# Patient Record
Sex: Female | Born: 1935 | Race: White | Hispanic: No | Marital: Married | Smoking: Former smoker
Health system: Southern US, Community
[De-identification: ages and names within clinical notes are randomized; demographics above are authoritative.]

## PROBLEM LIST (undated history)

## (undated) DIAGNOSIS — F32A Depression, unspecified: Secondary | ICD-10-CM

## (undated) DIAGNOSIS — M79606 Pain in leg, unspecified: Secondary | ICD-10-CM

## (undated) DIAGNOSIS — R600 Localized edema: Secondary | ICD-10-CM

## (undated) DIAGNOSIS — I5032 Chronic diastolic (congestive) heart failure: Secondary | ICD-10-CM

## (undated) DIAGNOSIS — E785 Hyperlipidemia, unspecified: Secondary | ICD-10-CM

## (undated) DIAGNOSIS — I739 Peripheral vascular disease, unspecified: Secondary | ICD-10-CM

## (undated) DIAGNOSIS — N183 Chronic kidney disease, stage 3 unspecified: Secondary | ICD-10-CM

## (undated) DIAGNOSIS — R159 Full incontinence of feces: Secondary | ICD-10-CM

## (undated) DIAGNOSIS — Z9889 Other specified postprocedural states: Secondary | ICD-10-CM

## (undated) DIAGNOSIS — Z8719 Personal history of other diseases of the digestive system: Secondary | ICD-10-CM

## (undated) DIAGNOSIS — F419 Anxiety disorder, unspecified: Secondary | ICD-10-CM

## (undated) DIAGNOSIS — Z01810 Encounter for preprocedural cardiovascular examination: Secondary | ICD-10-CM

## (undated) DIAGNOSIS — R55 Syncope and collapse: Secondary | ICD-10-CM

## (undated) DIAGNOSIS — N189 Chronic kidney disease, unspecified: Secondary | ICD-10-CM

## (undated) DIAGNOSIS — I10 Essential (primary) hypertension: Secondary | ICD-10-CM

## (undated) DIAGNOSIS — K219 Gastro-esophageal reflux disease without esophagitis: Secondary | ICD-10-CM

## (undated) DIAGNOSIS — R2689 Other abnormalities of gait and mobility: Secondary | ICD-10-CM

## (undated) DIAGNOSIS — I447 Left bundle-branch block, unspecified: Secondary | ICD-10-CM

## (undated) DIAGNOSIS — M549 Dorsalgia, unspecified: Secondary | ICD-10-CM

## (undated) DIAGNOSIS — F329 Major depressive disorder, single episode, unspecified: Secondary | ICD-10-CM

## (undated) DIAGNOSIS — Z96651 Presence of right artificial knee joint: Secondary | ICD-10-CM

## (undated) DIAGNOSIS — R911 Solitary pulmonary nodule: Secondary | ICD-10-CM

## (undated) DIAGNOSIS — R112 Nausea with vomiting, unspecified: Secondary | ICD-10-CM

## (undated) DIAGNOSIS — E669 Obesity, unspecified: Secondary | ICD-10-CM

## (undated) DIAGNOSIS — D069 Carcinoma in situ of cervix, unspecified: Secondary | ICD-10-CM

## (undated) DIAGNOSIS — M1990 Unspecified osteoarthritis, unspecified site: Secondary | ICD-10-CM

## (undated) DIAGNOSIS — S83209A Unspecified tear of unspecified meniscus, current injury, unspecified knee, initial encounter: Secondary | ICD-10-CM

## (undated) DIAGNOSIS — J189 Pneumonia, unspecified organism: Secondary | ICD-10-CM

## (undated) HISTORY — DX: Chronic kidney disease, unspecified: N18.9

## (undated) HISTORY — DX: Other abnormalities of gait and mobility: R26.89

## (undated) HISTORY — PX: TONSILLECTOMY: SUR1361

## (undated) HISTORY — DX: Chronic diastolic (congestive) heart failure: I50.32

## (undated) HISTORY — DX: Presence of right artificial knee joint: Z96.651

## (undated) HISTORY — DX: Pain in leg, unspecified: M79.606

## (undated) HISTORY — DX: Left bundle-branch block, unspecified: I44.7

## (undated) HISTORY — PX: CERVICAL CONE BIOPSY: SUR198

## (undated) HISTORY — DX: Depression, unspecified: F32.A

## (undated) HISTORY — PX: CATARACT EXTRACTION: SUR2

## (undated) HISTORY — DX: Obesity, unspecified: E66.9

## (undated) HISTORY — DX: Encounter for preprocedural cardiovascular examination: Z01.810

## (undated) HISTORY — DX: Hyperlipidemia, unspecified: E78.5

## (undated) HISTORY — DX: Syncope and collapse: R55

## (undated) HISTORY — DX: Dorsalgia, unspecified: M54.9

## (undated) HISTORY — DX: Other specified postprocedural states: Z98.890

## (undated) HISTORY — PX: THORACOTOMY: SUR1349

## (undated) HISTORY — DX: Major depressive disorder, single episode, unspecified: F32.9

## (undated) HISTORY — PX: GYNECOLOGIC CRYOSURGERY: SHX857

## (undated) HISTORY — DX: Chronic kidney disease, stage 3 (moderate): N18.3

## (undated) HISTORY — DX: Full incontinence of feces: R15.9

## (undated) HISTORY — DX: Carcinoma in situ of cervix, unspecified: D06.9

## (undated) HISTORY — DX: Essential (primary) hypertension: I10

## (undated) HISTORY — DX: Localized edema: R60.0

## (undated) HISTORY — DX: Anxiety disorder, unspecified: F41.9

## (undated) HISTORY — DX: Solitary pulmonary nodule: R91.1

## (undated) HISTORY — DX: Chronic kidney disease, stage 3 unspecified: N18.30

## (undated) HISTORY — PX: OTHER SURGICAL HISTORY: SHX169

---

## 1983-10-04 HISTORY — PX: VAGINAL HYSTERECTOMY: SUR661

## 1998-06-19 ENCOUNTER — Other Ambulatory Visit: Admission: RE | Admit: 1998-06-19 | Discharge: 1998-06-19 | Payer: Self-pay | Admitting: Obstetrics and Gynecology

## 1999-07-14 ENCOUNTER — Other Ambulatory Visit: Admission: RE | Admit: 1999-07-14 | Discharge: 1999-07-14 | Payer: Self-pay | Admitting: Obstetrics and Gynecology

## 2000-09-07 ENCOUNTER — Other Ambulatory Visit: Admission: RE | Admit: 2000-09-07 | Discharge: 2000-09-07 | Payer: Self-pay | Admitting: Obstetrics and Gynecology

## 2000-12-07 ENCOUNTER — Inpatient Hospital Stay (HOSPITAL_COMMUNITY): Admission: EM | Admit: 2000-12-07 | Discharge: 2000-12-09 | Payer: Self-pay | Admitting: Emergency Medicine

## 2000-12-07 ENCOUNTER — Encounter: Payer: Self-pay | Admitting: Emergency Medicine

## 2001-09-07 ENCOUNTER — Other Ambulatory Visit: Admission: RE | Admit: 2001-09-07 | Discharge: 2001-09-07 | Payer: Self-pay | Admitting: Obstetrics and Gynecology

## 2002-09-06 ENCOUNTER — Other Ambulatory Visit: Admission: RE | Admit: 2002-09-06 | Discharge: 2002-09-06 | Payer: Self-pay | Admitting: Obstetrics and Gynecology

## 2003-02-22 ENCOUNTER — Encounter: Payer: Self-pay | Admitting: Neurology

## 2003-02-22 ENCOUNTER — Ambulatory Visit (HOSPITAL_COMMUNITY): Admission: RE | Admit: 2003-02-22 | Discharge: 2003-02-22 | Payer: Self-pay | Admitting: Neurology

## 2004-10-03 HISTORY — PX: CARDIAC CATHETERIZATION: SHX172

## 2004-10-19 ENCOUNTER — Other Ambulatory Visit: Admission: RE | Admit: 2004-10-19 | Discharge: 2004-10-19 | Payer: Self-pay | Admitting: Obstetrics and Gynecology

## 2004-11-18 ENCOUNTER — Inpatient Hospital Stay (HOSPITAL_COMMUNITY): Admission: EM | Admit: 2004-11-18 | Discharge: 2004-11-22 | Payer: Self-pay | Admitting: *Deleted

## 2005-02-01 ENCOUNTER — Ambulatory Visit (HOSPITAL_COMMUNITY): Admission: RE | Admit: 2005-02-01 | Discharge: 2005-02-01 | Payer: Self-pay | Admitting: Cardiology

## 2005-03-21 ENCOUNTER — Ambulatory Visit (HOSPITAL_COMMUNITY): Admission: RE | Admit: 2005-03-21 | Discharge: 2005-03-21 | Payer: Self-pay | Admitting: Cardiology

## 2005-03-23 ENCOUNTER — Inpatient Hospital Stay (HOSPITAL_BASED_OUTPATIENT_CLINIC_OR_DEPARTMENT_OTHER): Admission: RE | Admit: 2005-03-23 | Discharge: 2005-03-23 | Payer: Self-pay | Admitting: Cardiology

## 2005-04-19 ENCOUNTER — Encounter (INDEPENDENT_AMBULATORY_CARE_PROVIDER_SITE_OTHER): Payer: Self-pay | Admitting: Specialist

## 2005-04-19 ENCOUNTER — Inpatient Hospital Stay (HOSPITAL_COMMUNITY): Admission: RE | Admit: 2005-04-19 | Discharge: 2005-04-23 | Payer: Self-pay | Admitting: Thoracic Surgery

## 2005-05-03 ENCOUNTER — Encounter: Admission: RE | Admit: 2005-05-03 | Discharge: 2005-05-03 | Payer: Self-pay | Admitting: Thoracic Surgery

## 2005-05-31 ENCOUNTER — Encounter: Admission: RE | Admit: 2005-05-31 | Discharge: 2005-05-31 | Payer: Self-pay | Admitting: Thoracic Surgery

## 2005-07-20 ENCOUNTER — Encounter: Admission: RE | Admit: 2005-07-20 | Discharge: 2005-07-20 | Payer: Self-pay | Admitting: Thoracic Surgery

## 2005-10-20 ENCOUNTER — Other Ambulatory Visit: Admission: RE | Admit: 2005-10-20 | Discharge: 2005-10-20 | Payer: Self-pay | Admitting: Obstetrics and Gynecology

## 2006-03-28 ENCOUNTER — Ambulatory Visit (HOSPITAL_COMMUNITY): Admission: RE | Admit: 2006-03-28 | Discharge: 2006-03-28 | Payer: Self-pay | Admitting: Cardiology

## 2007-12-05 ENCOUNTER — Other Ambulatory Visit: Admission: RE | Admit: 2007-12-05 | Discharge: 2007-12-05 | Payer: Self-pay | Admitting: Obstetrics and Gynecology

## 2009-01-27 ENCOUNTER — Other Ambulatory Visit: Admission: RE | Admit: 2009-01-27 | Discharge: 2009-01-27 | Payer: Self-pay | Admitting: Obstetrics and Gynecology

## 2009-01-27 ENCOUNTER — Ambulatory Visit: Payer: Self-pay | Admitting: Obstetrics and Gynecology

## 2009-01-27 ENCOUNTER — Encounter: Payer: Self-pay | Admitting: Obstetrics and Gynecology

## 2009-02-26 ENCOUNTER — Ambulatory Visit: Payer: Self-pay | Admitting: Gynecology

## 2009-09-23 ENCOUNTER — Encounter: Admission: RE | Admit: 2009-09-23 | Discharge: 2009-09-23 | Payer: Self-pay | Admitting: Neurosurgery

## 2010-02-08 ENCOUNTER — Ambulatory Visit: Payer: Self-pay | Admitting: Obstetrics and Gynecology

## 2010-02-08 ENCOUNTER — Other Ambulatory Visit: Admission: RE | Admit: 2010-02-08 | Discharge: 2010-02-08 | Payer: Self-pay | Admitting: Obstetrics and Gynecology

## 2010-03-10 ENCOUNTER — Encounter: Admission: RE | Admit: 2010-03-10 | Discharge: 2010-03-10 | Payer: Self-pay | Admitting: Neurosurgery

## 2010-04-29 ENCOUNTER — Ambulatory Visit: Payer: Self-pay | Admitting: Vascular Surgery

## 2010-10-14 ENCOUNTER — Ambulatory Visit: Payer: Self-pay | Admitting: Cardiology

## 2010-10-19 ENCOUNTER — Ambulatory Visit: Payer: Self-pay | Admitting: Cardiology

## 2010-10-25 ENCOUNTER — Ambulatory Visit: Payer: Self-pay | Admitting: Cardiology

## 2010-11-02 ENCOUNTER — Ambulatory Visit: Payer: Self-pay | Admitting: Cardiology

## 2010-11-22 ENCOUNTER — Ambulatory Visit (INDEPENDENT_AMBULATORY_CARE_PROVIDER_SITE_OTHER): Payer: Medicare Other | Admitting: Cardiology

## 2010-11-22 DIAGNOSIS — I10 Essential (primary) hypertension: Secondary | ICD-10-CM

## 2010-12-06 ENCOUNTER — Ambulatory Visit (INDEPENDENT_AMBULATORY_CARE_PROVIDER_SITE_OTHER): Payer: Medicare Other | Admitting: Cardiology

## 2010-12-06 DIAGNOSIS — E119 Type 2 diabetes mellitus without complications: Secondary | ICD-10-CM

## 2010-12-06 DIAGNOSIS — I10 Essential (primary) hypertension: Secondary | ICD-10-CM

## 2010-12-17 ENCOUNTER — Other Ambulatory Visit: Payer: Self-pay | Admitting: Family Medicine

## 2010-12-17 DIAGNOSIS — I10 Essential (primary) hypertension: Secondary | ICD-10-CM

## 2010-12-20 ENCOUNTER — Ambulatory Visit (INDEPENDENT_AMBULATORY_CARE_PROVIDER_SITE_OTHER): Payer: Medicare Other | Admitting: Cardiology

## 2010-12-20 DIAGNOSIS — I10 Essential (primary) hypertension: Secondary | ICD-10-CM

## 2010-12-23 ENCOUNTER — Telehealth: Payer: Self-pay | Admitting: *Deleted

## 2010-12-23 ENCOUNTER — Other Ambulatory Visit: Payer: PRIVATE HEALTH INSURANCE

## 2010-12-23 NOTE — Telephone Encounter (Signed)
Pt called and left message for RN on whether any of her meds that Dr. Deborah Chalk prescribed could raise blood sugar.  Dr. Deborah Chalk notified and instructed RN that the medications he prescribed should not increase blood sugar significantly.  RN left voicemail for pt at home number with Dr. Ronnald Nian instructions.

## 2010-12-31 ENCOUNTER — Telehealth: Payer: Self-pay | Admitting: Cardiology

## 2010-12-31 NOTE — Telephone Encounter (Signed)
HAS QUESTIONS ABOUT BP MEDS.

## 2011-01-03 ENCOUNTER — Telehealth: Payer: Self-pay | Admitting: *Deleted

## 2011-01-03 NOTE — Telephone Encounter (Signed)
L/M for pt to call back regarding her questions about her blood pressure

## 2011-01-03 NOTE — Telephone Encounter (Signed)
Pt is c/o feeling weak and tired around noon every day.  BP today at noon 165/62.  Pt feels great in the AM.  BP readings,  129/56 AM, 179/56 PM, 156/61 AM, 179/63 PM, 145/61 AM, 144/58 PM, 148/62 AM, 188/70 PM.  Pt takes Amlodipine 5 mg in am, Altace 10 mg BID, Furosemide 20 mg in AM, Losartan 50 mg BID, Bystolic 10 mg in PM and Tenex 2 mg in PM.  Pt would like to know what to do.

## 2011-01-04 ENCOUNTER — Telehealth: Payer: Self-pay | Admitting: *Deleted

## 2011-01-04 NOTE — Telephone Encounter (Signed)
Left message for pt to continue same medications for now per Dr. Ronnald Nian instructions.  Pt has office visit next week.

## 2011-01-04 NOTE — Telephone Encounter (Deleted)
Left message for pt to continue same medications and to call back with any concerns.

## 2011-01-04 NOTE — Telephone Encounter (Signed)
Continue for now

## 2011-01-06 ENCOUNTER — Encounter: Payer: Self-pay | Admitting: Cardiology

## 2011-01-06 DIAGNOSIS — E1121 Type 2 diabetes mellitus with diabetic nephropathy: Secondary | ICD-10-CM | POA: Insufficient documentation

## 2011-01-06 DIAGNOSIS — F419 Anxiety disorder, unspecified: Secondary | ICD-10-CM | POA: Insufficient documentation

## 2011-01-06 DIAGNOSIS — R2689 Other abnormalities of gait and mobility: Secondary | ICD-10-CM | POA: Insufficient documentation

## 2011-01-06 DIAGNOSIS — F329 Major depressive disorder, single episode, unspecified: Secondary | ICD-10-CM | POA: Insufficient documentation

## 2011-01-06 DIAGNOSIS — I447 Left bundle-branch block, unspecified: Secondary | ICD-10-CM | POA: Insufficient documentation

## 2011-01-06 DIAGNOSIS — R55 Syncope and collapse: Secondary | ICD-10-CM | POA: Insufficient documentation

## 2011-01-06 DIAGNOSIS — R600 Localized edema: Secondary | ICD-10-CM | POA: Insufficient documentation

## 2011-01-06 DIAGNOSIS — E669 Obesity, unspecified: Secondary | ICD-10-CM | POA: Insufficient documentation

## 2011-01-10 ENCOUNTER — Ambulatory Visit (INDEPENDENT_AMBULATORY_CARE_PROVIDER_SITE_OTHER): Payer: Medicare Other | Admitting: Cardiology

## 2011-01-10 ENCOUNTER — Encounter: Payer: Self-pay | Admitting: Cardiology

## 2011-01-10 DIAGNOSIS — E119 Type 2 diabetes mellitus without complications: Secondary | ICD-10-CM

## 2011-01-10 DIAGNOSIS — I10 Essential (primary) hypertension: Secondary | ICD-10-CM | POA: Insufficient documentation

## 2011-01-10 NOTE — Progress Notes (Signed)
Subjective:   DD seen today for followup of her hypertension. She didn't originally bring her blood pressure readings and, but they remain somewhat labile. Last night her blood pressure readings were elevated but today they're excellent. Overall, she feels well without significant symptoms.  She's having very tight control of her blood sugars. Blood sugars last night were 68. I'm concerned that her blood sugars may be low enough to induce adrenaline surges. Her hemoglobin A1c is 5.3.  Current Outpatient Prescriptions  Medication Sig Dispense Refill  . amLODipine (NORVASC) 5 MG tablet Take 5 mg by mouth daily.        . citalopram (CELEXA) 20 MG tablet Take 20 mg by mouth daily.        . clonazePAM (KLONOPIN) 0.5 MG tablet Take 0.5 mg by mouth 2 (two) times daily as needed. 1/2 IN THE AM, AND 1 IN THE PM       . Estropipate (OGEN 1.25 PO) Take by mouth daily.        . furosemide (LASIX) 20 MG tablet Take 20 mg by mouth 2 (two) times daily.        Marland Kitchen glimepiride (AMARYL) 2 MG tablet Take 2 mg by mouth daily before breakfast. 1/2 DAILY       . guanFACINE (TENEX) 1 MG tablet Take 2 mg by mouth at bedtime.        Marland Kitchen losartan (COZAAR) 50 MG tablet Take 50 mg by mouth 2 (two) times daily.        . Multiple Vitamin (MULTIVITAMIN) tablet Take 1 tablet by mouth daily.        . nebivolol (BYSTOLIC) 10 MG tablet Take 10 mg by mouth daily.        . pioglitazone (ACTOS) 45 MG tablet Take 45 mg by mouth daily.        . ramipril (ALTACE) 10 MG tablet Take 10 mg by mouth 2 (two) times daily.        . traZODone (DESYREL) 50 MG tablet Take 50 mg by mouth at bedtime.          Allergies  Allergen Reactions  . Ciprofloxacin   . Codeine   . Darvocet (Propoxyphene N-Acetaminophen)   . Darvon   . Hydralazine   . Hydrocodone Nausea And Vomiting  . Morphine And Related   . Novocain   . Sulfa Drugs Cross Reactors   . Talwin     Patient Active Problem List  Diagnoses  . Balance problems  . Diabetes mellitus    . Syncope and collapse  . Left bundle branch block  . Renal insufficiency  . Lower extremity edema  . Depression  . Anxiety  . Obesity    History  Smoking status  . Former Smoker  . Types: Cigarettes  . Quit date: 11/03/1984  Smokeless tobacco  . Not on file    History  Alcohol Use No    Family History  Problem Relation Age of Onset  . Alzheimer's disease Mother   . Alzheimer's disease Father     Review of Systems:   The patient denies any heat or cold intolerance.  No weight gain or weight loss.  The patient denies headaches or blurry vision.  There is no cough or sputum production.  The patient denies dizziness.  There is no hematuria or hematochezia.  The patient denies any muscle aches or arthritis.  The patient denies any rash.  The patient denies frequent falling or instability.  There is no history  of depression or anxiety.  All other systems were reviewed and are negative.   Physical Exam:   Weight is 175. Blood pressure 130/68 sitting heart rate 58.The head is normocephalic and atraumatic.  Pupils are equally round and reactive to light.  Sclerae nonicteric.  Conjunctiva is clear.  Oropharynx is unremarkable.  There's adequate oral airway.  Neck is supple there are no masses.  Thyroid is not enlarged.  There is no lymphadenopathy.  Lungs are clear.  Chest is symmetric.  Heart shows a regular rate and rhythm.  S1 and S2 are normal.  There is no murmur click or gallop.  Abdomen is soft normal bowel sounds.  There is no organomegaly.  Genital and rectal deferred.  Extremities are without edema.  Peripheral pulses are adequate.  Neurologically intact.  Full range of motion.  The patient is not depressed.  Skin is warm and dry.  Assessment / Plan:

## 2011-01-21 ENCOUNTER — Telehealth: Payer: Self-pay | Admitting: Cardiology

## 2011-01-21 ENCOUNTER — Other Ambulatory Visit: Payer: Self-pay | Admitting: Family Medicine

## 2011-01-21 DIAGNOSIS — K3189 Other diseases of stomach and duodenum: Secondary | ICD-10-CM

## 2011-01-21 DIAGNOSIS — R1013 Epigastric pain: Secondary | ICD-10-CM

## 2011-01-21 NOTE — Telephone Encounter (Signed)
RESULTS OF RENAL DOPPLER IS OK, SHE JUST WANTED YOU TO KNOW

## 2011-01-25 ENCOUNTER — Ambulatory Visit
Admission: RE | Admit: 2011-01-25 | Discharge: 2011-01-25 | Disposition: A | Discharge status: Home / Self Care | Payer: Medicare Other | Source: Ambulatory Visit | Attending: Family Medicine | Admitting: Family Medicine

## 2011-01-25 DIAGNOSIS — R1013 Epigastric pain: Secondary | ICD-10-CM

## 2011-02-07 ENCOUNTER — Telehealth: Payer: Self-pay | Admitting: Cardiology

## 2011-02-07 ENCOUNTER — Telehealth: Payer: Self-pay | Admitting: *Deleted

## 2011-02-07 NOTE — Telephone Encounter (Signed)
Samples of Bystolic provided

## 2011-02-07 NOTE — Telephone Encounter (Signed)
Pt called said she need samples of Bystolic please call and let her know if we have some she is almost out

## 2011-02-11 ENCOUNTER — Encounter: Payer: Self-pay | Admitting: Cardiology

## 2011-02-11 ENCOUNTER — Ambulatory Visit (INDEPENDENT_AMBULATORY_CARE_PROVIDER_SITE_OTHER): Payer: Medicare Other | Admitting: Cardiology

## 2011-02-11 DIAGNOSIS — I10 Essential (primary) hypertension: Secondary | ICD-10-CM

## 2011-02-11 NOTE — Progress Notes (Signed)
Subjective:   She had renal ultrasound which was negative for renovascular hypertension. This is a second negative renal ultrasound. Blood pressure readings have been elevated in the evenings. At times she'll have satisfactory readings. Overall, she's doing reasonably well but has occasional headache and shortness of breath on exertion. She's not had recurrent lower extremity edema. She previously was on higher dose of Norvasc and developed lower extremity edema and now she's wearing support stockings. She sees Dr. Duane Lope for primary care. We are trying to locate a 24-hour blood pressure monitor and see if her actual home readings are satisfactory. She has reasonable blood pressure readings generally in the office.  Current Outpatient Prescriptions  Medication Sig Dispense Refill  . amLODipine (NORVASC) 5 MG tablet Take 5 mg by mouth daily.        . citalopram (CELEXA) 20 MG tablet Take 20 mg by mouth daily.        . clonazePAM (KLONOPIN) 0.5 MG tablet Take 0.5 mg by mouth 2 (two) times daily as needed. 1/2 IN THE AM, AND 1 IN THE PM       . Estropipate (OGEN 1.25 PO) Take by mouth daily.        . furosemide (LASIX) 20 MG tablet Take 20 mg by mouth 2 (two) times daily.        Marland Kitchen glimepiride (AMARYL) 2 MG tablet Take 2 mg by mouth daily before breakfast. 1/2 DAILY       . guanFACINE (TENEX) 1 MG tablet Take 2 mg by mouth at bedtime.        Marland Kitchen losartan (COZAAR) 50 MG tablet Take 50 mg by mouth 2 (two) times daily.        . Multiple Vitamin (MULTIVITAMIN) tablet Take 1 tablet by mouth daily.        . nebivolol (BYSTOLIC) 10 MG tablet Take 10 mg by mouth daily.        . pioglitazone (ACTOS) 45 MG tablet Take 45 mg by mouth daily.        . ramipril (ALTACE) 10 MG tablet Take 10 mg by mouth 2 (two) times daily.        . traZODone (DESYREL) 50 MG tablet Take 50 mg by mouth at bedtime.          Allergies  Allergen Reactions  . Ciprofloxacin   . Codeine   . Darvocet (Propoxyphene N-Acetaminophen)     . Darvon   . Hydralazine   . Hydrocodone Nausea And Vomiting  . Morphine And Related   . Novocain   . Sulfa Drugs Cross Reactors   . Talwin     Patient Active Problem List  Diagnoses  . Balance problems  . Diabetes mellitus  . Syncope and collapse  . Left bundle branch block  . Renal insufficiency  . Lower extremity edema  . Depression  . Anxiety  . Obesity  . HTN (hypertension)    History  Smoking status  . Former Smoker  . Types: Cigarettes  . Quit date: 11/03/1984  Smokeless tobacco  . Not on file    History  Alcohol Use No    Family History  Problem Relation Age of Onset  . Alzheimer's disease Mother   . Alzheimer's disease Father     Review of Systems:   The patient denies any heat or cold intolerance.  No weight gain or weight loss.  The patient denies headaches or blurry vision.  There is no cough or sputum production.  The  patient denies dizziness.  There is no hematuria or hematochezia.  The patient denies any muscle aches or arthritis.  The patient denies any rash.  The patient denies frequent falling or instability.  There is no history of depression or anxiety.  All other systems were reviewed and are negative.   Physical Exam:   Her blood pressure is 152/82 sitting, heart rate 56.The head is normocephalic and atraumatic.  Pupils are equally round and reactive to light.  Sclerae nonicteric.  Conjunctiva is clear.  Oropharynx is unremarkable.  There's adequate oral airway.  Neck is supple there are no masses.  Thyroid is not enlarged.  There is no lymphadenopathy.  Lungs are clear.  Chest is symmetric.  Heart shows a regular rate and rhythm.  S1 and S2 are normal.  There is no murmur click or gallop.  Abdomen is soft normal bowel sounds.  There is no organomegaly.  Genital and rectal deferred.  Extremities are without edema.  Peripheral pulses are adequate.  Neurologically intact.  Full range of motion.  The patient is not depressed.  Skin is warm and  dry. Assessment / Plan:

## 2011-02-11 NOTE — Assessment & Plan Note (Signed)
We'll increase Norvasc to 5 mg b.i.d. And increase Bystolic tube 10 mg b.i.d. We'll try to get her to wear a 24-hour blood pressure monitor. We'll see her again in 2-3 weeks. Thereafter, we'll need to refer to Dr. Marca Ancona for cardiology followup.

## 2011-02-15 NOTE — Consult Note (Signed)
NEW PATIENT CONSULTATION   ZEYA, BALLES  DOB:  [birth year]                                       04/29/2010  ZOXWR#:[id number]   I saw the patient in the office today in consultation concerning her  aortoiliac disease.  This is a pleasant 75 year old woman who 4 months  ago began developing significant weakness in both lower extremities  which was quite disabling.  Part of her workup for this included a  lumbar myelogram and CT of the lumbar spine.  I reviewed the study which  does show degenerative spondylolisthesis at L4-L5.  There is not noted  to be significant narrowing at this level.  An incidental finding  however was that she had significant calcification of her aortoiliac  arteries.  She was sent for vascular consultation.  She states that  ultimately she discontinued her statins about a month ago and several  days later her symptoms in her legs rapidly improved.  She is no longer  having significant weakness in her legs except when she has been  standing for a prolonged period of time.  She does describe some very  mild calf claudication but this occurs at a variable distance.  She has  had no rest pain and no history of nonhealing ulcers.  Her only other  complaint has been some swelling in her ankles recently when she is on  her feel a lot.   Her past medical history is significant for diabetes and hypertension.  She denies any history of previous myocardial infarction, history of  congestive heart failure or history of COPD.   SOCIAL HISTORY:  She is married.  She has three children.  She quit  tobacco 25 years ago.   FAMILY HISTORY:  There is no history of premature cardiovascular  disease.   REVIEW OF SYSTEMS:  GENERAL:  She has had no recent weight loss, weight  gain or problems with her appetite.  CARDIOVASCULAR:  She has had no chest pain, chest pressure, palpitations  or arrhythmias.  She does admit to dyspnea on exertion.  She has had  no  history of stroke, TIAs or amaurosis fugax.  She has had no history of  DVT or phlebitis.  GU:  She does have urinary frequency.  MUSCULOSKELETAL:  She does have arthritis, joint pain and muscle pain.  PSYCHIATRIC:  She has had anxiety and panic disorders in the past.  GI, neurologic, pulmonary, hematologic, ENT, integumentary review of  systems is unremarkable and is documented on the medical history form in  her chart.   PHYSICAL EXAMINATION:  General:  This is a pleasant 75 year old woman  who appears her stated age.  Vital signs:  Blood pressure is 160/78,  heart rate is 66, saturation is 98%.  HEENT:  Unremarkable.  Lungs:  Are  clear bilaterally to auscultation without rales, rhonchi or wheezing.  Cardiovascular:  I do not detect any carotid bruits.  She has a regular  rate and rhythm.  She has palpable femoral, popliteal, dorsalis pedis  and posterior tibial pulses bilaterally.  Abdomen:  Soft and nontender.  No aneurysm is appreciated.  She has normal pitched bowel sounds.  Musculoskeletal:  There are no major deformities or cyanosis.  Neurological:  She has no focal weakness or paresthesias.  Skin:  There  are no ulcers or rashes.   I  did order and independently interpret her arterial Doppler studies  today which shows an ABI of 98% on the right and 100% on the left.  She  has triphasic Doppler signals in the posterior tibial and dorsalis pedis  positions bilaterally.   I reassured her that she has no evidence of significant occlusive  disease.  I think she simply has calcified aortoiliac arteries but no  focal stenosis.  She has normal ABIs, normal Doppler signals and good  pulses.  I reassured her that this was not something to be alarmed about  and to resume her workouts at the gym and stay as active as possible.  I  would be happy to see her at any time in the future if any new vascular  issues arise.     Di Kindle. Edilia Bo, M.D.  Electronically Signed    CSD/MEDQ  D:  04/29/2010  T:  04/30/2010  Job:  [id number]   cc:   Sherilyn Cooter A. Jordan Likes, M.D.  Vianne Bulls, M.D.

## 2011-02-18 NOTE — Discharge Summary (Signed)
Doris Carr, GILBERTO NO.:  [id number]   MEDICAL RECORD NO.:  [id number]          PATIENT TYPE:  INP   LOCATION:  2029                         FACILITY:  [facility]   PHYSICIAN:  Ines Bloomer, M.D. DATE OF BIRTH:  Dec 20, 1935   DATE OF ADMISSION:  04/19/2005  DATE OF DISCHARGE:  04/23/2005                                 DISCHARGE SUMMARY   PRIMARY ADMITTING DIAGNOSIS:  Right chest mass.   ADDITIONAL/DISCHARGE DIAGNOSES:  1.  Epicardial cyst,  benign adipose tissue.  2.  Mild renal insufficiency.  3.  Diabetes mellitus.  4.  Chronic peripheral edema.  5.  Depression.  6.  Hypertension.  7.  Anxiety.  8.  Recent syncopal episode.   PROCEDURES PERFORMED:  Right VATS, right median thoracotomy with resection  of epicardial mass.   HISTORY:  The patient is a 75 year old female with a history of multiple  medical problems who recently had a syncopal episode as well as some  dizziness.  In the course of her workup, a CT scan was performed which  showed a 17 x 10 cm nodule in the epicardial fat on the right.  A CT scan  was repeated in June and this showed an increase in the size of the mass to  22 cm.  There was also a question of some plaque-like thickening posteriorly  in the pleural and hemangioma in the liver.  The patient has had episodic  chest pain and has a history of chronic left bundle branch block.  She has  been evaluated by Dr. Roger Shelter has apparently undergone recent  catheterization which was normal.  Because of the CT findings, she was  referred to Dr. Dewayne Shorter for further evaluation and treatment.  It was Dr.  Scheryl Darter opinion that she should undergo a VATS at this time with resection  of the lesion for diagnosis.   HOSPITAL COURSE:  She was admitted to [facility] on April 19, 2005.  She was taken to the operating room where she underwent right VATS with  resection of epicardial mass.  She tolerated the procedure well and was  transferred to the floor in stable condition.  Her pathology revealed benign  adipose tissue.  Postoperatively she has done fairly well.  Her only  postoperative difficulty has been some urinary retention which required  replacement of her Foley catheter.  At the present time her catheter has  been removed and she is voiding without difficulty.  She also had some  respiratory problems and required supplemental oxygen.  At the present time  she is has been weaned completely from supplemental oxygen and is  maintaining O2 sats of greater than 90% on room air.  She has otherwise done  well.  Her chest tubes have been removed in a step-wise fashion.  She has  been treated with aggressive pulmonary toilet measures.  She has been  restarted on all her home medications.  She is ambulating in the halls  without difficulty.  She has remained afebrile and all vital signs have been  stable.  Her  most recent labs show a hemoglobin of 11.7, hematocrit 33.9,  white count 11.8, platelets 148, sodium 135, potassium 3.7, BUN 21,  creatinine 1.0.  her chest x-rays have remained stable with mild atelectasis  and no pneumothorax.  Her surgical incision sites are all healing well.  She  is tolerating a regular diet and is having normal bowel or bladder function.  If is felt if she continues to remain stable over the next 24 hours,she will  hopefully be ready for discharge home.   DISCHARGE MEDICATIONS:  1.  Ultram 50-100 mg q.4-6h. p.r.n. for pain.  2.  Ogen 1.5 mg daily.  3.  Tricor 145 mg daily.  4.  Altace 10 mg daily.  5.  Glimepiride 2 mg daily.  6.  Aspirin 81 mg daily.  7.  Klonopin 0.5 mg b.i.d.  8.  Flexeril 10 mg b.i.d.  9.  Actos 45 mg daily.  10. Trazodone 50 mg q.h.s.  11. Lexapro 20 mg q.h.s.  12. Norvasc 10 mg q.h.s.   DISCHARGE INSTRUCTIONS:  She is asked to refrain from driving, heavy lifting  or strenuous activity.  She may continue ambulating daily and using her  incentive  spirometer.  She may shower daily and clean her incisions with  soap and water.  She will continue her same preoperative diet.   DISCHARGE FOLLOWUP:  She will Dr. Edwyna Shell back in the office in one week with  a chest x-ray.  She will call our office in the interim if she experiences  any problems or has questions.      Gi   GC/MEDQ  D:  04/22/2005  T:  04/23/2005  Job:  [id number]   cc:   Miguel Aschoff, M.D.  [address], [address]  [place]  [place] [address]  Fax: [phone]   Colleen Can. Deborah Chalk, M.D.  Fax: [phone]

## 2011-02-18 NOTE — H&P (Signed)
[place]  Patient:    Doris Carr, Doris Carr                           MRN: [id number] Adm. Date:  [id number] Attending:  Anastasio Auerbach CC:         Jamesetta Geralds, M.D.             Fritzi Mandes, M.D.             Dub Amis, M.D.             Daniel L. Eda Paschal, M.D.             Jimmye Norman, M.D.                         History and Physical  DATE OF BIRTH:  [birth year].  CHIEF COMPLAINT:  "My stomach hurts."  HISTORY OF PRESENT ILLNESS:  Doris Carr is a 75 year old Caucasian female with a history of hypertension and diabetes who presents with a 3-day history of abdominal pain and vomiting.  She is seen initially in the clinic and found to have a sitting blood pressure of 126/66, and a standing pressure of 99/58. She is visibly dehydrated and referred to the hospital for further evaluation.  In the emergency department she is still having significant pain. No vomiting. She does describe diarrhea over the past 2-3 days as well.  She has felt chilled but without documented fevers.  She has not been able to take any of her medications or take any food or fluids.  Her urine output has decreased.  She has a history of left inguinal hernia surgery as well as a vaginal hysterectomy.  In December of last year she had a similar but milder symptoms that resolved without hospitalization.  She was placed on medication for irritable bowel since then.  There was some confusion and it looks as though she may have been placed on clindamycin instead of Librax.  She has been off that medication since November 08, 2000.  PAST MEDICAL HISTORY:  1. Hypertension.  2. Diabetes mellitus type 2 (onset 6 years ago).     a. Good control over the past 2 years.  3. Anxiety disorder with possible depression.     a. Chronic benzodiazepine use.     b. Followed by Dr. Jodi Marble.  4. Chronic lower extremity edema, left greater than right.     a. Venous Dopplers 3 weeks ago, though  DVT.     b. Aggravated by Vioxx and Norvasc (Vioxx dose subsequently reduced).  5. Post menopausal.     a. Vaginal hysterectomy several years ago.  6. Chronic lower extremity pain.     a. Controlled with neurontin.  7. Dyslipidemia.  CURRENT MEDICATIONS:  1. Actos 30 mg q.a.m.  2. Altace 10 mg q.d.  3. Amaryl 4 mg q.a.m.  4. Ogen 1.25 q.d.  5. Prilosec 20 mg p.o. q.d. p.r.n.  6. Xanax 1 mg p.o. b.i.d.  7. Lasix 20 mg p.o. b.i.d.  8. Ativan 1 mg half a tab p.r.n. anxiety (She has not taken this for several     weeks).  9. Maxzide 1 q. daily at noon. 10. Neurontin 300 mg p.o. b.i.d. (can take t.i.d. if needed). 11. Norvasc 10 mg q.d. 12. Vioxx 25 mg q.d. 13. Niacin 500 mg p.o. b.i.d. (she tells me it is the nonslushing kind). 14. Aspirin  325 mg q.d. 15. Soma p.r.n. 16. Welchol q.d. (the patient has been on this for 2 months). 17. Lipitor was stopped because of symptoms of urinary retention.  ALLERGIES:  1. CIPRO (throat swelling).  2. SULFA (vomiting)  3. CODEINE.  4. ______ (she tells me all vasoconstrictors cause her to pass out).     *She has tolerated Dilaudid in the past.  SOCIAL HISTORY:  Doris Carr is married and lives in [place].  She has 3 children and 4 grandchildren.  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  She is a nonsmoker and a nondrinker.  FAMILY HISTORY:  Her mother has hypertension.  No other significant medical family history.  REVIEW OF SYSTEMS:  Doris Carr wears glasses.  She has had no double vision. No difficulty swallowing.  She is currently experiencing some heartburn and acid reflux.  She has not vomited up any blood.  Prior to this she has not demonstrated any weight loss or blood in her stools.  Prior to this illness she was walking 2 miles a day for exercise and experiencing no chest pain or significant dyspnea on exertion.  She has no history of heart problems.  No history of lung problems.   Abdominal history as noted above.  No difficulty urinating once the lipitor was changed.  She does suffer from lower extremity edema.  Right now her legs are not edematous but the right lower extremity is slightly larger than the left which she tells me is chronic.  Three weeks ago she was having more edema and they actually did lower extremity Dopplers which were negative for DVT.  They felt it was secondary to the Norvasc as well as the b.i.d. vioxx.  They have reduced her Vioxx to once daily and her symptoms have improved.  She has chronic leg muscle pains.  She has no history of inflammatory joint disease or thyroid disease.  REVIEW OF SYSTEMS:  Except noted above and in HPI and past medical history is negative.  PHYSICAL EXAMINATION:  GENERAL:   Doris Carr is a well-nourished female looking younger than her stated age but in visible distress from her abdominal pain.  VITAL SIGNS:  Temperature 97.1, blood pressure 141/77, heart rate 78, respiratory rate 20, oxygen saturations 98% on room air.  HEENT:  Pupils, equal, round, reactive to light.  Extraocular muscles intact. Atraumatic, normocephalic.  Cranial nerves II-XII grossly intact.  Fundi appear unremarkable.  Sclerae are clear.  Conjunctivae are pink.  Mucous membranes are extremely dry.  Pharynx is clear.  NECK:  Supple.  No adenopathy.  No mass.  No bruits.  No JVD.  LUNGS:  Good air movement bilaterally with no evidence of crackles, wheeze or rhonchi.  HEART:  Regular.  Normal S1, S2.  Soft, systolic ejection murmur, heard best at the left sternal border, without significant radiation.  PULSES:  Pulses are 2+ in all extremities bilaterally.  ABDOMEN:  High pitched bowel sounds with evidence of distension.  No fluid waves.  Tender in the upper abdominal regions primarily without peritoneal signs.  Unable to assess organomegaly given patients significant tenderness  with deep palpation.  EXTREMITIES:  No cyanosis,  no clubbing, no rash.  The right lower extremity is slightly larger than the left but there is no pitting edema.  No palpable cords.  She is having pain at the IV site on the left when the potassium was running.  There is no evidence of  infiltration of the IV or local irritation.  NEUROLOGIC:  Cranial nerves noted above.  Motor exam reveals equal strength bilaterally.  Sensation grossly intact.  No significant neuropathy detected. Toes are downgoing.  Reflexes 2+ bilaterally.  Cerebellar function normal. Gait not assessed.  Acute abdominal series:  Chest x-ray clear.  Partial high grade small bowel obstruction.  There is air in the rectum as well as in the left colon with distended loops of small bowel and air fluid levels.  LABORATORY DATA:  Hemoglobin 13.9, MCV 92, WBC 7,300, ANC 5700.  URINALYSIS:  Clear.  Specific gravity 1.016, 15 ketones, otherwise negative.  Sodium 136, potassium 2.7, chloride 88, bicarbonate 36, glucose 134, BUN 45, creatinine 1.5, calcium 9.3, albumin 3.6, SGOT 23, SGPT 19, alk. phos. 59, total bilirubin 1.1.  Amylase 24, lipase 21.  IMPRESSION:  1. Partial high grade small bowel obstruction.  Mrs. Kaffenberger presents with a 3     day history of abdominal pain and intractable vomiting.  She has no     history of upper abdominal surgeries but still I suspect the most likely     etiology to be adhesions and scar tissue.  Dr. Lindie Spruce has seen the patient     in consultation.  We will place a nasogastric tube to see if we can     decompress the bowel and giver her some relief.  If there is no     improvement within 24 hours she will need surgery.  At this point she is     afebrile with a normal white blood cell count and watchful waiting is     appropriate.  2. Dehydration.  Mrs. Haxton was orthostatic by blood pressure in the office.     Clinical exam reveals dry mucous membranes and tenting of the skin.  She     has mildly elevated BUN and creatinine (see above) and  decreased urine     outputs.  She also has hypokalemia and a contraction alkalosis as a result     of her dehydration.  We will aggressively hydrate her and replace her     potassium.  She is on a diuretic so I will check the magnesium as well.  3. Type 2 diabetes mellitus.  Mrs. Eisenmenger has no history of DKA, and was     diagnosed with adult onset diabetes 6 years ago.  Over the last 2 years     her sugars have been under good control with values in the 85 to 120     range.  Her glucose here is only 134 but she does demonstrate ketones in     the urine demonstrating glucose deficiency.  We will give her D-5 IV     fluids and the sliding-scale insulin for coverage.  Capillary blood     glucose will be checked q.6h.  4. Anxiety disorder.  Mrs. Buhl follows with Dr. Jodi Marble.  She is on Xanax     chronically.  In order to avoid any kind of withdrawal we will place her     on Ativan IV q.12h. with p.r.n. for breakthrough.  Given the fact that we     are using Dilaudid for pain control we will watch for sedation.  PLAN:  1. Admit to telemetry bed given hyperkalemia.  2. NG tube placement.  3. D-5 normal saline with 40 of Kay Ciel at 200 cc an hour x 10 hours (total     80 mEq potassium) then decrease  to 100 cc per hour.  4. Capillary blood glucose q.6h. with sliding-scale insulin.  5. Pepcid 20 mg IV q.12h.  6. Baseline EKG.  7. Send next stool for C. difficile (patient had been on clindamycin     recently).  8. Dilaudid 1-2 mg IV q.3h. p.r.n. pain (hold for sedation).  9. Ativan 0.5 mg IV q.12h. (hold for sedation). 10. Ativan 0.5 mg IV q.4h. p.r.n. severe anxiety. 11. Phenergan 25 mg suppositories q.6h. p.r.n. nausea (I would use this rather     IV given her history of vein irritation with potassium). 12. Check magnesium TSH, PT and PTT. 13. DVT prophylaxis:  PAS hose in light of possible upcoming surgery. DD:  12/07/00 TD:  12/08/00 Job: [id number]

## 2011-02-18 NOTE — Cardiovascular Report (Signed)
NAME:  MERLY, HINKSON NO.:  [id number]   MEDICAL RECORD NO.:  [id number]          PATIENT TYPE:  OIB   LOCATION:  2853                         FACILITY:  [facility]   PHYSICIAN:  Colleen Can. Deborah Chalk, M.D.DATE OF BIRTH:  1936/06/08   DATE OF PROCEDURE:  03/28/2006  DATE OF DISCHARGE:                              CARDIAC CATHETERIZATION   PROCEDURE:  Removal of loop recorder with fluoroscopy.   PROCEDURE:  The loop recorder, placed originally in May 2006 for evaluation  of syncope, was scheduled for removal today because of duration of  effectiveness.   Using fluoroscopy, because of the patient's size, we were able to locate the  loop recorder.  It was deep under adipose tissue and was palpable.  A 2 cm  incision was made at the superior end of the loop recorder.  Using sharp and  Bovie dissection, we will locate that.  Sutures were removed.  The loop  recorder was removed.  The wound was flushed with kanamycin solution.  The  wound was closed with 2-0 and subsequently 4-0 Vicryl.  Steri-Strips  applied.  The patient tolerated the procedure well.  She received Valium as  a preop and Versed during the procedure.  Vancomycin was given as a preop  antibiotic.  The patient tolerated procedure well.      Colleen Can. Deborah Chalk, M.D.  Electronically Signed     SNT/MEDQ  D:  03/28/2006  T:  03/28/2006  Job:  [id number]

## 2011-02-18 NOTE — Cardiovascular Report (Signed)
NAME:  Doris Carr, Doris Carr NO.:  [id number]   MEDICAL RECORD NO.:  [id number]          PATIENT TYPE:  OIB   LOCATION:  6501                         FACILITY:  [facility]   PHYSICIAN:  Colleen Can. Deborah Chalk, M.D.DATE OF BIRTH:  Aug 03, 1936   DATE OF PROCEDURE:  03/23/2005  DATE OF DISCHARGE:                              CARDIAC CATHETERIZATION   HISTORY:  Ms. Crunkleton is referred for evaluation of palpitations and shortness  of breath.  She has had syncope.  She is referred for evaluation of coronary  anatomy.   PROCEDURE:  Right and left heart catheterization with selective coronary  angiography and left ventricular angiography.   TYPE AND SITE OF ENTRY:  Percutaneous, right femoral artery; percutaneous,  right femoral vein.   CATHETERS:  The 4-French 4-curved Judkins right and left coronary catheters,  4-French pigtail ventriculographic catheter, Swan-Ganz thermodilution  catheter.   CONTRAST MATERIAL:  Omnipaque.   MEDICATIONS GIVEN PRIOR TO PROCEDURE:  Valium 10 mg p.o.   MEDICATIONS GIVEN DURING THE PROCEDURE:  Versed 2 mg IV.   COMMENT:  The patient tolerated the procedure well.   HEMODYNAMIC DATA:  Right atrial pressure:  A wave of 8, P wave of  7, mean  of 6.  RV was 31/2-6.  PA was 28/9.  Pulmonary capillary wedge pressure is A  wave of 13, V wave of 11, mean of 9.  Aortic pressure of 148/68.  LV  pressure of 140/9-14.  There was no aortic valve gradient noted on pullback.   ANGIOGRAPHIC DATA:  1.  Left main coronary artery is normal.  2.  Left anterior descending:  The left anterior descending is a long vessel      that crossed the apex.  At the level of the first diagonal and first      septal perforating branch, the vessel narrows but appears to narrow      somewhat naturally at this point because of the branching.  There does      not appear to be any significant focal obstructive disease.  3.  Intermediate:  There is a large intermediate vessel that is  free of      significant obstructive disease.  4.  Left circumflex:  The left circumflex is a relatively small vessel.  It      is normal.  5.  The right coronary artery is a dominant vessel.  It is normal.   LEFT VENTRICULAR ANGIOGRAM:  Left ventricular angiogram is performed in the  RAO position.  The overall cardiac size and silhouette are normal.  Global  ejection fraction is 60%.  Regional wall motion is normal.   OVERALL IMPRESSION:  1.  Normal left ventricular function.  2.  Normal coronary arteries.  3.  Normal right heart pressures.   PLAN:  The patient will be evaluated for abnormal CT findings of the chest.  It is felt that she has no significant epicardial coronary disease at this  point in time.       SNT/MEDQ  D:  03/23/2005  T:  03/23/2005  Job:  [id number]

## 2011-02-18 NOTE — Op Note (Signed)
NAMEELISANDRA, DESHMUKH NO.:  [id number]   MEDICAL RECORD NO.:  [id number]          PATIENT TYPE:  INP   LOCATION:  4733                         FACILITY:  [facility]   PHYSICIAN:  Cassell Clement, M.D. DATE OF BIRTH:  04-Feb-1936   DATE OF PROCEDURE:  DATE OF DISCHARGE:                                 OPERATIVE REPORT   CHIEF COMPLAINT:  Syncope.   HISTORY:  This is a 75 year old married Caucasian female admitted with a  history of two episodes of sudden brief syncope.  The first episode occurred  about four weeks ago while she was walking across the street to visit her  son.  She awoke in the road not having realized what had happened.  There  was no antecedent warning such as dizziness, nausea or tunnel vision, etc.  When she woke up she was alert and there was no sensation of being confused  or groggy.  The second episode occurred two weeks later which was two weeks  ago while she was shopping at [employer].  Again, there was no warning whatsoever.  She was having a good time shopping and was not under any particular stress  and she woke up lying on the floor with people standing over her trying to  help her.  Again, there was no seizure activity noted. Of note is the fact  that the patient has had right-sided headaches for the past two weeks.  She  has had a long history of headache but they have been worse.  She had had an  MRI of the brain in 2004 which had shown a small meningioma which she states  was on the left side.  The patient has a long history of hypertension ever  since the birth of her children.  She is on Altace and Norvasc.  She has had  a history of diabetes for approximately 15 years.   FAMILY HISTORY:  Her mother died of Alzheimer's at age 17 and had prior  hypertension and had a permanent pacemaker for the last three years of her  life.  Patient's father is still living at age 49, has Alzheimer's and is in  [facility].   SOCIAL  HISTORY:  The patient is married.  She has three children.  She does  not use alcohol or tobacco.  She has multiple allergies as noted in the  chart.   REVIEW OF SYMPTOMS:  CARDIOVASCULAR:  No history of exertional angina or  unusual exertional dyspnea or heart failure.  She does do water aerobics  twice a week.  GASTROINTESTINAL:  No history of peptic ulcer.  She has  occasional indigestion.  She still has her gallbladder.  GENITOURINARY:  No  dysuria.  METABOLIC:  She has had a recent increase in fatigue and malaise.  NEUROLOGIC:  History of meningioma.  The remainder of review of systems is  negative in detail.   PAST SURGICAL HISTORY:  Hysterectomy 30 years ago with rectocele and  cystocele repair by Dr. Laural Benes and Dr. Katrinka Blazing.  She has also had inguinal  hernia. She has  had bilateral cataracts.   PHYSICAL EXAMINATION:  GENERAL APPEARANCE:  She is a pleasant Caucasian  female in no distress.  VITAL SIGNS:  Her blood pressure is 124/70, pulse 80, normal sinus rhythm,  respiratory rate are normal.  HEENT:  Unremarkable.  NECK:  Carotids no bruits, jugular venous pressure  normal.  Thyroid normal.  There is no lymphadenopathy.  CHEST:  Clear to percussion and auscultation.  CARDIOVASCULAR:  There is a quiet precordium without murmurs, rubs, gallops,  or clicks.  ABDOMEN:  Soft without hepatosplenomegaly or masses.  EXTREMITIES:  Good peripheral pulses, no phlebitis, no edema.   Her electrocardiogram shows new left bundle branch block which was not  present on a prior EKG in 2002.   Initial labs are unremarkable except for a low potassium of 3.3, sodium 133,  BUN 26, creatinine 1.7, blood sugar 195.  Cardiac enzymes are negative x1.   Chest x-ray is pending.   IMPRESSION:  1.  Sudden syncopal attacks occurring four weeks ago and two weeks ago.      Etiology is unknown.  We must rule out a sudden cardiac arrhythmia such      as sudden complete heart block related to her left  bundle branch block      to cause a Stokes-Kerman attack.  2.  Left bundle branch block, new since 2002.  3.  Diabetes mellitus.  4.  Hypertensive cardiovascular disease.  5.  Hypokalemia.   PLAN:  As outlined by Dr. Rae Roam.  We are holding diuretics in view of mild  elevation of BUN and creatinine and low potassium.  Will replete her  potassium.  With her left bundle branch block, we will consider an adenosine  Cardiolite stress test which could be done in hospital or as an outpatient.  She will probably also require a [employer] event monitor.  Further work-  up as per Dr. Roger Shelter who will see the patient tomorrow morning and  follow her for her cardiac problems.      TB/MEDQ  D:  11/18/2004  T:  11/19/2004  Job:  [id number]   cc:   Dr. Ansel Bong N. Deborah Chalk, M.D.  Fax: [phone]   Genene Churn. Love, M.D.  [address]  [address]  [place]  [place] [address]  Fax: [phone]   Marlyn Corporal, M.D.  [address]  [place]  [place] [address]  Fax: [phone]   C. Duane Lope, M.D.  [address]  [place]  [place] [address]  Fax: [phone]

## 2011-02-18 NOTE — H&P (Signed)
NAME:  Doris Carr, HAGBERG NO.:  [id number]   MEDICAL RECORD NO.:  [id number]          PATIENT TYPE:  OIB   LOCATION:  NA                           FACILITY:  [facility]   PHYSICIAN:  Colleen Can. Deborah Chalk, M.D.DATE OF BIRTH:  [birth year]   DATE OF ADMISSION:  03/23/2005  DATE OF DISCHARGE:                                HISTORY & PHYSICAL   CHIEF COMPLAINT:  Significant fatigue with shortness of breath.   HISTORY OF PRESENT ILLNESS:  [name] is a pleasant 75 year old female.  She has multiple medical problems.  She presents for elective left and right  heart catheterization.  She has had a past history of syncope and currently  has a Reveal loupe recorder in place.  She has had no events recorded and  she has had no recurrence of syncope.  She had evaluation for syncope at  [facility] in February of this year.  Her two-dimensional  echocardiogram was basically normal.  There was no structural abnormality.  She has had an Adenosine Cardiolite which showed no evidence of ischemia  with normal ejection fraction as well.  She had a CT scan of the chest at  that time which showed a small epicardial soft tissue nodule and she has now  had follow-up CT scanning of the chest which shows interval mild enlargement  in the soft tissue nodule in the epicardial fat on the right.  There is also  some mild plaque-like pleural thickening posteriorly bilaterally which was  not identified previously.  This could be scarring, however, the possibility  of pleural spread of tumor is present.  She has had no complaints of chest  pain.  She does have known chronic left bundle branch block as well as  diabetes and hypertension.  She now presents for elective left and right  heart catheterization.   PAST MEDICAL HISTORY:  1.  Syncope.  Recurrent in nature, currently with a Reveal loupe recorder in      place.  2.  History of renal insufficiency.  3.  Diabetes x15 years.  4.  Chronic  peripheral edema.  5.  History of an abnormal chest x-ray and CT scan.  6.  Depression.  7.  Left bundle branch block.  8.  Hypertension x40 years.  9.  Anxiety.  10. Obesity.   PAST SURGICAL HISTORY:  Hysterectomy, rectocele, cystocele, inguinal hernia  repair, and bilateral cataract surgery.   ALLERGIES:  PENICILLIN, SULFA, FLUOROQUINOLONES, PERCOCET, DARVOCET,  MORPHINE, STATINS, TALWIN, NOVOCAIN.  She is intolerant to EXPECTORANTS.   CURRENT MEDICATIONS:  1.  Over-the-counter potassium.  2.  Calcium daily.  3.  Baby aspirin daily.  4.  Flexeril twice a day.  5.  Lexapro 20 mg a day.  6.  Klonopin one tablet in the morning, two tablets in the evening.  7.  Norvasc 10 mg a day.  8.  Amaryl 2 mg a day.  9.  Altace 10 mg a day.  10. Tricor 145 a day.  11. Actos 45 mg a day.  12. Ogen 1.25 1-1/2 tablets daily.  FAMILY HISTORY:  Per the previous record.   SOCIAL HISTORY:  She is married.  She has three children.  She has no  current alcohol or tobacco use.  She quit smoking in 1986 and previously  smoked one pack a day for approximately 20 years.   REVIEW OF SYSTEMS:  Basically as noted above.  Her driving does remain  restricted.  She has had no chest pain.  She has marked fatigue with  shortness of breath.   PHYSICAL EXAMINATION:  GENERAL:  She is a pleasant, elderly, white female in  no acute distress.  VITAL SIGNS:  Her weight is 197 pounds.  Blood pressure is 120/60 sitting,  110/70 standing, heart rate 80 and regular.  SKIN:  Warm and dry.  Color is unremarkable.  HEENT:  Unremarkable.  NECK:  Supple.  LUNGS:  Clear.  HEART:  Regular rhythm.  She does have a loupe recorder in place in the left  pectoral chest.  ABDOMEN:  Obese.  EXTREMITIES:  Without edema.  NEUROLOGY:  Intact.  Her mood is somewhat depressed.   IMPRESSION:  1.  Marked fatigue and shortness of breath.  2.  Abnormal CT scan.  3.  History of syncope, currently with a Reveal loupe recorder in  place.  4.  Chronic left bundle branch block.  5.  Diabetes.  6.  Hypertension.  7.  Depression.   PLAN:  Will proceed on with left and right heart catheterization.  The  procedure has been reviewed in full detail with both her and her husband and  she is willing to proceed on Wednesday, March 23, 2005.       LC/MEDQ  D:  03/22/2005  T:  03/22/2005  Job:  [id number]   cc:   Miguel Aschoff, M.D.  [address]  [place]  [place] [address]  Fax: [phone]

## 2011-02-18 NOTE — Cardiovascular Report (Signed)
NAME:  Doris Carr, Doris Carr NO.:  [id number]   MEDICAL RECORD NO.:  [id number]          PATIENT TYPE:  OIB   LOCATION:  2859                         FACILITY:  [facility]   PHYSICIAN:  Colleen Can. Deborah Chalk, M.D.DATE OF BIRTH:  November 24, 1935   DATE OF PROCEDURE:  02/01/2005  DATE OF DISCHARGE:  02/01/2005                              CARDIAC CATHETERIZATION   PROCEDURE:  Implantation of a loop recorder for evaluation of syncope after  unsuccessful percutaneous monitoring.   DESCRIPTION OF PROCEDURE:  The left parasternal region was prepped and  draped.  The area was infiltrated with 1% Xylocaine.  A pocket was created  to the prepectoral fascia.  A suture was placed with 2-0 silk.  The Reveal  Plus loop recorder was placed in the pocket.  The sutures were tied in the  appropriate position.  The loop recorder was a Medtronic Reveal Plus, serial  number [id number].  The wound was flushed with Kanamycin solution.  The  wound was closed with 2-0 Vicryl and 5-0 Dexon and Steri-Strips were  applied.  The unit was checked for satisfactory sensing after implantation  and sensing was appropriate.      SNT/MEDQ  D:  02/01/2005  T:  02/01/2005  Job:  [id number]

## 2011-02-18 NOTE — H&P (Signed)
NAME:  Doris Carr, Doris Carr NO.:  [id number]   MEDICAL RECORD NO.:  [id number]           PATIENT TYPE:   LOCATION:                                 FACILITY:   PHYSICIAN:  Colleen Can. Deborah Chalk, M.D.DATE OF BIRTH:  09/03/1936   DATE OF ADMISSION:  02/01/2005  DATE OF DISCHARGE:                                HISTORY & PHYSICAL   CHIEF COMPLAINT:  Recurrent syncope.   HISTORY OF PRESENT ILLNESS:  Ms. Figg is a pleasant 75 year old female who  has multiple medical problems.  She has been hospitalized towards the mid  part of February with recurrent episodes of syncope.  She basically had a  negative evaluation at that time with negative 2-D echocardiogram.  Adenosine Cardiolite showed no evidence of ischemia with normal ejection  fraction.  Carotid Dopplers showed no evidence of significant stenosis with  antegrade vertebral artery flow.  A chest x-ray at that time showed a  possible lung nodule and a CT of the chest showed a small soft tissue nodule  in the medial right chest, extrapleural in location, which will need  followup scanning in June of this year.  She has had heart monitoring over  the past two months.  She has had one episode, however, where she just sank  down onto the floor.  Unfortunately she was not wearing her monitor at that  time.  She continues to be restricted from driving.  She is now referred for  elective loop recorder implantation.   PAST MEDICAL HISTORY:  1.  Syncope, questionable etiology.  2.  History of renal insufficiency.  3.  Diabetes.  4.  Chronic peripheral edema.  5.  Abnormal chest x-ray and CT scan with need for repeat in June of 2006.  6.  Depression.  7.  Left bundle branch block.  8.  Hypertension.  9.  Anxiety.  10. Obesity.   PAST SURGICAL HISTORY:  1.  Hysterectomy.  2.  Rectocele and cystocele.  3.  Inguinal hernia repair.  4.  Bilateral cataracts.   ALLERGIES:  Multiple and include:  1.  PENICILLIN.  2.   SULFA.  3.  FLUOROQUINOLONES.  4.  PERCOCET.  5.  DARVOCET.  6.  MORPHINE.  7.  STATIN MEDICINES.  8.  TALWIN.  9.  NOVOCAIN.  10. Intolerant to EXPECTORANTS.   CURRENT MEDICATIONS:  1.  Ogen one-and-a-half tablets daily.  2.  Tricor 145 mg daily.  3.  Altace 10 mg daily.  4.  Glimepiride 2 mg daily.  5.  Aspirin 81 mg daily.  6.  Klonopin.  7.  Flexor p.r.n.  8.  Actos.  9.  Calcium.  10. Norvasc 10 mg a day.  11. Potassium over the counter.  12. Trazodone.  13. Lexapro 20 mg.  14. Calcium with D.   FAMILY HISTORY:  Father is still alive at the age of 33.  He has a history  of Alzheimer's and is currently residing in [facility]  Her  mother died of Alzheimer's at the age of 61 and previously had had a  history  of hypertension and had had a permanent pacemaker in place for the last  three years of her life.   SOCIAL HISTORY:  She is married.  She has three children.  She has no  alcohol or tobacco use.   REVIEW OF SYSTEMS:  Basically as noted above.  Fortunately with her syncope  episode she has suffered no significant injury.  Her driving remains  restricted.  She has had no chest pain.  She is not short of breath.   PHYSICAL EXAMINATION:  GENERAL APPEARANCE:  She is a pleasant, middle-aged,  white female who appears somewhat younger than her stated age.  She is  currently in no acute distress.  VITAL SIGNS:  The blood pressure is 120/70 sitting and 110/70 standing.  The  heart rate is 84.  WEIGHT:  194 pounds.  SKIN:  Warm and dry.  Color is unremarkable.  LUNGS:  Basically clear.  CARDIAC:  Regular rhythm.  ABDOMEN:  Soft.  Positive bowel sounds.  Nontender.  EXTREMITIES:  Without edema.  NEUROLOGIC:  Intact.  There are no gross focal deficits.   OVERALL IMPRESSION:  1.  Recurrent syncope.  2.  Diabetes.  3.  Hypertension.  4.  Anxiety and depression.  5.  Left bundle branch block.  6.  Hyperlipidemia.   PLAN:  Will proceed on with  implantation of the loop recorder.  The  procedure has been reviewed in full detail, including the risks and benefits  and she is willing to proceed on Tuesday, Feb 01, 2005.      Sharlee Blew, N.P.                                            Colleen Can. Deborah Chalk, M.D.  Electronically Signed     LC/MEDQ  D:  01/26/2005  T:  01/26/2005  Job:  [id number]   cc:   Miguel Aschoff, M.D.  [address]  [place]  [place] [address]  Fax: [phone]

## 2011-02-18 NOTE — H&P (Signed)
Doris Carr, Doris Carr NO.:  [id number]   MEDICAL RECORD NO.:  [id number]          PATIENT TYPE:  INP   LOCATION:  1843                         FACILITY:  [facility]   PHYSICIAN:  Hollice Espy, M.D.DATE OF BIRTH:  11-08-1935   DATE OF ADMISSION:  11/18/2004  DATE OF DISCHARGE:                                HISTORY & PHYSICAL   CHIEF COMPLAINT:  Syncope.   HISTORY OF PRESENT ILLNESS:  The patient is a 75 year old white female with  past medical history of diabetes mellitus well controlled as well as  hypertension and hyperlipidemia, who presents after having two syncopal  episodes in the past month.  Apparently, the first episode occurred about  four weeks ago where the patient was walking near her house outside on the  street when, all of a sudden, she passed out.  She denied any warning signs  such as light headedness, dizziness, weakness, feeling of chest pain,  palpitations, shortness of breath.  She says she was walking and the next  thing she knew, she was waking up and she was on the ground.  She did not  know how much exactly she was on the ground for, but she denied any bowel or  bladder incontinence.  She did did not follow up on this or see a doctor  about this and had no problems with this.  She reported no recent illnesses  and she has had no medication changes in the last six months, either in dose  or new medicines.  Approximately two weeks ago, the patient had a second  episode while shopping in a department store.  Again, she was walking, was  examining some clothes, and the next thing she knew she was waking up and  there were people surrounding her that said she had been out for  approximately one minute.  There are no reports of tremors, bowel or bladder  incontinence.  The patient was completely alert and oriented among  immediately waking up, she knew exactly where she was and she had no garbled  speech and no pain or weakness.  She  initially did not want to have this  reported but eventually she told a doctor who reported this to her primary  care physician and the doctor called the patient and arranged for a direct  admission so that the patient could be better evaluated.  Currently, the  patient states she is doing well, she denies any complaints.  She reports  occasional headaches.  She denies any visual changes, dysphagia, chest pain,  palpitations, shortness of breath, wheezing, cough, abdominal pain,  hematuria, dysuria, constipation, diarrhea, focal extremity weakness or  weakness.  She says, overall, she has been feeling a little bit more  generally fatigue, although her husband reports that she stays quite active  and does swim twice a week.  Her review of systems is, otherwise, negative.  The patient, on admission, had some screening labs as well as an EKG done.  The EKG noted a left bundle branch block.  In comparison to an EKG from  March  2002, there was no bundle branch block.  The patient tells me she has  never had a cardiac workup.  Other labs of note are the patient's elevated  BUN and creatinine of 26 and 1.7.  Her cardiac enzymes were done and were  found to be normal.  White count was done which was normal with no shift.  The patient was not anemic.   PAST MEDICAL HISTORY:  Diabetes mellitus, depression, hypertension,  hyperlipidemia, and occasional episodes of reflux.   MEDICATIONS:  The patient is on Actos 45 daily, Amaryl 2 daily, Tricor dose  not known but daily, Lasix 20 daily, Altace dose not known but daily, Pepcid  or a form of H2 blocker given daily as needed, Norvasc dose unknown given in  the evening, Flexeril 10 mg p.o. t.i.d., Trazodone 50 mg p.o. q.h.s.,  Lexapro 20 mg p.o. q.h.s., Klonopin 0.5 p.o. b.i.d.   ALLERGIES:  The patient has multiple allergies including fluoroquinolones,  sulfa, penicillin, statin medications which reportedly cause severe  weakness, morphine, as well as  Percocet, Darvocet, Talwin, Novocain, and  intolerant to expectorants.   SOCIAL HISTORY:  She denies any tobacco, alcohol, or drug use.   FAMILY HISTORY:  Negative for any type of sudden death, sudden irregular  heart rhythms, although her mother did have coronary artery disease.   PHYSICAL EXAMINATION:  VITAL SIGNS:  The patient's vital signs upon arrival to the emergency room  show temperature 97.4, blood pressure 122/69, heart rate 83, respirations  16, 02 saturation is 97% on room air.  GENERAL:  The patient appears to be alert and oriented x 3, no apparent  distress.  HEENT:  Normocephalic, atraumatic, mucous membranes moist, cranial nerves  all intact.  NECK:  No carotid bruits.  HEART:  Regular rate and rhythm, S1 and S2.  LUNGS:  Clear to auscultation bilaterally.  ABDOMEN:  Soft, nontender, nondistended, positive bowel sounds.  EXTREMITIES:  No cyanosis, clubbing, and edema.  NEUROLOGICAL:  The patient has 5/5 strength in flexion, extension, and grip.  Negative Babinski's.  No loss of sensation in her lower extremities, normal  exam.   LABORATORY DATA:  White count 4.3 with no shift, hemoglobin 13.2, hematocrit  37.2, MCV 92, platelet count 167.  CPK 134, MB 2.6, troponin I 0.01.  Calcium 8.8.  Sodium 132, potassium 3.2, chloride 98, bicarb 29, BUN 26,  creatinine 1.7, glucose 195.   ASSESSMENT AND PLAN:  1.  Syncope x 2, first episode four weeks ago, second episode two weeks ago.      No warning signs, sudden drop, only out for approximately a few minutes,      I doubt CVA or seizure, although to be thorough will go ahead and check      a CT of the head as well as Dopplers.  In addition, this may be an      arrhythmia or signs of coronary artery disease, especially given the EKG      changes, will go ahead and check a 2D echo and Dr. Patty Sermons from      cardiology will see.  In addition, noted the mild renal insufficiency.     The patient is on Lasix, will hold her  dose of Lasix, and give her a      little bit of IV fluids.  Will also check a thyroid function tests.  2.  Diabetes.  I doubt this is hypoglycemia.  Her husband reported      hypoglycemic episodes in  the 60s but she was very symptomatic, shaking,      sweating, when these occur.  I do not feel this is medication related.      She is on a number of medications that have a sedative affects including      Trazodone and Klonopin, although these medications have been continued      for quite sometime.  3.  Renal insufficiency.  This may be from dehydration from the Lasix,      alone, although noting she is on Altace, one has to be careful and watch      her creatinine.  4.  In regards to depression, continue the Lexapro.      SKK/MEDQ  D:  11/18/2004  T:  11/18/2004  Job:  [id number]   cc:   Colleen Can. Deborah Chalk, M.D.  Fax: [phone]   Cassell Clement, M.D.  [address]  [place]  [place] [address]  Fax: [phone]   C. Duane Lope, M.D.  [address]  [place]  [place] [address]  Fax: [phone]

## 2011-02-18 NOTE — Consult Note (Signed)
[facility]  Patient:    Doris Carr, Doris Carr                           MRN: [id number] Proc. Date: 12/07/00 Adm. Date:  [id number] Attending:  Anastasio Auerbach CC:         Anastasio Auerbach, M.D.                          Consultation Report  IDENTIFICATION AND CHIEF COMPLAINT:  The patient is a 75 year old female with abdominal pain and discomfort, nausea, vomiting, and a high-grade partial small-bowel obstruction.  HISTORY OF PRESENT ILLNESS:  The patient has been ill since Tuesday.  Has been having nausea and vomiting, but also had two large episodes of watery diarrhea yesterday, associated with the vomiting.  She has been on some p.o. clindamycin for questionable irritable bowel syndrome and it is unknown how much this enters into the equation.  She has nausea, vomiting, and a 3-way abdomen demonstrates a high-grade small-bowel obstruction and surgery was called.  PAST MEDICAL HISTORY:  She has a past medical history of diabetes mellitus, non-insulin dependent; hypertension and an anxiety disorder.  MEDICATIONS:  She is on at least 10 different medications.  Amaryl for her diabetes.  ALLERGIES:  She is intolerant to all the local anesthetics including MARCAINE, LIDOCAINE, BENZOCAINE, etc.  She does not tolerate CODEINE either.  Has used Dilaudid before for pain.  PHYSICAL EXAMINATION:  ABDOMEN:  She is not markedly distended, but moderately so with mild tenderness on the left side with left voluntary guarding.  No rebound.  She has hypoactive, but present, bowel sounds.  No tinkles and rushes.  RECTAL:  Examination per the ER team and is guaiac negative.  LABORATORY STUDIES:  Laboratory studies demonstrate ___________ step stacks of small bowel loops which are distended.  No air in the biliary tree.  No free air.  White blood cell count is normal.  She has no left shift.  Hemoglobin is within normal limits.  IMPRESSION:  High-grade partial small-bowel  obstruction.  PLAN:  NG tube and decompression.  IV hydration.  No IV antibiotics are necessary at this time.  We will follow up with a Clostridium difficile toxin titer because of the diarrhea and the patient being on clindamycin for several days.DD:  12/07/00 TD:  12/08/00 Job: [id number] XL/KG401

## 2011-02-18 NOTE — Discharge Summary (Signed)
NAME:  Doris Carr, Doris Carr                  ACCOUNT NO.:  [id number]   MEDICAL RECORD NO.:  [id number]          PATIENT TYPE:  INP   LOCATION:  4733                         FACILITY:  [facility]   PHYSICIAN:  Corinna L. Lendell Caprice, MDDATE OF BIRTH:  Feb 03, 1936   DATE OF ADMISSION:  11/18/2004  DATE OF DISCHARGE:  11/22/2004                                 DISCHARGE SUMMARY   DIAGNOSES:  1.  Syncope.  2.  Renal insufficiency, suspect prerenal, improved.  3.  Diabetes with an episode of hypoglycemia.  4.  Chronic peripheral edema.  5.  Soft tissue density in the right chest. Needs repeat CAT scan in three      to four months' time to ensure stability.  6.  Depression.  7.  New left bundle branch block.  8.  Hypertension.   DISCHARGE MEDICATIONS:  1.  She is to change her Lasix 20 mg p.o. daily as needed for swelling      instead of daily.  2.  Continue Ogen 1.25 mg p.o. daily.  3.  Actos 45 mg a day.  4.  Tricor 135 mg a day.  5.  Altace 10 mg a day.  6.  Amaryl 2 mg a day.  7.  Norvasc 10 mg a day.  8.  I have recommended that she may want to decrease her Flexeril, Klonopin,      and Desyrel dose and/or frequency due to chronic fatigue but I will      defer to her primary care physician and psychiatrist.   CONDITION:  Stable.   CONSULTATIONS:  Dr. Deborah Chalk   FOLLOW-UP:  Follow up Dr. Deborah Chalk for an event monitor for a month.   ACTIVITY:  No driving.   CONDITION:  Stable.   DIET:  Diabetic, low salt.   PERTINENT LABORATORIES:  TSH was 3. Basic metabolic panel was significant  for a sodium of 133, potassium 3.3, glucose 195. BUN was 26. Creatinine was  1.7. At discharge her potassium was norma. The BUN is 24. Creatinine is 1.3  at discharge. CBC is unremarkable. CPK, MB, and troponin negative.   SPECIAL STUDIES AND RADIOLOGY:  EKG shows normal sinus rhythm with new left  bundle branch block as compared to previous EKG. Adenosine Cardiolite showed  no ischemia and normal ejection  fraction. Carotid Dopplers showed no  evidence of significant stenosis and antegrade vertebral artery flow. Chest  x-ray showed possible lung nodule. No infiltrate or CHF. CT of the chest  showed a small soft tissue nodule in the medial lower right chest  extrapleural in location, question small lymph node 17 x 10 mm. Needs follow-  up CT in three to four months to confirm stability. MRI of the brain showed  atrophy and small vessel disease, chronic sinusitis. No stroke. Echo was  done and at this time official reading is pending.   HISTORY AND HOSPITAL COURSE:  Doris Carr is a pleasant 75 year old white  female who was admitted for two syncopal episodes. She had had one four  weeks prior to admission and two weeks prior to admission, but  did not seek  care at that time. Her BUN and creatinine were noted to be elevated at 26  and 1.7 and she is on an ACE inhibitor and Lasix. The patient also had a new  left bundle branch block as compared to previous. The patient was admitted  to telemetry where she remained in normal sinus rhythm. She had no further  syncopal episodes. She had a normal stress test, normal carotid Dopplers,  and was found to have an incidental possible lung nodule on chest x-ray  which shows a soft tissue extrapleural area which will need follow-up. I do  not believe this has caused her syncope. She also complained of chronic  fatigue, anhedonia, and lack of initiative. TSH was normal. This may have  been somewhat due to the prerenal azotemia but I do not believe that is the  sole explanation for her syncopal episode. She will need an event recorder  for a month which will be set up in Dr. Ronnald Nian office and she will follow  up with him. Due to her chronic fatigue, I have recommended that her  sedating medications be considered as the culprit and she may want to taper  the dose and/or frequency of these medications. Because of her syncope,  increased BUN and creatinine,  and chronic fatigue, I have changed her Lasix  to as needed for edema. She also had an episode of mild hypoglycemia but she  reports she had not eaten the night before. She did not think that  hypoglycemia could have contributed to her syncopal episodes because she had  just eaten in both instances. However, her blood glucoses were not checked,  apparently, at the time of syncope. Total time on the day of discharge is 40  minutes.      CLS/MEDQ  D:  11/22/2004  T:  11/22/2004  Job:  [id number]   cc:   Colleen Can. Deborah Chalk, M.D.  Fax: [phone]   C. Duane Lope, M.D.  [address]  [place]  [place] [address]  Fax: [phone]

## 2011-02-18 NOTE — Op Note (Signed)
NAMELEXEY, FLETES NO.:  [id number]   MEDICAL RECORD NO.:  [id number]          PATIENT TYPE:  INP   LOCATION:  2899                         FACILITY:  [facility]   PHYSICIAN:  Ines Bloomer, M.D. DATE OF BIRTH:  [birth year]   DATE OF PROCEDURE:  04/19/2005  DATE OF DISCHARGE:                                 OPERATIVE REPORT   PREOPERATIVE DIAGNOSIS:  Right epicardial mass.   POSTOPERATIVE DIAGNOSIS:  Right epicardial mass, probable contained cyst.   OPERATION PERFORMED:  Right video assisted thoracoscopic surgery, mini-  thoracotomy, wedge resection of epicardial mass.   SURGEON:  Ines Bloomer, M.D.   FIRST ASSISTANT:  Pecola Leisure, P.A.-C.   After general anesthesia, the patient was turned to the right lateral  thoracotomy position.  She had a right epicardial mass that was felt to be  probably a node and was brought for excision.  After general anesthesia and  a dual lumen tube was inserted, two trocar sites were made at the anterior  and mid axillary line at the seventh intercostal space.  A 0 degree scope  was inserted and the lesion could be seen at the epicardium.  It appeared to  be, however, not nodal but cystic in nature.  A 4-5 cm incision was made at  the fifth intercostal space anteriorly through the anterior axillary line  and going posteriorly.  A small retractor was inserted and the lesion was  identified and then resected with two applications of the TSB-35 stapler.  All bleeding was electrocoagulated.  Some more epicardial fat was removed  with the electrocautery and then two chest tubes were placed through the  trocar sites and tied in place.  The On-Q catheter was placed under direct  visual subpleural using the scope for direct vision and the tunneling  device.  A Marcaine block was done in the usual fashion.  The chest was  closed with two pericostals, #1 Vicryl in the muscle layer, 2-0 Vicryl in  the subcutaneous tissue, and  Dermabond for the skin.  The patient was  returned to the recovery room in stable condition.       DPB/MEDQ  D:  04/19/2005  T:  04/19/2005  Job:  [id number]   cc:   Colleen Can. Deborah Chalk, M.D.  Fax: [phone]

## 2011-02-18 NOTE — H&P (Signed)
NAME:  Doris Carr, RISK NO.:  [id number]   MEDICAL RECORD NO.:  [id number]          PATIENT TYPE:  INP   LOCATION:  NA                           FACILITY:  [facility]   PHYSICIAN:  Ines Bloomer, M.D. DATE OF BIRTH:  28-Dec-1935   DATE OF ADMISSION:  04/19/2005  DATE OF DISCHARGE:                                HISTORY & PHYSICAL   CHIEF COMPLAINT:  Mass, left chest.   HISTORY OF PRESENT ILLNESS:  This 75 year old patient has had multiple  medical problems, has a history of syncope and had a loop recorder placed.  She complains of chronic dizziness.  A CT scan was done during her workup in  February which showed a 17 x 10 cm nodule in the epicardial fat on the left  lower lung.  CT scan was repeated in June which showed that it had increased  in size to 22 cm.  There was also a question of some plaque-like thickening  posterior in the pleura and hemangioma in the liver.  She has had chest  pain. She does have a chronic left bundle branch block.  Last  catheterization apparently was normal.  She is being admitted for wedge  resection of the enlarging nodule.   PAST MEDICAL HISTORY:  1.  Mild renal insufficiency.  2.  Diabetes.  3.  Chronic peripheral edema.  4.  Depression.  5.  Hypertension.  6.  Anxiety.  7.  Obesity.   PAST SURGICAL HISTORY:  1.  Hysterectomy with rectocele and cystocele repair.  2.  Left inguinal hernia repair.  3.  Bilateral cataract surgery.   ALLERGIES:  She is allergic to PENICILLIN, SULFA, QUINOLONE, PERCOCET,  DARVOCET, MORPHINE, STATINS, TALWIN, NOVOCAIN and is intolerant to  EXPECTORANTS.   MEDICATIONS:  1.  Potassium.  2.  Calcium.  3.  Baby aspirin 81 mg a day.  4.  Flexeril twice a day.  5.  Lexapro 20 mg daily.  6.  Klonopin 1 tablet in the morning and 2 tablets at night.  7.  Norvasc 10 mg a day.  8.  Amaryl 2 mg a day.  9.  Altace 10 mg a day.  10. Tri-Chlor 135 mg daily.  11. Actos 45 mg daily.  12. Ogen 1-1/2  tablets daily.   Pulmonary function tests showed an FVC of 1.86, 57% of predicted, FEV1 of  1.52 or 59% of predicted.   FAMILY HISTORY:  Negative for cancer and cardiac disease.   SOCIAL HISTORY:  She is married, has three children.  Quit smoking in 1986.  Does not drink alcohol on regular basis.   REVIEW OF SYSTEMS:  She is 175 pounds, 5 feet 5 inches and has had some  recent weight gain.  CARDIAC:  She has no history of angina or atrial  fibrillation; has had some shortness of breath.  PULMONARY:  She has had  bronchitis and chronic cough.  GI: She has had chronic esophageal reflux.  Denies nausea, vomiting, constipation.  GU:  No dysuria or frequent  urination.  VASCULAR:  No history of claudication, DVT,  or TIA.  NEUROLOGIC:  She denies any history of mental illness.  ORTHOPEDIC:  She complains of  joint pain.  PSYCHIATRIC:  She has been treated for depression.  EYES AND  ENT:  No change in eyesight or hearing.  HEMATOLOGIC:  Denies any anemia.   PHYSICAL EXAMINATION:  GENERAL:  She is a slightly obese, Caucasian female  in no acute distress.  VITAL SIGNS:  Blood pressure is 138/80, pulse 79, respirations 18, O2  saturation 97%.  HEENT:  Head is atraumatic.  Eyes: Pupils equal, round, and reactive to  light and accommodation.  Extraocular movements are normal.  Ears: Tympanic  membranes are intact.  Nose:  There is no septal deviation.  Mouth and  throat are without lesions.  NECK: Supple.  There is no thyromegaly or carotid bruit.  ADENOPATHY:  No supraclavicular or axillary adenopathy.  CHEST: Clear to auscultation and percussion.  HEART:  Regular sinus rhythm, no murmurs.  ABDOMEN:  Obese, no hepatosplenomegaly.  EXTREMITIES:  Pulses 2+.  There is no clubbing or edema.  NEUROLOGIC:  Oriented x3.  Sensory and motor intact.  Cranial nerves intact.  SKIN:  Without lesion.   IMPRESSION:  1.  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  Rule out lymphoma, rule      out  __________ disease, rule out sarcoidosis.  2.  Diabetes.  3.  Hypertension.  4.  History of depression.  5.  History of renal insufficiency.  6.  History of syncope.   PLAN:  Left VATS and resection of left epicardial lesion.       DPB/MEDQ  D:  04/17/2005  T:  04/17/2005  Job:  [id number]

## 2011-02-18 NOTE — H&P (Signed)
NAME:  KOLETTE, VEY NO.:  [id number]   MEDICAL RECORD NO.:  [id number]            PATIENT TYPE:   LOCATION:                                 FACILITY:   PHYSICIAN:  Colleen Can. Deborah Chalk, M.D.    DATE OF BIRTH:   DATE OF ADMISSION:  03/28/2006  DATE OF DISCHARGE:                                HISTORY & PHYSICAL   CHIEF COMPLAINT:  None.   HISTORY OF PRESENT ILLNESS:  Ms. Reder is a very pleasant 75 year old  female.  She has multiple medical problems.  She has had a past history of  syncope and has a Reveal loop recorder in place that has been implanted  since May 2006.  She has had no recurrence of syncope.  She now presents for  elective removal.   PAST MEDICAL HISTORY:  1.  Past history of syncope with negative evaluation, which includes      previous 2-D echocardiogram, negative adenosine Cardiolite, previous      left and right heart catheterization showing normal LV function, normal      coronaries and normal right heart pressures in June 2006 and      subsequently status post loop recorder implantation in May 2006.  2.  History of renal insufficiency.  3.  Longstanding diabetes.  4.  Chronic peripheral edema.  5.  Previous abnormal chest x-ray and CT scan with subsequent right video-      assisted thoracoscopic surgery with mini-thoracotomy and wedge resection      of epicardial mass, which turned out to be benign, in July 2006 per      Ines Bloomer, M.D.  6.  Depression.  7.  Longstanding left bundle branch block.  8.  Longstanding hypertension.  9.  Anxiety.  10. Obesity.   PAST SURGICAL HISTORY:  1.  Hysterectomy.  2.  Rectocele.  3.  Cystocele.  4.  Inguinal hernia repair.  5.  Bilateral cataract surgery.   Allergies are multiple and include PENICILLIN, SULFA, FLUOROQUINOLONES,  PERCOCET, DARVOCET, MORPHINE, STATINS, TALWIN and NOVOCAIN and intolerance  to expectorants.   CURRENT MEDICATIONS:  1.  Trazodone 50 mg at bedtime.  2.   Over-the-counter potassium p.r.n.  3.  Over-the-counter calcium p.r.n.  4.  Baby aspirin daily.  5.  Flexeril once a day.  6.  Lexapro 20 mg a day.  7.  Klonopin 0.5 mg b.i.d.  8.  Norvasc 10 mg a day.  9.  Amaryl 2 mg a day.  10. Altace 10 mg a day.  11. Tricor 145 mg a day.  12. Actos 45 mg a day.  13. Ogen 1.25, 1-1/2 tablets daily.   FAMILY HISTORY:  Noncontributory.   SOCIAL HISTORY:  Noncontributory and is unchanged per the past medical  record.   REVIEW OF SYSTEMS:  She has had no recurrence of syncope.  She is back  driving.  She has had really no complaints of chest pain, shortness of  breath, lightheadedness or dizziness.   PHYSICAL EXAMINATION:  GENERAL:  She is a very pleasant elderly female in  no  acute distress.  VITAL SIGNS:  Weight was 187 pounds, blood pressure 124/64 sitting, 120/60  standing, heart rate of 85.  HEENT:  Unremarkable.  NECK:  Supple.  LUNGS:  Clear.  A lateral thoracotomy scar is noted.  ABDOMEN:  Soft, positive bowel sounds, nontender.  EXTREMITIES:  Without edema.  NEUROLOGIC:  Intact.  There are no gross focal deficits.   Pertinent labs are pending.   OVERALL IMPRESSION:  History of syncope with no recurrence and absence of  findings following approximately 13 months of monitoring with loop recorder  in place.   PLAN:  We will make arrangements for explantation of the Reveal loop  recorder.  The procedure has been reviewed in full detail and she is willing  to proceed on March 28, 2006.      Sharlee Blew, N.P.      Colleen Can. Deborah Chalk, M.D.  Electronically Signed    LC/MEDQ  D:  03/13/2006  T:  03/13/2006  Job:  [id number]

## 2011-03-01 ENCOUNTER — Telehealth: Payer: Self-pay | Admitting: *Deleted

## 2011-03-01 NOTE — Telephone Encounter (Signed)
Spoke with [place] at [facility] and she will call pt to set up Ambulatory BP monitor.

## 2011-03-02 ENCOUNTER — Encounter: Payer: Medicare Other | Admitting: Obstetrics and Gynecology

## 2011-03-07 ENCOUNTER — Ambulatory Visit (INDEPENDENT_AMBULATORY_CARE_PROVIDER_SITE_OTHER): Payer: Medicare Other | Admitting: Cardiology

## 2011-03-07 ENCOUNTER — Encounter: Payer: Self-pay | Admitting: Cardiology

## 2011-03-07 DIAGNOSIS — I10 Essential (primary) hypertension: Secondary | ICD-10-CM

## 2011-03-07 NOTE — Progress Notes (Signed)
Subjective:   Doris Carr is seen today for followup visit. Overall, she's been doing well but may have some depression with Tenex and Bystolic. However, she does have good blood pressure control is here in the office and at home. Overall, I think she is doing reasonably well. I've encouraged her to try to stay with a regimen that'll least has her blood pressure controlled over the next several months.  Current Outpatient Prescriptions  Medication Sig Dispense Refill  . amLODipine (NORVASC) 5 MG tablet Take 5 mg by mouth daily.        Marland Kitchen amoxicillin (AMOXIL) 500 MG capsule Take 1 tablet by mouth. Take before dental procedures       . benzonatate (TESSALON) 200 MG capsule Take 200 mg by mouth as needed.        . chlorthalidone (HYGROTON) 25 MG tablet Take 1 tablet by mouth Daily.      . citalopram (CELEXA) 20 MG tablet Take 20 mg by mouth daily.        . clonazePAM (KLONOPIN) 0.5 MG tablet Take 0.5 mg by mouth 2 (two) times daily as needed. 1/2 IN THE AM, AND 1 IN THE PM       . Estropipate (OGEN 1.25 PO) Take by mouth daily.        . fluconazole (DIFLUCAN) 150 MG tablet Take 150 mg by mouth once.        . furosemide (LASIX) 20 MG tablet Take 20 mg by mouth 2 (two) times daily.        Marland Kitchen glimepiride (AMARYL) 2 MG tablet Take 2 mg by mouth daily before breakfast. 1/2 DAILY       . guanFACINE (TENEX) 1 MG tablet Take 2 mg by mouth at bedtime.        Marland Kitchen losartan (COZAAR) 50 MG tablet Take 50 mg by mouth 2 (two) times daily.        . Multiple Vitamin (MULTIVITAMIN) tablet Take 1 tablet by mouth daily.        . nebivolol (BYSTOLIC) 10 MG tablet Take 10 mg by mouth daily.        . pioglitazone (ACTOS) 45 MG tablet Take 45 mg by mouth daily.        . potassium chloride (K-DUR,KLOR-CON) 10 MEQ tablet Take 1 tablet by mouth Daily.      . ramipril (ALTACE) 10 MG tablet Take 10 mg by mouth 2 (two) times daily.        . traZODone (DESYREL) 50 MG tablet Take 50 mg by mouth at bedtime.          Allergies  Allergen  Reactions  . Ciprofloxacin   . Codeine   . Darvocet (Propoxyphene N-Acetaminophen)   . Darvon   . Hydralazine   . Hydrocodone Nausea And Vomiting  . Morphine And Related   . Novocain   . Sulfa Drugs Cross Reactors   . Talwin     Patient Active Problem List  Diagnoses  . Balance problems  . Diabetes mellitus  . Syncope and collapse  . Left bundle branch block  . Renal insufficiency  . Lower extremity edema  . Depression  . Anxiety  . Obesity  . HTN (hypertension)    History  Smoking status  . Former Smoker -- 0.8 packs/day for 15 years  . Types: Cigarettes  . Quit date: 11/03/1984  Smokeless tobacco  . Never Used    History  Alcohol Use No    Family History  Problem Relation  Age of Onset  . Alzheimer's disease Mother   . Alzheimer's disease Father     Review of Systems:   The patient denies any heat or cold intolerance.  No weight gain or weight loss.  The patient denies headaches or blurry vision.  There is no cough or sputum production.  The patient denies dizziness.  There is no hematuria or hematochezia.  The patient denies any muscle aches or arthritis.  The patient denies any rash.  The patient denies frequent falling or instability.  There is no history of depression or anxiety.  All other systems were reviewed and are negative.   Physical Exam:   Blood pressure 118/62. Heart rate 60. Weights 176.The head is normocephalic and atraumatic.  Pupils are equally round and reactive to light.  Sclerae nonicteric.  Conjunctiva is clear.  Oropharynx is unremarkable.  There's adequate oral airway.  Neck is supple there are no masses.  Thyroid is not enlarged.  There is no lymphadenopathy.  Lungs are clear.  Chest is symmetric.  Heart shows a regular rate and rhythm.  S1 and S2 are normal.  There is no murmur click or gallop.  Abdomen is soft normal bowel sounds.  There is no organomegaly.  Genital and rectal deferred.  Extremities are without edema.  Peripheral pulses  are adequate.  Neurologically intact.  Full range of motion.  The patient is not depressed.  Skin is warm and dry.  Assessment / Plan:

## 2011-03-08 NOTE — Assessment & Plan Note (Signed)
Hypertension is now controlled. I'll encourage her to continue to modify her risk factors. We'll continue her current regimen for now. I'll have her see Dr. Donovan Kail in followup with subsequent followup with Dr. Marca Ancona.

## 2011-03-10 ENCOUNTER — Telehealth: Payer: Self-pay | Admitting: Cardiology

## 2011-03-10 NOTE — Telephone Encounter (Signed)
PT SAID HER BP HAS BEEN RUNNING LOW, THIS AM WAS 118/54 AND THAT ALL SHE WANTS TO DO IS SLEEP. PLEASE CALL TO ADVISE.

## 2011-03-10 NOTE — Telephone Encounter (Signed)
Called stating her BP this AM was 118/54. Feels "washed out"; like she could sleep all day; weak. States she did not take BP meds this AM; which is Amlodipine 5 mg; losartan 50 mg; bystolic 10 mg and ramipril 10 mg. Will speak w/ Dr. Elease Hashimoto who is DOD.  Per Dr. Elease Hashimoto would advise her to take  meds for AM and hold Tenex for tonight. Advised to call back tomorrow if BP is still low. Tried to reassure her that it will take time for her body to become adjusted to lower BP.

## 2011-03-11 ENCOUNTER — Telehealth: Payer: Self-pay | Admitting: Cardiology

## 2011-03-11 NOTE — Telephone Encounter (Signed)
Called in today because she is concerned about he blood pressure. The day before yesterday it was 110/51 in the morning and she could hardly move. Yesterday it was 118/58 and she could hardly move. Yesterday she spoke with Jodette, who spoke with Dr. Elease Hashimoto and advised her to stop one of her blood pressure medications (Guanfacie). Today her blood pressure is 146/61 and she is wondering if she should continue not taking that one medication or if she should resume her regular doses. Please call back.

## 2011-03-15 ENCOUNTER — Ambulatory Visit: Payer: Medicare Other | Admitting: Physical Therapy

## 2011-03-15 NOTE — Telephone Encounter (Signed)
Pt is concerned about BP back up since holding Tenex last night.  Pt is concerned about restarting the same dose of Tenex.  RN advised pt to try a 1/2 dose of Tenex and call on Monday with results.  Pt verbalized to RN understanding of instructions.

## 2011-03-17 ENCOUNTER — Encounter (INDEPENDENT_AMBULATORY_CARE_PROVIDER_SITE_OTHER): Payer: Medicare Other | Admitting: Obstetrics and Gynecology

## 2011-03-17 DIAGNOSIS — N952 Postmenopausal atrophic vaginitis: Secondary | ICD-10-CM

## 2011-03-17 DIAGNOSIS — R350 Frequency of micturition: Secondary | ICD-10-CM

## 2011-03-17 DIAGNOSIS — N951 Menopausal and female climacteric states: Secondary | ICD-10-CM

## 2011-03-17 DIAGNOSIS — I10 Essential (primary) hypertension: Secondary | ICD-10-CM

## 2011-03-29 ENCOUNTER — Ambulatory Visit: Payer: Medicare Other | Admitting: Physical Therapy

## 2011-04-14 ENCOUNTER — Ambulatory Visit: Payer: Medicare Other | Attending: Family Medicine | Admitting: Physical Therapy

## 2011-04-14 DIAGNOSIS — R262 Difficulty in walking, not elsewhere classified: Secondary | ICD-10-CM | POA: Insufficient documentation

## 2011-04-14 DIAGNOSIS — R5381 Other malaise: Secondary | ICD-10-CM | POA: Insufficient documentation

## 2011-04-14 DIAGNOSIS — M6281 Muscle weakness (generalized): Secondary | ICD-10-CM | POA: Insufficient documentation

## 2011-04-14 DIAGNOSIS — M25569 Pain in unspecified knee: Secondary | ICD-10-CM | POA: Insufficient documentation

## 2011-04-14 DIAGNOSIS — IMO0001 Reserved for inherently not codable concepts without codable children: Secondary | ICD-10-CM | POA: Insufficient documentation

## 2011-04-19 ENCOUNTER — Ambulatory Visit: Payer: Medicare Other | Admitting: Physical Therapy

## 2011-04-21 ENCOUNTER — Ambulatory Visit: Payer: Medicare Other | Admitting: Physical Therapy

## 2011-04-26 ENCOUNTER — Ambulatory Visit: Payer: Medicare Other | Admitting: Physical Therapy

## 2011-04-28 ENCOUNTER — Ambulatory Visit: Payer: Medicare Other | Admitting: Physical Therapy

## 2011-05-03 ENCOUNTER — Ambulatory Visit: Payer: Medicare Other | Admitting: Physical Therapy

## 2011-05-05 ENCOUNTER — Encounter: Payer: Medicare Other | Admitting: Physical Therapy

## 2011-05-10 ENCOUNTER — Ambulatory Visit: Payer: Medicare Other | Attending: Family Medicine | Admitting: Physical Therapy

## 2011-05-10 DIAGNOSIS — R5381 Other malaise: Secondary | ICD-10-CM | POA: Insufficient documentation

## 2011-05-10 DIAGNOSIS — IMO0001 Reserved for inherently not codable concepts without codable children: Secondary | ICD-10-CM | POA: Insufficient documentation

## 2011-05-10 DIAGNOSIS — M6281 Muscle weakness (generalized): Secondary | ICD-10-CM | POA: Insufficient documentation

## 2011-05-10 DIAGNOSIS — R262 Difficulty in walking, not elsewhere classified: Secondary | ICD-10-CM | POA: Insufficient documentation

## 2011-05-10 DIAGNOSIS — M25569 Pain in unspecified knee: Secondary | ICD-10-CM | POA: Insufficient documentation

## 2011-05-12 ENCOUNTER — Ambulatory Visit: Payer: Medicare Other | Admitting: Physical Therapy

## 2011-05-13 ENCOUNTER — Encounter: Payer: Self-pay | Admitting: Cardiology

## 2011-05-13 ENCOUNTER — Ambulatory Visit (INDEPENDENT_AMBULATORY_CARE_PROVIDER_SITE_OTHER): Payer: Medicare Other | Admitting: Cardiology

## 2011-05-13 DIAGNOSIS — R609 Edema, unspecified: Secondary | ICD-10-CM

## 2011-05-13 DIAGNOSIS — R0609 Other forms of dyspnea: Secondary | ICD-10-CM

## 2011-05-13 DIAGNOSIS — R600 Localized edema: Secondary | ICD-10-CM

## 2011-05-13 DIAGNOSIS — N289 Disorder of kidney and ureter, unspecified: Secondary | ICD-10-CM

## 2011-05-13 DIAGNOSIS — I447 Left bundle-branch block, unspecified: Secondary | ICD-10-CM

## 2011-05-13 DIAGNOSIS — M79606 Pain in leg, unspecified: Secondary | ICD-10-CM

## 2011-05-13 DIAGNOSIS — M79609 Pain in unspecified limb: Secondary | ICD-10-CM

## 2011-05-13 DIAGNOSIS — I10 Essential (primary) hypertension: Secondary | ICD-10-CM

## 2011-05-13 MED ORDER — CARVEDILOL 6.25 MG PO TABS: 6.2500 mg | ORAL_TABLET | Freq: Two times a day (BID) | ORAL | Status: DC

## 2011-05-13 NOTE — Patient Instructions (Signed)
Your physician has recommended you make the following change in your medication:  Stop lasix ( furosemide)  Start Carvedilol ( coreg)  Your physician recommends that you return for lab work in: 2 weeks  August 24,2012  Your physician has requested that you have an echocardiogram. Echocardiography is a painless test that uses sound waves to create images of your heart. It provides your doctor with information about the size and shape of your heart and how well your heart's chambers and valves are working. This procedure takes approximately one hour. There are no restrictions for this procedure.  Your physician recommends that you schedule a follow-up appointment in: 3 weeks with Dr. Shirlee Latch  Your physician has requested that you regularly monitor and record your blood pressure readings at home. Please use the same machine at the same time of day to check your readings and record them to bring to your follow-up visit. Please check your blood pressure only twice a day and record.  Please bring your blood pressure log with you to your next appointment

## 2011-05-14 DIAGNOSIS — M79606 Pain in leg, unspecified: Secondary | ICD-10-CM

## 2011-05-14 HISTORY — DX: Pain in leg, unspecified: M79.606

## 2011-05-14 NOTE — Progress Notes (Signed)
PCP: Dr. Tenny Craw  75 yo with history of CKD, diabetes, chronic LBBB, and difficult to control HTN presents for cardiology evaluation.  Patient has been seen by Dr. Deborah Chalk in the past and is seen by me for the first time today.  She has been seen frequently in this office for management of blood pressure.  She is taking her blood pressure many times a day and her regimen seems to have undergone a lot of changes over time to try to find a regimen that she can tolerate and that will lower her pressure effectively.  She is currently taking chlorthalidone, Lasix, amlodipine 5 mg, and losartan.  If she increases amlodipine to 10 mg, she gets significant lower extremity edema. She tried Bystolic recently but says that it did not affect her BP so she stopped it.  She has had unremarkable renal artery doppler evaluation and urinary catecholeamine collection.  SBP tends to run in the 130s in the morning but in the 150s-170s in the afternoon.  She says she feels overly tired if her BP drops below systolic 130.  Also of note, most recent creatinine and BUN from 7/12 was elevated significantly.    Patient's other active problem has been low back and leg pain. Pain occurs when she stands and walks.  She also notes it lying in bed at night and sometimes awakens with the pain.  Peripheral arterial dopplers evaluation in 6/11 was not suggestive of significant PAD.  She was evaluated by Dr. Dutch Quint (neurosurgery) and per her report was told that it was not due to spinal stenosis.  She has been doing physical therapy which has helped with her stability.   Patient gets short of breath walking up hills or stairs.  She denies exertional dyspnea.  She is not very active because of leg and back pain.    ECG: NSR, LBBB (chronic)  Labs (7/12): HgbA1c 5.9, K 3.8, creatinine 1.49, BUN 47  PMH: 1.  HTN: This has been difficult to control.  She has had renal artery doppler evaluation on two different occasions, both times with no  evidence for renal artery stenosis.  She has had urinary catecholeamine collection that was unremarkable.  Edema with 10 mg amlodipine.  2. Diabetes mellitus type II 3. CKD: Likely due to diabetes and HTN. 4. Chronic LBBB 5. Lung nodule with VATS and biopsy in 2006 that was negative for malignancy.  6. Depression 7. Anxiety 8. Obesity 9. Left heart cath in 6/06 with normal coronaries, EF 60%.  10.  Back and leg pain: Normal ABIs at [facility] in 6/11.  Has seen neurosurgeon who did not think she had significant spinal stenosis per her report.  11.  Hyperlipidemia: Myalgias with statins.   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  Nonsmoker.    FH: Mother with pacemaker and Alzheimer's-type dementia.   ROS: All systems reviewed and negative except as per HPI.   Current Outpatient Prescriptions  Medication Sig Dispense Refill  . amLODipine (NORVASC) 5 MG tablet Take 1 or 2 tablets daily as needed       . benzonatate (TESSALON) 200 MG capsule Take 200 mg by mouth as needed.        . chlorthalidone (HYGROTON) 25 MG tablet Take 1 tablet by mouth Daily.      . cimetidine (TAGAMET) 400 MG tablet Take 400 mg by mouth as needed.        . citalopram (CELEXA) 20 MG tablet Take 20 mg by mouth  daily.        . clonazePAM (KLONOPIN) 0.5 MG tablet Take 0.5 mg by mouth. 1/2 IN THE AM, AND 1 IN THE PM      . estropipate (OGEN 1.25) 1.5 MG tablet Take 1.5 mg by mouth daily.        . fluconazole (DIFLUCAN) 150 MG tablet Take 150 mg by mouth as needed.       Marland Kitchen glimepiride (AMARYL) 1 MG tablet Take 1/2 to 1  tablet daily       . L-Methylfolate-B6-B12 (METANX) 3-35-2 MG TABS Take 1 tablet by mouth 2 (two) times daily.        Marland Kitchen losartan (COZAAR) 50 MG tablet Take 50 mg by mouth 2 (two) times daily.        . pioglitazone (ACTOS) 45 MG tablet Take 45 mg by mouth daily.        . traZODone (DESYREL) 50 MG tablet Take 50 mg by mouth at bedtime.        . carvedilol (COREG) 6.25 MG tablet Take 1 tablet  (6.25 mg total) by mouth 2 (two) times daily.  60 tablet  11    BP 158/70  Pulse 70  Resp 18  Ht 5\' 3"  (1.6 m)  Wt 176 lb 12.8 oz (80.196 kg)  BMI 31.32 kg/m2 General: NAD, overweight Neck: No JVD, no thyromegaly or thyroid nodule.  Lungs: Clear to auscultation bilaterally with normal respiratory effort. CV: Nondisplaced PMI.  Heart regular S1/S2, no S3/S4, no murmur.  Trace ankle edema.  No carotid bruit.  Normal pedal pulses. Bilateral lower leg venous varicosities.  Abdomen: Soft, nontender, no hepatosplenomegaly, no distention.   Neurologic: Alert and oriented x 3.  Psych: Normal affect. Extremities: No clubbing or cyanosis.

## 2011-05-14 NOTE — Assessment & Plan Note (Addendum)
Back and leg pain are a significant problem for her and are causing some functional limitation.  PT has helped.  No evidence for PAD by noninvasive evaluation.  Apparently no significant spinal stenosis based on patient's understanding of her workup by Dr. Dutch Quint.  ? Neuropathy, would perhaps consider a neurology referral.   I spent > 40 minutes talking to patient and reviewing records.

## 2011-05-14 NOTE — Assessment & Plan Note (Signed)
Seems to be controlled at this time.  Will have her wear compression stockings since I am going to stop Lasix.  I will also get a BNP with labs in 2 weeks and will get an echo to assess LV systolic and diastolic function.

## 2011-05-14 NOTE — Assessment & Plan Note (Signed)
Patient is checking her blood pressure up to 10 times a day and has a lot of anxiety associated with her hypertension.  Blood pressure in general seems to be doing well in the morning and running high in the afternoon.  Her BUN and creatinine were considerably elevated when checked in 7/12.  - I have asked her to only check her BP twice a day.  - I think it is probably best that she not be on 2 diuretics (chlorthalidone and Lasix) given rise in BUN/creatinine.  I will have her stop Lasix as chlorthalidone is a much more effective blood pressure-lowering agent.  She will continue chlorthalidone at 25 mg daily. I will have her wear compression stockings to try to prevent any increase in uncomfortable lower extremity edema with stopping Lasix.   - I will not titrate up amlodipine as it has caused significantly lower extremity edema at the 10 mg dose in the past.   - I will not change her ARB at this time.   - I will have her start on Coreg 6.25 mg bid.  If she tolerates this and needs further BP control, this can be titrated up.  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  I would consider trying this in the future if further control is needed but would like to see renal function stabilize out first.  - BMET in 2 weeks and followup in this office in 3 weeks.

## 2011-05-14 NOTE — Assessment & Plan Note (Signed)
CKD but recent rise in BUN/creatinine when checked in 7/12.  As above, will stop Lasix and repeat BMET in 2 weeks.

## 2011-05-14 NOTE — Assessment & Plan Note (Signed)
Chronic LBBB.

## 2011-05-17 ENCOUNTER — Ambulatory Visit: Payer: Medicare Other | Admitting: Physical Therapy

## 2011-05-19 ENCOUNTER — Ambulatory Visit: Payer: Medicare Other | Admitting: Physical Therapy

## 2011-05-24 ENCOUNTER — Ambulatory Visit: Payer: Medicare Other | Admitting: Physical Therapy

## 2011-05-26 ENCOUNTER — Ambulatory Visit: Payer: Medicare Other | Admitting: Physical Therapy

## 2011-05-27 ENCOUNTER — Encounter: Payer: Self-pay | Admitting: Cardiology

## 2011-05-27 ENCOUNTER — Other Ambulatory Visit (INDEPENDENT_AMBULATORY_CARE_PROVIDER_SITE_OTHER): Payer: Medicare Other | Admitting: *Deleted

## 2011-05-27 ENCOUNTER — Ambulatory Visit (HOSPITAL_COMMUNITY): Payer: Medicare Other | Attending: Cardiology | Admitting: Radiology

## 2011-05-27 DIAGNOSIS — R0989 Other specified symptoms and signs involving the circulatory and respiratory systems: Secondary | ICD-10-CM | POA: Insufficient documentation

## 2011-05-27 DIAGNOSIS — I079 Rheumatic tricuspid valve disease, unspecified: Secondary | ICD-10-CM | POA: Insufficient documentation

## 2011-05-27 DIAGNOSIS — I447 Left bundle-branch block, unspecified: Secondary | ICD-10-CM | POA: Insufficient documentation

## 2011-05-27 DIAGNOSIS — R0609 Other forms of dyspnea: Secondary | ICD-10-CM | POA: Insufficient documentation

## 2011-05-27 DIAGNOSIS — I059 Rheumatic mitral valve disease, unspecified: Secondary | ICD-10-CM | POA: Insufficient documentation

## 2011-05-27 DIAGNOSIS — I10 Essential (primary) hypertension: Secondary | ICD-10-CM

## 2011-05-27 DIAGNOSIS — E119 Type 2 diabetes mellitus without complications: Secondary | ICD-10-CM | POA: Insufficient documentation

## 2011-05-27 DIAGNOSIS — M7989 Other specified soft tissue disorders: Secondary | ICD-10-CM | POA: Insufficient documentation

## 2011-05-27 DIAGNOSIS — I319 Disease of pericardium, unspecified: Secondary | ICD-10-CM | POA: Insufficient documentation

## 2011-05-27 LAB — BASIC METABOLIC PANEL
Calcium: 8.8 mg/dL (ref 8.4–10.5)
Creatinine, Ser: 1.4 mg/dL — ABNORMAL HIGH (ref 0.4–1.2)
GFR: 39.45 mL/min — ABNORMAL LOW (ref >60.00)
Sodium: 135 mEq/L (ref 135–145)

## 2011-05-27 LAB — BRAIN NATRIURETIC PEPTIDE: Pro B Natriuretic peptide (BNP): 108 pg/mL — ABNORMAL HIGH (ref 0.0–100.0)

## 2011-05-30 ENCOUNTER — Telehealth: Payer: Self-pay | Admitting: Cardiology

## 2011-05-30 NOTE — Telephone Encounter (Signed)
I talked with pt about recent lab and echo results.

## 2011-05-30 NOTE — Telephone Encounter (Signed)
Test result

## 2011-05-31 ENCOUNTER — Ambulatory Visit: Payer: Medicare Other | Admitting: Physical Therapy

## 2011-06-02 ENCOUNTER — Encounter: Payer: Medicare Other | Admitting: Physical Therapy

## 2011-06-07 ENCOUNTER — Encounter: Payer: Medicare Other | Admitting: Physical Therapy

## 2011-06-08 ENCOUNTER — Encounter: Payer: Self-pay | Admitting: Cardiology

## 2011-06-09 ENCOUNTER — Encounter: Payer: Self-pay | Admitting: Cardiology

## 2011-06-09 ENCOUNTER — Ambulatory Visit (INDEPENDENT_AMBULATORY_CARE_PROVIDER_SITE_OTHER): Payer: Medicare Other | Admitting: Cardiology

## 2011-06-09 VITALS — BP 130/62 | HR 61 | Ht 64.0 in | Wt 180.4 lb

## 2011-06-09 DIAGNOSIS — I509 Heart failure, unspecified: Secondary | ICD-10-CM

## 2011-06-09 DIAGNOSIS — I5032 Chronic diastolic (congestive) heart failure: Secondary | ICD-10-CM

## 2011-06-09 DIAGNOSIS — I10 Essential (primary) hypertension: Secondary | ICD-10-CM

## 2011-06-09 MED ORDER — CARVEDILOL 12.5 MG PO TABS: 12.5000 mg | ORAL_TABLET | Freq: Two times a day (BID) | ORAL | Status: DC

## 2011-06-09 NOTE — Patient Instructions (Signed)
Increase Coreg(carvedilol) to 12.5mg  twice a day. You can take two 6.25mg  tablets twice a day.  Your physician recommends that you schedule a follow-up appointment in: 3 months with Dr Shirlee Latch.

## 2011-06-12 DIAGNOSIS — I5032 Chronic diastolic (congestive) heart failure: Secondary | ICD-10-CM

## 2011-06-12 HISTORY — DX: Chronic diastolic (congestive) heart failure: I50.32

## 2011-06-12 NOTE — Assessment & Plan Note (Signed)
Mild chronic diastolic CHF with mildly elevated BNP.  LVH on echo with preserved EF.  She has a tendency toward lower extremity edema.  I asked her to talk to Dr. Tenny Craw about considering changing Actos to metformin given possible volume retention in the setting of Actos.

## 2011-06-12 NOTE — Assessment & Plan Note (Signed)
BP is better but still high most of the time.  She tolerated Coreg well.   - I will have her increase Coreg to 12.5 mg bid.  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  I would consider trying this in the future if further control is needed but would like to see renal function stabilize out first.

## 2011-06-12 NOTE — Progress Notes (Signed)
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  She seems to be doing better.  At last appointment, I put her on Coreg and SBP is now running in the 140s-150s, still high but better than prior.  She has finished rehab and is walking much better with less pain.  She walks for about 30 minutes a day now.  She is walking on flat ground without dyspnea but has some dyspnea with hills and stairs.  Leg edema is also a bit better.  Echo showed moderate LV hypertrophy, normal EF, and mild MR.   ECG: NSR, LBBB (chronic)  Labs (7/12): HgbA1c 5.9, K 3.8, creatinine 1.49, BUN 47 Labs (8/12): BNP 108, K 3.7, creatinine 1.3 => 1.4  PMH: 1.  HTN: This has been difficult to control.  She has had renal artery doppler evaluation on two different occasions, both times with no evidence for renal artery stenosis.  She has had urinary catecholeamine collection that was unremarkable.  Edema with 10 mg amlodipine.  2. Diabetes mellitus type II 3. CKD: Likely due to diabetes and HTN. 4. Chronic LBBB 5. Lung nodule with VATS and biopsy in 2006 that was negative for malignancy.  6. Depression 7. Anxiety 8. Obesity 9. Left heart cath in 6/06 with normal coronaries, EF 60%.  10.  Back and leg pain: Normal AB[facility] at [facility] in [facility]11.  Has seen neurosurgeon who did not think she had significant spinal stenosis per her report.  11.  Hyperlipidemia: Myalgias with statins.  12.  Diastolic CHF: Echo (8/12) with EF 60-65%, moderate LV hypertrophy, mild LVH.   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  Nonsmoker.    FH: Mother with pacemaker and Alzheimer's-type dementia.   ROS: All systems reviewed and negative except as per HPI.   Current Outpatient Prescriptions  Medication Sig Dispense Refill  . amLODipine (NORVASC) 5 MG tablet Take 1 or 2 tablets daily as needed       . chlorthalidone (HYGROTON) 25 MG tablet Take 1 tablet by  mouth Daily.      . cimetidine (TAGAMET) 400 MG tablet Take 400 mg by mouth as needed.        . citalopram (CELEXA) 20 MG tablet Take 20 mg by mouth daily.        . clonazePAM (KLONOPIN) 0.5 MG tablet Take 0.5 mg by mouth. 1/2 IN THE AM, AND 1 IN THE PM      . estropipate (OGEN 1.25) 1.5 MG tablet Take 1.5 mg by mouth daily.        Marland Kitchen glimepiride (AMARYL) 1 MG tablet Take 1/2 to 1  tablet daily       . L-Methylfolate-B6-B12 (METANX) 3-35-2 MG TABS Take 1 tablet by mouth 2 (two) times daily.        Marland Kitchen losartan (COZAAR) 50 MG tablet Take 50 mg by mouth 2 (two) times daily.        . pioglitazone (ACTOS) 45 MG tablet Take 45 mg by mouth daily.        . traZODone (DESYREL) 50 MG tablet Take 50 mg by mouth at bedtime.        . benzonatate (TESSALON) 200 MG capsule Take 200 mg by mouth as needed.        . carvedilol (COREG) 12.5 MG tablet Take 1 tablet (12.5 mg total) by mouth 2 (two) times daily.  60 tablet  6  .  fluconazole (DIFLUCAN) 150 MG tablet Take 150 mg by mouth as needed.         BP 130/62  Pulse 61  Ht 5\' 4"  (1.626 m)  Wt 180 lb 6.4 oz (81.829 kg)  BMI 30.97 kg/m2 General: NAD, overweight Neck: No JVD, no thyromegaly or thyroid nodule.  Lungs: Clear to auscultation bilaterally with normal respiratory effort. CV: Nondisplaced PMI.  Heart regular S1/S2, soft S4, no murmur.  1+ ankle edema.  No carotid bruit.  Normal pedal pulses. Bilateral lower leg venous varicosities.  Abdomen: Soft, nontender, no hepatosplenomegaly, no distention.   Neurologic: Alert and oriented x 3.  Psych: Normal affect. Extremities: No clubbing or cyanosis.

## 2011-06-14 ENCOUNTER — Encounter: Payer: Medicare Other | Admitting: Physical Therapy

## 2011-06-16 ENCOUNTER — Encounter: Payer: Medicare Other | Admitting: Physical Therapy

## 2011-08-22 ENCOUNTER — Ambulatory Visit (INDEPENDENT_AMBULATORY_CARE_PROVIDER_SITE_OTHER): Payer: Medicare Other | Admitting: Cardiology

## 2011-08-22 ENCOUNTER — Encounter: Payer: Self-pay | Admitting: Cardiology

## 2011-08-22 DIAGNOSIS — I5032 Chronic diastolic (congestive) heart failure: Secondary | ICD-10-CM

## 2011-08-22 DIAGNOSIS — M79606 Pain in leg, unspecified: Secondary | ICD-10-CM

## 2011-08-22 DIAGNOSIS — I10 Essential (primary) hypertension: Secondary | ICD-10-CM

## 2011-08-22 DIAGNOSIS — R269 Unspecified abnormalities of gait and mobility: Secondary | ICD-10-CM

## 2011-08-22 DIAGNOSIS — M79609 Pain in unspecified limb: Secondary | ICD-10-CM

## 2011-08-22 DIAGNOSIS — R29818 Other symptoms and signs involving the nervous system: Secondary | ICD-10-CM

## 2011-08-22 DIAGNOSIS — R2689 Other abnormalities of gait and mobility: Secondary | ICD-10-CM

## 2011-08-22 DIAGNOSIS — R2681 Unsteadiness on feet: Secondary | ICD-10-CM

## 2011-08-22 DIAGNOSIS — I739 Peripheral vascular disease, unspecified: Secondary | ICD-10-CM

## 2011-08-22 MED ORDER — SPIRONOLACTONE 25 MG PO TABS: ORAL_TABLET | ORAL | Status: DC

## 2011-08-22 NOTE — Patient Instructions (Addendum)
Start spironolactone 12.5mg  daily. This will be one-half of a 25mg  tablet daily.  Your physician recommends that you return for lab work in: 2 weeks. BMET 401.9   Take and record your blood pressure. Call the office in 2 weeks to give Dr Shirlee Latch the readings. [phone]  Your physician has requested that you have a lower  extremity arterial duplex. This test is an ultrasound of the arteries in the legs. It looks at arterial blood flow in the legs. Allow one hour for Lower and  Arterial scans. There are no restrictions or special instructions   Dr Shirlee Latch has referred your for out-pt physical therapy. phone [phone]  Fax [phone]  Your physician recommends that you schedule a follow-up appointment in: 3 months with Dr Shirlee Latch.

## 2011-08-23 NOTE — Assessment & Plan Note (Signed)
Patient appears euvolemic on exam.  Lower extremity edema is decreased off Actos.

## 2011-08-23 NOTE — Assessment & Plan Note (Signed)
Resistant HTN.  BP still running high on 4 agents.  Renal artery dopplers did not show evidence for stenosis.  No evidence for pheochromocytoma on prior urine catecholeamine collection.  I am going to try her on spironolactone at 12.5 mg daily as resistant HTN is often aldosterone-dependent.  BMET in 2 weeks.  She will call us in 2 weeks with her BP readings.

## 2011-08-23 NOTE — Progress Notes (Signed)
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  She is feeling well and leg swelling has decreased off Actos (she is now on metformin).  She continues to get bilateral leg pain with walking.  She is still a bit unsteady but denies exertional dyspnea.  Weight is down 1 lb since last appointment.  BP continues to run high, 140s-150s when she checks it.  No chest pain.   ECG: NSR, LBBB (chronic)  Labs (7/12): HgbA1c 5.9, K 3.8, creatinine 1.49, BUN 47 Labs (8/12): BNP 108, K 3.7, creatinine 1.3 => 1.4  PMH: 1.  HTN: This has been difficult to control.  She has had renal artery doppler evaluation on two different occasions, both times with no evidence for renal artery stenosis.  She has had urinary catecholeamine collection that was unremarkable.  Edema with 10 mg amlodipine.  2. Diabetes mellitus type II 3. CKD: Likely due to diabetes and HTN. 4. Chronic LBBB 5. Lung nodule with VATS and biopsy in 2006 that was negative for malignancy.  6. Depression 7. Anxiety 8. Obesity 9. Left heart cath in 6/06 with normal coronaries, EF 60%.  10.  Back and leg pain: Normal ABIs at [facility].  Has seen neurosurgeon who did not think she had significant spinal stenosis per her report.  11.  Hyperlipidemia: Myalgias with statins.  12.  Diastolic CHF: Echo (8/12) with EF 60-65%, moderate LV hypertrophy, mild LVH.   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    FH: Mother with pacemaker and Alzheimer's-type dementia.   ROS: All systems reviewed and negative except as per HPI.   Current Outpatient Prescriptions  Medication Sig Dispense Refill  . amLODipine (NORVASC) 5 MG tablet 5 mg 2 (two) times daily.       . benzonatate (TESSALON) 200 MG capsule Take 200 mg by mouth as needed.        . carvedilol (COREG) 12.5 MG tablet Take 25 mg by mouth 2 (two) times daily.        . chlorthalidone  (HYGROTON) 25 MG tablet Take 1 tablet by mouth Daily.      . cimetidine (TAGAMET) 400 MG tablet Take 400 mg by mouth as needed.        . citalopram (CELEXA) 20 MG tablet Take 20 mg by mouth daily.        . clonazePAM (KLONOPIN) 0.5 MG tablet Take 0.5 mg by mouth. 1/2 IN THE AM, AND 1 IN THE PM      . estropipate (OGEN 1.25) 1.5 MG tablet Take 1.5 mg by mouth daily.       . fluconazole (DIFLUCAN) 150 MG tablet Take 150 mg by mouth as needed.       Marland Kitchen glimepiride (AMARYL) 1 MG tablet Take 1/2 to 1  tablet daily       . L-Methylfolate-B6-B12 (METANX) 3-35-2 MG TABS Take 1 tablet by mouth 2 (two) times daily.        Marland Kitchen losartan (COZAAR) 50 MG tablet Take 50 mg by mouth 2 (two) times daily.        . traZODone (DESYREL) 50 MG tablet Take 50 mg by mouth at bedtime.        Marland Kitchen DISCONTD: carvedilol (COREG) 12.5 MG tablet Take 1 tablet (12.5 mg total) by mouth 2 (two) times daily.  60 tablet  6  . metFORMIN (GLUCOPHAGE-XR) 500 MG 24 hr  tablet 500 tablets Daily.      Marland Kitchen spironolactone (ALDACTONE) 25 MG tablet Take 1/2 tablet daily  15 tablet  6    BP 140/70  Pulse 68  Ht 5\' 4"  (1.626 m)  Wt 81.194 kg (179 lb)  BMI 30.73 kg/m2 General: NAD, overweight Neck: No JVD, no thyromegaly or thyroid nodule.  Lungs: Clear to auscultation bilaterally with normal respiratory effort. CV: Nondisplaced PMI.  Heart regular S1/S2, soft S4, no murmur.  No edema.  No carotid bruit.  2+ right DP, trace left DP. Bilateral lower leg venous varicosities.  Abdomen: Soft, nontender, no hepatosplenomegaly, no distention.   Neurologic: Alert and oriented x 3.  Psych: Normal affect. Extremities: No clubbing or cyanosis.

## 2011-08-23 NOTE — Assessment & Plan Note (Signed)
Patient has somewhat nonspecific leg pain.  Her pulses are mildly reduced on the left.  I will have her get lower extremity arterial dopplers to rule out PAD.

## 2011-09-05 ENCOUNTER — Telehealth: Payer: Self-pay | Admitting: Cardiology

## 2011-09-05 ENCOUNTER — Ambulatory Visit: Payer: Medicare Other | Attending: Cardiology | Admitting: Physical Therapy

## 2011-09-05 DIAGNOSIS — I10 Essential (primary) hypertension: Secondary | ICD-10-CM

## 2011-09-05 DIAGNOSIS — R262 Difficulty in walking, not elsewhere classified: Secondary | ICD-10-CM | POA: Insufficient documentation

## 2011-09-05 DIAGNOSIS — R269 Unspecified abnormalities of gait and mobility: Secondary | ICD-10-CM | POA: Insufficient documentation

## 2011-09-05 DIAGNOSIS — R5381 Other malaise: Secondary | ICD-10-CM | POA: Insufficient documentation

## 2011-09-05 DIAGNOSIS — IMO0001 Reserved for inherently not codable concepts without codable children: Secondary | ICD-10-CM | POA: Insufficient documentation

## 2011-09-05 NOTE — Telephone Encounter (Signed)
Nov 20th 146/62am 145/62pm  Nov 21st 139/63am 151/67pm  Nov. 22nd 128/67am 126/64pm  Nov 23rd 127/64am 157/63pm  Nov 24th 147/64am 174/67pm  Nov 25th 134/64am 175/63pm  Nov 26th 109/55am 147/60pm  Nov 27th 122/56am 135/54pm  Nov 28th 137/62am 175/67pm  Nov 29th 121/56am 148/64pm  Nov 30th 127/57am 163/64pm  Dec. 1st 152/66am 149/60pm  Dec. 2nd 141/60am 165/66pm  Dec 3rd 139/54  Per pt she will be in on Wednesday to see Dr. Shirlee Latch

## 2011-09-05 NOTE — Telephone Encounter (Signed)
LMTCB

## 2011-09-05 NOTE — Telephone Encounter (Signed)
I talked with pt. Pt is scheduled for lab 09/09/11 and appt with Dr Shirlee Latch 09/14/11.

## 2011-09-05 NOTE — Telephone Encounter (Signed)
If K is normal when she has BMET done, increase spironolactone to 25 mg daily.

## 2011-09-05 NOTE — Telephone Encounter (Signed)
I will forward to Dr McLean for review 

## 2011-09-07 ENCOUNTER — Ambulatory Visit: Payer: Medicare Other | Admitting: Cardiology

## 2011-09-08 ENCOUNTER — Ambulatory Visit: Payer: Medicare Other | Admitting: Physical Therapy

## 2011-09-09 ENCOUNTER — Encounter (INDEPENDENT_AMBULATORY_CARE_PROVIDER_SITE_OTHER): Payer: Medicare Other | Admitting: Cardiology

## 2011-09-09 ENCOUNTER — Other Ambulatory Visit (INDEPENDENT_AMBULATORY_CARE_PROVIDER_SITE_OTHER): Payer: Medicare Other | Admitting: *Deleted

## 2011-09-09 ENCOUNTER — Encounter: Payer: Medicare Other | Admitting: Cardiology

## 2011-09-09 DIAGNOSIS — I739 Peripheral vascular disease, unspecified: Secondary | ICD-10-CM

## 2011-09-09 DIAGNOSIS — I10 Essential (primary) hypertension: Secondary | ICD-10-CM

## 2011-09-09 DIAGNOSIS — I70219 Atherosclerosis of native arteries of extremities with intermittent claudication, unspecified extremity: Secondary | ICD-10-CM

## 2011-09-09 LAB — BASIC METABOLIC PANEL
CO2: 29 mEq/L (ref 19–32)
Chloride: 101 mEq/L (ref 96–112)
Glucose, Bld: 127 mg/dL — ABNORMAL HIGH (ref 70–99)
Sodium: 138 mEq/L (ref 135–145)

## 2011-09-12 ENCOUNTER — Encounter: Payer: Medicare Other | Admitting: Physical Therapy

## 2011-09-12 MED ORDER — SPIRONOLACTONE 25 MG PO TABS: 25.0000 mg | ORAL_TABLET | Freq: Every day | ORAL | Status: DC

## 2011-09-12 NOTE — Telephone Encounter (Signed)
I talked with pt. She will increase spironolactone to 25mg  daily. She will return for [facility] 09/26/11.

## 2011-09-12 NOTE — Telephone Encounter (Signed)
Reviewed with Dr Shirlee Latch. Based on recent BMET results Dr Shirlee Latch  recommended pt increase spironolactone to 25mg  daily and repeat BMET 2 weeks after increasing spironolactone. LMTCB for pt

## 2011-09-13 ENCOUNTER — Encounter: Payer: Medicare Other | Admitting: Physical Therapy

## 2011-09-14 ENCOUNTER — Ambulatory Visit (INDEPENDENT_AMBULATORY_CARE_PROVIDER_SITE_OTHER): Payer: Medicare Other | Admitting: Cardiology

## 2011-09-14 ENCOUNTER — Encounter: Payer: Self-pay | Admitting: Cardiology

## 2011-09-14 DIAGNOSIS — I10 Essential (primary) hypertension: Secondary | ICD-10-CM

## 2011-09-14 DIAGNOSIS — I739 Peripheral vascular disease, unspecified: Secondary | ICD-10-CM | POA: Insufficient documentation

## 2011-09-14 DIAGNOSIS — E7800 Pure hypercholesterolemia, unspecified: Secondary | ICD-10-CM

## 2011-09-14 DIAGNOSIS — E785 Hyperlipidemia, unspecified: Secondary | ICD-10-CM

## 2011-09-14 DIAGNOSIS — N289 Disorder of kidney and ureter, unspecified: Secondary | ICD-10-CM

## 2011-09-14 HISTORY — DX: Hyperlipidemia, unspecified: E78.5

## 2011-09-14 MED ORDER — NIACIN ER (ANTIHYPERLIPIDEMIC) 500 MG PO TBCR: 500.0000 mg | EXTENDED_RELEASE_TABLET | Freq: Every day | ORAL | Status: DC

## 2011-09-14 MED ORDER — CILOSTAZOL 100 MG PO TABS: 100.0000 mg | ORAL_TABLET | Freq: Two times a day (BID) | ORAL | Status: DC

## 2011-09-14 NOTE — Assessment & Plan Note (Signed)
Stable mild elevation in creatinine.  Follow closely, needs to stay well-hydrated.

## 2011-09-14 NOTE — Patient Instructions (Addendum)
Take pletal (cilostazol) 100mg  twice a day.  Start aspirin 81mg  daily.  Start niaspan 500mg  at bedtime. Take this about 1/2 hour after you take aspirin and take this with a low-fat snack like unsweetened applesauce.  Schedule an appointment with Dr Rich Reining or Dr Excell Seltzer.   Your physician recommends that you return for a FASTING lipid profile 09/26/11 when you have your other lab done.  Your physician recommends that you schedule a follow-up appointment in: 3 months with Dr Shirlee Latch.

## 2011-09-14 NOTE — Progress Notes (Signed)
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  Blood pressure has been better since adding spironolactone and is within normal range today.  She continues to have leg and foot pain, and it seems to have worsened.  She gets pain in bilateral feet and legs at night in bed and has to get up to walk around.  She also gets pain in both legs when she walks any distance.  She does not think one leg is worse than the other.  Symptoms have been present for at least 6 months but have worsened over the last 2 weeks.  I had her get a lower extremity arterial doppler US evaluation as I was unable to feel her pedal pulses well.  This showed moderately decreased ABIs and evidence for > 50% left mid SFA stenosis.  Interestingly, she had apparently normal ABIs at [facility] in 6/11.  Implicating claudication as the only cause of her leg pain is confounded by her low back pain and probable spinal stenosis that is playing a role.    No chest pain.  No significant exertional dyspnea but she is not very active due to her leg pain.   ECG: NSR, LBBB (chronic)  Labs (7/12): HgbA1c 5.9, K 3.8, creatinine 1.49, BUN 47 Labs (8/12): BNP 108, K 3.7, creatinine 1.3 => 1.4 Labs (12/12): K 4.5, creatinine 1.4  PMH: 1.  HTN: This has been difficult to control.  She has had renal artery doppler evaluation on two different occasions, both times with no evidence for renal artery stenosis.  She has had urinary catecholeamine collection that was unremarkable.  Edema with 10 mg amlodipine.  2. Diabetes mellitus type II 3. CKD: Likely due to diabetes and HTN. 4. Chronic LBBB 5. Lung nodule with VATS and biopsy in 2006 that was negative for malignancy.  6. Depression 7. Anxiety 8. Obesity 9. Left heart cath in 6/06 with normal coronaries, EF 60%.  10.  Back pain: Sees a neurosurgeon.  11.  Hyperlipidemia: Myalgias with statins.  12.  Diastolic CHF: Echo  (8/12) with EF 60-65%, moderate LV hypertrophy, mild LVH. 13.  PAD: Normal ABIs at [facility] in 6/11.  Lower extremity evaluation (12/12): ABI 0.79 right, 0.64 left; > 50% mid left SFA stenosis.   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    FH: Mother with pacemaker and Alzheimer's-type dementia.   ROS: All systems reviewed and negative except as per HPI.   Current Outpatient Prescriptions  Medication Sig Dispense Refill  . amLODipine (NORVASC) 5 MG tablet 5 mg 2 (two) times daily.       . benzonatate (TESSALON) 200 MG capsule Take 200 mg by mouth as needed.        . carvedilol (COREG) 12.5 MG tablet Take 25 mg by mouth 2 (two) times daily.        . chlorthalidone (HYGROTON) 25 MG tablet Take 1 tablet by mouth Daily.      . cimetidine (TAGAMET) 400 MG tablet Take 400 mg by mouth as needed.        . citalopram (CELEXA) 20 MG tablet Take 20 mg by mouth daily.        . clonazePAM (KLONOPIN) 0.5 MG tablet Take 0.5 mg by mouth. 1/2 IN THE AM, AND 1 IN THE PM      . estropipate (OGEN 1.25) 1.5 MG tablet Take 1.5 mg by mouth daily.       Marland Kitchen  fluconazole (DIFLUCAN) 150 MG tablet Take 150 mg by mouth as needed.       Marland Kitchen glimepiride (AMARYL) 1 MG tablet Take 1/2 to 1  tablet daily       . L-Methylfolate-B6-B12 (METANX) 3-35-2 MG TABS Take 1 tablet by mouth 2 (two) times daily.        Marland Kitchen losartan (COZAAR) 50 MG tablet Take 50 mg by mouth 2 (two) times daily.        . metFORMIN (GLUCOPHAGE-XR) 500 MG 24 hr tablet 500 tablets Daily.      Marland Kitchen spironolactone (ALDACTONE) 25 MG tablet Take 1 tablet (25 mg total) by mouth daily.  30 tablet  6  . traZODone (DESYREL) 50 MG tablet Take 50 mg by mouth at bedtime.        Marland Kitchen aspirin EC 81 MG tablet Take 1 tablet (81 mg total) by mouth daily.      . cilostazol (PLETAL) 100 MG tablet Take 1 tablet (100 mg total) by mouth 2 (two) times daily.  60 tablet  6  . niacin (NIASPAN) 500 MG CR tablet Take 1 tablet (500 mg total) by mouth at bedtime.  30  tablet  6  . DISCONTD: carvedilol (COREG) 12.5 MG tablet Take 1 tablet (12.5 mg total) by mouth 2 (two) times daily.  60 tablet  6    BP 112/52  Pulse 56  Ht 5\' 4"  (1.626 m)  Wt 79.742 kg (175 lb 12.8 oz)  BMI 30.18 kg/m2  SpO2 98% General: NAD, overweight Neck: No JVD, no thyromegaly or thyroid nodule.  Lungs: Clear to auscultation bilaterally with normal respiratory effort. CV: Nondisplaced PMI.  Heart regular S1/S2, soft S4, no murmur.  No edema.  No carotid bruit.  Unable to palpate pedal pulses, no foot ulcerations. Bilateral lower leg venous varicosities.  Abdomen: Soft, nontender, no hepatosplenomegaly, no distention.   Neurologic: Alert and oriented x 3.  Psych: Normal affect. Extremities: No clubbing or cyanosis.

## 2011-09-14 NOTE — Assessment & Plan Note (Addendum)
Decreased pedal pulses and leg symptoms consistent with claudication.  She has evidence for significant PAD on lower extremity arterial doppler evaluation.  She does not smoke.  Her leg pain is quite significant, but I think the contribution from PAD is probably confounded somewhat due to lumbar spine disease, which is likely adding to her leg symptoms.   - Refer for PV evaluation.  Given her significant symptoms, she may benefit from percutaneous revascularization.  - I am going to start her on cilostazol 100 mg bid to see if this helps her symptoms.   - Continue to walk as much as possible.  - Needs to start ASA 81 given vascular disease.

## 2011-09-14 NOTE — Assessment & Plan Note (Addendum)
Long history of poorly-controlled BP.  BP is better on spironolactone and is excellent today.  She has had renal artery doppler evaluations in the past that have not been suggestive of renal artery stenosis.  However, given the finding of significant PAD, I would like re-evaluation for RAS.   - If she has a lower extremity angiogram after PV clinic evaluation, I would like her renal arteries injected as well.  If she does not end up having a lower extremity angiogram, I will get repeat renal artery dopplers in our office.  - Continue current BP meds.  - Repeat BMET in about a week (increased spironolactone).

## 2011-09-14 NOTE — Assessment & Plan Note (Signed)
She has not been able to tolerate any statin due to muscle weakness.  She did not tolerate Zetia.  Given significant vascular disease, I will have her start on Niaspan 500 mg qhs to be taken 30 minutes after ASA.  If she tolerates this, increase to 1000 mg qhs.  I am going to check her lipids soon for a baseline as they have not been done recently.

## 2011-09-15 ENCOUNTER — Encounter: Payer: Medicare Other | Admitting: Physical Therapy

## 2011-09-15 ENCOUNTER — Encounter: Payer: Self-pay | Admitting: Cardiovascular Disease

## 2011-09-15 ENCOUNTER — Ambulatory Visit (INDEPENDENT_AMBULATORY_CARE_PROVIDER_SITE_OTHER): Payer: Medicare Other | Admitting: Cardiovascular Disease

## 2011-09-15 DIAGNOSIS — I739 Peripheral vascular disease, unspecified: Secondary | ICD-10-CM

## 2011-09-15 DIAGNOSIS — I70219 Atherosclerosis of native arteries of extremities with intermittent claudication, unspecified extremity: Secondary | ICD-10-CM

## 2011-09-15 NOTE — Assessment & Plan Note (Addendum)
The patient has significant lower extremity PAD with some typical symptoms of claudication but predominantly with symptoms related to what I suspected spinal stenosis. She is going to see her neurosurgeon, Dr. Jordan Likes back for followup.  If she does not have significant spinal problems accounting for her pain symptoms, I would favor moving forward with angiography. The patient has had a significant change in her ankle brachial index in a fairly short span of just over one year. The lower extremity duplexes suggestive of severe left SFA stenosis and I would certainly consider PTA and stenting as an option to revascularize her left leg and improve pain symptoms. She is on appropriate medical regimen for cardiovascular disease. If we do move forward with angiography, I plan on 8 renal angiogram at the time of her procedure since she has labile hypertension requiring multiple medications and has diabetes with known vascular disease. I reviewed the risks, indications, and alternatives to lower extremity angiography and possible PTA with the patient and her husband.  They will call back after seeing Dr. Jordan Likes and will let me know and they want to proceed.

## 2011-09-15 NOTE — Progress Notes (Signed)
HPI:  This is a 75 year old woman presenting for evaluation of lower extremity peripheral arterial disease.  She recently underwent lower extremity duplex scanning to evaluate progressive leg pain. This demonstrated an ABI of 0.79 on the right and 0.64 on the left. Her ABIs one year ago were normal. Duplex scanning was suggestive of moderately severe stenosis in the right superficial femoral artery and severe stenosis of the left superficial femoral artery with poststenotic dilatation.  Patient reports progressive bilateral leg pain with both standing and walking over the last 3 months. She has constant pain in her upper legs radiating down to the feet with standing. She describes bilateral calf pain with walking it feels like a pressure sensation and is equal in both legs. She also complains of calf discomfort at night time. Her symptoms are relieved when she is able to lean forward on a grocery cart and she is able to walk pretty well with bending forward. She does complain of mid to lower back pain. She has been evaluated in the past by Dr. Jordan Likes but it has been several years since she has been seen. She denies any history of ulceration on her feet.  Outpatient Encounter Prescriptions as of 09/15/2011  Medication Sig Dispense Refill  . amLODipine (NORVASC) 5 MG tablet 5 mg 2 (two) times daily.       Marland Kitchen aspirin EC 81 MG tablet Take 1 tablet (81 mg total) by mouth daily.      . benzonatate (TESSALON) 200 MG capsule Take 200 mg by mouth as needed.        . carvedilol (COREG) 12.5 MG tablet Take 25 mg by mouth 2 (two) times daily.        . chlorthalidone (HYGROTON) 25 MG tablet Take 1 tablet by mouth Daily.      . cilostazol (PLETAL) 100 MG tablet Take 1 tablet (100 mg total) by mouth 2 (two) times daily.  60 tablet  6  . cimetidine (TAGAMET) 400 MG tablet Take 400 mg by mouth as needed.        . citalopram (CELEXA) 20 MG tablet Take 20 mg by mouth daily.        . clonazePAM (KLONOPIN) 0.5 MG tablet Take  0.5 mg by mouth. 1/2 IN THE AM, AND 1 IN THE PM      . estropipate (OGEN 1.25) 1.5 MG tablet Take 1.5 mg by mouth daily.       . fluconazole (DIFLUCAN) 150 MG tablet Take 150 mg by mouth as needed.       Marland Kitchen glimepiride (AMARYL) 1 MG tablet Take 1/2 to 1  tablet daily       . L-Methylfolate-B6-B12 (METANX) 3-35-2 MG TABS Take 1 tablet by mouth 2 (two) times daily.        Marland Kitchen losartan (COZAAR) 50 MG tablet Take 50 mg by mouth 2 (two) times daily.        . metFORMIN (GLUCOPHAGE-XR) 500 MG 24 hr tablet 500 tablets Daily.      . niacin (NIASPAN) 500 MG CR tablet Take 1 tablet (500 mg total) by mouth at bedtime.  30 tablet  6  . spironolactone (ALDACTONE) 25 MG tablet Take 1 tablet (25 mg total) by mouth daily.  30 tablet  6  . traZODone (DESYREL) 50 MG tablet Take 50 mg by mouth at bedtime.          Ciprofloxacin; Codeine; Darvocet; Darvon; Hydralazine; Hydrocodone; Morphine and related; Novocain; Potassium-containing compounds; Sulfa drugs cross reactors; and  Talwin  Past Medical History  Diagnosis Date  . Balance problems   . Diabetes mellitus   . Syncope and collapse   . Left bundle branch block   . Renal insufficiency   . Lower extremity edema     CHRONIC  . Depression   . Anxiety   . Obesity   . 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 Hypertension     Past Surgical History  Procedure Date  . Thoracotomy   . Cardiac catheterization 2006    NORMAL CORONARIES  . Abdominal hysterectomy 1985    WITH A/P REPAIR  . Cervical cone biopsy   . Thymic cyst removal   . Cataract extraction     History   Social History  . Marital Status: Married    Spouse Name: N/A    Number of Children: N/A  . Years of Education: N/A   Occupational History  . Not on file.   Social History Main Topics  . Smoking status: Former Smoker -- 0.8 packs/day for 15 years    Types: Cigarettes    Quit date: 11/03/1984  . Smokeless tobacco: Never Used  . Alcohol Use: No  .  Drug Use: No  . Sexually Active: Not on file   Other Topics Concern  . Not on file   Social History Narrative  . No narrative on file    Family History  Problem Relation Age of Onset  . Alzheimer's disease Mother   . Hypertension Mother   . Heart disease Mother   . Alzheimer's disease Father   . Diabetes Paternal Grandfather     ROS: General: no fevers/chills/night sweats Eyes: no blurry vision, diplopia, or amaurosis ENT: no sore throat or hearing loss Resp: no cough, wheezing, or hemoptysis CV: no edema or palpitations GI: no abdominal pain, nausea, vomiting, diarrhea, or constipation GU: no dysuria, frequency, or hematuria Skin: no rash Neuro: no headache, numbness, tingling, or weakness of extremities Musculoskeletal: no joint pain or swelling. Positive for back and leg pain as per the history of present illness Heme: no bleeding, DVT, or easy bruising Endo: no polydipsia or polyuria  BP 104/58  Pulse 65  Resp 18  Ht 5\' 4"  (1.626 m)  Wt 80.65 kg (177 lb 12.8 oz)  BMI 30.52 kg/m2  PHYSICAL EXAM: Pt is alert and oriented, WD, WN, in no distress. HEENT: normal Neck: JVP normal. Carotid upstrokes normal without bruits. No thyromegaly. Lungs: equal expansion, clear bilaterally CV: Apex is discrete and nondisplaced, RRR without murmur or gallop Abd: soft, NT, +BS, no bruit, no hepatosplenomegaly Back: no CVA tenderness Ext: no C/C/E        Femoral pulses 2+= without bruits        DP pulses 2+ on the right and 1+ on the left, PT pulses nonpalpable bilaterally Skin: warm and dry without rash Neuro: CNII-XII intact             Strength intact = bilaterally  EKG:  Normal sinus rhythm 61 beats per minute, left bundle branch block  ASSESSMENT AND PLAN:

## 2011-09-15 NOTE — Patient Instructions (Signed)
Your physician recommends that you schedule a follow-up with Dr. Excell Seltzer after you see Dr. Dutch Quint.

## 2011-09-19 ENCOUNTER — Telehealth: Payer: Self-pay | Admitting: Cardiology

## 2011-09-19 NOTE — Telephone Encounter (Signed)
Pt came in signed ROI, All Cardiac records were faxed to Dr.Poole/ Attention [facility] @ [facility] to fax [phone]  09/19/11/km

## 2011-09-20 ENCOUNTER — Other Ambulatory Visit: Payer: Self-pay | Admitting: Neurosurgery

## 2011-09-20 ENCOUNTER — Encounter: Payer: Medicare Other | Admitting: Physical Therapy

## 2011-09-20 DIAGNOSIS — M549 Dorsalgia, unspecified: Secondary | ICD-10-CM

## 2011-09-22 ENCOUNTER — Encounter: Payer: Medicare Other | Admitting: Physical Therapy

## 2011-09-24 ENCOUNTER — Ambulatory Visit
Admission: RE | Admit: 2011-09-24 | Discharge: 2011-09-24 | Disposition: A | Discharge status: Home / Self Care | Payer: Medicare Other | Source: Ambulatory Visit | Attending: Neurosurgery | Admitting: Neurosurgery

## 2011-09-24 DIAGNOSIS — M549 Dorsalgia, unspecified: Secondary | ICD-10-CM

## 2011-09-26 ENCOUNTER — Other Ambulatory Visit (INDEPENDENT_AMBULATORY_CARE_PROVIDER_SITE_OTHER): Payer: Medicare Other | Admitting: *Deleted

## 2011-09-26 ENCOUNTER — Other Ambulatory Visit: Payer: Self-pay | Admitting: Cardiology

## 2011-09-26 DIAGNOSIS — I10 Essential (primary) hypertension: Secondary | ICD-10-CM

## 2011-09-26 DIAGNOSIS — E7800 Pure hypercholesterolemia, unspecified: Secondary | ICD-10-CM

## 2011-09-26 LAB — LIPID PANEL: Cholesterol: 222 mg/dL — ABNORMAL HIGH (ref 0–200)

## 2011-09-26 LAB — BASIC METABOLIC PANEL
BUN: 30 mg/dL — ABNORMAL HIGH (ref 6–23)
Calcium: 9.2 mg/dL (ref 8.4–10.5)
Creatinine, Ser: 1.2 mg/dL (ref 0.4–1.2)
GFR: 49.05 mL/min — ABNORMAL LOW (ref >60.00)
Glucose, Bld: 145 mg/dL — ABNORMAL HIGH (ref 70–99)

## 2011-09-29 ENCOUNTER — Telehealth: Payer: Self-pay | Admitting: Cardiology

## 2011-09-29 ENCOUNTER — Encounter: Payer: Medicare Other | Admitting: Physical Therapy

## 2011-09-29 NOTE — Telephone Encounter (Signed)
No answer.  LMTC

## 2011-09-29 NOTE — Telephone Encounter (Signed)
New Problem:     Patient has gone back on her Niacin but only the 1000mg  tablet in addition to taking acetomenophin and cimetidine (TAGAMET) 400 MG tablet. She was wondering how she should proceed and if this is what she is supposed to do.

## 2011-09-30 NOTE — Telephone Encounter (Signed)
Pt has attempted to take Niaspan 1000mg  and cannot tolerate it.  Wants Dr. Shirlee Latch to know.

## 2011-09-30 NOTE — Telephone Encounter (Signed)
FU Call: Pt returning call from yesterday. Please call pt back.

## 2011-10-05 ENCOUNTER — Telehealth: Payer: Self-pay | Admitting: Cardiology

## 2011-10-05 ENCOUNTER — Encounter: Payer: Medicare Other | Admitting: Physical Therapy

## 2011-10-05 DIAGNOSIS — M545 Low back pain: Secondary | ICD-10-CM | POA: Diagnosis not present

## 2011-10-05 DIAGNOSIS — M79609 Pain in unspecified limb: Secondary | ICD-10-CM | POA: Diagnosis not present

## 2011-10-05 DIAGNOSIS — M48061 Spinal stenosis, lumbar region without neurogenic claudication: Secondary | ICD-10-CM | POA: Diagnosis not present

## 2011-10-05 NOTE — Telephone Encounter (Signed)
I spoke with Dr. Patty Sermons (DOD) and Weston Brass, Pharm D regarding the patient's situation. Per both, the reaction could be coming from either the spironolactone or the pletal. This is difficult to know since the patient started the drugs within two days of each other. Per Dr. Patty Sermons, orders received to have the patient hold pletal first to see if symptoms improve. Per Kennon Rounds, the patient should notice a difference in a few days. If no change is noted, Inspra could be a possible alternative for spironolactone. The patient has been made aware to hold Pletal over the weekend to see if a change in symptoms is noted. She will let us know on Monday if she is not any better. I have advised her to continue with spironolactone until pletal can be ruled out. She has also been advised to continue with antihistamines and that she can apply hydrocortisone cream as needed. She verbalizes understanding. I will forward to Dr. Shirlee Latch and Katina Dung, RN for review.

## 2011-10-05 NOTE — Telephone Encounter (Signed)
I spoke with the patient. She states that she developed a head to toe rash and itching about a week ago. She saw her PCP and he recommended antihistamines which she has been taking. Per the patient, she started spironolactone on 09/12/11. She also started Pletal on 09/14/11. She states she read the package inserts and that spironolactone listed redness to the skin as a side effect. She is states that the addition of spironolactone is the only thing that has helped control her BP's. Her last dose was yesterday. Her BP's have been running 119/54 & 134/62 yesterday and today. Prior to being on spironolactone, she was running around 185 mmHg systolic. She is also on norvasc 5mg  twice daily, coreg 25mg  twice daily, and cozaar 50mg  twice daily. I explained I would need to review with the DOD and our pharmacist and would call her back. She is anxious as Dr. Deborah Chalk tried for a long time to correct her BP and was not able too, now it is controlled but she is having a reaction.

## 2011-10-05 NOTE — Telephone Encounter (Signed)
Agree with plan 

## 2011-10-05 NOTE — Telephone Encounter (Signed)
New Msg: pt calling wanting to discuss with nurse/md allergy pt is having to medications. Pt is beet red all over; symptoms have been present for the last 4 days. Pt is allergic to spironolactone 25 mg and needs a substitute called in. Please return pt call to discuss further.

## 2011-10-06 NOTE — Telephone Encounter (Signed)
Will forward to Katina Dung, Charity fundraiser. I will leave this encounter open for when the patient calls back Monday.

## 2011-10-07 ENCOUNTER — Encounter: Payer: Medicare Other | Admitting: Physical Therapy

## 2011-10-10 NOTE — Telephone Encounter (Signed)
Pt states she saw her back doctor and nothing showed up there. She has a follow-up appt with Dr Excell Seltzer 10/28/11. I will forward to Dr Shirlee Latch for further review/recommendations.

## 2011-10-10 NOTE — Telephone Encounter (Signed)
Reviewed with Dr Shirlee Latch. Pt to stay off Pletal and niaspan--keep appt with Dr Excell Seltzer scheduled for 10/28/11. I discussed with pt's husband.

## 2011-10-10 NOTE — Telephone Encounter (Signed)
Reviewed with Dr Shirlee Latch. Pt to stay off niaspan--not really any other options for lipid management.

## 2011-10-10 NOTE — Telephone Encounter (Signed)
I talked with pt. Pt states she took niaspan about 3 days and could not stand the flushing. She stopped it. Pt states she also had started pletal about the same time and took  pletal for about 3 weeks. She feels the problems she had with redness and itching were due to pletal and not niaspan. She states her PCP agreed with this. She is not taking niaspan or pletal.

## 2011-10-10 NOTE — Telephone Encounter (Signed)
I talked with pt. Pt states she has been off Pletal for 1 week. Her symptoms are improved-the redness is gone and the itching is much better. She has been on spironolactone and has been tolerating it without problems.

## 2011-10-18 DIAGNOSIS — R32 Unspecified urinary incontinence: Secondary | ICD-10-CM | POA: Diagnosis not present

## 2011-10-28 ENCOUNTER — Ambulatory Visit (INDEPENDENT_AMBULATORY_CARE_PROVIDER_SITE_OTHER): Payer: Medicare Other | Admitting: Cardiovascular Disease

## 2011-10-28 ENCOUNTER — Encounter: Payer: Self-pay | Admitting: *Deleted

## 2011-10-28 ENCOUNTER — Encounter (HOSPITAL_COMMUNITY): Payer: Self-pay | Admitting: Pharmacy Technician

## 2011-10-28 ENCOUNTER — Encounter: Payer: Self-pay | Admitting: Cardiovascular Disease

## 2011-10-28 ENCOUNTER — Ambulatory Visit: Payer: Medicare Other | Admitting: Cardiovascular Disease

## 2011-10-28 DIAGNOSIS — I454 Nonspecific intraventricular block: Secondary | ICD-10-CM | POA: Diagnosis not present

## 2011-10-28 DIAGNOSIS — I70219 Atherosclerosis of native arteries of extremities with intermittent claudication, unspecified extremity: Secondary | ICD-10-CM

## 2011-10-28 DIAGNOSIS — I2510 Atherosclerotic heart disease of native coronary artery without angina pectoris: Secondary | ICD-10-CM | POA: Diagnosis not present

## 2011-10-28 LAB — CBC WITH DIFFERENTIAL/PLATELET
Basophils Relative: 0.6 % (ref 0.0–3.0)
Eosinophils Relative: 23.4 % — ABNORMAL HIGH (ref 0.0–5.0)
HCT: 33.8 % — ABNORMAL LOW (ref 36.0–46.0)
Hemoglobin: 11.4 g/dL — ABNORMAL LOW (ref 12.0–15.0)
Lymphs Abs: 0.9 10*3/uL (ref 0.7–4.0)
MCV: 97.4 fl (ref 78.0–100.0)
Monocytes Absolute: 0.5 10*3/uL (ref 0.1–1.0)
Neutro Abs: 3.2 10*3/uL (ref 1.4–7.7)
Neutrophils Relative %: 53.1 % (ref 43.0–77.0)
RBC: 3.47 Mil/uL — ABNORMAL LOW (ref 3.87–5.11)
WBC: 6 10*3/uL (ref 4.5–10.5)

## 2011-10-28 LAB — BASIC METABOLIC PANEL
BUN: 38 mg/dL — ABNORMAL HIGH (ref 6–23)
Creatinine, Ser: 1.6 mg/dL — ABNORMAL HIGH (ref 0.4–1.2)
GFR: 33.5 mL/min — ABNORMAL LOW (ref >60.00)
Glucose, Bld: 166 mg/dL — ABNORMAL HIGH (ref 70–99)
Potassium: 4.5 mEq/L (ref 3.5–5.1)

## 2011-10-28 MED ORDER — CLOPIDOGREL BISULFATE 75 MG PO TABS: 75.0000 mg | ORAL_TABLET | Freq: Every day | ORAL | Status: DC

## 2011-10-28 NOTE — Patient Instructions (Addendum)
Lab work today. We will call you with results.  Plavix 75mg  every day. Sent to your drug store.  Your physician has requested that you have a peripheral vascular angiogram. This exam is performed at the hospital. During this exam IV contrast is used to look at arterial blood flow. Please review the information sheet given for details.

## 2011-10-28 NOTE — Assessment & Plan Note (Signed)
The patient has severe lifestyle limiting claudication of the left leg with a reduced ABI on that side. She has failed conservative measures. She has undergone evaluation of low back problems and there are no major limitations related to her back. I have recommended proceeding with abdominal aortic angiography and lower extremity runoff with focus on the left leg for consideration of PTCA and stenting in order to improve her symptoms. The patient will be started on Plavix in addition to her aspirin. I have reviewed the risks, indications, and alternatives to angiography with PTA and stenting. Risks include but are not limited to bleeding, infection, embolization, and emergency surgery. The patient and her husband understand and they are agreeable to proceed.

## 2011-10-28 NOTE — Progress Notes (Signed)
HPI:  This is a 76 year old woman presenting for followup evaluation. She has lower extremity peripheral arterial disease and she was seen in December 2012. At that time her ABIs were documented to be 0.79 on the right and 0.64 on the left. Duplex imaging showed moderately severe stenosis in the right SFA and severe stenosis in the left SFA. There was concern about pseudo-claudication from spinal stenosis but the patient underwent evaluation and there was no significant abnormality seen. She continues to have significant limitation and is able to walk only very short distances.  She complains of pain in the left calf region. She does not have significant right leg pain. She denies chest pain or dyspnea.  The patient was tried on Pletal but she was intolerant due to rash and flushing.  Outpatient Encounter Prescriptions as of 10/28/2011  Medication Sig Dispense Refill  . amLODipine (NORVASC) 5 MG tablet 5 mg 2 (two) times daily.       Marland Kitchen aspirin EC 81 MG tablet Take 1 tablet (81 mg total) by mouth daily.      . benzonatate (TESSALON) 200 MG capsule Take 200 mg by mouth as needed.        . carvedilol (COREG) 12.5 MG tablet Take 25 mg by mouth 2 (two) times daily.        . chlorthalidone (HYGROTON) 25 MG tablet Take 1 tablet by mouth Daily.      . cilostazol (PLETAL) 100 MG tablet 1 tab twice a day      . cimetidine (TAGAMET) 400 MG tablet Take 400 mg by mouth as needed.        . citalopram (CELEXA) 20 MG tablet Take 20 mg by mouth daily.        . clonazePAM (KLONOPIN) 0.5 MG tablet Take 0.5 mg by mouth. 1/2 IN THE AM, AND 1 IN THE PM      . estropipate (OGEN 1.25) 1.5 MG tablet Take 1.5 mg by mouth daily.       . fluconazole (DIFLUCAN) 150 MG tablet Take 150 mg by mouth as needed.       Marland Kitchen glimepiride (AMARYL) 1 MG tablet Take 1/2 to 1  tablet daily       . L-Methylfolate-B6-B12 (METANX) 3-35-2 MG TABS Take 1 tablet by mouth 2 (two) times daily.        Marland Kitchen losartan (COZAAR) 50 MG tablet Take 50 mg by mouth  2 (two) times daily.        . metFORMIN (GLUCOPHAGE-XR) 500 MG 24 hr tablet 500 tablets Daily.      Marland Kitchen NIASPAN 500 MG CR tablet 1 tab qhs      . spironolactone (ALDACTONE) 25 MG tablet Take 1 tablet (25 mg total) by mouth daily.  30 tablet  6  . traZODone (DESYREL) 50 MG tablet Take 50 mg by mouth at bedtime.        . clopidogrel (PLAVIX) 75 MG tablet Take 1 tablet (75 mg total) by mouth daily.  30 tablet  3    Allergies  Allergen Reactions  . Ciprofloxacin   . Codeine   . Darvocet (Propoxyphene N-Acetaminophen)   . Darvon   . Hydralazine   . Hydrocodone Nausea And Vomiting  . Morphine And Related   . Novocain   . Potassium-Containing Compounds   . Sulfa Drugs Cross Reactors   . Talwin     Past Medical History  Diagnosis Date  . Balance problems   . Diabetes mellitus   .  Syncope and collapse   . Left bundle branch block   . Renal insufficiency   . Lower extremity edema     CHRONIC  . Depression   . Anxiety   . Obesity   . 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 Hypertension     ROS: Negative except as per HPI  BP 109/60  Pulse 67  Ht 5\' 4"  (1.626 m)  Wt 78.926 kg (174 lb)  BMI 29.87 kg/m2  PHYSICAL EXAM: Pt is alert and oriented, NAD HEENT: normal Neck: JVP - normal, carotids 2+= without bruits Lungs: CTA bilaterally CV: RRR without murmur or gallop Abd: soft, NT, Positive BS, no hepatomegaly Ext: no C/C/E, posterior tibial pulses are nonpalpable, dorsalis pedis pulses are 2+ on the right and 1+ on the left. Skin: warm/dry no rash  ASSESSMENT AND PLAN:

## 2011-11-03 ENCOUNTER — Encounter: Payer: Self-pay | Admitting: Cardiovascular Disease

## 2011-11-03 ENCOUNTER — Encounter (HOSPITAL_COMMUNITY): Payer: Self-pay | Admitting: Pharmacy Technician

## 2011-11-05 ENCOUNTER — Other Ambulatory Visit: Payer: Self-pay | Admitting: Cardiovascular Disease

## 2011-11-09 ENCOUNTER — Encounter (HOSPITAL_COMMUNITY): Payer: Self-pay | Admitting: General Practice

## 2011-11-09 ENCOUNTER — Ambulatory Visit (HOSPITAL_COMMUNITY)
Admission: RE | Admit: 2011-11-09 | Discharge: 2011-11-10 | Disposition: A | Discharge status: Home / Self Care | Payer: Medicare Other | Source: Ambulatory Visit | Attending: Cardiovascular Disease | Admitting: Cardiovascular Disease

## 2011-11-09 ENCOUNTER — Encounter (HOSPITAL_COMMUNITY)
Admission: RE | Disposition: A | Discharge status: Home / Self Care | Payer: Self-pay | Source: Ambulatory Visit | Attending: Cardiovascular Disease

## 2011-11-09 DIAGNOSIS — N289 Disorder of kidney and ureter, unspecified: Secondary | ICD-10-CM | POA: Insufficient documentation

## 2011-11-09 DIAGNOSIS — F329 Major depressive disorder, single episode, unspecified: Secondary | ICD-10-CM | POA: Diagnosis not present

## 2011-11-09 DIAGNOSIS — I10 Essential (primary) hypertension: Secondary | ICD-10-CM | POA: Diagnosis not present

## 2011-11-09 DIAGNOSIS — I708 Atherosclerosis of other arteries: Secondary | ICD-10-CM | POA: Insufficient documentation

## 2011-11-09 DIAGNOSIS — F411 Generalized anxiety disorder: Secondary | ICD-10-CM | POA: Insufficient documentation

## 2011-11-09 DIAGNOSIS — E119 Type 2 diabetes mellitus without complications: Secondary | ICD-10-CM | POA: Diagnosis not present

## 2011-11-09 DIAGNOSIS — I447 Left bundle-branch block, unspecified: Secondary | ICD-10-CM | POA: Insufficient documentation

## 2011-11-09 DIAGNOSIS — E785 Hyperlipidemia, unspecified: Secondary | ICD-10-CM | POA: Insufficient documentation

## 2011-11-09 DIAGNOSIS — I70219 Atherosclerosis of native arteries of extremities with intermittent claudication, unspecified extremity: Secondary | ICD-10-CM | POA: Insufficient documentation

## 2011-11-09 DIAGNOSIS — E669 Obesity, unspecified: Secondary | ICD-10-CM | POA: Diagnosis not present

## 2011-11-09 DIAGNOSIS — E1121 Type 2 diabetes mellitus with diabetic nephropathy: Secondary | ICD-10-CM | POA: Insufficient documentation

## 2011-11-09 DIAGNOSIS — F3289 Other specified depressive episodes: Secondary | ICD-10-CM | POA: Insufficient documentation

## 2011-11-09 DIAGNOSIS — I739 Peripheral vascular disease, unspecified: Secondary | ICD-10-CM | POA: Insufficient documentation

## 2011-11-09 HISTORY — DX: Gastro-esophageal reflux disease without esophagitis: K21.9

## 2011-11-09 HISTORY — DX: Peripheral vascular disease, unspecified: I73.9

## 2011-11-09 HISTORY — PX: ABDOMINAL AORTAGRAM: SHX5454

## 2011-11-09 HISTORY — PX: OTHER SURGICAL HISTORY: SHX169

## 2011-11-09 LAB — GLUCOSE, CAPILLARY: Glucose-Capillary: 99 mg/dL (ref 70–99)

## 2011-11-09 SURGERY — ABDOMINAL AORTAGRAM
Anesthesia: LOCAL

## 2011-11-09 MED ORDER — SODIUM CHLORIDE 0.9 % IJ SOLN: 3.0000 mL | Freq: Two times a day (BID) | INTRAMUSCULAR | Status: DC

## 2011-11-09 MED ORDER — ASPIRIN EC 81 MG PO TBEC
81.0000 mg | DELAYED_RELEASE_TABLET | Freq: Every day | ORAL | Status: DC
  Administered 2011-11-10: 81 mg via ORAL
  Filled 2011-11-09: qty 1

## 2011-11-09 MED ORDER — SODIUM CHLORIDE 0.9 % IJ SOLN: 3.0000 mL | INTRAMUSCULAR | Status: DC | PRN

## 2011-11-09 MED ORDER — SPIRONOLACTONE 25 MG PO TABS
25.0000 mg | ORAL_TABLET | Freq: Every day | ORAL | Status: DC
  Administered 2011-11-09 – 2011-11-10 (×2): 25 mg via ORAL
  Filled 2011-11-09 (×2): qty 1

## 2011-11-09 MED ORDER — CLONAZEPAM 0.5 MG PO TABS: 0.5000 mg | ORAL_TABLET | Freq: Two times a day (BID) | ORAL | Status: DC | PRN

## 2011-11-09 MED ORDER — FENTANYL CITRATE 0.05 MG/ML IJ SOLN
INTRAMUSCULAR | Status: AC
  Filled 2011-11-09: qty 2

## 2011-11-09 MED ORDER — MIDAZOLAM HCL 2 MG/2ML IJ SOLN
INTRAMUSCULAR | Status: AC
  Filled 2011-11-09: qty 2

## 2011-11-09 MED ORDER — SODIUM CHLORIDE 0.9 % IV SOLN: INTRAVENOUS | Status: DC

## 2011-11-09 MED ORDER — HEPARIN (PORCINE) IN NACL 2-0.9 UNIT/ML-% IJ SOLN
INTRAMUSCULAR | Status: AC
  Filled 2011-11-09: qty 1000

## 2011-11-09 MED ORDER — ASPIRIN 81 MG PO CHEW
324.0000 mg | CHEWABLE_TABLET | ORAL | Status: AC
  Administered 2011-11-09: 324 mg via ORAL

## 2011-11-09 MED ORDER — ASPIRIN 81 MG PO CHEW: 81.0000 mg | CHEWABLE_TABLET | Freq: Every day | ORAL | Status: DC

## 2011-11-09 MED ORDER — CLOPIDOGREL BISULFATE 75 MG PO TABS
ORAL_TABLET | ORAL | Status: AC
  Administered 2011-11-09: 75 mg
  Filled 2011-11-09: qty 1

## 2011-11-09 MED ORDER — CARVEDILOL 25 MG PO TABS
25.0000 mg | ORAL_TABLET | Freq: Two times a day (BID) | ORAL | Status: DC
  Administered 2011-11-09 – 2011-11-10 (×2): 25 mg via ORAL
  Filled 2011-11-09 (×4): qty 1

## 2011-11-09 MED ORDER — LIDOCAINE HCL (PF) 1 % IJ SOLN
INTRAMUSCULAR | Status: AC
  Filled 2011-11-09: qty 30

## 2011-11-09 MED ORDER — FAMOTIDINE 20 MG PO TABS
20.0000 mg | ORAL_TABLET | Freq: Every day | ORAL | Status: DC
  Administered 2011-11-10: 20 mg via ORAL
  Filled 2011-11-09: qty 1

## 2011-11-09 MED ORDER — GLIMEPIRIDE 1 MG PO TABS
0.5000 mg | ORAL_TABLET | Freq: Every day | ORAL | Status: DC
  Administered 2011-11-10: 5 mg via ORAL
  Filled 2011-11-09 (×2): qty 0.5

## 2011-11-09 MED ORDER — SODIUM CHLORIDE 0.9 % IV SOLN: 250.0000 mL | INTRAVENOUS | Status: DC | PRN

## 2011-11-09 MED ORDER — CLOPIDOGREL BISULFATE 75 MG PO TABS: 75.0000 mg | ORAL_TABLET | ORAL | Status: DC

## 2011-11-09 MED ORDER — CLOPIDOGREL BISULFATE 75 MG PO TABS
75.0000 mg | ORAL_TABLET | Freq: Every day | ORAL | Status: DC
  Administered 2011-11-10: 75 mg via ORAL
  Filled 2011-11-09 (×2): qty 1

## 2011-11-09 MED ORDER — ACETAMINOPHEN 325 MG PO TABS
650.0000 mg | ORAL_TABLET | ORAL | Status: DC | PRN
  Administered 2011-11-10: 650 mg via ORAL
  Filled 2011-11-09 (×2): qty 2

## 2011-11-09 MED ORDER — SODIUM CHLORIDE 0.9 % IV SOLN
INTRAVENOUS | Status: AC
  Administered 2011-11-09: 17:00:00 via INTRAVENOUS

## 2011-11-09 MED ORDER — CLOPIDOGREL BISULFATE 75 MG PO TABS: 75.0000 mg | ORAL_TABLET | Freq: Every day | ORAL | Status: DC

## 2011-11-09 MED ORDER — DIAZEPAM 5 MG PO TABS
ORAL_TABLET | ORAL | Status: AC
  Filled 2011-11-09: qty 1

## 2011-11-09 MED ORDER — TRAZODONE HCL 50 MG PO TABS
50.0000 mg | ORAL_TABLET | Freq: Every day | ORAL | Status: DC
  Administered 2011-11-09: 50 mg via ORAL
  Filled 2011-11-09 (×2): qty 1

## 2011-11-09 MED ORDER — BENZONATATE 100 MG PO CAPS
200.0000 mg | ORAL_CAPSULE | Freq: Two times a day (BID) | ORAL | Status: DC | PRN
Start: 2011-11-09 — End: 2011-11-10
  Filled 2011-11-09: qty 2

## 2011-11-09 MED ORDER — ASPIRIN 81 MG PO CHEW
CHEWABLE_TABLET | ORAL | Status: AC
  Filled 2011-11-09: qty 1

## 2011-11-09 MED ORDER — ASPIRIN 81 MG PO CHEW: 324.0000 mg | CHEWABLE_TABLET | ORAL | Status: DC

## 2011-11-09 MED ORDER — ESTROPIPATE 1.5 MG PO TABS
1.5000 mg | ORAL_TABLET | Freq: Every day | ORAL | Status: DC
  Administered 2011-11-10: 1.5 mg via ORAL
  Filled 2011-11-09: qty 1

## 2011-11-09 MED ORDER — CLOPIDOGREL BISULFATE 75 MG PO TABS
75.0000 mg | ORAL_TABLET | ORAL | Status: AC
  Administered 2011-11-09: 75 mg via ORAL

## 2011-11-09 MED ORDER — AMLODIPINE BESYLATE 5 MG PO TABS
5.0000 mg | ORAL_TABLET | Freq: Two times a day (BID) | ORAL | Status: DC
  Administered 2011-11-09 – 2011-11-10 (×2): 5 mg via ORAL
  Filled 2011-11-09 (×3): qty 1

## 2011-11-09 MED ORDER — DIAZEPAM 5 MG PO TABS
5.0000 mg | ORAL_TABLET | ORAL | Status: AC
  Administered 2011-11-09: 5 mg via ORAL

## 2011-11-09 MED ORDER — ONDANSETRON HCL 4 MG/2ML IJ SOLN: 4.0000 mg | Freq: Four times a day (QID) | INTRAMUSCULAR | Status: DC | PRN

## 2011-11-09 MED ORDER — ALUM & MAG HYDROXIDE-SIMETH 200-200-20 MG/5ML PO SUSP
30.0000 mL | ORAL | Status: DC | PRN
  Administered 2011-11-09: 30 mL via ORAL
  Filled 2011-11-09: qty 30

## 2011-11-09 MED ORDER — CITALOPRAM HYDROBROMIDE 20 MG PO TABS
20.0000 mg | ORAL_TABLET | Freq: Every day | ORAL | Status: DC
  Administered 2011-11-10: 20 mg via ORAL
  Filled 2011-11-09: qty 1

## 2011-11-09 MED ORDER — ASPIRIN 81 MG PO CHEW
CHEWABLE_TABLET | ORAL | Status: AC
  Administered 2011-11-09: 81 mg
  Filled 2011-11-09: qty 4

## 2011-11-09 NOTE — Procedures (Signed)
Cardiac Catheterization Procedure Note  Name: Doris Carr MRN: [id number] DOB: 1936-03-22  Procedure: Abdominal aortogram, left external iliac angiogram with runoff, left SFA stent, right common iliac stent.  Indication: Doris Carr is a 76 year old woman with severe lifestyle limiting claudication. Her noninvasive studies have suggested a combination of inflow disease and severe left superficial femoral artery stenosis. She has failed medical therapy and claudicate at very short distance. She was referred for angiography with possible PTA and stenting.   Procedural details: The right groin was prepped, draped, and anesthetized with 1% lidocaine. Using modified Seldinger technique, a 5 French sheath was introduced into the right femoral artery. There was difficulty crossing the common iliac artery on the right. I was ultimately able to get across with a Wholey wire. A pigtail catheter was advanced into the suprarenal abdominal aorta and a digitally subtracted abdominal aortogram was performed. A crossover catheter was then utilized to access the left external iliac artery and a endhole catheter was placed. Selective left external iliac angiography was performed with runoff to the left foot. This demonstrated severe stenosis with heavy calcification in the mid left superficial femoral artery. This correlates with the patient's symptoms and her abnormality seen on noninvasive testing. I elected to proceed with PTA and stenting. The 5 French sheath was changed out for a 6 [place] Terumo destination sheath which was placed in the left external iliac artery. The lesion was crossed with a Wholey wire. There was a heavily calcified, 90% stenosis there. A 5 x 40 mm balloon was used to predilate the lesion. The lesion was then stented with a 7 x 60 mm self-expanding stent. The stent was post dilated with a 6 x 40 mm noncompliant balloon. There was an excellent angiographic result. The sheath was then pulled back across  into the ipsilateral common iliac artery and a pullback gradient was measured across the stenosis in the common iliac on the right. There was a 30 mm resting gradient and I elected to stent this vessel. The lesion was primarily stented with an 8 x 20 mm balloon expandable stent to 12 atmospheres. There was an excellent result with no residual gradient. The patient tolerated the procedure well. There were no immediate complications.  Procedural Findings:  The abdominal aorta is patent without aneurysmal formation. There are single renal arteries bilaterally, both are widely patent.  Right leg: The right common iliac artery has severe 80% acentric stenosis with associated calcification. The internal and external iliac arteries are patent. The remaining portion of the right leg was not imaged.  Left leg: The left common iliac has mild ostial stenosis. Just above the iliac bifurcation where it divides into the internal and external iliac, there is a 40-50% stenosis present. The common femoral is calcified. There is no significant stenosis. The deep femoral is patent. The superficial femoral artery has scattered calcific plaque with high-grade stenosis in the mid thigh. The remaining portions of the vessel are calcified but patent. The popliteal artery is patent. The tibioperoneal trunk is patent. The anterior tibial occludes. The peroneal was patent to the ankle. The posterior tibial is also occluded the anterior tibial reconstitutes at the ankle via collaterals  Final Conclusions:   1. Severe calcific left SFA stenosis 2. Severe right common iliac stenosis 3. Successful PTA and stenting of both lesions as detailed above  Recommendations: The patient should remain on dual antiplatelet therapy with aspirin and Plavix for at least 30 days.  Tonny Bollman 11/09/2011, 3:19 PM

## 2011-11-09 NOTE — Progress Notes (Signed)
Site area: right groin  Site Prior to Removal:  Level 0  Pressure Applied For 20 MINUTES    Minutes Beginning at 1819  Manual:   yes  Patient Status During Pull:  stable  Post Pull Groin Site:  Level 0  Post Pull Instructions Given:  yes  Post Pull Pulses Present:  yes  Dressing Applied:  yes  Comments:

## 2011-11-09 NOTE — H&P (View-Only) (Signed)
HPI:  This is a 76-year-old woman presenting for followup evaluation. She has lower extremity peripheral arterial disease and she was seen in December 2012. At that time her ABIs were documented to be 0.79 on the right and 0.64 on the left. Duplex imaging showed moderately severe stenosis in the right SFA and severe stenosis in the left SFA. There was concern about pseudo-claudication from spinal stenosis but the patient underwent evaluation and there was no significant abnormality seen. She continues to have significant limitation and is able to walk only very short distances.  She complains of pain in the left calf region. She does not have significant right leg pain. She denies chest pain or dyspnea.  The patient was tried on Pletal but she was intolerant due to rash and flushing.  Outpatient Encounter Prescriptions as of 10/28/2011  Medication Sig Dispense Refill  . amLODipine (NORVASC) 5 MG tablet 5 mg 2 (two) times daily.       . aspirin EC 81 MG tablet Take 1 tablet (81 mg total) by mouth daily.      . benzonatate (TESSALON) 200 MG capsule Take 200 mg by mouth as needed.        . carvedilol (COREG) 12.5 MG tablet Take 25 mg by mouth 2 (two) times daily.        . chlorthalidone (HYGROTON) 25 MG tablet Take 1 tablet by mouth Daily.      . cilostazol (PLETAL) 100 MG tablet 1 tab twice a day      . cimetidine (TAGAMET) 400 MG tablet Take 400 mg by mouth as needed.        . citalopram (CELEXA) 20 MG tablet Take 20 mg by mouth daily.        . clonazePAM (KLONOPIN) 0.5 MG tablet Take 0.5 mg by mouth. 1/2 IN THE AM, AND 1 IN THE PM      . estropipate (OGEN 1.25) 1.5 MG tablet Take 1.5 mg by mouth daily.       . fluconazole (DIFLUCAN) 150 MG tablet Take 150 mg by mouth as needed.       . glimepiride (AMARYL) 1 MG tablet Take 1/2 to 1  tablet daily       . L-Methylfolate-B6-B12 (METANX) 3-35-2 MG TABS Take 1 tablet by mouth 2 (two) times daily.        . losartan (COZAAR) 50 MG tablet Take 50 mg by mouth  2 (two) times daily.        . metFORMIN (GLUCOPHAGE-XR) 500 MG 24 hr tablet 500 tablets Daily.      . NIASPAN 500 MG CR tablet 1 tab qhs      . spironolactone (ALDACTONE) 25 MG tablet Take 1 tablet (25 mg total) by mouth daily.  30 tablet  6  . traZODone (DESYREL) 50 MG tablet Take 50 mg by mouth at bedtime.        . clopidogrel (PLAVIX) 75 MG tablet Take 1 tablet (75 mg total) by mouth daily.  30 tablet  3    Allergies  Allergen Reactions  . Ciprofloxacin   . Codeine   . Darvocet (Propoxyphene N-Acetaminophen)   . Darvon   . Hydralazine   . Hydrocodone Nausea And Vomiting  . Morphine And Related   . Novocain   . Potassium-Containing Compounds   . Sulfa Drugs Cross Reactors   . Talwin     Past Medical History  Diagnosis Date  . Balance problems   . Diabetes mellitus   .   Syncope and collapse   . Left bundle branch block   . Renal insufficiency   . Lower extremity edema     CHRONIC  . Depression   . Anxiety   . Obesity   . 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 Hypertension     ROS: Negative except as per HPI  BP 109/60  Pulse 67  Ht 5' 4" (1.626 m)  Wt 78.926 kg (174 lb)  BMI 29.87 kg/m2  PHYSICAL EXAM: Pt is alert and oriented, NAD HEENT: normal Neck: JVP - normal, carotids 2+= without bruits Lungs: CTA bilaterally CV: RRR without murmur or gallop Abd: soft, NT, Positive BS, no hepatomegaly Ext: no C/C/E, posterior tibial pulses are nonpalpable, dorsalis pedis pulses are 2+ on the right and 1+ on the left. Skin: warm/dry no rash  ASSESSMENT AND PLAN:  

## 2011-11-09 NOTE — Interval H&P Note (Signed)
History and Physical Interval Note:  11/09/2011 2:01 PM  Doris Carr  has presented today for surgery, with the diagnosis of Hardening of the arteries  The various methods of treatment have been discussed with the patient and family. After consideration of risks, benefits and other options for treatment, the patient has consented to  Procedure(s): ABDOMINAL AORTAGRAM as a surgical intervention .  The patients' history has been reviewed, patient examined, no change in status, stable for surgery.  I have reviewed the patients' chart and labs.  Questions were answered to the patient's satisfaction.     Tonny Bollman

## 2011-11-09 NOTE — Plan of Care (Signed)
Problem: Phase I Progression Outcomes Goal: Voiding-avoid urinary catheter unless indicated Outcome: Progressing Pt has foley catheter that was put in in the cath lab per patient request.  Pt requested that the foley be kept in until morning.

## 2011-11-10 DIAGNOSIS — I739 Peripheral vascular disease, unspecified: Secondary | ICD-10-CM

## 2011-11-10 LAB — CBC
MCH: 32 pg (ref 26.0–34.0)
MCHC: 33.7 g/dL (ref 30.0–36.0)
Platelets: 148 10*3/uL — ABNORMAL LOW (ref 150–400)
RBC: 3.87 MIL/uL (ref 3.87–5.11)
RDW: 12 % (ref 11.5–15.5)

## 2011-11-10 LAB — BASIC METABOLIC PANEL
BUN: 24 mg/dL — ABNORMAL HIGH (ref 6–23)
Calcium: 9.3 mg/dL (ref 8.4–10.5)
GFR calc non Af Amer: 57 mL/min — ABNORMAL LOW (ref >90)
Glucose, Bld: 229 mg/dL — ABNORMAL HIGH (ref 70–99)
Sodium: 136 mEq/L (ref 135–145)

## 2011-11-10 NOTE — Progress Notes (Signed)
Subjective:  Legs feel better. Didn't sleep well. No chest pain or dyspnea.  Objective:  Vital Signs in the last 24 hours: Temp:  [96.8 F (36 C)-98.2 F (36.8 C)] 98 F (36.7 C) (02/07 0515) Pulse Rate:  [57-64] 61  (02/07 0515) Resp:  [15-18] 17  (02/07 0515) BP: (107-150)/(37-80) 124/46 mmHg (02/07 0515) SpO2:  [94 %-97 %] 95 % (02/07 0515) Weight:  [77.111 kg (170 lb)] 77.111 kg (170 lb) (02/06 1104)  Intake/Output from previous day: 02/06 0701 - 02/07 0700 In: 440 [P.O.:240; I.V.:200] Out: 1850 [Urine:1850]  Physical Exam: Pt is alert and oriented, NAD HEENT: normal Neck: JVP - normal Lungs: CTA bilaterally CV: RRR without murmur or gallop Abd: soft, NT, Positive BS, no hepatomegaly Ext: no C/C/E, distal pulses intact and equal. Right groin site clear Skin: warm/dry no rash   Lab Results: No results found for this basename: WBC:2,HGB:2,PLT:2 in the last 72 hours No results found for this basename: NA:2,K:2,CL:2,CO2:2,GLUCOSE:2,BUN:2,CREATININE:2 in the last 72 hours No results found for this basename: TROPONINI:2,CK,MB:2 in the last 72 hours  Assessment/Plan:  PAD with intermittent claudication. S/P left SFA and right common iliac stenting Feb 6. Kept overnight for hydration in setting diabetes/renal insufficiency. BMET pending this am. Continue ASA and plavix at least 30 days and favor 6 months if tolerated. Follow-up ABI's and OV 2 weeks. OK to discharge home after BMET back. Continue all home meds without changes.  Tonny Bollman, M.D. 11/10/2011, 7:04 AM

## 2011-11-10 NOTE — Plan of Care (Signed)
Problem: Phase II Progression Outcomes Goal: Ambulates up to 600 ft. in hall x 1 Outcome: Completed/Met Date Met:  11/10/11 Patient ambulated 200 ft in hall with no adverse symptoms, no claudication and stated it felt wonderful. Patient had steady gait with ambulation and no shortness of breath or pain. She stated "I have no pain."

## 2011-11-10 NOTE — Discharge Summary (Signed)
Discharge Summary   Patient ID: Doris Carr,  MRN: [id number], DOB/AGE: June 24, 1936 76 y.o.  Admit date: 11/09/2011 Discharge date: 11/10/2011  Discharge Diagnoses Principal Problem:  *Atherosclerosis of native arteries of the extremities with intermittent claudication Active Problems:  Diabetes mellitus  Renal insufficiency  HTN (hypertension)  Hyperlipidemia  Allergies Allergies  Allergen Reactions  . Ciprofloxacin   . Codeine   . Darvocet (Propoxyphene N-Acetaminophen)   . Darvon   . Hydralazine   . Hydrocodone Nausea And Vomiting  . Morphine And Related   . Novocain   . Potassium-Containing Compounds   . Sulfa Drugs Cross Reactors   . Talwin    Procedures  Abdominal aortogram, left external iliac angiogram, left SFA stent, R common iliac stent  Procedural Findings: The abdominal aorta is patent without aneurysmal formation. There are single renal arteries bilaterally, both are widely patent.  Right leg: The right common iliac artery has severe 80% acentric stenosis with associated calcification. The internal and external iliac arteries are patent. The remaining portion of the right leg was not imaged.  Left leg: The left common iliac has mild ostial stenosis. Just above the iliac bifurcation where it divides into the internal and external iliac, there is a 40-50% stenosis present. The common femoral is calcified. There is no significant stenosis. The deep femoral is patent. The superficial femoral artery has scattered calcific plaque with high-grade stenosis in the mid thigh. The remaining portions of the vessel are calcified but patent. The popliteal artery is patent. The tibioperoneal trunk is patent. The anterior tibial occludes. The peroneal was patent to the ankle. The posterior tibial is also occluded the anterior tibial reconstitutes at the ankle via collaterals  Final Conclusions:  1. Severe calcific left SFA stenosis  2. Severe right common iliac stenosis  3.  Successful PTA and stenting of both lesions as detailed above  Recommendations: The patient should remain on dual antiplatelet therapy with aspirin and Plavix for at least 30 days.  History of Present Illness  Doris Carr is a 75 yo female with PMHx significant for PAD, type 2 DM, HTN and HL who presented to [facility] for elective peripheral vascular catheterization +/- intervention for PAD with claudication.   Per recent office note by Dr. Excell Seltzer: she was seen in December 2012. At that time her ABIs were documented to be 0.79 on the right and 0.64 on the left. Duplex imaging showed moderately severe stenosis in the right SFA and severe stenosis in the left SFA. There was concern about pseudo-claudication from spinal stenosis but the patient underwent evaluation and there was no significant abnormality seen. She continues to have significant limitation and is able to walk only very short distances. She complains of pain in the left calf region. She does not have significant right leg pain. She denies chest pain or dyspnea. The patient was tried on Pletal but she was intolerant due to rash and flushing.  Hospital Course   She was informed, consented and agreed to the procedure which elicited the above details including severe calcific left SFA stenosis, severe right common iliac stenosis and successful PTA and stenting both lesions as detailed above. The recommendation was made to continue DAPT x ASA/Plavix for at least 30 days, preferably 6 months. The patient tolerated the procedure well with no immediate complications. She was kept overnight for hydration in the setting of renal insufficiency/diabetes. BMET this morning reveals BUN/Cr WNL. Today she is stable, at baseline and will be discharged. She will  follow-up with Dr. Excell Seltzer in approx 2 weeks with ABIs. She will resume home medications in addition to ASA/Plavix.   Discharge Vitals:  Blood pressure 116/63, pulse 66, temperature 97.4 F (36.3 C),  temperature source Oral, resp. rate 14, height 5' 4.5" (1.638 m), weight 78.6 kg (173 lb 4.5 oz), SpO2 99.00%.   Labs: Recent Labs  [facility] 11/10/11 0940   WBC 6.2   HGB 12.4   HCT 36.8   MCV 95.1   PLT 148*    Lab 11/10/11 0940  NA 136  K 4.7  CL 102  CO2 26  BUN 24*  CREATININE 0.96  CALCIUM 9.3  PROT --  BILITOT --  ALKPHOS --  ALT --  AST --  AMYLASE --  LIPASE --  GLUCOSE 229*   Disposition:  Discharge Orders    Future Appointments: Provider: Department: Dept Phone: Center:   12/01/2011 11:00 AM Melissa C. Al-Rammal [facility]  None   12/01/2011 12:30 PM Beatrice Lecher, PA [facility] [phone] [facility]   12/12/2011 10:30 AM Marca Ancona, MD [facility] [phone] [facility]     Follow-up Information    Follow up with Tonny Bollman, MD. (28th at 11:00 (ABIs) 12:30)    Contact information:   [address] [place] [address] [phone]         Discharge Medications:  Medication List  As of 11/10/2011 12:36 PM   CONTINUE taking these medications         amLODipine 5 MG tablet   Commonly known as: NORVASC      aspirin EC 81 MG tablet      benzonatate 200 MG capsule   Commonly known as: TESSALON      carvedilol 12.5 MG tablet   Commonly known as: COREG      chlorthalidone 25 MG tablet   Commonly known as: HYGROTON      cimetidine 400 MG tablet   Commonly known as: TAGAMET      citalopram 20 MG tablet   Commonly known as: CELEXA      clonazePAM 0.5 MG tablet   Commonly known as: KLONOPIN      clopidogrel 75 MG tablet   Commonly known as: PLAVIX   Take 1 tablet (75 mg total) by mouth daily.      fluconazole 150 MG tablet   Commonly known as: DIFLUCAN      glimepiride 1 MG tablet   Commonly known as: AMARYL      losartan 50 MG tablet   Commonly known as: COZAAR      metFORMIN 500 MG 24 hr tablet   Commonly known as: GLUCOPHAGE-XR      multivitamin 3-35-2  MG Tabs tablet      OGEN 1.25 1.5 MG tablet   Generic drug: estropipate      spironolactone 25 MG tablet   Commonly known as: ALDACTONE   Take 1 tablet (25 mg total) by mouth daily.      traZODone 50 MG tablet   Commonly known as: DESYREL           Outstanding Labs/Studies: None  Duration of Discharge Encounter: 40 minutes including physician time.  Signed, R. Hurman Horn, PA-C 11/10/2011, 12:36 PM

## 2011-11-10 NOTE — Plan of Care (Signed)
Problem: Phase I Progression Outcomes Goal: Voiding-avoid urinary catheter unless indicated Outcome: Completed/Met Date Met:  11/10/11 Urinary catheter discontinued at 650 and patient voided prior to 730 with no issues.

## 2011-11-11 LAB — POCT ACTIVATED CLOTTING TIME: Activated Clotting Time: 166 seconds

## 2011-11-11 NOTE — Discharge Summary (Signed)
Please see my progress note this same date. Agree with documentation as outlined.

## 2011-11-24 ENCOUNTER — Other Ambulatory Visit: Payer: Self-pay | Admitting: Cardiology

## 2011-11-24 ENCOUNTER — Other Ambulatory Visit: Payer: Self-pay | Admitting: *Deleted

## 2011-11-24 DIAGNOSIS — B351 Tinea unguium: Secondary | ICD-10-CM | POA: Diagnosis not present

## 2011-11-24 DIAGNOSIS — F411 Generalized anxiety disorder: Secondary | ICD-10-CM | POA: Diagnosis not present

## 2011-11-24 DIAGNOSIS — M26629 Arthralgia of temporomandibular joint, unspecified side: Secondary | ICD-10-CM | POA: Diagnosis not present

## 2011-11-24 DIAGNOSIS — I739 Peripheral vascular disease, unspecified: Secondary | ICD-10-CM

## 2011-11-24 DIAGNOSIS — I10 Essential (primary) hypertension: Secondary | ICD-10-CM | POA: Diagnosis not present

## 2011-11-24 MED ORDER — CHLORTHALIDONE 25 MG PO TABS: 25.0000 mg | ORAL_TABLET | Freq: Every day | ORAL | Status: DC

## 2011-12-01 ENCOUNTER — Encounter (INDEPENDENT_AMBULATORY_CARE_PROVIDER_SITE_OTHER): Payer: Medicare Other

## 2011-12-01 ENCOUNTER — Ambulatory Visit (INDEPENDENT_AMBULATORY_CARE_PROVIDER_SITE_OTHER): Payer: Medicare Other | Admitting: Physician Assistant

## 2011-12-01 ENCOUNTER — Encounter: Payer: Self-pay | Admitting: Physician Assistant

## 2011-12-01 VITALS — BP 110/58 | HR 55 | Ht 64.0 in | Wt 175.4 lb

## 2011-12-01 DIAGNOSIS — I739 Peripheral vascular disease, unspecified: Secondary | ICD-10-CM

## 2011-12-01 DIAGNOSIS — I509 Heart failure, unspecified: Secondary | ICD-10-CM

## 2011-12-01 DIAGNOSIS — I70219 Atherosclerosis of native arteries of extremities with intermittent claudication, unspecified extremity: Secondary | ICD-10-CM

## 2011-12-01 DIAGNOSIS — I5032 Chronic diastolic (congestive) heart failure: Secondary | ICD-10-CM

## 2011-12-01 DIAGNOSIS — I10 Essential (primary) hypertension: Secondary | ICD-10-CM

## 2011-12-01 NOTE — Assessment & Plan Note (Signed)
Doing well post angioplasty.  ABIs now normal.  She does still have some residual right leg pain.  However, this is much improved.  Followup with Dr. Excell Seltzer in 3 months.

## 2011-12-01 NOTE — Assessment & Plan Note (Signed)
Controlled.  

## 2011-12-01 NOTE — Assessment & Plan Note (Signed)
Volume stable. 

## 2011-12-01 NOTE — Progress Notes (Signed)
[address] [place], [place]  [address] Phone: [phone] Fax:  [phone]  Date:  12/01/2011   Name:  Doris Carr       DOB:  01/21/1936 MRN:  [id number]  PCP:  Dr. Vianne Bulls Primary Cardiologist:  Dr. Marca Ancona  Primary Electrophysiologist:  None PV:  Dr. Tonny Bollman   History of Present Illness: Doris Carr is a 76 y.o. female who presents for post hospital follow up.    She has a history of CKD, diabetes, chronic LBBB, and difficult to control HTN.   She saw Dr. Excell Seltzer recently for claudication.  ABIs were abnormal with 0.79 on the right and 0.64 on the left.  She was admitted 2/6-2/7 for distal arteriogram.  This demonstrated 80% stenosis in the right common iliac, 40-50% in the left common iliac and high-grade stenosis in the mid thigh of the superficial femoral artery.  She underwent stenting of the left SFA and stenting of the right common iliac.  Dual antiplatelet therapy was recommended for minimum of 30 days and preference of 6 months.  Labs: Hemoglobin 12.4, potassium 4.7, creatinine 0.96.  She underwent followup ABIs earlier today: Right 0.90, left 0.96  She is doing well.  Her claudication symptoms have resolved.  She continues to feel some pain in her right leg around her knee at night.  This is improved.  She states that she feels as though she has gotten her life back.  The patient denies chest pain, shortness of breath, syncope, orthopnea, PND or significant pedal edema.   Past Medical History  Diagnosis Date  . Balance problems   . Syncope and collapse   . Left bundle branch block   . CKD (chronic kidney disease)     likely due to DM2 and HTN  . Lower extremity edema     CHRONIC  . Depression   . Anxiety   . Obesity   . 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 Hypertension     This has been difficult to control. She has had renal artery doppler evaluation on two different occasions, both times  with no evidence for renal artery stenosis. She has had urinary catecholeamine collection that was unremarkable. Edema with 10 mg amlodipine.   . Peripheral vascular disease     Lower extremity evaluation (12/12): ABI 0.79 right, 0.64 left; > 50% mid left SFA stenosis;  s/p L SFA stent and stent to R CIA 11/2011; ABI's post PTA - R 0.90, L 0.96 (TBI on R 0.51, L 0.61)  . GERD (gastroesophageal reflux disease)   . Diabetes mellitus     type  2  . Lung nodule     VATS and biopsy in 2006 that was negative for malignancy  . S/P cardiac catheterization     Left heart cath in 6/06 with normal coronaries, EF 60%.  . Back pain     sees a neurosurgeon  . HLD (hyperlipidemia)     myalgias with statins  . Chronic diastolic heart failure     Echo (8/12) with EF 60-65%, moderate LV hypertrophy, mild LVH    Current Outpatient Prescriptions  Medication Sig Dispense Refill  . amLODipine (NORVASC) 5 MG tablet Take 5 mg by mouth 2 (two) times daily.       Marland Kitchen aspirin EC 81 MG tablet Take 1 tablet (81 mg total) by mouth daily.      Marland Kitchen  benzonatate (TESSALON) 200 MG capsule Take 200 mg by mouth 2 (two) times daily as needed. For cough      . carvedilol (COREG) 12.5 MG tablet Take 25 mg by mouth 2 (two) times daily.        . chlorthalidone (HYGROTON) 25 MG tablet Take 1 tablet (25 mg total) by mouth daily.  30 tablet  6  . cilostazol (PLETAL) 100 MG tablet Take 100 mg by mouth daily.       . cimetidine (TAGAMET) 400 MG tablet Take 400 mg by mouth 4 (four) times daily as needed. For heartburn      . citalopram (CELEXA) 20 MG tablet Take 20 mg by mouth daily.        . clonazePAM (KLONOPIN) 0.5 MG tablet Take 0.5 mg by mouth 2 (two) times daily as needed. 0.5 tab in the am, 1 tab in the pm      . clopidogrel (PLAVIX) 75 MG tablet Take 1 tablet (75 mg total) by mouth daily.  30 tablet  3  . estropipate (OGEN 1.25) 1.5 MG tablet Take 1.5 mg by mouth daily.       . fluconazole (DIFLUCAN) 150 MG tablet Take 150 mg by  mouth daily as needed. For yeast infections      . glimepiride (AMARYL) 1 MG tablet Take 0.5-1 mg by mouth daily before breakfast. Depends on sugar      . L-Methylfolate-B6-B12 (METANX) 3-35-2 MG TABS Take 1 tablet by mouth 2 (two) times daily.        Marland Kitchen losartan (COZAAR) 50 MG tablet Take 50 mg by mouth 2 (two) times daily.        . metFORMIN (GLUCOPHAGE-XR) 500 MG 24 hr tablet Take 500 mg by mouth Daily.       Marland Kitchen spironolactone (ALDACTONE) 25 MG tablet Take 1 tablet (25 mg total) by mouth daily.  30 tablet  6  . terbinafine (LAMISIL) 250 MG tablet Take 250 mg by mouth daily.      . traZODone (DESYREL) 50 MG tablet Take 50 mg by mouth at bedtime.       Marland Kitchen DISCONTD: carvedilol (COREG) 12.5 MG tablet Take 1 tablet (12.5 mg total) by mouth 2 (two) times daily.  60 tablet  6    Allergies: Allergies  Allergen Reactions  . Ciprofloxacin   . Codeine   . Darvocet (Propoxyphene N-Acetaminophen)   . Darvon   . Hydralazine   . Hydrocodone Nausea And Vomiting  . Morphine And Related   . Novocain   . Potassium-Containing Compounds   . Sulfa Drugs Cross Reactors   . Talwin     History  Substance Use Topics  . Smoking status: Former Smoker -- 0.8 packs/day for 15 years    Types: Cigarettes    Quit date: 11/03/1984  . Smokeless tobacco: Never Used  . Alcohol Use: No     PHYSICAL EXAM: VS:  BP 110/58  Pulse 55  Ht 5\' 4"  (1.626 m)  Wt 175 lb 6.4 oz (79.561 kg)  BMI 30.11 kg/m2 Well nourished, well developed, in no acute distress HEENT: normal Neck: no JVD Cardiac:  normal S1, S2; RRR; no murmur Lungs:  clear to auscultation bilaterally, no wheezing, rhonchi or rales Abd: soft, nontender, no hepatomegaly Ext: no edema; right groin without hematoma or bruit  Vascular:  No FA bruits bilaterally; popliteal and DP/PT pulses 2+ bilaterally  Skin: warm and dry Neuro:  CNs 2-12 intact, no focal abnormalities noted  EKG:  Sinus brady, HR 55, LBBB  ASSESSMENT AND PLAN:

## 2011-12-01 NOTE — Patient Instructions (Signed)
Your physician recommends that you schedule a follow-up appointment in: 3 months with Dr. Cooper.  

## 2011-12-12 ENCOUNTER — Ambulatory Visit (INDEPENDENT_AMBULATORY_CARE_PROVIDER_SITE_OTHER): Payer: Medicare Other | Admitting: Cardiology

## 2011-12-12 ENCOUNTER — Encounter: Payer: Self-pay | Admitting: Cardiology

## 2011-12-12 VITALS — BP 119/61 | HR 58 | Ht 64.0 in | Wt 172.0 lb

## 2011-12-12 DIAGNOSIS — I70219 Atherosclerosis of native arteries of extremities with intermittent claudication, unspecified extremity: Secondary | ICD-10-CM | POA: Diagnosis not present

## 2011-12-12 DIAGNOSIS — E785 Hyperlipidemia, unspecified: Secondary | ICD-10-CM | POA: Diagnosis not present

## 2011-12-12 DIAGNOSIS — I10 Essential (primary) hypertension: Secondary | ICD-10-CM | POA: Diagnosis not present

## 2011-12-12 NOTE — Assessment & Plan Note (Signed)
She has not been able to tolerate any statin due to muscle weakness.  She did not tolerate Zetia.  She was also not able to tolerate Niaspan.  Needs to watch diet carefully.

## 2011-12-12 NOTE — Assessment & Plan Note (Signed)
BP well-controlled.  No renal artery stenosis on renal angiogram.  K and creatinine ok in 2/13.  Continue current meds.  Will need periodic BMETs given use of spironolactone.  Will check BMET again in 5/13.

## 2011-12-12 NOTE — Progress Notes (Signed)
PCP: Dr. Tenny Craw  76 yo with history of CKD, diabetes, chronic LBBB, PAD, and difficult to control HTN presents for cardiology followup.  Blood pressure has been better since adding spironolactone and is within normal range today.  Since I saw her last, she had a lower extremity angiogram with PTA and stenting of the left SFA and right CIA.  Renal arteries had no significant stenosis.  She has done quite well since the procedure.  She does not have claudication now.  She does get some lateral left lower leg pain at night while in bed but has no exertional symptoms.  She is back at the gym and walking for 5 minutes on a treadmill.  She also rides on the exercise bike.  No exertional dyspnea, no chest pain.  She was unable to take Niaspan due to flushing.   ECG: NSR, LBBB (chronic)  Labs (7/12): HgbA1c 5.9, K 3.8, creatinine 1.49, BUN 47 Labs (8/12): BNP 108, K 3.7, creatinine 1.3 => 1.4 Labs (12/12): K 4.5, creatinine 1.4 Labs (2/13): K 4.7, creatinine 0.96  PMH: 1.  HTN: This has been difficult to control.  She has had renal artery doppler evaluation on two different occasions, both times with no evidence for renal artery stenosis.  She has had urinary catecholeamine collection that was unremarkable.  Edema with 10 mg amlodipine.  Renal artery angiogram in 2/13 showed no renal artery stenosis.  2. Diabetes mellitus type II 3. CKD: Likely due to diabetes and HTN. 4. Chronic LBBB 5. Lung nodule with VATS and biopsy in 2006 that was negative for malignancy.  6. Depression 7. Anxiety 8. Obesity 9. Left heart cath in 6/06 with normal coronaries, EF 60%.  10.  Back pain: Sees a neurosurgeon.  11.  Hyperlipidemia: Myalgias with statins.  12.  Diastolic CHF: Echo (8/12) with EF 60-65%, moderate LV hypertrophy, mild LVH. 13.  PAD: Normal ABIs at [facility] in 6/11.  Lower extremity evaluation (12/12): ABI 0.79 right, 0.64 left; > 50% mid left SFA stenosis.  Peripheral angiogram in 2/13 with PTA and stenting of  the left SFA and right CIA.   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  Nonsmoker.    FH: Mother with pacemaker and Alzheimer's-type dementia.    Current Outpatient Prescriptions  Medication Sig Dispense Refill  . amLODipine (NORVASC) 5 MG tablet Take 5 mg by mouth 2 (two) times daily.       Marland Kitchen aspirin EC 81 MG tablet Take 1 tablet (81 mg total) by mouth daily.      . benzonatate (TESSALON) 200 MG capsule Take 200 mg by mouth 2 (two) times daily as needed. For cough      . carvedilol (COREG) 12.5 MG tablet Take 25 mg by mouth 2 (two) times daily.        . chlorthalidone (HYGROTON) 25 MG tablet Take 1 tablet (25 mg total) by mouth daily.  30 tablet  6  . cilostazol (PLETAL) 100 MG tablet Take 100 mg by mouth daily.       . cimetidine (TAGAMET) 400 MG tablet Take 400 mg by mouth 4 (four) times daily as needed. For heartburn      . citalopram (CELEXA) 20 MG tablet Take 20 mg by mouth daily.        . clonazePAM (KLONOPIN) 0.5 MG tablet Take 0.5 mg by mouth 2 (two) times daily as needed. 0.5 tab in the am, 1 tab in the pm      .  clopidogrel (PLAVIX) 75 MG tablet Take 1 tablet (75 mg total) by mouth daily.  30 tablet  3  . estropipate (OGEN 1.25) 1.5 MG tablet Take 1.5 mg by mouth daily.       . fluconazole (DIFLUCAN) 150 MG tablet Take 150 mg by mouth daily as needed. For yeast infections      . glimepiride (AMARYL) 1 MG tablet Take 0.5-1 mg by mouth daily before breakfast. Depends on sugar      . L-Methylfolate-B6-B12 (METANX) 3-35-2 MG TABS Take 1 tablet by mouth 2 (two) times daily.        Marland Kitchen losartan (COZAAR) 50 MG tablet Take 50 mg by mouth 2 (two) times daily.        . metFORMIN (GLUCOPHAGE-XR) 500 MG 24 hr tablet Take 500 mg by mouth Daily.       Marland Kitchen spironolactone (ALDACTONE) 25 MG tablet Take 1 tablet (25 mg total) by mouth daily.  30 tablet  6  . terbinafine (LAMISIL) 250 MG tablet Take 250 mg by mouth daily.      . traZODone (DESYREL) 50 MG tablet Take 50 mg by mouth at  bedtime.       Marland Kitchen DISCONTD: carvedilol (COREG) 12.5 MG tablet Take 1 tablet (12.5 mg total) by mouth 2 (two) times daily.  60 tablet  6    BP 119/61  Pulse 58  Ht 5\' 4"  (1.626 m)  Wt 172 lb (78.019 kg)  BMI 29.52 kg/m2 General: NAD, overweight Neck: No JVD, no thyromegaly or thyroid nodule.  Lungs: Clear to auscultation bilaterally with normal respiratory effort. CV: Nondisplaced PMI.  Heart regular S1/S2, soft S4, no murmur.  No edema.  No carotid bruit.  Trace PT pulses, no foot ulcerations. Bilateral lower leg venous varicosities.  Abdomen: Soft, nontender, no hepatosplenomegaly, no distention.   Neurologic: Alert and oriented x 3.  Psych: Normal affect. Extremities: No clubbing or cyanosis.

## 2011-12-12 NOTE — Assessment & Plan Note (Signed)
Claudication much improved since stenting of the left SFA and right CIA.  She seems essentially asymptomatic from this standpoint.  Continue Plavix and ASA.  She cannot take statins.   

## 2011-12-12 NOTE — Patient Instructions (Signed)
Your physician recommends that you return for lab work in: May 2013--BMET 401.9  Your physician recommends that you schedule a follow-up appointment in: June 2013 with Dr Shirlee Latch.

## 2012-01-23 DIAGNOSIS — M549 Dorsalgia, unspecified: Secondary | ICD-10-CM | POA: Diagnosis not present

## 2012-01-23 DIAGNOSIS — I10 Essential (primary) hypertension: Secondary | ICD-10-CM | POA: Diagnosis not present

## 2012-01-23 DIAGNOSIS — E119 Type 2 diabetes mellitus without complications: Secondary | ICD-10-CM | POA: Diagnosis not present

## 2012-02-23 ENCOUNTER — Other Ambulatory Visit: Payer: Self-pay | Admitting: Cardiology

## 2012-02-23 DIAGNOSIS — I700 Atherosclerosis of aorta: Secondary | ICD-10-CM

## 2012-02-28 ENCOUNTER — Other Ambulatory Visit: Payer: Medicare Other

## 2012-02-28 DIAGNOSIS — Z1231 Encounter for screening mammogram for malignant neoplasm of breast: Secondary | ICD-10-CM | POA: Diagnosis not present

## 2012-03-01 ENCOUNTER — Other Ambulatory Visit (INDEPENDENT_AMBULATORY_CARE_PROVIDER_SITE_OTHER): Payer: Medicare Other

## 2012-03-01 ENCOUNTER — Other Ambulatory Visit: Payer: Self-pay | Admitting: *Deleted

## 2012-03-01 ENCOUNTER — Ambulatory Visit (INDEPENDENT_AMBULATORY_CARE_PROVIDER_SITE_OTHER): Payer: Medicare Other | Admitting: Cardiovascular Disease

## 2012-03-01 ENCOUNTER — Encounter (INDEPENDENT_AMBULATORY_CARE_PROVIDER_SITE_OTHER): Payer: Medicare Other

## 2012-03-01 ENCOUNTER — Encounter: Payer: Self-pay | Admitting: Cardiovascular Disease

## 2012-03-01 VITALS — BP 118/66 | HR 60 | Ht 64.0 in | Wt 167.0 lb

## 2012-03-01 DIAGNOSIS — I700 Atherosclerosis of aorta: Secondary | ICD-10-CM

## 2012-03-01 DIAGNOSIS — I70219 Atherosclerosis of native arteries of extremities with intermittent claudication, unspecified extremity: Secondary | ICD-10-CM | POA: Diagnosis not present

## 2012-03-01 DIAGNOSIS — I739 Peripheral vascular disease, unspecified: Secondary | ICD-10-CM

## 2012-03-01 DIAGNOSIS — R928 Other abnormal and inconclusive findings on diagnostic imaging of breast: Secondary | ICD-10-CM

## 2012-03-01 DIAGNOSIS — I10 Essential (primary) hypertension: Secondary | ICD-10-CM

## 2012-03-01 LAB — BASIC METABOLIC PANEL
BUN: 40 mg/dL — ABNORMAL HIGH (ref 6–23)
Calcium: 9.6 mg/dL (ref 8.4–10.5)
GFR: 41.43 mL/min — ABNORMAL LOW (ref >60.00)
Potassium: 4.9 mEq/L (ref 3.5–5.1)

## 2012-03-01 NOTE — Patient Instructions (Signed)
Your physician wants you to follow-up in: 6 months with Dr. Cooper.  You will receive a reminder letter in the mail two months in advance. If you don't receive a letter, please call our office to schedule the follow-up appointment.   

## 2012-03-02 ENCOUNTER — Other Ambulatory Visit: Payer: Self-pay | Admitting: Obstetrics and Gynecology

## 2012-03-02 ENCOUNTER — Encounter (INDEPENDENT_AMBULATORY_CARE_PROVIDER_SITE_OTHER): Payer: Medicare Other

## 2012-03-02 ENCOUNTER — Encounter: Payer: Self-pay | Admitting: Obstetrics and Gynecology

## 2012-03-02 DIAGNOSIS — I70219 Atherosclerosis of native arteries of extremities with intermittent claudication, unspecified extremity: Secondary | ICD-10-CM

## 2012-03-02 DIAGNOSIS — R928 Other abnormal and inconclusive findings on diagnostic imaging of breast: Secondary | ICD-10-CM

## 2012-03-02 DIAGNOSIS — I739 Peripheral vascular disease, unspecified: Secondary | ICD-10-CM | POA: Diagnosis not present

## 2012-03-03 ENCOUNTER — Other Ambulatory Visit: Payer: Self-pay | Admitting: *Deleted

## 2012-03-03 MED ORDER — LOSARTAN POTASSIUM 50 MG PO TABS: 50.0000 mg | ORAL_TABLET | Freq: Two times a day (BID) | ORAL | Status: DC

## 2012-03-07 ENCOUNTER — Other Ambulatory Visit: Payer: Self-pay | Admitting: Cardiovascular Disease

## 2012-03-08 ENCOUNTER — Encounter: Payer: Self-pay | Admitting: Cardiovascular Disease

## 2012-03-08 NOTE — Progress Notes (Signed)
HPI:  75 year old woman presenting for followup evaluation. The patient is followed for lower extremity peripheral arterial disease. She has undergone stenting of the right common iliac artery and left superficial femoral artery for treatment of severe intermittent claudication. She has had an excellent clinical response with resolution of her leg pain. She continues to walk without symptoms of calf pain. She denies chest pain or dyspnea.  She underwent arterial duplex studies demonstrating patency of her stent sites with ABIs of 0.9 bilaterally. She remains on aspirin, Plavix, and Pletal.  Outpatient Encounter Prescriptions as of 03/01/2012  Medication Sig Dispense Refill  . amLODipine (NORVASC) 5 MG tablet Take 5 mg by mouth 2 (two) times daily.       Marland Kitchen aspirin EC 81 MG tablet Take 1 tablet (81 mg total) by mouth daily.      . benzonatate (TESSALON) 200 MG capsule Take 200 mg by mouth 2 (two) times daily as needed. For cough      . carvedilol (COREG) 12.5 MG tablet Take 25 mg by mouth 2 (two) times daily.        . chlorthalidone (HYGROTON) 25 MG tablet Take 1 tablet (25 mg total) by mouth daily.  30 tablet  6  . cilostazol (PLETAL) 100 MG tablet Take 100 mg by mouth daily.       . cimetidine (TAGAMET) 400 MG tablet Take 400 mg by mouth 4 (four) times daily as needed. For heartburn      . citalopram (CELEXA) 20 MG tablet Take 20 mg by mouth daily.        . clonazePAM (KLONOPIN) 0.5 MG tablet Take 0.5 mg by mouth 2 (two) times daily as needed. 0.5 tab in the am, 1 tab in the pm      . clopidogrel (PLAVIX) 75 MG tablet Take 1 tablet (75 mg total) by mouth daily.  30 tablet  3  . estropipate (OGEN 1.25) 1.5 MG tablet Take 1.5 mg by mouth daily.       . fluconazole (DIFLUCAN) 150 MG tablet Take 150 mg by mouth daily as needed. For yeast infections      . glimepiride (AMARYL) 1 MG tablet Take 0.5-1 mg by mouth daily before breakfast. Depends on sugar      . L-Methylfolate-B6-B12 (METANX) 3-35-2 MG  TABS Take 1 tablet by mouth 2 (two) times daily.        . metFORMIN (GLUCOPHAGE-XR) 500 MG 24 hr tablet Take 500 mg by mouth Daily.       Marland Kitchen spironolactone (ALDACTONE) 25 MG tablet Take 1 tablet (25 mg total) by mouth daily.  30 tablet  6  . terbinafine (LAMISIL) 250 MG tablet Take 250 mg by mouth daily.      . traZODone (DESYREL) 50 MG tablet Take 50 mg by mouth at bedtime.       Marland Kitchen DISCONTD: losartan (COZAAR) 50 MG tablet Take 50 mg by mouth 2 (two) times daily.          Allergies  Allergen Reactions  . Ciprofloxacin   . Codeine   . Darvocet (Propoxyphene-Acetaminophen)   . Darvon   . Hydralazine   . Hydrocodone Nausea And Vomiting  . Morphine And Related   . Pentazocine Lactate   . Potassium-Containing Compounds   . Procaine Hcl   . Sulfa Drugs Cross Reactors     Past Medical History  Diagnosis Date  . Balance problems   . Syncope and collapse   . Left bundle branch block   .  CKD (chronic kidney disease)     likely due to DM2 and HTN  . Lower extremity edema     CHRONIC  . Depression   . Anxiety   . Obesity   . 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 Hypertension     This has been difficult to control. She has had renal artery doppler evaluation on two different occasions, both times with no evidence for renal artery stenosis. She has had urinary catecholeamine collection that was unremarkable. Edema with 10 mg amlodipine.   . Peripheral vascular disease     Lower extremity evaluation (12/12): ABI 0.79 right, 0.64 left; > 50% mid left SFA stenosis;  s/p L SFA stent and stent to R CIA 11/2011; ABI's post PTA - R 0.90, L 0.96 (TBI on R 0.51, L 0.61)  . GERD (gastroesophageal reflux disease)   . Diabetes mellitus     type  2  . Lung nodule     VATS and biopsy in 2006 that was negative for malignancy  . S/P cardiac catheterization     Left heart cath in 6/06 with normal coronaries, EF 60%.  . Back pain     sees a neurosurgeon  . HLD  (hyperlipidemia)     myalgias with statins  . Chronic diastolic heart failure     Echo (8/12) with EF 60-65%, moderate LV hypertrophy, mild LVH    ROS: Negative except as per HPI  BP 118/66  Pulse 60  Ht 5\' 4"  (1.626 m)  Wt 75.751 kg (167 lb)  BMI 28.67 kg/m2  PHYSICAL EXAM: Pt is alert and oriented, NAD HEENT: normal Neck: JVP - normal, carotids 2+= without bruits Lungs: CTA bilaterally CV: RRR without murmur or gallop Abd: soft, NT, Positive BS, no hepatomegaly Ext: no C/C/E, distal pulses intact and equal Skin: warm/dry no rash   ASSESSMENT AND PLAN: Lower extremities peripheral arterial disease with intermittent claudication. The patient has minimal symptoms at present. She will continue on her current medical program. Will follow with surveillance duplex studies per protocol.  Tonny Bollman 03/08/2012

## 2012-03-13 ENCOUNTER — Ambulatory Visit: Payer: Medicare Other | Admitting: Cardiology

## 2012-03-20 ENCOUNTER — Encounter: Payer: Medicare Other | Admitting: Obstetrics and Gynecology

## 2012-03-26 DIAGNOSIS — E119 Type 2 diabetes mellitus without complications: Secondary | ICD-10-CM | POA: Diagnosis not present

## 2012-03-26 DIAGNOSIS — E1149 Type 2 diabetes mellitus with other diabetic neurological complication: Secondary | ICD-10-CM | POA: Diagnosis not present

## 2012-03-26 DIAGNOSIS — F411 Generalized anxiety disorder: Secondary | ICD-10-CM | POA: Diagnosis not present

## 2012-03-26 DIAGNOSIS — E7800 Pure hypercholesterolemia, unspecified: Secondary | ICD-10-CM | POA: Diagnosis not present

## 2012-03-26 DIAGNOSIS — Z79899 Other long term (current) drug therapy: Secondary | ICD-10-CM | POA: Diagnosis not present

## 2012-03-29 ENCOUNTER — Encounter: Payer: Self-pay | Admitting: Obstetrics and Gynecology

## 2012-03-29 ENCOUNTER — Ambulatory Visit (INDEPENDENT_AMBULATORY_CARE_PROVIDER_SITE_OTHER): Payer: Medicare Other | Admitting: Obstetrics and Gynecology

## 2012-03-29 VITALS — BP 124/74 | Ht 64.0 in | Wt 164.0 lb

## 2012-03-29 DIAGNOSIS — Z780 Asymptomatic menopausal state: Secondary | ICD-10-CM

## 2012-03-29 DIAGNOSIS — N952 Postmenopausal atrophic vaginitis: Secondary | ICD-10-CM

## 2012-03-29 DIAGNOSIS — R351 Nocturia: Secondary | ICD-10-CM | POA: Diagnosis not present

## 2012-03-29 MED ORDER — ESTRADIOL 0.05 MG/24HR TD PTTW: 1.0000 | MEDICATED_PATCH | TRANSDERMAL | Status: DC

## 2012-03-29 NOTE — Progress Notes (Signed)
The patient came back to see me today for further followup. She is remained on Ogen 1.25 mg and takes one pill in the morning and half a pill at night. In spite of this she does have some vasomotor symptoms but is basically doing well but does not do well if she tries to stop it.she is having no vaginal dryness as a result of the medication and intercourse is comfortable.she is having no vaginal bleeding. She is having no pelvic pain. She continues with nocturia x2 but does not feel the need for medication. We discussed an estrogen patch last year for safety from thromboembolic disease but she elected not to do so. She is up-to-date on mammograms. Her last bone density was normal in 2010. She is a within normal Pap smears. Her last Pap smear was May 2011. She has had no fractures. Since had last seen her she had to have surgery for lower extremity arterial disease due to PAD and she has done very well since the surgery. She is also been watched for calcifications of her right breast which appeared to be nonsuspicious.  ROS: 12 system review done. Pertinent positives above. Other positives include diabetes mellitus, hypertension, arteriosclerotic heart disease, and hyperlipidemia.  HEENT: Within normal limits. Kennon Portela present. Neck: No masses. Supraclavicular lymph nodes: Not enlarged. Breasts: Examined in both sitting and lying position. Symmetrical without skin changes or masses. Abdomen: Soft no masses guarding or rebound. No hernias. Pelvic: External within normal limits. BUS within normal limits. Vaginal examination shows good estrogen effect, no cystocele enterocele or rectocele. Cervix and uterus absent. Adnexa within normal limits. Rectovaginal confirmatory. Extremities within normal limits.  Assessment: #1. Vasomotor symptoms #2. Atrophic vaginitis #3. Nonsuspicious breast calcifications #4. Nocturia  Plan: Switched her to a Vivelle dot patch. She will call if she needs a different  strength. Schedule 6 month followup mammogram.The new Pap smear guidelines were discussed with the patient. No Pap done.

## 2012-04-06 ENCOUNTER — Encounter: Payer: Self-pay | Admitting: Cardiology

## 2012-04-06 ENCOUNTER — Ambulatory Visit (INDEPENDENT_AMBULATORY_CARE_PROVIDER_SITE_OTHER): Payer: Medicare Other | Admitting: Cardiology

## 2012-04-06 VITALS — BP 98/40 | HR 60 | Ht 64.5 in | Wt 165.0 lb

## 2012-04-06 DIAGNOSIS — E785 Hyperlipidemia, unspecified: Secondary | ICD-10-CM | POA: Diagnosis not present

## 2012-04-06 DIAGNOSIS — I70219 Atherosclerosis of native arteries of extremities with intermittent claudication, unspecified extremity: Secondary | ICD-10-CM

## 2012-04-06 DIAGNOSIS — I10 Essential (primary) hypertension: Secondary | ICD-10-CM | POA: Diagnosis not present

## 2012-04-06 MED ORDER — CHLORTHALIDONE 25 MG PO TABS: ORAL_TABLET | ORAL | Status: DC

## 2012-04-06 NOTE — Patient Instructions (Addendum)
Your physician wants you to follow-up in: 6 MONTHS WITH DR [facility] You will receive a reminder letter in the mail two months in advance. If you don't receive a letter, please call our office to schedule the follow-up appointment.   DECREASE CHLORTHALIDONE TO 25 MG TAKE ONE HALF TABLET ONCE DAILY  Your physician recommends that you return for lab work in: 2 WEEKS

## 2012-04-08 NOTE — Progress Notes (Signed)
Patient ID: Doris Carr, female   DOB: September 18, 1936, 76 y.o.   MRN: [id number] PCP: Dr. Tenny Craw  76 yo with history of CKD, diabetes, chronic LBBB, PAD, and difficult to control HTN presents for cardiology followup.  Blood pressure has been better since adding spironolactone and is actually a little low today. It has been running low at times.  She had a lower extremity angiogram in 2/13 with PTA and stenting of the left SFA and right CIA.  Renal arteries had no significant stenosis.  She has done quite well since the procedure.  She does not have claudication now.  She is back at the gym and walking for 5 minutes on a treadmill.  She also rides on the exercise bike.  No exertional dyspnea, no chest pain.  Weight is down 7 lbs since last appointment.  She has been tried on multiple cholesterol meds and has not tolerated any.   ECG: NSR, LBBB (chronic)  Labs (7/12): HgbA1c 5.9, K 3.8, creatinine 1.49, BUN 47 Labs (8/12): BNP 108, K 3.7, creatinine 1.3 => 1.4 Labs (12/12): K 4.5, creatinine 1.4 Labs (2/13): K 4.7, creatinine 0.96 Labs (6/13): K 4.7, creatinine 1.5, LDL 169  PMH: 1.  HTN: This has been difficult to control.  She has had renal artery doppler evaluation on two different occasions, both times with no evidence for renal artery stenosis.  She has had urinary catecholeamine collection that was unremarkable.  Edema with 10 mg amlodipine.  Renal artery angiogram in 2/13 showed no renal artery stenosis.  2. Diabetes mellitus type II 3. CKD: Likely due to diabetes and HTN. 4. Chronic LBBB 5. Lung nodule with VATS and biopsy in 2006 that was negative for malignancy.  6. Depression 7. Anxiety 8. Obesity 9. Left heart cath in 6/06 with normal coronaries, EF 60%.  10.  Back pain: Sees a neurosurgeon.  11.  Hyperlipidemia: Myalgias with statins.  12.  Diastolic CHF: Echo (8/12) with EF 60-65%, moderate LV hypertrophy, mild LVH. 13.  PAD: Normal ABIs at [facility] in 6/11.  Lower extremity evaluation  (12/12): ABI 0.79 right, 0.64 left; > 50% mid left SFA stenosis.  Peripheral angiogram in 2/13 with PTA and stenting of the left SFA and right CIA.   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  Nonsmoker.    FH: Mother with pacemaker and Alzheimer's-type dementia.    Current Outpatient Prescriptions  Medication Sig Dispense Refill  . amLODipine (NORVASC) 5 MG tablet Take 5 mg by mouth 2 (two) times daily.       Marland Kitchen aspirin EC 81 MG tablet Take 1 tablet (81 mg total) by mouth daily.      . benzonatate (TESSALON) 200 MG capsule Take 200 mg by mouth 2 (two) times daily as needed. For cough      . carvedilol (COREG) 12.5 MG tablet Take 25 mg by mouth 2 (two) times daily.        . chlorthalidone (HYGROTON) 25 MG tablet TAKE ONE HALF TABLET ONCE DAILY  30 tablet  6  . cilostazol (PLETAL) 100 MG tablet Take 100 mg by mouth daily.       . cimetidine (TAGAMET) 400 MG tablet Take 400 mg by mouth 4 (four) times daily as needed. For heartburn      . citalopram (CELEXA) 20 MG tablet Take 20 mg by mouth daily.        . clonazePAM (KLONOPIN) 0.5 MG tablet Take 0.5 mg by mouth 2 (two) times  daily as needed. 0.5 tab in the am, 1 tab in the pm      . clopidogrel (PLAVIX) 75 MG tablet TAKE ONE TABLET BY MOUTH DAILY  30 tablet  6  . estradiol (VIVELLE-DOT) 0.05 MG/24HR Place 1 patch (0.05 mg total) onto the skin 2 (two) times a week.  8 patch  12  . Estropipate (OGEN 1.25 PO) Take by mouth.      . fluconazole (DIFLUCAN) 150 MG tablet Take 150 mg by mouth daily as needed. For yeast infections      . glimepiride (AMARYL) 1 MG tablet Take 0.5-1 mg by mouth daily before breakfast. Depends on sugar      . L-Methylfolate-B6-B12 (METANX) 3-35-2 MG TABS Take 1 tablet by mouth 2 (two) times daily.        Marland Kitchen losartan (COZAAR) 50 MG tablet Take 1 tablet (50 mg total) by mouth 2 (two) times daily.  180 tablet  3  . metFORMIN (GLUCOPHAGE-XR) 500 MG 24 hr tablet Take 500 mg by mouth Daily.       Marland Kitchen spironolactone  (ALDACTONE) 25 MG tablet Take 1 tablet (25 mg total) by mouth daily.  30 tablet  6  . terbinafine (LAMISIL) 250 MG tablet Take 250 mg by mouth daily.      . traZODone (DESYREL) 50 MG tablet Take 50 mg by mouth at bedtime.       Marland Kitchen DISCONTD: carvedilol (COREG) 12.5 MG tablet Take 1 tablet (12.5 mg total) by mouth 2 (two) times daily.  60 tablet  6    BP 98/40  Pulse 60  Ht 5' 4.5" (1.638 m)  Wt 74.844 kg (165 lb)  BMI 27.88 kg/m2 General: NAD, overweight Neck: No JVD, no thyromegaly or thyroid nodule.  Lungs: Clear to auscultation bilaterally with normal respiratory effort. CV: Nondisplaced PMI.  Heart regular S1/S2, soft S4, no murmur.  No edema.  No carotid bruit.  Trace PT pulses, no foot ulcerations. Bilateral lower leg venous varicosities.  Abdomen: Soft, nontender, no hepatosplenomegaly, no distention.   Neurologic: Alert and oriented x 3.  Psych: Normal affect. Extremities: No clubbing or cyanosis.

## 2012-04-08 NOTE — Assessment & Plan Note (Signed)
She has not been able to tolerate any statin due to muscle weakness.  She did not tolerate Zetia.  She was also not able to tolerate Niaspan.  Needs to watch diet carefully as LDL is high and she has known vascular disease.

## 2012-04-08 NOTE — Assessment & Plan Note (Signed)
Claudication much improved since stenting of the left SFA and right CIA.  She seems essentially asymptomatic from this standpoint.  Continue Plavix and ASA.  She cannot take statins.

## 2012-04-08 NOTE — Assessment & Plan Note (Signed)
BP is actually running on the low side.  98/40 today.  I will have her decrease chlorthalidone to 12.5 mg daily.  BMET in 2 wks.

## 2012-04-10 ENCOUNTER — Other Ambulatory Visit: Payer: Self-pay | Admitting: Cardiology

## 2012-04-20 ENCOUNTER — Ambulatory Visit (INDEPENDENT_AMBULATORY_CARE_PROVIDER_SITE_OTHER): Payer: Medicare Other | Admitting: *Deleted

## 2012-04-20 DIAGNOSIS — I10 Essential (primary) hypertension: Secondary | ICD-10-CM | POA: Diagnosis not present

## 2012-04-20 LAB — BASIC METABOLIC PANEL
Chloride: 104 mEq/L (ref 96–112)
GFR: 29.02 mL/min — ABNORMAL LOW (ref >60.00)
Potassium: 5 mEq/L (ref 3.5–5.1)
Sodium: 136 mEq/L (ref 135–145)

## 2012-04-24 ENCOUNTER — Other Ambulatory Visit: Payer: Self-pay | Admitting: *Deleted

## 2012-04-24 DIAGNOSIS — I10 Essential (primary) hypertension: Secondary | ICD-10-CM

## 2012-04-30 ENCOUNTER — Other Ambulatory Visit (INDEPENDENT_AMBULATORY_CARE_PROVIDER_SITE_OTHER): Payer: Medicare Other

## 2012-04-30 DIAGNOSIS — I10 Essential (primary) hypertension: Secondary | ICD-10-CM | POA: Diagnosis not present

## 2012-04-30 LAB — BASIC METABOLIC PANEL
CO2: 25 mEq/L (ref 19–32)
Chloride: 103 mEq/L (ref 96–112)
Glucose, Bld: 138 mg/dL — ABNORMAL HIGH (ref 70–99)
Potassium: 4.9 mEq/L (ref 3.5–5.1)
Sodium: 133 mEq/L — ABNORMAL LOW (ref 135–145)

## 2012-05-01 ENCOUNTER — Other Ambulatory Visit: Payer: Self-pay | Admitting: *Deleted

## 2012-05-01 ENCOUNTER — Telehealth: Payer: Self-pay | Admitting: *Deleted

## 2012-05-01 DIAGNOSIS — N289 Disorder of kidney and ureter, unspecified: Secondary | ICD-10-CM

## 2012-05-01 DIAGNOSIS — I10 Essential (primary) hypertension: Secondary | ICD-10-CM

## 2012-05-01 MED ORDER — ESTROPIPATE 1.5 MG PO TABS: ORAL_TABLET | ORAL | Status: DC

## 2012-05-01 NOTE — Addendum Note (Signed)
Addended by: Valeda Malm L on: 05/01/2012 11:31 AM   Modules accepted: Orders

## 2012-05-01 NOTE — Telephone Encounter (Signed)
Okay to put her back on ogen. Same dose as she was on at last office visit.

## 2012-05-01 NOTE — Telephone Encounter (Signed)
Patient informed rx sent in. 

## 2012-05-01 NOTE — Telephone Encounter (Signed)
Patient says she is ready to go back on her Generic Ogen.  She has experienced dizziness and sick on stomach with the patch.  Wants to change back now. Ok to send in?

## 2012-05-28 DIAGNOSIS — F411 Generalized anxiety disorder: Secondary | ICD-10-CM | POA: Diagnosis not present

## 2012-05-28 DIAGNOSIS — E119 Type 2 diabetes mellitus without complications: Secondary | ICD-10-CM | POA: Diagnosis not present

## 2012-05-29 ENCOUNTER — Other Ambulatory Visit (INDEPENDENT_AMBULATORY_CARE_PROVIDER_SITE_OTHER): Payer: Medicare Other

## 2012-05-29 DIAGNOSIS — I10 Essential (primary) hypertension: Secondary | ICD-10-CM | POA: Diagnosis not present

## 2012-05-29 DIAGNOSIS — N289 Disorder of kidney and ureter, unspecified: Secondary | ICD-10-CM

## 2012-05-29 LAB — BASIC METABOLIC PANEL
BUN: 42 mg/dL — ABNORMAL HIGH (ref 6–23)
CO2: 25 mEq/L (ref 19–32)
Chloride: 103 mEq/L (ref 96–112)
Creatinine, Ser: 1.5 mg/dL — ABNORMAL HIGH (ref 0.4–1.2)
Potassium: 4.8 mEq/L (ref 3.5–5.1)
Sodium: 136 mEq/L (ref 135–145)

## 2012-07-10 DIAGNOSIS — E11329 Type 2 diabetes mellitus with mild nonproliferative diabetic retinopathy without macular edema: Secondary | ICD-10-CM | POA: Diagnosis not present

## 2012-07-10 DIAGNOSIS — H26499 Other secondary cataract, unspecified eye: Secondary | ICD-10-CM | POA: Diagnosis not present

## 2012-07-12 DIAGNOSIS — Z23 Encounter for immunization: Secondary | ICD-10-CM | POA: Diagnosis not present

## 2012-07-17 DIAGNOSIS — M545 Low back pain: Secondary | ICD-10-CM | POA: Diagnosis not present

## 2012-07-24 DIAGNOSIS — M545 Low back pain: Secondary | ICD-10-CM | POA: Diagnosis not present

## 2012-07-27 DIAGNOSIS — M545 Low back pain: Secondary | ICD-10-CM | POA: Diagnosis not present

## 2012-07-31 DIAGNOSIS — M545 Low back pain: Secondary | ICD-10-CM | POA: Diagnosis not present

## 2012-08-16 DIAGNOSIS — M545 Low back pain: Secondary | ICD-10-CM | POA: Diagnosis not present

## 2012-08-21 DIAGNOSIS — M545 Low back pain: Secondary | ICD-10-CM | POA: Diagnosis not present

## 2012-08-23 DIAGNOSIS — M545 Low back pain: Secondary | ICD-10-CM | POA: Diagnosis not present

## 2012-08-27 DIAGNOSIS — E119 Type 2 diabetes mellitus without complications: Secondary | ICD-10-CM | POA: Diagnosis not present

## 2012-08-27 DIAGNOSIS — R944 Abnormal results of kidney function studies: Secondary | ICD-10-CM | POA: Diagnosis not present

## 2012-09-03 ENCOUNTER — Ambulatory Visit: Payer: Medicare Other | Admitting: Cardiovascular Disease

## 2012-09-03 DIAGNOSIS — J019 Acute sinusitis, unspecified: Secondary | ICD-10-CM | POA: Diagnosis not present

## 2012-09-06 ENCOUNTER — Other Ambulatory Visit: Payer: Self-pay | Admitting: *Deleted

## 2012-09-06 MED ORDER — CLOPIDOGREL BISULFATE 75 MG PO TABS: 75.0000 mg | ORAL_TABLET | Freq: Every day | ORAL | Status: DC

## 2012-09-11 DIAGNOSIS — Z09 Encounter for follow-up examination after completed treatment for conditions other than malignant neoplasm: Secondary | ICD-10-CM | POA: Diagnosis not present

## 2012-09-11 DIAGNOSIS — R928 Other abnormal and inconclusive findings on diagnostic imaging of breast: Secondary | ICD-10-CM | POA: Diagnosis not present

## 2012-09-12 ENCOUNTER — Other Ambulatory Visit: Payer: Self-pay | Admitting: *Deleted

## 2012-09-12 DIAGNOSIS — R928 Other abnormal and inconclusive findings on diagnostic imaging of breast: Secondary | ICD-10-CM

## 2012-09-13 ENCOUNTER — Encounter: Payer: Self-pay | Admitting: Obstetrics and Gynecology

## 2012-09-17 DIAGNOSIS — R05 Cough: Secondary | ICD-10-CM | POA: Diagnosis not present

## 2012-09-17 DIAGNOSIS — R059 Cough, unspecified: Secondary | ICD-10-CM | POA: Diagnosis not present

## 2012-10-24 ENCOUNTER — Encounter: Payer: Self-pay | Admitting: Cardiovascular Disease

## 2012-10-24 ENCOUNTER — Ambulatory Visit (INDEPENDENT_AMBULATORY_CARE_PROVIDER_SITE_OTHER): Payer: Medicare Other | Admitting: Cardiovascular Disease

## 2012-10-24 VITALS — BP 116/64 | HR 60 | Ht 64.0 in | Wt 153.1 lb

## 2012-10-24 DIAGNOSIS — I70219 Atherosclerosis of native arteries of extremities with intermittent claudication, unspecified extremity: Secondary | ICD-10-CM

## 2012-10-24 NOTE — Patient Instructions (Addendum)
Your physician wants you to follow-up in:  12 months. You will receive a reminder letter in the mail two months in advance. If you don't receive a letter, please call our office to schedule the follow-up appointment.  Your physician has requested that you have a lower or upper extremity arterial duplex. This test is an ultrasound of the arteries in the legs or arms. It looks at arterial blood flow in the legs and arms. Allow one hour for Lower and Upper Arterial scans. There are no restrictions or special instructions. To be done in May 2014  Your physician has requested that you have an  Aorto- iliac duplex. During this test, an ultrasound is used to evaluate the aorta. Allow 30 minutes for this exam. Do not eat after midnight the day before and avoid carbonated beverages.  To be done in May 2014

## 2012-10-24 NOTE — Progress Notes (Signed)
HPI:  77 year old woman presenting for followup evaluation. The patient has lower extremity peripheral arterial disease. She has undergone right common iliac and left SFA stenting. She had ABIs and duplex studies done in May 2013 demonstrating patency of her stent sites with ABIs of 0.89 bilaterally.  Overall she feels well. She's lost weight through dietary changes. She denies exertional symptoms. She is still unable to walk very far because of leg weakness and diffuse aching. However, she does not have claudication symptoms any longer. She denies chest pain or dyspnea.  Outpatient Encounter Prescriptions as of 10/24/2012  Medication Sig Dispense Refill  . amLODipine (NORVASC) 5 MG tablet Take 5 mg by mouth 2 (two) times daily.       Marland Kitchen aspirin EC 81 MG tablet Take 1 tablet (81 mg total) by mouth daily.      . benzonatate (TESSALON) 200 MG capsule Take 200 mg by mouth 2 (two) times daily as needed. For cough      . carvedilol (COREG) 12.5 MG tablet Take 25 mg by mouth 2 (two) times daily.        . chlorthalidone (HYGROTON) 25 MG tablet TAKE ONE HALF TABLET ONCE DAILY  30 tablet  6  . cilostazol (PLETAL) 100 MG tablet Take 100 mg by mouth daily.       . cimetidine (TAGAMET) 400 MG tablet Take 400 mg by mouth 4 (four) times daily as needed. For heartburn      . citalopram (CELEXA) 20 MG tablet Take 20 mg by mouth daily.        . clonazePAM (KLONOPIN) 0.5 MG tablet Take 0.5 mg by mouth 2 (two) times daily as needed. 0.5 tab in the am, 1 tab in the pm      . clopidogrel (PLAVIX) 75 MG tablet Take 1 tablet (75 mg total) by mouth daily.  30 tablet  6  . Estropipate (OGEN 1.25 PO) Take by mouth.      . estropipate (OGEN) 1.5 MG tablet Take one by mouth in the morning and 1/2 at night  45 tablet  12  . fluconazole (DIFLUCAN) 150 MG tablet Take 150 mg by mouth daily as needed. For yeast infections      . glimepiride (AMARYL) 1 MG tablet Take 0.5-1 mg by mouth daily before breakfast. Depends on sugar       . L-Methylfolate-B6-B12 (METANX) 3-35-2 MG TABS Take 1 tablet by mouth 2 (two) times daily.        Marland Kitchen losartan (COZAAR) 50 MG tablet Take 1 tablet (50 mg total) by mouth 2 (two) times daily.  180 tablet  3  . metFORMIN (GLUCOPHAGE-XR) 500 MG 24 hr tablet Take 500 mg by mouth Daily.       Marland Kitchen spironolactone (ALDACTONE) 25 MG tablet TAKE 1 TABLET BY MOUTH DAILY  30 tablet  6  . terbinafine (LAMISIL) 250 MG tablet Take 250 mg by mouth daily.      . traZODone (DESYREL) 50 MG tablet Take 50 mg by mouth at bedtime.         Allergies  Allergen Reactions  . Ciprofloxacin   . Codeine   . Darvocet (Propoxyphene-Acetaminophen)   . Darvon   . Hydralazine   . Hydrocodone Nausea And Vomiting  . Morphine And Related   . Pentazocine Lactate   . Potassium-Containing Compounds   . Procaine Hcl   . Sulfa Drugs Cross Reactors     Past Medical History  Diagnosis Date  . Balance problems   .  Syncope and collapse   . Left bundle branch block   . CKD (chronic kidney disease)     likely due to DM2 and HTN  . Lower extremity edema     CHRONIC  . Depression   . Anxiety   . Obesity   . 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 Hypertension     This has been difficult to control. She has had renal artery doppler evaluation on two different occasions, both times with no evidence for renal artery stenosis. She has had urinary catecholeamine collection that was unremarkable. Edema with 10 mg amlodipine.   . Peripheral vascular disease     Lower extremity evaluation (12/12): ABI 0.79 right, 0.64 left; > 50% mid left SFA stenosis;  s/p L SFA stent and stent to R CIA 11/2011; ABI's post PTA - R 0.90, L 0.96 (TBI on R 0.51, L 0.61)  . GERD (gastroesophageal reflux disease)   . Diabetes mellitus     type  2  . Lung nodule     VATS and biopsy in 2006 that was negative for malignancy  . S/P cardiac catheterization     Left heart cath in 6/06 with normal coronaries, EF 60%.  . Back  pain     sees a neurosurgeon  . HLD (hyperlipidemia)     myalgias with statins  . Chronic diastolic heart failure     Echo (8/12) with EF 60-65%, moderate LV hypertrophy, mild LVH    ROS: Negative except as per HPI  BP 116/64  Pulse 60  Ht 5\' 4"  (1.626 m)  Wt 69.455 kg (153 lb 1.9 oz)  BMI 26.28 kg/m2  PHYSICAL EXAM: Pt is alert and oriented, NAD HEENT: normal Neck: JVP - normal, carotids 2+= without bruits Lungs: CTA bilaterally CV: RRR with paradoxically split S2 Abd: soft, NT, Positive BS, no hepatomegaly Ext: no C/C/E, distal pulses intact and equal Skin: warm/dry no rash  EKG:  Sinus rhythm 62 beats per minute, left bundle branch block, no significant change from previous tracing.  ASSESSMENT AND PLAN: 1. Lower extremity peripheral arterial disease with intermittent claudication. Doppler studies last year with ABIs demonstrated continued patency of her stent sites and essentially normal ABIs bilaterally. She should have followup surveillance studies done this summer. I will see her back in one year. She remains on a combination of aspirin and Plavix for antiplatelet therapy. The patient is statin intolerant.  2. Hypertension. Blood pressure is very well-controlled on her current medical program.  For followup I will see the patient back in one year. Cardiac followup is with Dr. Shirlee Latch.  Tonny Bollman 10/24/2012 11:54 AM

## 2012-11-19 ENCOUNTER — Other Ambulatory Visit: Payer: Self-pay | Admitting: Cardiology

## 2012-11-27 DIAGNOSIS — R229 Localized swelling, mass and lump, unspecified: Secondary | ICD-10-CM | POA: Diagnosis not present

## 2012-11-27 DIAGNOSIS — Z79899 Other long term (current) drug therapy: Secondary | ICD-10-CM | POA: Diagnosis not present

## 2012-11-27 DIAGNOSIS — E119 Type 2 diabetes mellitus without complications: Secondary | ICD-10-CM | POA: Diagnosis not present

## 2012-11-27 DIAGNOSIS — E7800 Pure hypercholesterolemia, unspecified: Secondary | ICD-10-CM | POA: Diagnosis not present

## 2013-01-16 ENCOUNTER — Other Ambulatory Visit: Payer: Self-pay | Admitting: *Deleted

## 2013-01-16 MED ORDER — SPIRONOLACTONE 25 MG PO TABS: ORAL_TABLET | ORAL | Status: DC

## 2013-02-05 ENCOUNTER — Encounter (INDEPENDENT_AMBULATORY_CARE_PROVIDER_SITE_OTHER): Payer: Medicare Other

## 2013-02-05 DIAGNOSIS — I739 Peripheral vascular disease, unspecified: Secondary | ICD-10-CM

## 2013-02-05 DIAGNOSIS — I700 Atherosclerosis of aorta: Secondary | ICD-10-CM

## 2013-02-05 DIAGNOSIS — I70219 Atherosclerosis of native arteries of extremities with intermittent claudication, unspecified extremity: Secondary | ICD-10-CM

## 2013-02-05 DIAGNOSIS — I10 Essential (primary) hypertension: Secondary | ICD-10-CM

## 2013-02-08 ENCOUNTER — Encounter: Payer: Self-pay | Admitting: Cardiovascular Disease

## 2013-02-08 NOTE — Telephone Encounter (Signed)
This encounter was created in error - please disregard.

## 2013-02-15 ENCOUNTER — Other Ambulatory Visit: Payer: Self-pay | Admitting: *Deleted

## 2013-02-15 MED ORDER — LOSARTAN POTASSIUM 50 MG PO TABS: 50.0000 mg | ORAL_TABLET | Freq: Two times a day (BID) | ORAL | Status: DC

## 2013-02-27 DIAGNOSIS — E1149 Type 2 diabetes mellitus with other diabetic neurological complication: Secondary | ICD-10-CM | POA: Diagnosis not present

## 2013-02-27 DIAGNOSIS — E119 Type 2 diabetes mellitus without complications: Secondary | ICD-10-CM | POA: Diagnosis not present

## 2013-02-27 DIAGNOSIS — R944 Abnormal results of kidney function studies: Secondary | ICD-10-CM | POA: Diagnosis not present

## 2013-02-27 DIAGNOSIS — I10 Essential (primary) hypertension: Secondary | ICD-10-CM | POA: Diagnosis not present

## 2013-02-27 DIAGNOSIS — F411 Generalized anxiety disorder: Secondary | ICD-10-CM | POA: Diagnosis not present

## 2013-02-28 DIAGNOSIS — R928 Other abnormal and inconclusive findings on diagnostic imaging of breast: Secondary | ICD-10-CM | POA: Diagnosis not present

## 2013-02-28 DIAGNOSIS — Z09 Encounter for follow-up examination after completed treatment for conditions other than malignant neoplasm: Secondary | ICD-10-CM | POA: Diagnosis not present

## 2013-03-03 DIAGNOSIS — R319 Hematuria, unspecified: Secondary | ICD-10-CM | POA: Diagnosis not present

## 2013-03-03 DIAGNOSIS — B373 Candidiasis of vulva and vagina: Secondary | ICD-10-CM | POA: Diagnosis not present

## 2013-03-03 DIAGNOSIS — R350 Frequency of micturition: Secondary | ICD-10-CM | POA: Diagnosis not present

## 2013-03-03 DIAGNOSIS — B3731 Acute candidiasis of vulva and vagina: Secondary | ICD-10-CM | POA: Diagnosis not present

## 2013-03-04 ENCOUNTER — Encounter: Payer: Self-pay | Admitting: Gynecology

## 2013-03-13 ENCOUNTER — Other Ambulatory Visit: Payer: Self-pay

## 2013-03-13 MED ORDER — CLOPIDOGREL BISULFATE 75 MG PO TABS: 75.0000 mg | ORAL_TABLET | Freq: Every day | ORAL | Status: DC

## 2013-03-17 ENCOUNTER — Other Ambulatory Visit: Payer: Self-pay | Admitting: Cardiology

## 2013-03-18 ENCOUNTER — Other Ambulatory Visit: Payer: Self-pay | Admitting: *Deleted

## 2013-03-18 MED ORDER — CHLORTHALIDONE 25 MG PO TABS: ORAL_TABLET | ORAL | Status: DC

## 2013-04-02 ENCOUNTER — Encounter: Payer: Self-pay | Admitting: Gynecology

## 2013-04-02 ENCOUNTER — Ambulatory Visit (INDEPENDENT_AMBULATORY_CARE_PROVIDER_SITE_OTHER): Payer: Medicare Other | Admitting: Gynecology

## 2013-04-02 VITALS — BP 120/78 | Ht 64.0 in | Wt 146.0 lb

## 2013-04-02 DIAGNOSIS — N816 Rectocele: Secondary | ICD-10-CM

## 2013-04-02 DIAGNOSIS — N952 Postmenopausal atrophic vaginitis: Secondary | ICD-10-CM | POA: Diagnosis not present

## 2013-04-02 DIAGNOSIS — Z79890 Hormone replacement therapy (postmenopausal): Secondary | ICD-10-CM | POA: Diagnosis not present

## 2013-04-02 MED ORDER — FLUCONAZOLE 150 MG PO TABS: 150.0000 mg | ORAL_TABLET | Freq: Once | ORAL | Status: DC

## 2013-04-02 MED ORDER — ESTROPIPATE 1.5 MG PO TABS: ORAL_TABLET | ORAL | Status: DC

## 2013-04-02 NOTE — Patient Instructions (Signed)
Follow up in one year for annual exam 

## 2013-04-02 NOTE — Progress Notes (Signed)
ANGLES TREVIZO 1936-07-28 [id number]        77 y.o.  [id number] for followup exam.  Former patient of Dr. Eda Paschal.  Past medical history,surgical history, medications, allergies, family history and social history were all reviewed and documented in the EPIC chart.  ROS:  Performed and pertinent positives and negatives are included in the history, assessment and plan .  Exam: Kim assistant Filed Vitals:   04/02/13 1011  BP: 120/78  Height: 5\' 4"  (1.626 m)  Weight: 146 lb (66.225 kg)   General appearance  Normal Skin grossly normal Head/Neck normal with no cervical or supraclavicular adenopathy thyroid normal Lungs  clear Cardiac RR, without RMG Abdominal  soft, nontender, without masses, organomegaly or hernia Breasts  examined lying and sitting without masses, retractions, discharge or axillary adenopathy. Pelvic  Ext/BUS/vagina  normal with atrophic changes. First to second-degree rectocele. Cuff well supported. No significant cystocele.  Adnexa  Without masses or tenderness    Anus and perineum  normal   Rectovaginal  normal sphincter tone without palpated masses or tenderness. First to second-degree rectocele confirmed.   Assessment/Plan:  77 y.o. [id number] female for followup exam.   1. ERT. Patient on Ogen 1.5 mg daily. Has tried to wean in the past with return upon flushes and night sweats which are unacceptable. I reviewed the whole issue of HRT with her to include the WHI study with increased risk of stroke, heart attack, DVT and breast cancer. The ACOG and NAMS statements for lowest dose for the shortest period of time reviewed. Transdermal versus oral first-pass effect benefit discussed. She had tried patches earlier but did not tolerate these. Issues of using ERT with advancing age also discussed. She does have peripheral vascular disease and possible increased thrombosis risk realistically discussed. After lengthy discussion she wants to continue and I refilled her Ogen 1.5 mg  daily. I did ask her to try to wean this coming year to see how she does. 2. Rectocele. Patient has a first to second-degree rectocele. Does have occasional symptoms when she is constipated. Overall doing well and asymptomatic. Options include observation, pessary and surgery discussed. Patient plans observation at present. Will followup if she becomes more symptomatic. 3. Recurrent yeast infections. Patient does have a history of recurrent yeast infections. She has lost 40 pounds this past year and was being followed for diabetes previously. Is no longer taking medication for this. She did ask for a Diflucan refill to have available in case she does have recurrent infections I provide Diflucan 150 mg #10 no refill. One by mouth when necessary yeast symptoms. 4. Pap smear 2011. History of cone biopsy with subsequent vaginal hysterectomy when she was in her 30s. Reports Pap smears all normal since then. Discussed current screening guidelines are recommended stop screening and she is comfortable with this. 5. Mammography 01/2013. Continue with annual mammography. SBE monthly reviewed. 6. DEXA 01/2009 normal. Plan repeat next year at five-year interval. Increase calcium vitamin D reviewed. 7. Colonoscopy never. Patient refuses colonoscopy. Despite primary physician's recommendation and myself she understands the benefits of early detection and refuses. 8. Health maintenance. No blood work done as it is all done through her primary physician's office. Followup one year, sooner as needed.    Dara Lords MD, 11:02 AM 04/02/2013

## 2013-04-03 LAB — URINALYSIS W MICROSCOPIC + REFLEX CULTURE
Glucose, UA: NEGATIVE mg/dL
Hgb urine dipstick: NEGATIVE
Leukocytes, UA: NEGATIVE
Nitrite: NEGATIVE
Protein, ur: 30 mg/dL — AB

## 2013-04-04 ENCOUNTER — Encounter: Payer: Self-pay | Admitting: Cardiovascular Disease

## 2013-04-04 ENCOUNTER — Ambulatory Visit (INDEPENDENT_AMBULATORY_CARE_PROVIDER_SITE_OTHER): Payer: Medicare Other | Admitting: Cardiovascular Disease

## 2013-04-04 VITALS — BP 108/58 | HR 56 | Ht 64.0 in | Wt 147.1 lb

## 2013-04-04 DIAGNOSIS — I10 Essential (primary) hypertension: Secondary | ICD-10-CM | POA: Diagnosis not present

## 2013-04-04 DIAGNOSIS — I70219 Atherosclerosis of native arteries of extremities with intermittent claudication, unspecified extremity: Secondary | ICD-10-CM

## 2013-04-04 DIAGNOSIS — E785 Hyperlipidemia, unspecified: Secondary | ICD-10-CM | POA: Diagnosis not present

## 2013-04-04 NOTE — Progress Notes (Signed)
HPI:  77 year old woman presenting for followup evaluation. She is followed for lower extremity peripheral arterial disease. She's undergone right common iliac and left SFA stenting in the past. She's also had malignant hypertension, but this is been under very good control in recent years under the care of Dr. Shirlee Latch.  The patient is doing well. She denies exertional leg pain. It's hard for her to stand for prolonged time period. She describes leg weakness and has some low back problems. She denies edema, chest pain, orthopnea, PND, or shortness of breath.  She's been diagnosed with a rectocele and has an upcoming surgery to repair this. She requests cardiac clearance.  Recent noninvasive vascular study showed normal ABIs of 1.0 on the right and 1.2 on the left. Her iliac and SFA stents are patent.  Outpatient Encounter Prescriptions as of 04/04/2013  Medication Sig Dispense Refill  . amLODipine (NORVASC) 5 MG tablet Take 5 mg by mouth 2 (two) times daily.       Marland Kitchen aspirin EC 81 MG tablet Take 1 tablet (81 mg total) by mouth daily.      . benzonatate (TESSALON) 200 MG capsule Take 200 mg by mouth 2 (two) times daily as needed. For cough      . carvedilol (COREG) 12.5 MG tablet Take 25 mg by mouth 2 (two) times daily.        . chlorthalidone (HYGROTON) 25 MG tablet TAKE ONE HALF TABLET ONCE DAILY  30 tablet  1  . cilostazol (PLETAL) 100 MG tablet Take 100 mg by mouth daily.       . cimetidine (TAGAMET) 400 MG tablet Take 400 mg by mouth 4 (four) times daily as needed. For heartburn      . citalopram (CELEXA) 20 MG tablet Take 20 mg by mouth daily.        . clonazePAM (KLONOPIN) 0.5 MG tablet Take 0.5 mg by mouth 2 (two) times daily as needed. 0.5 tab in the am, 1 tab in the pm      . clopidogrel (PLAVIX) 75 MG tablet Take 1 tablet (75 mg total) by mouth daily.  30 tablet  5  . Estropipate (OGEN 1.25 PO) Take by mouth.      . estropipate (OGEN) 1.5 MG tablet Take one by mouth in the morning  30  tablet  12  . fluconazole (DIFLUCAN) 150 MG tablet Take 1 tablet (150 mg total) by mouth once. As needed for yeast  10 tablet  0  . L-Methylfolate-B6-B12 (METANX) 3-35-2 MG TABS Take 1 tablet by mouth 2 (two) times daily.        Marland Kitchen losartan (COZAAR) 50 MG tablet Take 1 tablet (50 mg total) by mouth 2 (two) times daily.  180 tablet  3  . metFORMIN (GLUCOPHAGE-XR) 500 MG 24 hr tablet Take 500 mg by mouth Daily.       Marland Kitchen spironolactone (ALDACTONE) 25 MG tablet TAKE 1 TABLET BY MOUTH DAILY  30 tablet  5  . terbinafine (LAMISIL) 250 MG tablet Take 250 mg by mouth daily.      . traZODone (DESYREL) 50 MG tablet Take 50 mg by mouth at bedtime.       . [DISCONTINUED] nystatin cream (MYCOSTATIN)        No facility-administered encounter medications on file as of 04/04/2013.    Allergies  Allergen Reactions  . Ciprofloxacin   . Codeine   . Darvocet (Propoxyphene-Acetaminophen)   . Darvon   . Hydralazine   . Hydrocodone  Nausea And Vomiting  . Morphine And Related   . Pentazocine Lactate   . Potassium-Containing Compounds   . Procaine Hcl   . Sulfa Drugs Cross Reactors     Past Medical History  Diagnosis Date  . Balance problems   . Syncope and collapse   . Left bundle branch block   . CKD (chronic kidney disease)     likely due to DM2 and HTN  . Lower extremity edema     CHRONIC  . Depression   . Anxiety   . Obesity   . 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 Hypertension     This has been difficult to control. She has had renal artery doppler evaluation on two different occasions, both times with no evidence for renal artery stenosis. She has had urinary catecholeamine collection that was unremarkable. Edema with 10 mg amlodipine.   . Peripheral vascular disease     Lower extremity evaluation (12/12): ABI 0.79 right, 0.64 left; > 50% mid left SFA stenosis;  s/p L SFA stent and stent to R CIA 11/2011; ABI's post PTA - R 0.90, L 0.96 (TBI on R 0.51, L 0.61)  .  GERD (gastroesophageal reflux disease)   . Diabetes mellitus     type  2  . Lung nodule     VATS and biopsy in 2006 that was negative for malignancy  . S/P cardiac catheterization     Left heart cath in 6/06 with normal coronaries, EF 60%.  . Back pain     sees a neurosurgeon  . HLD (hyperlipidemia)     myalgias with statins  . Chronic diastolic heart failure     Echo (8/12) with EF 60-65%, moderate LV hypertrophy, mild LVH    ROS: Negative except as per HPI  BP 108/58  Pulse 56  Ht 5\' 4"  (1.626 m)  Wt 147 lb 1.9 oz (66.733 kg)  BMI 25.24 kg/m2  PHYSICAL EXAM: Pt is alert and oriented, pleasant elderly woman in NAD HEENT: normal Neck: JVP - normal, carotids 2+= without bruits Lungs: CTA bilaterally CV: RRR without murmur or gallop Abd: soft, NT, Positive BS, no hepatomegaly Ext: no C/C/E, distal pulses intact and equal, no femoral bruits Skin: warm/dry no rash  EKG:  Sinus bradycardia 56 beats per minute, left bundle branch block.  2-D echo 05/27/2011: Left ventricle: The cavity size was normal. Wall thickness was increased in a pattern of moderate LVH. Systolic function was normal. The estimated ejection fraction was in the range of 60% to 65%. Doppler parameters are consistent with abnormal left ventricular relaxation (grade 1 diastolic dysfunction).  -------------------------------------------------------------------- Aortic valve: Mildly thickened, mildly calcified leaflets. Doppler: No regurgitation.  -------------------------------------------------------------------- Mitral valve: Calcified annulus. Mildly thickened leaflets . Doppler: Mild regurgitation. Peak gradient: 2mm Hg (D).  -------------------------------------------------------------------- Left atrium: The atrium was mildly dilated.  -------------------------------------------------------------------- Right ventricle: The cavity size was normal. Wall thickness was normal. Systolic function was  normal.  -------------------------------------------------------------------- Pulmonic valve: Structurally normal valve. Cusp separation was normal. Doppler: Transvalvular velocity was within the normal range. No regurgitation.  -------------------------------------------------------------------- Tricuspid valve: Structurally normal valve. Leaflet separation was normal. Doppler: Transvalvular velocity was within the normal range. Mild regurgitation.  -------------------------------------------------------------------- Right atrium: The atrium was normal in size.  -------------------------------------------------------------------- Pericardium: A trivial pericardial effusion was identified.  -------------------------------------------------------------------- Systemic veins: Inferior vena cava: The vessel was normal in size; the respirophasic diameter changes were in the normal range (= 50%); findings  are consistent with normal central venous pressure.  ASSESSMENT AND PLAN: 1. Lower extremity peripheral arterial disease. The patient is stable without claudication symptoms. She remains on dual antiplatelet therapy with aspirin and Plavix. I have recommended that she discontinue cilostazol. I would like to see her back in one year for followup evaluation. We will check ABIs prior to that office visit.  2. Hypertension. Blood pressure is well controlled on her current medical program.  3. Preoperative cardiac evaluation. The patient has a chronic left bundle branch block on EKG. She's had an echocardiogram demonstrating normal left ventricular systolic function. She has no anginal symptoms and her blood pressure is well controlled with no signs of congestive heart failure. I think she can proceed with repair of her rectocele at low risk of cardiovascular complications.  For followup, she will see Dr. Jearld Pies back in about 6 months and I will see her back in one year.  Tonny Bollman 04/04/2013 12:36 PM

## 2013-04-04 NOTE — Patient Instructions (Addendum)
Your physician wants you to follow-up in: 6 MONTHS with Dr Shirlee Latch.  You will receive a reminder letter in the mail two months in advance. If you don't receive a letter, please call our office to schedule the follow-up appointment.  Your physician wants you to follow-up in: 1 YEAR with Dr Excell Seltzer. You will receive a reminder letter in the mail two months in advance. If you don't receive a letter, please call our office to schedule the follow-up appointment.  Your physician has requested that you have an ankle brachial index (ABI) in MAY 2015. During this test an ultrasound and blood pressure cuff are used to evaluate the arteries that supply the arms and legs with blood. Allow thirty minutes for this exam. There are no restrictions or special instructions.  Your physician has recommended you make the following change in your medication: STOP Pletal

## 2013-04-08 ENCOUNTER — Ambulatory Visit (INDEPENDENT_AMBULATORY_CARE_PROVIDER_SITE_OTHER): Payer: Medicare Other | Admitting: Gynecology

## 2013-04-08 ENCOUNTER — Encounter: Payer: Self-pay | Admitting: Gynecology

## 2013-04-08 ENCOUNTER — Telehealth: Payer: Self-pay | Admitting: *Deleted

## 2013-04-08 DIAGNOSIS — R159 Full incontinence of feces: Secondary | ICD-10-CM

## 2013-04-08 DIAGNOSIS — N816 Rectocele: Secondary | ICD-10-CM | POA: Diagnosis not present

## 2013-04-08 NOTE — Telephone Encounter (Signed)
Message copied by Aura Camps on Mon Apr 08, 2013 10:49 AM ------      Message from: Dara Lords      Created: Mon Apr 08, 2013  9:00 AM       Patient needs appointment with colorectal surgeon who specializes in fecal incontinence. ------

## 2013-04-08 NOTE — Patient Instructions (Signed)
Office will contact you to arrange consultation with colorectal surgeon.

## 2013-04-08 NOTE — Progress Notes (Signed)
Patient presents with her husband to discuss possible surgery. At her last visit she did have a first to second-degree rectocele and we had talked about options include observation, pessary and surgery such as posterior colporrhaphy. She is status post TVH A&P repair in the past. She wanted to talk more about surgical options. On questioning her issue is fecal incontinence. She'll have an episode or 2 daily which she will pass small well formed pellets. She does not have significant symptoms historically from the rectocele such as stool trapping or pressure symptoms. She does have well formed stools without significant constipation.  I reviewed with the patient and her husband issues of fecal incontinence in the differential to include sphincter, neurologic and vascular issues. She does have a very strong history of vascular disease and also being followed for diabetes. I discussed with them that she really needs to be evaluated by a physician who routinely deals with fecal incontinence and needs to undergo testing to include possible ultrasound and manometric measurements. The possible risks and Gen. surgery reviewed to include her strong history of medical issues and vascular disease in the anesthetic and postoperative risks as well as the risk of breakdown with worse incontinence up to and including necessitating a diverting colostomy reviewed. I will refer her to a colorectal surgeon for further evaluation. I do not think a simple rectocele repair would be of benefit to her as her complaints are fecal incontinence. I also discussed that if she does have a posterior colporrhaphy a consideration for mesh should be entertained given the failure of her previous posterior colporrhaphy.

## 2013-04-08 NOTE — Telephone Encounter (Signed)
Pt informed with the below note. 

## 2013-04-08 NOTE — Telephone Encounter (Signed)
Appt. On 04/29/13 @ 2:45 pm left message for pt to call.  With dr. Romie Levee

## 2013-04-29 ENCOUNTER — Ambulatory Visit (INDEPENDENT_AMBULATORY_CARE_PROVIDER_SITE_OTHER): Payer: Medicare Other | Admitting: General Surgery

## 2013-04-29 ENCOUNTER — Encounter (INDEPENDENT_AMBULATORY_CARE_PROVIDER_SITE_OTHER): Payer: Self-pay | Admitting: General Surgery

## 2013-04-29 VITALS — BP 122/68 | HR 60 | Temp 98.0°F | Resp 16 | Ht 64.0 in | Wt 149.0 lb

## 2013-04-29 DIAGNOSIS — R159 Full incontinence of feces: Secondary | ICD-10-CM | POA: Diagnosis not present

## 2013-04-29 NOTE — Patient Instructions (Signed)
Bowel Incontinence  What is incontinence? Incontinence is the impaired ability to control gas or stool. Its severity ranges from mild difficulty with gas control to severe loss of control over liquid and formed stools. Incontinence to stool is a common problem, but often it is not discussed due to embarassment. What causes incontinence? There are many causes of incontinence. Injury during childbirth is one of the most common causes. These injuries may cause a tear in the anal muscles. The nerves supplying the anal muscles may also be injured. While some injuries may be recognized immediately following childbirth, many others may go unnoticed and not become a problem until later in life. In these situations, a prior childbirth may not be recognized as the cause of incontinence. Anal operations or traumatic injury to the tissue surrounding the anal region similarly can damage the anal muscles and hinder bowel control. Some individuals experience loss of strength in the anal muscles as they age. As a result, a minor control problem in a younger person may become more significant later in life. Diarrhea may be associated with a feeling of urgency or stool leakage due to the frequent liquid stools passing through the anal opening. If bleeding accompanies your bowel movements or you have lack of bowel control, consult your physician. These symptoms may indicate inflammation within the colon (colitis), a rectal tumor or rectal prolapse - all conditions that require prompt evaluation by a physician.    How is the cause of incontinence determined? An initial discussion of the problem with your physician will help establish the degree of control difficulty and its impact on your lifestyle. Many clues to the origin of incontinence may be found in patient histories. For example, a woman's history of past childbirths is very important. Multiple pregnancies, large weight babies, forceps deliveries, or episiotomies  may contribute to muscle or nerve injury at the time of childbirth. In some cases, medical illnesses and medications play a role in problems with control. A physical exam of the anal region should be performed. It may readily identify an obvious injury to the anal muscles. In addition, an ultrasound probe can be used within the anal area to provide a picture of the muscles and show areas in which the anal muscles have been injured. Frequently, additional studies are required to define the anal area more completely. In a test called anal manometry, a small catheter is placed into the anus to record pressure as patients relax and tighten the anal muscles. This test can demonstrate how weak or strong the muscle really is. A separate test may also be conducted to determine if the nerves that go to the anal muscles are functioning properly. What can be done to correct the problem? Treatment of incontinence may include: .     Dietary changes .     Constipating medications .     Muscle strengthening exercises .     Biofeedback  Injectable bulking agents  Surgical muscle repair  Artificial anal sphincter  Sacral nerve stimulator  After a careful history, physical examination and testing to determine the cause and severity of the problem, treatment can be addressed. Mild problems may be treated very simply with dietary changes and the use of some constipating medications. Diseases which cause inflammation in the rectum, such as colitis, may contribute to anal control problems. Treating these diseases also may eliminate or improve symptoms of incontinence. Sometimes a change in prescribed medications may help. Your physician also may recommend simple home exercises that may   strengthen the anal muscles to help in mild cases. A type of physical therapy called biofeedback can be used to help patients sense when stool is ready to be evacuated and help strengthen the muscles. Injuries to the anal muscles may be  repaired with surgery. Some individuals may benefit from a technique that delivers electrical energy to the skin and muscles surrounding the anus which results in firming and thickening of this area to help with continence. In certain individuals that have nerve damage or anal muscles that are damaged beyond repair, an artificial sphincter may be implanted. The artificial sphincter is a plastic, fluid filled doughnut that is surgically implanted around the damaged anal sphincter. This artificial sphincter keeps the anal canal closed. When an individual wants to have a bowel movement, the fluid can be pumped out of the doughnut to allow the anal canal to open. In extreme cases, patients may find that a colostomy is the best option for improving their quality of life. What is a colon and rectal surgeon? Colon and rectal surgeons are experts in the surgical and non-surgical treatment of diseases of the colon, rectum and anus. They have completed advanced surgical training in the treatment of these diseases as well as full general surgical training. Board-certified colon and rectal surgeons complete residencies in general surgery and colon and rectal surgery, and pass intensive examinations conducted by the American Board of Surgery and the American Board of Colon and Rectal Surgery. They are well-versed in the treatment of both benign and malignant diseases of the colon, rectum and anus and are able to perform routine screening examinations and surgically treat conditions if indicated to do so.  2012 American Society of Colon & Rectal Surgeons   GETTING TO GOOD BOWEL HEALTH. Irregular bowel habits such as constipation can lead to many problems over time.  Having one soft bowel movement a day is the most important way to prevent further problems.  The anorectal canal is designed to handle stretching and feces to safely manage our ability to get rid of solid waste (feces, poop, stool) out of our body.  BUT, hard  constipated stools can act like ripping concrete bricks causing inflamed hemorrhoids, anal fissures, abdominal pain and bloating.     The goal: ONE SOFT BOWEL MOVEMENT A DAY!  To have soft, regular bowel movements:    Drink at least 8 tall glasses of water a day.     Take plenty of fiber.  Fiber is the undigested part of plant food that passes into the colon, acting s "natures broom" to encourage bowel motility and movement.  Fiber can absorb and hold large amounts of water. This results in a larger, bulkier stool, which is soft and easier to pass. Work gradually over several weeks up to 6 servings a day of fiber (25g a day even more if needed) in the form of: o Vegetables -- Root (potatoes, carrots, turnips), leafy green (lettuce, salad greens, celery, spinach), or cooked high residue (cabbage, broccoli, etc) o Fruit -- Fresh (unpeeled skin & pulp), Dried (prunes, apricots, cherries, etc ),  or stewed ( applesauce)  o Whole grain breads, pasta, etc (whole wheat)  o Bran cereals    Bulking Agents -- This type of water-retaining fiber generally is easily obtained each day by one of the following:  o Psyllium bran -- The psyllium plant is remarkable because its ground seeds can retain so much water. This product is available as Metamucil, Konsyl, Effersyllium, Per Diem Fiber, or the   less expensive generic preparation in drug and health food stores. Although labeled a laxative, it really is not a laxative.  o Methylcellulose -- This is another fiber derived from wood which also retains water. It is available as Citrucel. o Polyethylene Glycol - and "artificial" fiber commonly called Miralax or Glycolax.  It is helpful for people with gassy or bloated feelings with regular fiber o Flax Seed - a less gassy fiber than psyllium   No reading or other relaxing activity while on the toilet. If bowel movements take longer than 5 minutes, you are too constipated.   AVOID CONSTIPATION.  High fiber and water intake  usually takes care of this.  Sometimes a laxative is needed to stimulate more frequent bowel movements, but    Laxatives are not a good long-term solution as it can wear the colon out. o Osmotics (Milk of Magnesia, Fleets phosphosoda, Magnesium citrate, MiraLax, GoLytely) are safer than  o Stimulants (Senokot, Castor Oil, Dulcolax, Ex Lax)    o Do not take laxatives for more than 7days in a row.    IF SEVERELY CONSTIPATED, try a Bowel Retraining Program: o Do not use laxatives.  o Eat a diet high in roughage, such as bran cereals and leafy vegetables.  o Drink six (6) ounces of prune or apricot juice each morning.  o Eat two (2) large servings of stewed fruit each day.  o Take one (1) heaping tablespoon of a psyllium-based bulking agent twice a day. Use sugar-free sweetener when possible to avoid excessive calories.  o Eat a normal breakfast.  o Set aside 15 minutes after breakfast to sit on the toilet, but do not strain to have a bowel movement.  o If you do not have a bowel movement by the third day, use an enema and repeat the above steps.    

## 2013-04-29 NOTE — Progress Notes (Signed)
Chief Complaint  Patient presents with  . New Evaluation    eval rectal incontinence    HISTORY: Doris Carr is a 77 y.o. female who presents to the office with fecal incontinence.  She appears to have a long-standing history of constipation.  She is now passing solid stool without her knowledge.  This had been occurring for about 6 months.   Nothing makes the symptoms worse.   It is intermittent in nature.  Her bowel habits are irregular and her bowel movements are soft.  She denies straining.  Her fiber intake is dietary.  She has a 50lb intentional weight loss in the last yr.  She has never had a colonoscopy.  She has had 3 vaginal deliveries, it sounds like she had some stiches with each one.  She denies incontinence to gas but does have liquid stool that leaks out 3-4 days a week.  She denies vaginal splinting with BM's.  Past Medical History  Diagnosis Date  . Balance problems   . Syncope and collapse   . Left bundle branch block   . CKD (chronic kidney disease)     likely due to DM2 and HTN  . Lower extremity edema     CHRONIC  . Depression   . Anxiety   . Obesity   . 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 Hypertension     This has been difficult to control. She has had renal artery doppler evaluation on two different occasions, both times with no evidence for renal artery stenosis. She has had urinary catecholeamine collection that was unremarkable. Edema with 10 mg amlodipine.   . Peripheral vascular disease     Lower extremity evaluation (12/12): ABI 0.79 right, 0.64 left; > 50% mid left SFA stenosis;  s/p L SFA stent and stent to R CIA 11/2011; ABI's post PTA - R 0.90, L 0.96 (TBI on R 0.51, L 0.61)  . GERD (gastroesophageal reflux disease)   . Diabetes mellitus     type  2  . Lung nodule     VATS and biopsy in 2006 that was negative for malignancy  . S/P cardiac catheterization     Left heart cath in 6/06 with normal coronaries, EF 60%.  . Back  pain     sees a neurosurgeon  . HLD (hyperlipidemia)     myalgias with statins  . Chronic diastolic heart failure     Echo (8/12) with EF 60-65%, moderate LV hypertrophy, mild LVH      Past Surgical History  Procedure Laterality Date  . Thoracotomy    . Cardiac catheterization  2006    NORMAL CORONARIES  . Cervical cone biopsy    . Thymic cyst removal    . Cataract extraction    . Tonsillectomy    . Abdominal aortogram  11/09/2011  . Gynecologic cryosurgery    . Colposcopy    . Vaginal hysterectomy  1985    WITH A/P REPAIR  . Stints in leg arteries          Current Outpatient Prescriptions  Medication Sig Dispense Refill  . amLODipine (NORVASC) 5 MG tablet Take 5 mg by mouth 2 (two) times daily.       . aspirin EC 81 MG tablet Take 1 tablet (81 mg total) by mouth daily.      . benzonatate (TESSALON) 200 MG capsule Take 200 mg by mouth 2 (two) times daily as needed.   For cough      . carvedilol (COREG) 12.5 MG tablet Take 25 mg by mouth 2 (two) times daily.        . chlorthalidone (HYGROTON) 25 MG tablet TAKE ONE HALF TABLET ONCE DAILY  30 tablet  1  . cimetidine (TAGAMET) 400 MG tablet Take 400 mg by mouth 4 (four) times daily as needed. For heartburn      . citalopram (CELEXA) 20 MG tablet Take 20 mg by mouth daily.        . clonazePAM (KLONOPIN) 0.5 MG tablet Take 0.5 mg by mouth 2 (two) times daily as needed. 0.5 tab in the am, 1 tab in the pm      . clopidogrel (PLAVIX) 75 MG tablet Take 1 tablet (75 mg total) by mouth daily.  30 tablet  5  . Estropipate (OGEN 1.25 PO) Take by mouth.      . estropipate (OGEN) 1.5 MG tablet Take one by mouth in the morning  30 tablet  12  . fluconazole (DIFLUCAN) 150 MG tablet Take 1 tablet (150 mg total) by mouth once. As needed for yeast  10 tablet  0  . L-Methylfolate-B6-B12 (METANX) 3-35-2 MG TABS Take 1 tablet by mouth 2 (two) times daily.        . losartan (COZAAR) 50 MG tablet Take 1 tablet (50 mg total) by mouth 2 (two) times daily.   180 tablet  3  . metFORMIN (GLUCOPHAGE-XR) 500 MG 24 hr tablet Take 500 mg by mouth Daily.       . spironolactone (ALDACTONE) 25 MG tablet TAKE 1 TABLET BY MOUTH DAILY  30 tablet  5  . terbinafine (LAMISIL) 250 MG tablet Take 250 mg by mouth daily.      . traZODone (DESYREL) 50 MG tablet Take 50 mg by mouth at bedtime. HALF A PILL AT BEDTIME       No current facility-administered medications for this visit.      Allergies  Allergen Reactions  . Ciprofloxacin   . Codeine   . Darvocet (Propoxyphene-Acetaminophen)   . Darvon   . Hydralazine   . Hydrocodone Nausea And Vomiting  . Morphine And Related   . Pentazocine Lactate   . Potassium-Containing Compounds   . Procaine Hcl   . Sulfa Drugs Cross Reactors       Family History  Problem Relation Age of Onset  . Alzheimer's disease Mother   . Hypertension Mother   . Heart disease Mother   . Alzheimer's disease Father   . Diabetes Paternal Grandfather     History   Social History  . Marital Status: Married    Spouse Name: N/A    Number of Children: N/A  . Years of Education: N/A   Social History Main Topics  . Smoking status: Former Smoker -- 0.80 packs/day for 15 years    Types: Cigarettes    Quit date: 11/03/1984  . Smokeless tobacco: Never Used  . Alcohol Use: No  . Drug Use: No  . Sexually Active: Yes    Birth Control/ Protection: Surgical, Post-menopausal   Other Topics Concern  . None   Social History Narrative  . None      REVIEW OF SYSTEMS - PERTINENT POSITIVES ONLY: Review of Systems - General ROS: negative for - chills, fever or weight loss Hematological and Lymphatic ROS: negative for - bleeding problems, blood clots or bruising Respiratory ROS: no cough, shortness of breath, or wheezing Cardiovascular ROS: no chest pain or dyspnea   on exertion Gastrointestinal ROS: no abdominal pain, change in bowel habits, or black or bloody stools Genito-Urinary ROS: no dysuria, trouble voiding, or  hematuria  EXAM: Filed Vitals:   04/29/13 1524  BP: 122/68  Pulse: 60  Temp: 98 F (36.7 C)  Resp: 16    General appearance: alert and cooperative Resp: clear to auscultation bilaterally Cardio: regular rate and rhythm GI: soft, non-tender; bowel sounds normal; no masses,  no organomegaly   Procedure: Anoscopy Surgeon: Marquee Fuchs Diagnosis: fecal incontinence  Assistant: Glaspey After the risks and benefits were explained, verbal consent was obtained for above procedure  Anesthesia: none Findings: min squeeze, good push, moderate rectal tone, no masses, grade 2 internal hemorrhoids, anterior skin tag noted with mild prolapse, small to moderate rectocele      ASSESSMENT AND PLAN: Doris Carr is a 77 y.o. F with fecal incontinence for the last 6 months.  I have recommended that she undergo a colonoscopy, since she has never had one, but she refuses this.  I think we should at least do a proctoscopy to evaluate for any rectal masses.  I certainly don't feel anything on exam. I will do an anal US and anal manometry at the same time to continue her workup.  In the meantime, I have asked her to use a fiber supplement daily.  I do not think that her rectocele is contributing to her symptoms at this time.  I have given her a handout on treatment options that we will discuss in more detail in the future, depending on the results of her test.     Tonimarie Gritz C Lavayah Vita, MD Colon and Rectal Surgery / General Surgery [facility]      Visit Diagnoses: 1. Fecal incontinence     Primary Care Physician: ROSS,CHARLES ALAN, MD   

## 2013-05-02 ENCOUNTER — Telehealth (INDEPENDENT_AMBULATORY_CARE_PROVIDER_SITE_OTHER): Payer: Self-pay

## 2013-05-02 NOTE — Telephone Encounter (Signed)
I called the pt and answered her questions.  I explained when and how to do the 2 enemas.  They are to be done at 2 hours and then 1 hour prior to leaving the house.  I advised her to hold it in for about 10 minutes then she releases the contents.  I told her we did not need her to stop her blood thinners for this.  I told her to not eat or drink after midnight the day before. I explained she will not be sedated so Dr Maisie Fus will explain what all she is going to do at the time.  The pt will call for any further questions.

## 2013-05-02 NOTE — Telephone Encounter (Signed)
Message copied by Ivory Broad on Thu May 02, 2013 12:14 PM ------      Message from: Docia Chuck      Created: Thu May 02, 2013 12:01 PM      Regarding: Please call        Doris Carr has some questions about the instructions given to her about what to do before the procedure.  She will be home today until 230 pm and then home tomorrow morning.  She has asked that you try to reach her and not leave her a message.            Thanks ------

## 2013-05-06 DIAGNOSIS — R350 Frequency of micturition: Secondary | ICD-10-CM | POA: Diagnosis not present

## 2013-05-07 ENCOUNTER — Telehealth (INDEPENDENT_AMBULATORY_CARE_PROVIDER_SITE_OTHER): Payer: Self-pay

## 2013-05-07 NOTE — Telephone Encounter (Signed)
Patient is asking if she should stop her plavix; Huntley Dec advised it was ok to take her plavix.

## 2013-05-08 ENCOUNTER — Encounter (HOSPITAL_COMMUNITY)
Admission: RE | Disposition: A | Discharge status: Home / Self Care | Payer: Self-pay | Source: Ambulatory Visit | Attending: General Surgery

## 2013-05-08 ENCOUNTER — Ambulatory Visit (HOSPITAL_COMMUNITY)
Admission: RE | Admit: 2013-05-08 | Discharge: 2013-05-08 | Disposition: A | Discharge status: Home / Self Care | Payer: Medicare Other | Source: Ambulatory Visit | Attending: General Surgery | Admitting: General Surgery

## 2013-05-08 ENCOUNTER — Encounter (HOSPITAL_COMMUNITY): Payer: Self-pay

## 2013-05-08 ENCOUNTER — Other Ambulatory Visit: Payer: Self-pay

## 2013-05-08 DIAGNOSIS — E119 Type 2 diabetes mellitus without complications: Secondary | ICD-10-CM | POA: Insufficient documentation

## 2013-05-08 DIAGNOSIS — N189 Chronic kidney disease, unspecified: Secondary | ICD-10-CM | POA: Insufficient documentation

## 2013-05-08 DIAGNOSIS — K5900 Constipation, unspecified: Secondary | ICD-10-CM | POA: Diagnosis not present

## 2013-05-08 DIAGNOSIS — I129 Hypertensive chronic kidney disease with stage 1 through stage 4 chronic kidney disease, or unspecified chronic kidney disease: Secondary | ICD-10-CM | POA: Insufficient documentation

## 2013-05-08 DIAGNOSIS — E785 Hyperlipidemia, unspecified: Secondary | ICD-10-CM | POA: Diagnosis not present

## 2013-05-08 DIAGNOSIS — R159 Full incontinence of feces: Secondary | ICD-10-CM | POA: Diagnosis not present

## 2013-05-08 DIAGNOSIS — I447 Left bundle-branch block, unspecified: Secondary | ICD-10-CM | POA: Insufficient documentation

## 2013-05-08 DIAGNOSIS — Z79899 Other long term (current) drug therapy: Secondary | ICD-10-CM | POA: Diagnosis not present

## 2013-05-08 DIAGNOSIS — K219 Gastro-esophageal reflux disease without esophagitis: Secondary | ICD-10-CM | POA: Diagnosis not present

## 2013-05-08 DIAGNOSIS — R197 Diarrhea, unspecified: Secondary | ICD-10-CM | POA: Diagnosis not present

## 2013-05-08 HISTORY — PX: FLEXIBLE SIGMOIDOSCOPY: SHX5431

## 2013-05-08 HISTORY — PX: ANAL RECTAL MANOMETRY: SHX6358

## 2013-05-08 HISTORY — PX: RECTAL ULTRASOUND: SHX2306

## 2013-05-08 SURGERY — US RECTUM
Wound class: Clean Contaminated

## 2013-05-08 NOTE — Interval H&P Note (Signed)
History and Physical Interval Note:  05/08/2013 10:56 AM  Doris Carr  has presented today for surgery, with the diagnosis of fecal incontinence  The various methods of treatment have been discussed with the patient and family. After consideration of risks, benefits and other options for treatment, the patient has consented to  Procedure(s) with comments: RECTAL ULTRASOUND (N/A) rigid proctoscopy ANAL RECTAL MANOMETRY (N/A) as a surgical intervention .  The patient's history has been reviewed, patient examined, no change in status, stable for surgery.  I have reviewed the patient's chart and labs.  Questions were answered to the patient's satisfaction.     Vanita Panda, MD  Colorectal and General Surgery [facility]

## 2013-05-08 NOTE — Op Note (Addendum)
[facility] [address] [place], [address]   FLEXIBLE SIGMOIDOSCOPY PROCEDURE REPORT  PATIENT: Doris Carr, Doris Carr  MR#: [id number] BIRTHDATE: [birth year] , 77  yrs. old GENDER: Female ENDOSCOPIST: Vanita Panda, MD REFERRED BY: PROCEDURE DATE:  05/08/2013 PROCEDURE:   Proctoscopy, diagnostoc .  Anal [place] ASA CLASS:   Class II INDICATIONS:average risk patient for colon cancer.   unexplained diarrhea. MEDICATIONS: None  DESCRIPTION OF PROCEDURE:   After the risks benefits and alternatives of the procedure were thoroughly explained, informed consent was obtained.  revealed no abnormalities of the rectum. The endoscope was introduced through the anus  and advanced to the rectosigmoid junction (16cm).  No adverse events experienced.   The quality of the prep was excellent .  The instrument was then slowly withdrawn as the mucosa was fully examined.  The scope was then withdrawn from the patient and the procedure terminated.   After this was completed, I introduced the anal US probe.  The levators were indentied and the probe was oreinted.  The internal and external sphincters were identified and appears to be intact. I saw no consistent breaks, but the sphincters did thin somewhat at the perineal body region.   COLON FINDINGS: The colonic mucosa appeared normal.  The sphincters were intact. Re  COMPLICATIONS: There were no complications.  ENDOSCOPIC IMPRESSION: The colonic mucosa appeared normal  RECOMMENDATIONS: Follow up in office Recommend full colonoscopy for colon cancer screening  REPEAT EXAM: f. standard discharge  _______________________________ eSigned:  Vanita Panda, MD 05/08/2013 11:54 AM   CC:

## 2013-05-08 NOTE — H&P (View-Only) (Signed)
Chief Complaint  Patient presents with  . New Evaluation    eval rectal incontinence    HISTORY: Doris Carr is a 77 y.o. female who presents to the office with fecal incontinence.  She appears to have a long-standing history of constipation.  She is now passing solid stool without her knowledge.  This had been occurring for about 6 months.   Nothing makes the symptoms worse.   It is intermittent in nature.  Her bowel habits are irregular and her bowel movements are soft.  She denies straining.  Her fiber intake is dietary.  She has a 50lb intentional weight loss in the last yr.  She has never had a colonoscopy.  She has had 3 vaginal deliveries, it sounds like she had some stiches with each one.  She denies incontinence to gas but does have liquid stool that leaks out 3-4 days a week.  She denies vaginal splinting with BM's.  Past Medical History  Diagnosis Date  . Balance problems   . Syncope and collapse   . Left bundle branch block   . CKD (chronic kidney disease)     likely due to DM2 and HTN  . Lower extremity edema     CHRONIC  . Depression   . Anxiety   . Obesity   . 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 Hypertension     This has been difficult to control. She has had renal artery doppler evaluation on two different occasions, both times with no evidence for renal artery stenosis. She has had urinary catecholeamine collection that was unremarkable. Edema with 10 mg amlodipine.   . Peripheral vascular disease     Lower extremity evaluation (12/12): ABI 0.79 right, 0.64 left; > 50% mid left SFA stenosis;  s/p L SFA stent and stent to R CIA 11/2011; ABI's post PTA - R 0.90, L 0.96 (TBI on R 0.51, L 0.61)  . GERD (gastroesophageal reflux disease)   . Diabetes mellitus     type  2  . Lung nodule     VATS and biopsy in 2006 that was negative for malignancy  . S/P cardiac catheterization     Left heart cath in 6/06 with normal coronaries, EF 60%.  . Back  pain     sees a neurosurgeon  . HLD (hyperlipidemia)     myalgias with statins  . Chronic diastolic heart failure     Echo (8/12) with EF 60-65%, moderate LV hypertrophy, mild LVH      Past Surgical History  Procedure Laterality Date  . Thoracotomy    . Cardiac catheterization  2006    NORMAL CORONARIES  . Cervical cone biopsy    . Thymic cyst removal    . Cataract extraction    . Tonsillectomy    . Abdominal aortogram  11/09/2011  . Gynecologic cryosurgery    . Colposcopy    . Vaginal hysterectomy  1985    WITH A/P REPAIR  . Stints in leg arteries          Current Outpatient Prescriptions  Medication Sig Dispense Refill  . amLODipine (NORVASC) 5 MG tablet Take 5 mg by mouth 2 (two) times daily.       Marland Kitchen aspirin EC 81 MG tablet Take 1 tablet (81 mg total) by mouth daily.      . benzonatate (TESSALON) 200 MG capsule Take 200 mg by mouth 2 (two) times daily as needed.  For cough      . carvedilol (COREG) 12.5 MG tablet Take 25 mg by mouth 2 (two) times daily.        . chlorthalidone (HYGROTON) 25 MG tablet TAKE ONE HALF TABLET ONCE DAILY  30 tablet  1  . cimetidine (TAGAMET) 400 MG tablet Take 400 mg by mouth 4 (four) times daily as needed. For heartburn      . citalopram (CELEXA) 20 MG tablet Take 20 mg by mouth daily.        . clonazePAM (KLONOPIN) 0.5 MG tablet Take 0.5 mg by mouth 2 (two) times daily as needed. 0.5 tab in the am, 1 tab in the pm      . clopidogrel (PLAVIX) 75 MG tablet Take 1 tablet (75 mg total) by mouth daily.  30 tablet  5  . Estropipate (OGEN 1.25 PO) Take by mouth.      . estropipate (OGEN) 1.5 MG tablet Take one by mouth in the morning  30 tablet  12  . fluconazole (DIFLUCAN) 150 MG tablet Take 1 tablet (150 mg total) by mouth once. As needed for yeast  10 tablet  0  . L-Methylfolate-B6-B12 (METANX) 3-35-2 MG TABS Take 1 tablet by mouth 2 (two) times daily.        Marland Kitchen losartan (COZAAR) 50 MG tablet Take 1 tablet (50 mg total) by mouth 2 (two) times daily.   180 tablet  3  . metFORMIN (GLUCOPHAGE-XR) 500 MG 24 hr tablet Take 500 mg by mouth Daily.       Marland Kitchen spironolactone (ALDACTONE) 25 MG tablet TAKE 1 TABLET BY MOUTH DAILY  30 tablet  5  . terbinafine (LAMISIL) 250 MG tablet Take 250 mg by mouth daily.      . traZODone (DESYREL) 50 MG tablet Take 50 mg by mouth at bedtime. HALF A PILL AT BEDTIME       No current facility-administered medications for this visit.      Allergies  Allergen Reactions  . Ciprofloxacin   . Codeine   . Darvocet (Propoxyphene-Acetaminophen)   . Darvon   . Hydralazine   . Hydrocodone Nausea And Vomiting  . Morphine And Related   . Pentazocine Lactate   . Potassium-Containing Compounds   . Procaine Hcl   . Sulfa Drugs Cross Reactors       Family History  Problem Relation Age of Onset  . Alzheimer's disease Mother   . Hypertension Mother   . Heart disease Mother   . Alzheimer's disease Father   . Diabetes Paternal Grandfather     History   Social History  . Marital Status: Married    Spouse Name: N/A    Number of Children: N/A  . Years of Education: N/A   Social History Main Topics  . Smoking status: Former Smoker -- 0.80 packs/day for 15 years    Types: Cigarettes    Quit date: 11/03/1984  . Smokeless tobacco: Never Used  . Alcohol Use: No  . Drug Use: No  . Sexually Active: Yes    Birth Control/ Protection: Surgical, Post-menopausal   Other Topics Concern  . None   Social History Narrative  . None      REVIEW OF SYSTEMS - PERTINENT POSITIVES ONLY: Review of Systems - General ROS: negative for - chills, fever or weight loss Hematological and Lymphatic ROS: negative for - bleeding problems, blood clots or bruising Respiratory ROS: no cough, shortness of breath, or wheezing Cardiovascular ROS: no chest pain or dyspnea  on exertion Gastrointestinal ROS: no abdominal pain, change in bowel habits, or black or bloody stools Genito-Urinary ROS: no dysuria, trouble voiding, or  hematuria  EXAM: Filed Vitals:   04/29/13 1524  BP: 122/68  Pulse: 60  Temp: 98 F (36.7 C)  Resp: 16    General appearance: alert and cooperative Resp: clear to auscultation bilaterally Cardio: regular rate and rhythm GI: soft, non-tender; bowel sounds normal; no masses,  no organomegaly   Procedure: Anoscopy Surgeon: Maisie Fus Diagnosis: fecal incontinence  Assistant: Christella Scheuermann After the risks and benefits were explained, verbal consent was obtained for above procedure  Anesthesia: none Findings: min squeeze, good push, moderate rectal tone, no masses, grade 2 internal hemorrhoids, anterior skin tag noted with mild prolapse, small to moderate rectocele      ASSESSMENT AND PLAN: Doris Carr is a 77 y.o. F with fecal incontinence for the last 6 months.  I have recommended that she undergo a colonoscopy, since she has never had one, but she refuses this.  I think we should at least do a proctoscopy to evaluate for any rectal masses.  I certainly don't feel anything on exam. I will do an anal [place] and anal manometry at the same time to continue her workup.  In the meantime, I have asked her to use a fiber supplement daily.  I do not think that her rectocele is contributing to her symptoms at this time.  I have given her a handout on treatment options that we will discuss in more detail in the future, depending on the results of her test.     Vanita Panda, MD Colon and Rectal Surgery / General Surgery [facility]      Visit Diagnoses: 1. Fecal incontinence     Primary Care Physician: Daisy Floro, MD

## 2013-05-09 ENCOUNTER — Encounter (HOSPITAL_COMMUNITY): Payer: Self-pay | Admitting: General Surgery

## 2013-05-09 DIAGNOSIS — R159 Full incontinence of feces: Secondary | ICD-10-CM

## 2013-05-10 ENCOUNTER — Telehealth (INDEPENDENT_AMBULATORY_CARE_PROVIDER_SITE_OTHER): Payer: Self-pay | Admitting: General Surgery

## 2013-05-10 NOTE — Telephone Encounter (Signed)
We will discuss at her apt.  Too much information to go over on the phone.

## 2013-05-10 NOTE — Telephone Encounter (Signed)
Patient called and left voice mail wanting to know the results of the 3rd test done.  Please call.

## 2013-05-10 NOTE — Telephone Encounter (Signed)
I called the pt and notified her that Dr Maisie Fus will discuss results in the office.  I gave her an appointment for Tuesday 8/12 at 0940.

## 2013-05-14 ENCOUNTER — Ambulatory Visit (INDEPENDENT_AMBULATORY_CARE_PROVIDER_SITE_OTHER): Payer: Medicare Other | Admitting: General Surgery

## 2013-05-14 ENCOUNTER — Encounter (INDEPENDENT_AMBULATORY_CARE_PROVIDER_SITE_OTHER): Payer: Self-pay | Admitting: General Surgery

## 2013-05-14 VITALS — BP 100/58 | HR 64 | Resp 14 | Ht 64.0 in | Wt 147.4 lb

## 2013-05-14 DIAGNOSIS — R159 Full incontinence of feces: Secondary | ICD-10-CM

## 2013-05-14 HISTORY — DX: Full incontinence of feces: R15.9

## 2013-05-14 NOTE — Progress Notes (Signed)
Doris Carr is a 78 y.o. female who is here for a follow up visit regarding her fecal incontinence.  This is better with fiber supp bid.    Objective: Filed Vitals:   05/14/13 0951  BP: 100/58  Pulse: 64  Resp: 14    General appearance: alert and cooperative GI: soft, non-tender; bowel sounds normal; no masses,  no organomegaly   Assessment and Plan: We reviewed her anal [place] results which show intact sphincters.  We reviewed her manometry, which shows decreased squeeze pressures and some increased push pressures.  She feels better on the fiber.  I will have her keep a diary for the next 2 months to see how her symptoms progress.    Vanita Panda, MD [facility], [place] [phone]

## 2013-05-14 NOTE — Patient Instructions (Signed)
Continue fiber daily.  Keep a diary of any leakage over the next 2 months.

## 2013-05-15 ENCOUNTER — Other Ambulatory Visit: Payer: Self-pay | Admitting: Obstetrics and Gynecology

## 2013-06-11 DIAGNOSIS — E119 Type 2 diabetes mellitus without complications: Secondary | ICD-10-CM | POA: Diagnosis not present

## 2013-06-11 DIAGNOSIS — F411 Generalized anxiety disorder: Secondary | ICD-10-CM | POA: Diagnosis not present

## 2013-06-11 DIAGNOSIS — Z23 Encounter for immunization: Secondary | ICD-10-CM | POA: Diagnosis not present

## 2013-06-11 DIAGNOSIS — I10 Essential (primary) hypertension: Secondary | ICD-10-CM | POA: Diagnosis not present

## 2013-07-16 ENCOUNTER — Encounter (INDEPENDENT_AMBULATORY_CARE_PROVIDER_SITE_OTHER): Payer: Self-pay | Admitting: General Surgery

## 2013-07-16 ENCOUNTER — Ambulatory Visit (INDEPENDENT_AMBULATORY_CARE_PROVIDER_SITE_OTHER): Payer: Medicare Other | Admitting: General Surgery

## 2013-07-16 VITALS — BP 98/56 | HR 68 | Temp 98.6°F | Resp 14 | Ht 64.0 in | Wt 149.8 lb

## 2013-07-16 DIAGNOSIS — R159 Full incontinence of feces: Secondary | ICD-10-CM

## 2013-07-16 NOTE — Progress Notes (Signed)
Doris Carr is a 77 y.o. female who is here for a follow up visit regarding for her fecal incontinence.  She "feels 200% better".  She is not having any major leakage.  She has occasional drainage when walking for long periods of time but not during yoga.  She is having regular formed bowel movements.  She is happy with this management of benefiber in the morning and metamucil tabs at night.    Objective: Filed Vitals:   07/16/13 1034  BP: 98/56  Pulse: 68  Temp: 98.6 F (37 C)  Resp: 14    General appearance: alert and cooperative GI: normal findings: soft, non-tender anal exam: no skin irritation or fecal drainage   Assessment and Plan: Doing well.  F/U PRN    .Vanita Panda, MD [facility], [place] [phone]

## 2013-07-16 NOTE — Patient Instructions (Signed)
Call us if you have any issues.  

## 2013-07-21 ENCOUNTER — Other Ambulatory Visit: Payer: Self-pay | Admitting: Cardiology

## 2013-08-08 ENCOUNTER — Other Ambulatory Visit: Payer: Self-pay

## 2013-08-12 ENCOUNTER — Other Ambulatory Visit: Payer: Self-pay | Admitting: Cardiovascular Disease

## 2013-08-17 ENCOUNTER — Other Ambulatory Visit: Payer: Self-pay | Admitting: Cardiology

## 2013-08-28 ENCOUNTER — Encounter: Payer: Self-pay | Admitting: Cardiology

## 2013-08-28 ENCOUNTER — Ambulatory Visit (INDEPENDENT_AMBULATORY_CARE_PROVIDER_SITE_OTHER): Payer: Medicare Other | Admitting: Cardiology

## 2013-08-28 VITALS — BP 120/58 | HR 60 | Ht 64.0 in | Wt 150.0 lb

## 2013-08-28 DIAGNOSIS — I10 Essential (primary) hypertension: Secondary | ICD-10-CM

## 2013-08-28 DIAGNOSIS — I5032 Chronic diastolic (congestive) heart failure: Secondary | ICD-10-CM | POA: Diagnosis not present

## 2013-08-28 DIAGNOSIS — I70219 Atherosclerosis of native arteries of extremities with intermittent claudication, unspecified extremity: Secondary | ICD-10-CM | POA: Diagnosis not present

## 2013-08-28 DIAGNOSIS — E785 Hyperlipidemia, unspecified: Secondary | ICD-10-CM

## 2013-08-28 DIAGNOSIS — I509 Heart failure, unspecified: Secondary | ICD-10-CM

## 2013-08-28 LAB — LIPID PANEL
Cholesterol: 232 mg/dL — ABNORMAL HIGH (ref 0–200)
HDL: 59.4 mg/dL (ref >39.00)
Triglycerides: 71 mg/dL (ref 0.0–149.0)

## 2013-08-28 LAB — BASIC METABOLIC PANEL
Calcium: 9.5 mg/dL (ref 8.4–10.5)
GFR: 34.84 mL/min — ABNORMAL LOW (ref >60.00)
Sodium: 135 mEq/L (ref 135–145)

## 2013-08-28 MED ORDER — AMLODIPINE BESYLATE 5 MG PO TABS: 5.0000 mg | ORAL_TABLET | Freq: Every day | ORAL | Status: DC

## 2013-08-28 NOTE — Patient Instructions (Signed)
Decrease amlodipine to 5mg  daily.   Your physician recommends that you return for lab work today--BMET/Lipid profile.  Your physician wants you to follow-up in: 6 months with Dr Shirlee Latch. (May 2015).You will receive a reminder letter in the mail two months in advance. If you don't receive a letter, please call our office to schedule the follow-up appointment.

## 2013-08-28 NOTE — Progress Notes (Signed)
Patient ID: Doris Carr, female   DOB: [birth year], 77 y.o.   MRN: [id number] PCP: Dr. Tenny Craw  77 yo with history of CKD, diabetes, chronic LBBB, PAD, and difficult to control HTN presents for cardiology followup.  Blood pressure has been much better since adding spironolactone.  It has been running low at times.  She had a lower extremity angiogram in 2/13 with PTA and stenting of the left SFA and right CIA.  Renal arteries had no significant stenosis.  She has done quite well since the procedure.  She does not have claudication now.  Since last appointment with me, she has lost 15 lbs. No exertional dyspnea, no chest pain.  She has been tried on multiple cholesterol meds and has not tolerated any.  Main problem at this time is low back pain.   ECG: NSR, LBBB (chronic)  Labs (7/12): HgbA1c 5.9, K 3.8, creatinine 1.49, BUN 47 Labs (8/12): BNP 108, K 3.7, creatinine 1.3 => 1.4 Labs (12/12): K 4.5, creatinine 1.4 Labs (2/13): K 4.7, creatinine 0.96 Labs (6/13): K 4.7, creatinine 1.5, LDL 169  PMH: 1.  HTN: This has been difficult to control.  She has had renal artery doppler evaluation on two different occasions, both times with no evidence for renal artery stenosis.  She has had urinary catecholeamine collection that was unremarkable.  Edema with 10 mg amlodipine.  Renal artery angiogram in 2/13 showed no renal artery stenosis.  2. Diabetes mellitus type II: improved, no longer on medications.  3. CKD: Likely due to diabetes and HTN. 4. Chronic LBBB 5. Lung nodule with VATS and biopsy in 2006 that was negative for malignancy.  6. Depression 7. Anxiety 8. Obesity 9. Left heart cath in 6/06 with normal coronaries, EF 60%.  10.  Back pain: Sees a neurosurgeon.  11.  Hyperlipidemia: Myalgias with statins.  12.  Diastolic CHF: Echo (8/12) with EF 60-65%, moderate LV hypertrophy, mild LVH. 13.  PAD: Normal ABIs at [facility] in 6/11.  Lower extremity evaluation (12/12): ABI 0.79 right, 0.64 left; > 50% mid  left SFA stenosis.  Peripheral angiogram in 2/13 with PTA and stenting of the left SFA and right CIA.  ABIs (5/14) 1.0 right/1.2 L; dopplers showed patent left SFA stent and moderate left CIA stenosis.   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  Nonsmoker.    FH: Mother with pacemaker and Alzheimer's-type dementia.   ROS: All systems reviewed and negative except as per HPI.    Current Outpatient Prescriptions  Medication Sig Dispense Refill  . aspirin EC 81 MG tablet Take 1 tablet (81 mg total) by mouth daily.      . benzonatate (TESSALON) 200 MG capsule Take 200 mg by mouth 2 (two) times daily as needed. For cough      . carvedilol (COREG) 12.5 MG tablet Take 25 mg by mouth 2 (two) times daily.        . chlorthalidone (HYGROTON) 25 MG tablet TAKE 1/2 TABLET BY MOUTH ONCE DAILY  30 tablet  3  . cimetidine (TAGAMET) 400 MG tablet Take 400 mg by mouth 4 (four) times daily as needed. For heartburn      . citalopram (CELEXA) 20 MG tablet Take 20 mg by mouth daily.        . clonazePAM (KLONOPIN) 0.5 MG tablet Take 0.5 mg by mouth 2 (two) times daily as needed. 0.5 tab in the am, 1 tab in the pm      . clopidogrel (  PLAVIX) 75 MG tablet Take 1 tablet (75 mg total) by mouth daily.  30 tablet  5  . Estropipate (OGEN 1.25 PO) Take by mouth.      . estropipate (OGEN) 1.5 MG tablet Take one by mouth in the morning  30 tablet  12  . fluconazole (DIFLUCAN) 150 MG tablet Take 1 tablet (150 mg total) by mouth once. As needed for yeast  10 tablet  0  . L-Methylfolate-B6-B12 (METANX) 3-35-2 MG TABS Take 1 tablet by mouth 2 (two) times daily.        Marland Kitchen losartan (COZAAR) 50 MG tablet Take 1 tablet (50 mg total) by mouth 2 (two) times daily.  180 tablet  3  . metFORMIN (GLUCOPHAGE-XR) 500 MG 24 hr tablet Take 500 mg by mouth daily as needed.       Marland Kitchen spironolactone (ALDACTONE) 25 MG tablet TAKE 1 TABLET BY MOUTH EVERY DAY  30 tablet  0  . terbinafine (LAMISIL) 250 MG tablet Take 250 mg by mouth  daily.      . traZODone (DESYREL) 50 MG tablet Take 50 mg by mouth at bedtime. HALF A PILL AT BEDTIME      . amLODipine (NORVASC) 5 MG tablet Take 1 tablet (5 mg total) by mouth daily.  30 tablet  11   No current facility-administered medications for this visit.    BP 120/58  Pulse 60  Ht 5\' 4"  (1.626 m)  Wt 150 lb (68.04 kg)  BMI 25.73 kg/m2 General: NAD, overweight Neck: No JVD, no thyromegaly or thyroid nodule.  Lungs: Clear to auscultation bilaterally with normal respiratory effort. CV: Nondisplaced PMI.  Heart regular S1/S2, soft S4, no murmur.  No edema.  No carotid bruit.  Trace PT pulses, no foot ulcerations. Bilateral lower leg venous varicosities.  Abdomen: Soft, nontender, no hepatosplenomegaly, no distention.   Neurologic: Alert and oriented x 3.  Psych: Normal affect. Extremities: No clubbing or cyanosis.   Assessment/Plan: 1. HTN: BP better on spironolactone (suspect aldosterone-related HTN) and with weight loss. I think we can start cutting back on some of her medications.  Will decrease amlodipine to 5 mg daily.  Check BMET today.  2. Hyperlipidemia: Has PAD but unable to tolerate any statin, Zetia, or Niacin.  Will check lipids today, hopefully there has been some improvement with weight loss.  3. PAD: Stable with no claudication.  ABIs in 5/14 were normal.  To follow yearly with Dr. Excell Seltzer.  4. Diastolic CHF: No volume overload.   Marca Ancona 08/28/2013

## 2013-08-30 ENCOUNTER — Telehealth: Payer: Self-pay | Admitting: *Deleted

## 2013-08-30 DIAGNOSIS — E875 Hyperkalemia: Secondary | ICD-10-CM

## 2013-08-30 NOTE — Telephone Encounter (Signed)
Repeat BMET 12/5

## 2013-08-30 NOTE — Telephone Encounter (Signed)
Message copied by Lorayne Bender on Fri Aug 30, 2013  8:47 AM ------      Message from: Laurey Morale      Created: Thu Aug 29, 2013 10:13 PM       K is high.  Low K diet, repeat BMET in 1 week. ------

## 2013-08-30 NOTE — Telephone Encounter (Signed)
Notified of lab. Repeat BMET 12/4

## 2013-09-05 ENCOUNTER — Other Ambulatory Visit: Payer: Medicare Other

## 2013-09-10 ENCOUNTER — Other Ambulatory Visit (INDEPENDENT_AMBULATORY_CARE_PROVIDER_SITE_OTHER): Payer: Medicare Other

## 2013-09-10 DIAGNOSIS — E875 Hyperkalemia: Secondary | ICD-10-CM | POA: Diagnosis not present

## 2013-09-10 DIAGNOSIS — E119 Type 2 diabetes mellitus without complications: Secondary | ICD-10-CM | POA: Diagnosis not present

## 2013-09-10 DIAGNOSIS — I10 Essential (primary) hypertension: Secondary | ICD-10-CM | POA: Diagnosis not present

## 2013-09-10 LAB — BASIC METABOLIC PANEL
CO2: 26 mEq/L (ref 19–32)
Calcium: 9.7 mg/dL (ref 8.4–10.5)
Creatinine, Ser: 1.9 mg/dL — ABNORMAL HIGH (ref 0.4–1.2)
Glucose, Bld: 153 mg/dL — ABNORMAL HIGH (ref 70–99)

## 2013-09-11 ENCOUNTER — Other Ambulatory Visit: Payer: Self-pay | Admitting: *Deleted

## 2013-09-11 ENCOUNTER — Other Ambulatory Visit: Payer: Self-pay | Admitting: Cardiology

## 2013-09-11 DIAGNOSIS — R748 Abnormal levels of other serum enzymes: Secondary | ICD-10-CM

## 2013-09-11 MED ORDER — LOSARTAN POTASSIUM 50 MG PO TABS: 50.0000 mg | ORAL_TABLET | Freq: Every day | ORAL | Status: DC

## 2013-09-30 ENCOUNTER — Other Ambulatory Visit: Payer: Self-pay | Admitting: *Deleted

## 2013-09-30 MED ORDER — LOSARTAN POTASSIUM 100 MG PO TABS: 50.0000 mg | ORAL_TABLET | Freq: Two times a day (BID) | ORAL | Status: DC

## 2013-10-01 ENCOUNTER — Other Ambulatory Visit (INDEPENDENT_AMBULATORY_CARE_PROVIDER_SITE_OTHER): Payer: Medicare Other

## 2013-10-01 DIAGNOSIS — R748 Abnormal levels of other serum enzymes: Secondary | ICD-10-CM

## 2013-10-01 LAB — BASIC METABOLIC PANEL
BUN: 32 mg/dL — ABNORMAL HIGH (ref 6–23)
Creatinine, Ser: 1.5 mg/dL — ABNORMAL HIGH (ref 0.4–1.2)
GFR: 35.9 mL/min — ABNORMAL LOW (ref >60.00)

## 2013-10-09 ENCOUNTER — Other Ambulatory Visit: Payer: Self-pay | Admitting: Cardiology

## 2013-10-22 DIAGNOSIS — R05 Cough: Secondary | ICD-10-CM | POA: Diagnosis not present

## 2013-10-22 DIAGNOSIS — R059 Cough, unspecified: Secondary | ICD-10-CM | POA: Diagnosis not present

## 2013-10-22 DIAGNOSIS — J111 Influenza due to unidentified influenza virus with other respiratory manifestations: Secondary | ICD-10-CM | POA: Diagnosis not present

## 2013-11-01 ENCOUNTER — Other Ambulatory Visit: Payer: Self-pay | Admitting: Cardiovascular Disease

## 2013-11-01 ENCOUNTER — Telehealth: Payer: Self-pay | Admitting: *Deleted

## 2013-11-01 NOTE — Telephone Encounter (Signed)
PA form filled out for Estropipate 1.5 mg filled out and faxed back to [facility].

## 2013-11-04 ENCOUNTER — Encounter: Payer: Self-pay | Admitting: Interventional Cardiology

## 2013-11-05 ENCOUNTER — Other Ambulatory Visit: Payer: Self-pay | Admitting: Cardiology

## 2013-11-15 NOTE — Telephone Encounter (Signed)
Estropipate 1.5 was approved by [facility].

## 2013-12-01 ENCOUNTER — Other Ambulatory Visit: Payer: Self-pay | Admitting: Cardiovascular Disease

## 2013-12-10 DIAGNOSIS — I10 Essential (primary) hypertension: Secondary | ICD-10-CM | POA: Diagnosis not present

## 2013-12-10 DIAGNOSIS — E119 Type 2 diabetes mellitus without complications: Secondary | ICD-10-CM | POA: Diagnosis not present

## 2013-12-17 DIAGNOSIS — H26499 Other secondary cataract, unspecified eye: Secondary | ICD-10-CM | POA: Diagnosis not present

## 2013-12-17 DIAGNOSIS — E119 Type 2 diabetes mellitus without complications: Secondary | ICD-10-CM | POA: Diagnosis not present

## 2013-12-30 ENCOUNTER — Other Ambulatory Visit: Payer: Self-pay | Admitting: Cardiovascular Disease

## 2014-01-23 ENCOUNTER — Other Ambulatory Visit: Payer: Self-pay | Admitting: Cardiovascular Disease

## 2014-02-03 ENCOUNTER — Ambulatory Visit (HOSPITAL_COMMUNITY): Payer: Medicare Other | Admitting: Cardiology

## 2014-02-03 ENCOUNTER — Other Ambulatory Visit (HOSPITAL_COMMUNITY): Payer: Self-pay | Admitting: Cardiology

## 2014-02-03 ENCOUNTER — Encounter (HOSPITAL_COMMUNITY): Payer: Medicare Other

## 2014-02-03 ENCOUNTER — Ambulatory Visit (HOSPITAL_COMMUNITY): Payer: Medicare Other | Attending: Cardiovascular Disease | Admitting: Cardiology

## 2014-02-03 DIAGNOSIS — E785 Hyperlipidemia, unspecified: Secondary | ICD-10-CM | POA: Insufficient documentation

## 2014-02-03 DIAGNOSIS — I739 Peripheral vascular disease, unspecified: Secondary | ICD-10-CM

## 2014-02-03 DIAGNOSIS — Z09 Encounter for follow-up examination after completed treatment for conditions other than malignant neoplasm: Secondary | ICD-10-CM | POA: Diagnosis not present

## 2014-02-03 DIAGNOSIS — I509 Heart failure, unspecified: Secondary | ICD-10-CM | POA: Insufficient documentation

## 2014-02-03 DIAGNOSIS — E119 Type 2 diabetes mellitus without complications: Secondary | ICD-10-CM | POA: Diagnosis not present

## 2014-02-03 DIAGNOSIS — Z87891 Personal history of nicotine dependence: Secondary | ICD-10-CM | POA: Insufficient documentation

## 2014-02-03 DIAGNOSIS — I70219 Atherosclerosis of native arteries of extremities with intermittent claudication, unspecified extremity: Secondary | ICD-10-CM

## 2014-02-03 DIAGNOSIS — I10 Essential (primary) hypertension: Secondary | ICD-10-CM | POA: Insufficient documentation

## 2014-02-03 DIAGNOSIS — I2510 Atherosclerotic heart disease of native coronary artery without angina pectoris: Secondary | ICD-10-CM | POA: Diagnosis not present

## 2014-02-03 NOTE — Progress Notes (Signed)
ABI performed  

## 2014-02-03 NOTE — Progress Notes (Signed)
Left LEA Duplex performed

## 2014-02-04 ENCOUNTER — Other Ambulatory Visit (HOSPITAL_COMMUNITY): Payer: Self-pay | Admitting: *Deleted

## 2014-02-04 DIAGNOSIS — I739 Peripheral vascular disease, unspecified: Secondary | ICD-10-CM

## 2014-02-05 ENCOUNTER — Ambulatory Visit (HOSPITAL_COMMUNITY): Payer: Medicare Other | Attending: Cardiology | Admitting: *Deleted

## 2014-02-05 DIAGNOSIS — I700 Atherosclerosis of aorta: Secondary | ICD-10-CM

## 2014-02-05 DIAGNOSIS — I10 Essential (primary) hypertension: Secondary | ICD-10-CM | POA: Diagnosis not present

## 2014-02-05 DIAGNOSIS — Z87891 Personal history of nicotine dependence: Secondary | ICD-10-CM | POA: Diagnosis not present

## 2014-02-05 DIAGNOSIS — I739 Peripheral vascular disease, unspecified: Secondary | ICD-10-CM | POA: Insufficient documentation

## 2014-02-05 DIAGNOSIS — E785 Hyperlipidemia, unspecified: Secondary | ICD-10-CM | POA: Diagnosis not present

## 2014-02-05 DIAGNOSIS — E119 Type 2 diabetes mellitus without complications: Secondary | ICD-10-CM | POA: Diagnosis not present

## 2014-02-05 DIAGNOSIS — I708 Atherosclerosis of other arteries: Secondary | ICD-10-CM | POA: Diagnosis not present

## 2014-02-05 DIAGNOSIS — I509 Heart failure, unspecified: Secondary | ICD-10-CM | POA: Diagnosis not present

## 2014-02-05 DIAGNOSIS — I2510 Atherosclerotic heart disease of native coronary artery without angina pectoris: Secondary | ICD-10-CM | POA: Insufficient documentation

## 2014-02-05 DIAGNOSIS — Z9889 Other specified postprocedural states: Secondary | ICD-10-CM | POA: Diagnosis not present

## 2014-02-05 NOTE — Progress Notes (Signed)
Abdominal aorta duplex complete 

## 2014-02-07 ENCOUNTER — Ambulatory Visit (INDEPENDENT_AMBULATORY_CARE_PROVIDER_SITE_OTHER): Payer: Medicare Other | Admitting: Cardiology

## 2014-02-07 ENCOUNTER — Encounter: Payer: Self-pay | Admitting: Cardiology

## 2014-02-07 ENCOUNTER — Ambulatory Visit (HOSPITAL_COMMUNITY): Payer: Medicare Other | Attending: Cardiovascular Disease | Admitting: Cardiology

## 2014-02-07 VITALS — BP 114/52 | HR 52 | Ht 64.0 in | Wt 148.0 lb

## 2014-02-07 DIAGNOSIS — I739 Peripheral vascular disease, unspecified: Secondary | ICD-10-CM | POA: Insufficient documentation

## 2014-02-07 DIAGNOSIS — I10 Essential (primary) hypertension: Secondary | ICD-10-CM | POA: Diagnosis not present

## 2014-02-07 DIAGNOSIS — E119 Type 2 diabetes mellitus without complications: Secondary | ICD-10-CM | POA: Insufficient documentation

## 2014-02-07 DIAGNOSIS — E785 Hyperlipidemia, unspecified: Secondary | ICD-10-CM

## 2014-02-07 DIAGNOSIS — N289 Disorder of kidney and ureter, unspecified: Secondary | ICD-10-CM

## 2014-02-07 DIAGNOSIS — I70219 Atherosclerosis of native arteries of extremities with intermittent claudication, unspecified extremity: Secondary | ICD-10-CM

## 2014-02-07 DIAGNOSIS — Z87891 Personal history of nicotine dependence: Secondary | ICD-10-CM | POA: Insufficient documentation

## 2014-02-07 DIAGNOSIS — I509 Heart failure, unspecified: Secondary | ICD-10-CM | POA: Insufficient documentation

## 2014-02-07 DIAGNOSIS — I2510 Atherosclerotic heart disease of native coronary artery without angina pectoris: Secondary | ICD-10-CM | POA: Diagnosis not present

## 2014-02-07 LAB — BASIC METABOLIC PANEL
BUN: 45 mg/dL — AB (ref 6–23)
CHLORIDE: 107 meq/L (ref 96–112)
CO2: 21 meq/L (ref 19–32)
Calcium: 9.6 mg/dL (ref 8.4–10.5)
Creatinine, Ser: 1.8 mg/dL — ABNORMAL HIGH (ref 0.4–1.2)
GFR: 28.88 mL/min — ABNORMAL LOW (ref >60.00)
Glucose, Bld: 117 mg/dL — ABNORMAL HIGH (ref 70–99)
Sodium: 136 mEq/L (ref 135–145)

## 2014-02-07 NOTE — Patient Instructions (Signed)
Your physician recommends that you continue on your current medications as directed. Please refer to the Current Medication list given to you today.  Your physician recommends that you go to the lab today for a BMET  Your physician has requested that you have a lower Right extremity arterial duplex. This test is an ultrasound of the arteries in the legs. It looks at arterial blood flow in the legs. Allow one hour for Lower Arterial scans. There are no restrictions or special instructions  Your physician wants you to follow-up in: 6 months with Dr Kendall Flack will receive a reminder letter in the mail two months in advance. If you don't receive a letter, please call our office to schedule the follow-up appointment.

## 2014-02-07 NOTE — Progress Notes (Signed)
Right lower extremity arterial duplex completed. 

## 2014-02-09 NOTE — Progress Notes (Signed)
Patient ID: Doris Carr, female   DOB: 1936/05/12, 78 y.o.   MRN: [id number] PCP: Dr. Harrington Challenger  78 yo with history of CKD, diabetes, chronic LBBB, PAD, and difficult to control HTN presents for cardiology followup.  Blood pressure has been much better since adding spironolactone.  She had a lower extremity angiogram in 2/13 with PTA and stenting of the left SFA and right CIA.  Renal arteries had no significant stenosis.  She continues to lose weight.  No exertional dyspnea, no chest pain.  She has been tried on multiple cholesterol meds and has not tolerated any.    For the last few months, she has had right calf pain.  This will tend to be brought on by walking around the block.  It has been gradually worse over time.  She had aorto-iliac dopplers and left leg dopplers recently, but unfortunately did not have evaluation of her right leg (where she has been having the symptoms).    ECG: NSR, LBBB (chronic)  Labs (7/12): HgbA1c 5.9, K 3.8, creatinine 1.49, BUN 47 Labs (8/12): BNP 108, K 3.7, creatinine 1.3 => 1.4 Labs (12/12): K 4.5, creatinine 1.4 Labs (2/13): K 4.7, creatinine 0.96 Labs (6/13): K 4.7, creatinine 1.5, LDL 169 Labs (11/14): LDL 154 Labs (12/14): K 4.5, creatinine 1.5  PMH: 1.  HTN: This has been difficult to control.  She has had renal artery doppler evaluation on two different occasions, both times with no evidence for renal artery stenosis.  She has had urinary catecholeamine collection that was unremarkable.  Edema with 10 mg amlodipine.  Renal artery angiogram in 2/13 showed no renal artery stenosis.  2. Diabetes mellitus type II: improved, no longer on medications.  3. CKD: Likely due to diabetes and HTN. 4. Chronic LBBB 5. Lung nodule with VATS and biopsy in 2006 that was negative for malignancy.  6. Depression 7. Anxiety 8. Obesity 9. Left heart cath in 6/06 with normal coronaries, EF 60%.  10.  Back pain: Sees a neurosurgeon.  11.  Hyperlipidemia: Myalgias with statins.   12.  Diastolic CHF: Echo (1/44) with EF 60-65%, moderate LV hypertrophy, mild LVH. 13.  PAD: Normal [facility]Is at [facility] in [facility]11.  Lower extremity evaluation (12/12): ABI 0.79 right, 0.64 left; > 50% mid left SFA stenosis.  Peripheral angiogram in 2/13 with PTA and stenting of the left SFA and right CIA.  ABIs (5/14) 1.0 right/1.2 L; dopplers showed patent left SFA stent and moderate left CIA stenosis. ABIs (5/15) 0.88 right/0.96 left, left leg dopplers showed patent left SFA stent and aortoiliac dopplers showed patent right CIA stent and mild left CIA stenosis.  Unfortunately, right leg dopplers not done.   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  Nonsmoker.    FH: Mother with pacemaker and Alzheimer's-type dementia.   ROS: All systems reviewed and negative except as per HPI.    Current Outpatient Prescriptions  Medication Sig Dispense Refill  . amLODipine (NORVASC) 5 MG tablet Take 1 tablet (5 mg total) by mouth daily.  30 tablet  11  . aspirin EC 81 MG tablet Take 1 tablet (81 mg total) by mouth daily.      . benzonatate (TESSALON) 200 MG capsule Take 200 mg by mouth 2 (two) times daily as needed. For cough      . carvedilol (COREG) 12.5 MG tablet Take 25 mg by mouth 2 (two) times daily.        . chlorthalidone (HYGROTON) 25 MG tablet TAKE  1/2 TABLET BY MOUTH ONCE DAILY  30 tablet  3  . cimetidine (TAGAMET) 400 MG tablet Take 400 mg by mouth 4 (four) times daily as needed. For heartburn      . citalopram (CELEXA) 20 MG tablet Take 20 mg by mouth daily.        . clonazePAM (KLONOPIN) 0.5 MG tablet Take 0.5 mg by mouth 2 (two) times daily as needed. 0.5 tab in the am, 1 tab in the pm      . clopidogrel (PLAVIX) 75 MG tablet TAKE 1 TABLET BY MOUTH EVERY DAY  30 tablet  0  . Estropipate (OGEN 1.25 PO) Take by mouth.      . estropipate (OGEN) 1.5 MG tablet Take one by mouth in the morning  30 tablet  12  . fluconazole (DIFLUCAN) 150 MG tablet Take 1 tablet (150 mg total) by mouth  once. As needed for yeast  10 tablet  0  . L-Methylfolate-B6-B12 (METANX) 3-35-2 MG TABS Take 1 tablet by mouth 2 (two) times daily.        Marland Kitchen losartan (COZAAR) 100 MG tablet Take 0.5 tablets (50 mg total) by mouth 2 (two) times daily.  30 tablet  11  . metFORMIN (GLUCOPHAGE-XR) 500 MG 24 hr tablet Take 500 mg by mouth daily as needed.       Marland Kitchen spironolactone (ALDACTONE) 25 MG tablet TAKE 1 TABLET BY MOUTH EVERY DAY  30 tablet  6  . terbinafine (LAMISIL) 250 MG tablet Take 250 mg by mouth daily.      . traZODone (DESYREL) 50 MG tablet Take 50 mg by mouth at bedtime. HALF A PILL AT BEDTIME       No current facility-administered medications for this visit.    BP 114/52  Pulse 52  Ht 5\' 4"  (1.626 m)  Wt 67.132 kg (148 lb)  BMI 25.39 kg/m2 General: NAD, overweight Neck: No JVD, no thyromegaly or thyroid nodule.  Lungs: Clear to auscultation bilaterally with normal respiratory effort. CV: Nondisplaced PMI.  Heart regular S1/S2, soft S4, no murmur.  No edema.  No carotid bruit.  1+ left PT pulse, unable to palpate right PT, no foot ulcerations. Bilateral lower leg venous varicosities.  Abdomen: Soft, nontender, no hepatosplenomegaly, no distention.   Neurologic: Alert and oriented x 3.  Psych: Normal affect. Extremities: No clubbing or cyanosis.   Assessment/Plan: 1. HTN: BP better on spironolactone (suspect aldosterone-related HTN) and with weight loss. Needs BMET today.  Should have BMET every 3 months with spironolactone use and CKD. 2. Hyperlipidemia: Has PAD but unable to tolerate any statin, Zetia, or Niacin.  She will likely be a PCSK9 inhibitor candidate when these meds become commercially available.  3. PAD: Worsening right leg claudication.  Unfortunately, recent peripheral evaluation did not assess right leg (only left leg and iliacs).  I will arrange for doppler assessment of right leg.  If this is suggestive of significant disease (I suspect it will be), she will need to see Dr.  Burt Knack again.    Larey Dresser 02/09/2014

## 2014-02-11 ENCOUNTER — Other Ambulatory Visit (INDEPENDENT_AMBULATORY_CARE_PROVIDER_SITE_OTHER): Payer: Medicare Other

## 2014-02-11 ENCOUNTER — Telehealth: Payer: Self-pay | Admitting: Cardiology

## 2014-02-11 ENCOUNTER — Other Ambulatory Visit: Payer: Self-pay

## 2014-02-11 DIAGNOSIS — E875 Hyperkalemia: Secondary | ICD-10-CM | POA: Diagnosis not present

## 2014-02-11 LAB — BASIC METABOLIC PANEL
BUN: 32 mg/dL — AB (ref 6–23)
CALCIUM: 9.5 mg/dL (ref 8.4–10.5)
CO2: 26 meq/L (ref 19–32)
Chloride: 106 mEq/L (ref 96–112)
Creatinine, Ser: 1.5 mg/dL — ABNORMAL HIGH (ref 0.4–1.2)
GFR: 35.32 mL/min — ABNORMAL LOW (ref >60.00)
GLUCOSE: 127 mg/dL — AB (ref 70–99)
Potassium: 5.5 mEq/L — ABNORMAL HIGH (ref 3.5–5.1)
SODIUM: 137 meq/L (ref 135–145)

## 2014-02-11 NOTE — Telephone Encounter (Signed)
New message ° ° ° ° ° °Returning a nurses call °

## 2014-02-11 NOTE — Telephone Encounter (Signed)
Received call back from patient lab results and lower ext doppler results given.Bmet to be repeated today.Lower ext doppler report sent to Dr.Cooper for review.

## 2014-02-17 ENCOUNTER — Other Ambulatory Visit: Payer: Self-pay | Admitting: Cardiovascular Disease

## 2014-02-17 DIAGNOSIS — I739 Peripheral vascular disease, unspecified: Secondary | ICD-10-CM | POA: Diagnosis not present

## 2014-02-17 DIAGNOSIS — F411 Generalized anxiety disorder: Secondary | ICD-10-CM | POA: Diagnosis not present

## 2014-02-18 ENCOUNTER — Ambulatory Visit (INDEPENDENT_AMBULATORY_CARE_PROVIDER_SITE_OTHER): Payer: Medicare Other | Admitting: *Deleted

## 2014-02-18 DIAGNOSIS — I5032 Chronic diastolic (congestive) heart failure: Secondary | ICD-10-CM | POA: Diagnosis not present

## 2014-02-18 DIAGNOSIS — I509 Heart failure, unspecified: Secondary | ICD-10-CM

## 2014-02-18 LAB — BASIC METABOLIC PANEL
BUN: 29 mg/dL — AB (ref 6–23)
CALCIUM: 9.2 mg/dL (ref 8.4–10.5)
CHLORIDE: 105 meq/L (ref 96–112)
CO2: 25 meq/L (ref 19–32)
CREATININE: 1.4 mg/dL — AB (ref 0.4–1.2)
GFR: 40.5 mL/min — ABNORMAL LOW (ref >60.00)
Glucose, Bld: 141 mg/dL — ABNORMAL HIGH (ref 70–99)
Potassium: 4.4 mEq/L (ref 3.5–5.1)
Sodium: 137 mEq/L (ref 135–145)

## 2014-03-01 ENCOUNTER — Other Ambulatory Visit: Payer: Self-pay | Admitting: Cardiovascular Disease

## 2014-03-20 ENCOUNTER — Encounter: Payer: Self-pay | Admitting: Cardiovascular Disease

## 2014-03-20 ENCOUNTER — Ambulatory Visit (INDEPENDENT_AMBULATORY_CARE_PROVIDER_SITE_OTHER): Payer: Medicare Other | Admitting: Cardiovascular Disease

## 2014-03-20 VITALS — BP 118/58 | HR 62 | Ht 64.0 in | Wt 150.0 lb

## 2014-03-20 DIAGNOSIS — I70219 Atherosclerosis of native arteries of extremities with intermittent claudication, unspecified extremity: Secondary | ICD-10-CM

## 2014-03-20 NOTE — Progress Notes (Signed)
HPI:  78 year old woman presenting for followup evaluation. The patient is followed for lower extremity peripheral arterial disease. She has undergone right common iliac and left SFA stenting in the past. She has multiple other medical problems and her cardiac disease is followed by Dr. Aundra Dubin. The patient is statin intolerant. At the time of her evaluation last month, she was noted to have progressive right leg claudication symptoms. She was sent for ABIs and Doppler studies. Her ABIs were 0.88 on the right and 0.96 on the left. Doppler studies revealed patency of the left SFA stent, severe stenosis in the mid left anterior tibial artery with three-vessel runoff, wide patency of the right common iliac stent, and greater than 50% stenosis of the right superficial femoral artery. There was some suggestion of inflow disease based on waveform analysis on the right.  The patient is having a lot of problems with her right leg. She describes the pain in the thigh and upper leg with certain movements, such as raising the right leg. She has pain with getting into her car. She is not able to do any prolonged walking. She's having no problems on the left side. She denies calf pain, rest pain, and has not had any ulcerations.  Outpatient Encounter Prescriptions as of 03/20/2014  Medication Sig  . amLODipine (NORVASC) 5 MG tablet Take 1 tablet (5 mg total) by mouth daily.  Marland Kitchen aspirin EC 81 MG tablet Take 1 tablet (81 mg total) by mouth daily.  . benzonatate (TESSALON) 200 MG capsule Take 200 mg by mouth 2 (two) times daily as needed. For cough  . carvedilol (COREG) 12.5 MG tablet Take 25 mg by mouth 2 (two) times daily.    . chlorthalidone (HYGROTON) 25 MG tablet TAKE 1/2 TABLET BY MOUTH ONCE DAILY  . cimetidine (TAGAMET) 400 MG tablet Take 400 mg by mouth 4 (four) times daily as needed. For heartburn  . citalopram (CELEXA) 20 MG tablet Take 20 mg by mouth daily.    . clonazePAM (KLONOPIN) 0.5 MG tablet Take  0.5 mg by mouth 2 (two) times daily as needed. 0.5 tab in the am, 1 tab in the pm  . clopidogrel (PLAVIX) 75 MG tablet TAKE 1 TABLET BY MOUTH EVERY DAY  . Estropipate (OGEN 1.25 PO) Take by mouth.  . estropipate (OGEN) 1.5 MG tablet Take one by mouth in the morning  . fluconazole (DIFLUCAN) 150 MG tablet Take 1 tablet (150 mg total) by mouth once. As needed for yeast  . L-Methylfolate-B6-B12 (METANX) 3-35-2 MG TABS Take 1 tablet by mouth 2 (two) times daily.    Marland Kitchen losartan (COZAAR) 100 MG tablet Take 0.5 tablets (50 mg total) by mouth 2 (two) times daily.  . metFORMIN (GLUCOPHAGE-XR) 500 MG 24 hr tablet Take 500 mg by mouth daily as needed.   . terbinafine (LAMISIL) 250 MG tablet Take 250 mg by mouth daily.  . traZODone (DESYREL) 50 MG tablet Take 50 mg by mouth at bedtime. HALF A PILL AT BEDTIME  . [DISCONTINUED] spironolactone (ALDACTONE) 25 MG tablet TAKE 1 TABLET BY MOUTH EVERY DAY    Allergies  Allergen Reactions  . Ciprofloxacin   . Codeine   . Darvocet [Propoxyphene N-Acetaminophen]   . Darvon   . Hydralazine   . Hydrocodone Nausea And Vomiting  . Morphine And Related   . Pentazocine Lactate   . Potassium-Containing Compounds   . Procaine Hcl   . Sulfa Drugs Cross Reactors     Past Medical History  Diagnosis Date  . Balance problems   . Syncope and collapse   . Left bundle branch block   . CKD (chronic kidney disease)     likely due to DM2 and HTN  . Lower extremity edema     CHRONIC  . Depression   . Anxiety   . Obesity   . 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 Hypertension     This has been difficult to control. She has had renal artery doppler evaluation on two different occasions, both times with no evidence for renal artery stenosis. She has had urinary catecholeamine collection that was unremarkable. Edema with 10 mg amlodipine.   . Peripheral vascular disease     Lower extremity evaluation (12/12): ABI 0.79 right, 0.64 left; > 50%  mid left SFA stenosis;  s/p L SFA stent and stent to R CIA 11/2011; ABI's post PTA - R 0.90, L 0.96 (TBI on R 0.51, L 0.61)  . GERD (gastroesophageal reflux disease)   . Diabetes mellitus     type  2  . Lung nodule     VATS and biopsy in 2006 that was negative for malignancy  . S/P cardiac catheterization     Left heart cath in 6/06 with normal coronaries, EF 60%.  . Back pain     sees a neurosurgeon  . HLD (hyperlipidemia)     myalgias with statins  . Chronic diastolic heart failure     Echo (8/12) with EF 60-65%, moderate LV hypertrophy, mild LVH    ROS: Negative except as per HPI  BP 118/58  Pulse 62  Ht 5\' 4"  (1.626 m)  Wt 68.04 kg (150 lb)  BMI 25.73 kg/m2  PHYSICAL EXAM: Pt is alert and oriented, NAD HEENT: normal Lungs: CTA bilaterally CV: RRR without murmur or gallop Abd: soft, NT Ext: no C/C/E, distal pulses intact and equal Skin: warm/dry no rash  ASSESSMENT AND PLAN: 78 year old woman with lower extremity PAD. The patient's symptoms are atypical for claudication or vascular related pain. She has 2+ DP and PT pulses bilaterally and her ABIs are 0.88 on the right and 0.96 on the left. In spite of the findings of occlusive disease in the right distal SFA, I do not think PTA and stenting would provide her any symptomatic benefit. I recommended orthopedic evaluation as I think her pain is musculoskeletal. I would like to see her back in one year with repeat ABIs and Doppler studies. She will continue on her current medical regime which includes aspirin, Plavix, and a beta blocker. She is intolerant to statin drugs. Sherren Mocha 03/20/2014 2:41 PM

## 2014-03-20 NOTE — Patient Instructions (Signed)
Your physician has requested that you have a lower extremity arterial duplex in 1 YEAR. This test is an ultrasound of the arteries in the legs. It looks at arterial blood flow in the legs. Allow one hour for Lower Arterial scans. There are no restrictions or special instructions  Your physician has requested that you have an ankle brachial index (ABI) in 1 YEAR. During this test an ultrasound and blood pressure cuff are used to evaluate the arteries that supply the arms and legs with blood. Allow thirty minutes for this exam. There are no restrictions or special instructions.  Your physician wants you to follow-up in: 1 YEAR with Dr Burt Knack.  You will receive a reminder letter in the mail two months in advance. If you don't receive a letter, please call our office to schedule the follow-up appointment.  Your physician recommends that you continue on your current medications as directed. Please refer to the Current Medication list given to you today.

## 2014-03-25 DIAGNOSIS — M76899 Other specified enthesopathies of unspecified lower limb, excluding foot: Secondary | ICD-10-CM | POA: Diagnosis not present

## 2014-03-30 ENCOUNTER — Other Ambulatory Visit: Payer: Self-pay | Admitting: Cardiovascular Disease

## 2014-03-31 ENCOUNTER — Encounter (HOSPITAL_COMMUNITY): Payer: Self-pay | Admitting: Cardiovascular Disease

## 2014-04-01 DIAGNOSIS — M76899 Other specified enthesopathies of unspecified lower limb, excluding foot: Secondary | ICD-10-CM | POA: Diagnosis not present

## 2014-04-15 ENCOUNTER — Ambulatory Visit: Payer: Medicare Other | Admitting: Cardiovascular Disease

## 2014-04-17 DIAGNOSIS — Z1231 Encounter for screening mammogram for malignant neoplasm of breast: Secondary | ICD-10-CM | POA: Diagnosis not present

## 2014-04-18 ENCOUNTER — Encounter: Payer: Self-pay | Admitting: Gynecology

## 2014-04-29 ENCOUNTER — Encounter: Payer: Self-pay | Admitting: Gynecology

## 2014-04-29 ENCOUNTER — Ambulatory Visit (INDEPENDENT_AMBULATORY_CARE_PROVIDER_SITE_OTHER): Payer: Medicare Other | Admitting: Gynecology

## 2014-04-29 VITALS — BP 114/66 | Ht 64.0 in | Wt 149.0 lb

## 2014-04-29 DIAGNOSIS — N952 Postmenopausal atrophic vaginitis: Secondary | ICD-10-CM | POA: Diagnosis not present

## 2014-04-29 DIAGNOSIS — Z79890 Hormone replacement therapy (postmenopausal): Secondary | ICD-10-CM

## 2014-04-29 DIAGNOSIS — N816 Rectocele: Secondary | ICD-10-CM

## 2014-04-29 DIAGNOSIS — I70219 Atherosclerosis of native arteries of extremities with intermittent claudication, unspecified extremity: Secondary | ICD-10-CM | POA: Diagnosis not present

## 2014-04-29 MED ORDER — ESTROPIPATE 1.5 MG PO TABS: 1.5000 mg | ORAL_TABLET | Freq: Every day | ORAL | Status: DC

## 2014-04-29 NOTE — Progress Notes (Signed)
Doris Carr July 28, 1936 [id number]        78 y.o.  G3P3003 for followup exam. Several issues noted below.  Past medical history,surgical history, problem list, medications, allergies, family history and social history were all reviewed and documented as reviewed in the EPIC chart.  ROS:  12 system ROS performed with pertinent positives and negatives included in the history, assessment and plan.   Additional significant findings :  None   Exam: Kim Counsellor Vitals:   04/29/14 1147  BP: 114/66  Height: 5\' 4"  (1.626 m)  Weight: 149 lb (67.586 kg)   General appearance:  Normal affect, orientation and appearance. Skin: Grossly normal HEENT: Without gross lesions.  No cervical or supraclavicular adenopathy. Thyroid normal.  Lungs:  Clear without wheezing, rales or rhonchi Cardiac: RR, without RMG Abdominal:  Soft, nontender, without masses, guarding, rebound, organomegaly or hernia Breasts:  Examined lying and sitting without masses, retractions, discharge or axillary adenopathy. Pelvic:  Ext/BUS/vagina with generalized atrophic changes. First to second degree rectocele. Cuff well supported. No significant cystocele.  Adnexa  Without masses or tenderness    Anus and perineum  Normal   Rectovaginal  Normal sphincter tone without palpated masses or tenderness.    Assessment/Plan:  78 y.o. G3P3003 female for followup exam.   1. HRT. Patient continues on Ogen 1.25 mg. We previously discussed risks benefits and I can review them with her in particular the thrombosis risk given her history of peripheral vascular disease. The realistic increased risk of stroke heart attack DVT reviewed. Also discussed breast cancer possibilities. Patient has tried weaning in the past with unacceptable hot flushes and night sweats. I again asked her to try to wean this year and she's going to bring her pill in half and take half a pill daily through the fall and then try to stop and see how she does. I did  refill her x1 year. She clearly understands the issues and risks at this point feels it is a quality of life issue and wants to continue but at a lower dose and then try to wean herself off. 2. Rectocele. Stable on exam. Having issues with rectal incontinence and saw Dr. Marcello Moores for evaluation. She started on Metamucil and apparently is doing better with this and is observing it. From the rectocele standpoint she is not symptomatic and we'll continue to observe. 3. Pap smear 2011. No Pap smear done today. History of vaginal hysterectomy 1985. Did have a history of high-grade dysplasia proceeding this status post cryosurgery. Her Pap smears have all been negative after that we both agree on stop screening per current screening guidelines and she is comfortable with this. 4. Mammography 04/2014. Continued annual mammography. SBE monthly review. 5. Colonoscopy. Patient has adamantly refused colonoscopy in the past. She did have a sigmoidoscopy this past year. She again refuses colonoscopy. 6. DEXA normal 2010. Repeat now at five-year interval. Patient will schedule. Increase calcium vitamin D reviewed. 7. Health maintenance. No routine blood work done as this is done through her other physician's offices. Followup bone density otherwise one year, sooner as needed.   Note: This document was prepared with digital dictation and possible smart phrase technology. Any transcriptional errors that result from this process are unintentional.   Anastasio Auerbach MD, 12:41 PM 04/29/2014

## 2014-04-29 NOTE — Patient Instructions (Signed)
Try to decrease and wean your self off of the hormone replacement. Call me if you have any issues.  You may obtain a copy of any labs that were done today by logging onto MyChart as outlined in the instructions provided with your AVS (after visit summary). The office will not call with normal lab results but certainly if there are any significant abnormalities then we will contact you.   Health Maintenance, Female A healthy lifestyle and preventative care can promote health and wellness.  Maintain regular health, dental, and eye exams.  Eat a healthy diet. Foods like vegetables, fruits, whole grains, low-fat dairy products, and lean protein foods contain the nutrients you need without too many calories. Decrease your intake of foods high in solid fats, added sugars, and salt. Get information about a proper diet from your caregiver, if necessary.  Regular physical exercise is one of the most important things you can do for your health. Most adults should get at least 150 minutes of moderate-intensity exercise (any activity that increases your heart rate and causes you to sweat) each week. In addition, most adults need muscle-strengthening exercises on 2 or more days a week.   Maintain a healthy weight. The body mass index (BMI) is a screening tool to identify possible weight problems. It provides an estimate of body fat based on height and weight. Your caregiver can help determine your BMI, and can help you achieve or maintain a healthy weight. For adults 20 years and older:  A BMI below 18.5 is considered underweight.  A BMI of 18.5 to 24.9 is normal.  A BMI of 25 to 29.9 is considered overweight.  A BMI of 30 and above is considered obese.  Maintain normal blood lipids and cholesterol by exercising and minimizing your intake of saturated fat. Eat a balanced diet with plenty of fruits and vegetables. Blood tests for lipids and cholesterol should begin at age 64 and be repeated every 5 years.  If your lipid or cholesterol levels are high, you are over 50, or you are a high risk for heart disease, you may need your cholesterol levels checked more frequently.Ongoing high lipid and cholesterol levels should be treated with medicines if diet and exercise are not effective.  If you smoke, find out from your caregiver how to quit. If you do not use tobacco, do not start.  Lung cancer screening is recommended for adults aged 40 80 years who are at high risk for developing lung cancer because of a history of smoking. Yearly low-dose computed tomography (CT) is recommended for people who have at least a 30-pack-year history of smoking and are a current smoker or have quit within the past 15 years. A pack year of smoking is smoking an average of 1 pack of cigarettes a day for 1 year (for example: 1 pack a day for 30 years or 2 packs a day for 15 years). Yearly screening should continue until the smoker has stopped smoking for at least 15 years. Yearly screening should also be stopped for people who develop a health problem that would prevent them from having lung cancer treatment.  If you are pregnant, do not drink alcohol. If you are breastfeeding, be very cautious about drinking alcohol. If you are not pregnant and choose to drink alcohol, do not exceed 1 drink per day. One drink is considered to be 12 ounces (355 mL) of beer, 5 ounces (148 mL) of wine, or 1.5 ounces (44 mL) of liquor.  Avoid use  of street drugs. Do not share needles with anyone. Ask for help if you need support or instructions about stopping the use of drugs.  High blood pressure causes heart disease and increases the risk of stroke. Blood pressure should be checked at least every 1 to 2 years. Ongoing high blood pressure should be treated with medicines, if weight loss and exercise are not effective.  If you are 8 to 76 years old, ask your caregiver if you should take aspirin to prevent strokes.  Diabetes screening involves  taking a blood sample to check your fasting blood sugar level. This should be done once every 3 years, after age 2, if you are within normal weight and without risk factors for diabetes. Testing should be considered at a younger age or be carried out more frequently if you are overweight and have at least 1 risk factor for diabetes.  Breast cancer screening is essential preventative care for women. You should practice "breast self-awareness." This means understanding the normal appearance and feel of your breasts and may include breast self-examination. Any changes detected, no matter how small, should be reported to a caregiver. Women in their 1s and 30s should have a clinical breast exam (CBE) by a caregiver as part of a regular health exam every 1 to 3 years. After age 45, women should have a CBE every year. Starting at age 37, women should consider having a mammogram (breast X-ray) every year. Women who have a family history of breast cancer should talk to their caregiver about genetic screening. Women at a high risk of breast cancer should talk to their caregiver about having an MRI and a mammogram every year.  Breast cancer gene (BRCA)-related cancer risk assessment is recommended for women who have family members with BRCA-related cancers. BRCA-related cancers include breast, ovarian, tubal, and peritoneal cancers. Having family members with these cancers may be associated with an increased risk for harmful changes (mutations) in the breast cancer genes BRCA1 and BRCA2. Results of the assessment will determine the need for genetic counseling and BRCA1 and BRCA2 testing.  The Pap test is a screening test for cervical cancer. Women should have a Pap test starting at age 40. Between ages 88 and 34, Pap tests should be repeated every 2 years. Beginning at age 32, you should have a Pap test every 3 years as long as the past 3 Pap tests have been normal. If you had a hysterectomy for a problem that was not  cancer or a condition that could lead to cancer, then you no longer need Pap tests. If you are between ages 23 and 46, and you have had normal Pap tests going back 10 years, you no longer need Pap tests. If you have had past treatment for cervical cancer or a condition that could lead to cancer, you need Pap tests and screening for cancer for at least 20 years after your treatment. If Pap tests have been discontinued, risk factors (such as a new sexual partner) need to be reassessed to determine if screening should be resumed. Some women have medical problems that increase the chance of getting cervical cancer. In these cases, your caregiver may recommend more frequent screening and Pap tests.  The human papillomavirus (HPV) test is an additional test that may be used for cervical cancer screening. The HPV test looks for the virus that can cause the cell changes on the cervix. The cells collected during the Pap test can be tested for HPV. The HPV test  could be used to screen women aged 61 years and older, and should be used in women of any age who have unclear Pap test results. After the age of 29, women should have HPV testing at the same frequency as a Pap test.  Colorectal cancer can be detected and often prevented. Most routine colorectal cancer screening begins at the age of 17 and continues through age 35. However, your caregiver may recommend screening at an earlier age if you have risk factors for colon cancer. On a yearly basis, your caregiver may provide home test kits to check for hidden blood in the stool. Use of a small camera at the end of a tube, to directly examine the colon (sigmoidoscopy or colonoscopy), can detect the earliest forms of colorectal cancer. Talk to your caregiver about this at age 54, when routine screening begins. Direct examination of the colon should be repeated every 5 to 10 years through age 50, unless early forms of pre-cancerous polyps or small growths are  found.  Hepatitis C blood testing is recommended for all people born from [birth year] through 1965 and any individual with known risks for hepatitis C.  Practice safe sex. Use condoms and avoid high-risk sexual practices to reduce the spread of sexually transmitted infections (STIs). Sexually active women aged 15 and younger should be checked for Chlamydia, which is a common sexually transmitted infection. Older women with new or multiple partners should also be tested for Chlamydia. Testing for other STIs is recommended if you are sexually active and at increased risk.  Osteoporosis is a disease in which the bones lose minerals and strength with aging. This can result in serious bone fractures. The risk of osteoporosis can be identified using a bone density scan. Women ages 72 and over and women at risk for fractures or osteoporosis should discuss screening with their caregivers. Ask your caregiver whether you should be taking a calcium supplement or vitamin D to reduce the rate of osteoporosis.  Menopause can be associated with physical symptoms and risks. Hormone replacement therapy is available to decrease symptoms and risks. You should talk to your caregiver about whether hormone replacement therapy is right for you.  Use sunscreen. Apply sunscreen liberally and repeatedly throughout the day. You should seek shade when your shadow is shorter than you. Protect yourself by wearing long sleeves, pants, a wide-brimmed hat, and sunglasses year round, whenever you are outdoors.  Notify your caregiver of new moles or changes in moles, especially if there is a change in shape or color. Also notify your caregiver if a mole is larger than the size of a pencil eraser.  Stay current with your immunizations. Document Released: 04/04/2011 Document Revised: 01/14/2013 Document Reviewed: 04/04/2011 [facility] Patient Information 2014 [place].

## 2014-05-06 ENCOUNTER — Other Ambulatory Visit: Payer: Self-pay | Admitting: Gynecology

## 2014-05-06 ENCOUNTER — Ambulatory Visit (INDEPENDENT_AMBULATORY_CARE_PROVIDER_SITE_OTHER): Payer: Medicare Other

## 2014-05-06 DIAGNOSIS — N952 Postmenopausal atrophic vaginitis: Secondary | ICD-10-CM

## 2014-05-06 DIAGNOSIS — Z780 Asymptomatic menopausal state: Secondary | ICD-10-CM

## 2014-05-15 DIAGNOSIS — R300 Dysuria: Secondary | ICD-10-CM | POA: Diagnosis not present

## 2014-05-15 DIAGNOSIS — R109 Unspecified abdominal pain: Secondary | ICD-10-CM | POA: Diagnosis not present

## 2014-05-18 DIAGNOSIS — H9209 Otalgia, unspecified ear: Secondary | ICD-10-CM | POA: Diagnosis not present

## 2014-05-21 DIAGNOSIS — H9209 Otalgia, unspecified ear: Secondary | ICD-10-CM | POA: Diagnosis not present

## 2014-05-23 DIAGNOSIS — H9209 Otalgia, unspecified ear: Secondary | ICD-10-CM | POA: Diagnosis not present

## 2014-05-23 DIAGNOSIS — H6590 Unspecified nonsuppurative otitis media, unspecified ear: Secondary | ICD-10-CM | POA: Diagnosis not present

## 2014-06-17 DIAGNOSIS — Z23 Encounter for immunization: Secondary | ICD-10-CM | POA: Diagnosis not present

## 2014-06-20 DIAGNOSIS — M76899 Other specified enthesopathies of unspecified lower limb, excluding foot: Secondary | ICD-10-CM | POA: Diagnosis not present

## 2014-08-04 ENCOUNTER — Encounter: Payer: Self-pay | Admitting: Gynecology

## 2014-08-19 DIAGNOSIS — R21 Rash and other nonspecific skin eruption: Secondary | ICD-10-CM | POA: Diagnosis not present

## 2014-08-19 DIAGNOSIS — E119 Type 2 diabetes mellitus without complications: Secondary | ICD-10-CM | POA: Diagnosis not present

## 2014-08-19 DIAGNOSIS — F419 Anxiety disorder, unspecified: Secondary | ICD-10-CM | POA: Diagnosis not present

## 2014-08-19 DIAGNOSIS — Z23 Encounter for immunization: Secondary | ICD-10-CM | POA: Diagnosis not present

## 2014-08-19 DIAGNOSIS — I10 Essential (primary) hypertension: Secondary | ICD-10-CM | POA: Diagnosis not present

## 2014-08-22 ENCOUNTER — Other Ambulatory Visit: Payer: Self-pay | Admitting: Cardiovascular Disease

## 2014-09-06 ENCOUNTER — Other Ambulatory Visit: Payer: Self-pay | Admitting: Cardiology

## 2014-09-06 NOTE — Telephone Encounter (Signed)
Rx was sent to pharmacy electronically. 

## 2014-09-11 ENCOUNTER — Encounter (HOSPITAL_COMMUNITY): Payer: Self-pay | Admitting: Cardiovascular Disease

## 2014-10-10 ENCOUNTER — Encounter: Payer: Self-pay | Admitting: Cardiology

## 2014-10-10 ENCOUNTER — Ambulatory Visit (INDEPENDENT_AMBULATORY_CARE_PROVIDER_SITE_OTHER): Payer: Medicare Other | Admitting: Cardiology

## 2014-10-10 VITALS — BP 140/60 | HR 64 | Ht 64.0 in | Wt 150.0 lb

## 2014-10-10 DIAGNOSIS — I10 Essential (primary) hypertension: Secondary | ICD-10-CM | POA: Diagnosis not present

## 2014-10-10 DIAGNOSIS — I70219 Atherosclerosis of native arteries of extremities with intermittent claudication, unspecified extremity: Secondary | ICD-10-CM

## 2014-10-10 DIAGNOSIS — E785 Hyperlipidemia, unspecified: Secondary | ICD-10-CM | POA: Diagnosis not present

## 2014-10-10 LAB — BASIC METABOLIC PANEL
BUN: 31 mg/dL — AB (ref 6–23)
CHLORIDE: 101 meq/L (ref 96–112)
CO2: 27 mEq/L (ref 19–32)
CREATININE: 1.26 mg/dL — AB (ref 0.50–1.10)
Calcium: 9.5 mg/dL (ref 8.4–10.5)
Glucose, Bld: 101 mg/dL — ABNORMAL HIGH (ref 70–99)
POTASSIUM: 4 meq/L (ref 3.5–5.3)
Sodium: 138 mEq/L (ref 135–145)

## 2014-10-10 LAB — LIPID PANEL
Cholesterol: 228 mg/dL — ABNORMAL HIGH (ref 0–200)
HDL: 60 mg/dL (ref >39)
LDL CALC: 135 mg/dL — AB (ref 0–99)
Total CHOL/HDL Ratio: 3.8 Ratio
Triglycerides: 164 mg/dL — ABNORMAL HIGH (ref <150)
VLDL: 33 mg/dL (ref 0–40)

## 2014-10-10 NOTE — Patient Instructions (Signed)
Your physician recommends that you return for lab work today--Lipid profile/BMET.  You have been referred to the [facility] for  [place] or [place] --new cholesterol medications. You should receive a call from them in the next week or so.  Your physician wants you to follow-up in: 1 year with Dr Aundra Dubin. (January 2017).  You will receive a reminder letter in the mail two months in advance. If you don't receive a letter, please call our office to schedule the follow-up appointment.

## 2014-10-12 NOTE — Progress Notes (Signed)
Patient ID: Doris Carr, female   DOB: 06-05-1936, 79 y.o.   MRN: [id number] PCP: Dr. Harrington Challenger  79 yo with history of CKD, diabetes, chronic LBBB, PAD, and difficult to control HTN presents for cardiology followup.  Recently, blood pressure has been doing well and she is actually off spironolactone now.  She had a lower extremity angiogram in 2/13 with PTA and stenting of the left SFA and right CIA.  Renal arteries had no significant stenosis.  Weight is stable.  No exertional dyspnea, no chest pain.  She walks about 30 minutes/day and does yoga 3 times/week.  She has been tried on multiple cholesterol meds and has not tolerated any.  Currently, she is having minimal leg pain with walking.  Last ABIs in 6/15 suggested > 50% distal right SFA stenosis.   Labs (7/12): HgbA1c 5.9, K 3.8, creatinine 1.49, BUN 47 Labs (8/12): BNP 108, K 3.7, creatinine 1.3 => 1.4 Labs (12/12): K 4.5, creatinine 1.4 Labs (2/13): K 4.7, creatinine 0.96 Labs (6/13): K 4.7, creatinine 1.5, LDL 169 Labs (11/14): LDL 154 Labs (12/14): K 4.5, creatinine 1.5 Labs (5/15): K 4.4, creatinine 1.4  PMH: 1.  HTN: This has been difficult to control.  She has had renal artery doppler evaluation on two different occasions, both times with no evidence for renal artery stenosis.  She has had urinary catecholeamine collection that was unremarkable.  Edema with 10 mg amlodipine.  Renal artery angiogram in 2/13 showed no renal artery stenosis.  2. Diabetes mellitus type II: improved, no longer on medications.  3. CKD: Likely due to diabetes and HTN. 4. Chronic LBBB 5. Lung nodule with VATS and biopsy in 2006 that was negative for malignancy.  6. Depression 7. Anxiety 8. Obesity 9. Left heart cath in 6/06 with normal coronaries, EF 60%.  10.  Back pain: Sees a neurosurgeon.  11.  Hyperlipidemia: Myalgias with statins.  12.  Diastolic CHF: Echo (5/03) with EF 60-65%, moderate LV hypertrophy, mild LVH. 13.  PAD: Normal ABIs at [facility] in 6/11.   Lower extremity evaluation (12/12): ABI 0.79 right, 0.64 left; > 50% mid left SFA stenosis.  Peripheral angiogram in 2/13 with PTA and stenting of the left SFA and right CIA.  ABIs (5/14) 1.0 right/1.2 L; dopplers showed patent left SFA stent and moderate left CIA stenosis. ABIs (5/15) 0.88 right/0.96 left, left leg dopplers showed patent left SFA stent and aortoiliac dopplers showed patent right CIA stent and mild left CIA stenosis.  6/15 arterial dopplers showed > 50% distal right SFA stenosis.  SH: Married Building services engineer), lives in [place], [place] children.  Nonsmoker.    FH: Mother with pacemaker and Alzheimer's-type dementia.   ROS: All systems reviewed and negative except as per HPI.    Current Outpatient Prescriptions  Medication Sig Dispense Refill  . amLODipine (NORVASC) 5 MG tablet Take 1 tablet (5 mg total) by mouth daily. 30 tablet 11  . aspirin EC 81 MG tablet Take 1 tablet (81 mg total) by mouth daily.    . carvedilol (COREG) 12.5 MG tablet Take 25 mg by mouth 2 (two) times daily.      . chlorthalidone (HYGROTON) 25 MG tablet TAKE 1/2 TABLET BY MOUTH EVERY DAY 30 tablet 5  . cimetidine (TAGAMET) 400 MG tablet Take 400 mg by mouth 4 (four) times daily as needed. For heartburn    . citalopram (CELEXA) 20 MG tablet Take 20 mg by mouth daily.      . clonazePAM (KLONOPIN) 0.5  MG tablet Take 0.5 mg by mouth 2 (two) times daily as needed. 0.5 tab in the am, 1 tab in the pm    . clopidogrel (PLAVIX) 75 MG tablet TAKE 1 TABLET BY MOUTH EVERY DAY 30 tablet 6  . estropipate (OGEN) 1.5 MG tablet Take 1 tablet (1.5 mg total) by mouth daily. 30 tablet 11  . L-Methylfolate-B6-B12 (METANX) 3-35-2 MG TABS Take 1 tablet by mouth 2 (two) times daily.      Marland Kitchen losartan (COZAAR) 100 MG tablet Take 0.5 tablets (50 mg total) by mouth 2 (two) times daily. 30 tablet 11  . losartan (COZAAR) 50 MG tablet TAKE 1 TABLET BY MOUTH TWICE DAILY 180 tablet 1  . metFORMIN (GLUCOPHAGE-XR) 500 MG 24 hr tablet Take 500 mg by  mouth daily as needed.     . traZODone (DESYREL) 50 MG tablet Take 50 mg by mouth at bedtime. HALF A PILL AT BEDTIME    . benzonatate (TESSALON) 200 MG capsule Take 200 mg by mouth 2 (two) times daily as needed. For cough     No current facility-administered medications for this visit.    BP 140/60 mmHg  Pulse 64  Ht 5\' 4"  (1.626 m)  Wt 150 lb (68.04 kg)  BMI 25.73 kg/m2 General: NAD, overweight Neck: No JVD, no thyromegaly or thyroid nodule.  Lungs: Clear to auscultation bilaterally with normal respiratory effort. CV: Nondisplaced PMI.  Heart regular S1/S2, soft S4, no murmur.  No edema.  No carotid bruit.  1+ left PT pulse, unable to palpate right PT, no foot ulcerations. Bilateral lower leg venous varicosities.  Abdomen: Soft, nontender, no hepatosplenomegaly, no distention.   Neurologic: Alert and oriented x 3.  Psych: Normal affect. Extremities: No clubbing or cyanosis.   Assessment/Plan: 1. HTN: BP much better after weight loss.  Now off spironolactone.  SBP 110s at home. 2. Hyperlipidemia: Has extensive PAD but unable to tolerate any statin, Zetia, or Niacin.  Check lipids today.  I will refer her to lipid clinic to try to get her started on a PCSK9-inhibitor.   3. PAD: She had > 50% distal R SFA stenosis on last peripheral arterial dopplers but minimal symptoms currently.  Repeat dopplers in 6/16.  Continue Plavix.   Loralie Champagne 10/12/2014

## 2014-10-29 ENCOUNTER — Ambulatory Visit: Payer: Medicare Other | Admitting: Pharmacist

## 2014-10-31 ENCOUNTER — Ambulatory Visit (INDEPENDENT_AMBULATORY_CARE_PROVIDER_SITE_OTHER): Payer: Medicare Other | Admitting: Pharmacist

## 2014-10-31 DIAGNOSIS — E785 Hyperlipidemia, unspecified: Secondary | ICD-10-CM

## 2014-11-03 DIAGNOSIS — R399 Unspecified symptoms and signs involving the genitourinary system: Secondary | ICD-10-CM | POA: Diagnosis not present

## 2014-11-03 DIAGNOSIS — R8290 Unspecified abnormal findings in urine: Secondary | ICD-10-CM | POA: Diagnosis not present

## 2014-11-17 NOTE — Progress Notes (Signed)
HPI  [name] is a 79 yo pt of Dr. Claris Gladden who was referred to the lipid clinic for history of statin intolerance.  She has an extensive history which includes HTN, DM, CKD, PAD s/p sent to the left SFA and cright CIA in 11/2011, and hyperlipidemia.  Reviewed records available back to 2006.  All of those records state pt is statin intolerance but unable to state which statin she has tried.  She states everything she did try caused her legs and arms to hurt.  Only other medication she recalls trying was fenofibrate and this was stopped in 2011 for unknown reasons.  She does not recall trying Zetia.   RF: PAD, DM, HTN, age Goals: LDL <70, non-HDL <100 Current Meds: none Intolerance: statins (unsure of which ones or dose)  Labs:  10/2013: TC 228, TG 164, HDL 60, LDL 135 (no meds)  Current Outpatient Prescriptions  Medication Sig Dispense Refill  . amLODipine (NORVASC) 5 MG tablet Take 1 tablet (5 mg total) by mouth daily. 30 tablet 11  . aspirin EC 81 MG tablet Take 1 tablet (81 mg total) by mouth daily.    . benzonatate (TESSALON) 200 MG capsule Take 200 mg by mouth 2 (two) times daily as needed. For cough    . carvedilol (COREG) 12.5 MG tablet Take 25 mg by mouth 2 (two) times daily.      . chlorthalidone (HYGROTON) 25 MG tablet TAKE 1/2 TABLET BY MOUTH EVERY DAY 30 tablet 5  . cimetidine (TAGAMET) 400 MG tablet Take 400 mg by mouth 4 (four) times daily as needed. For heartburn    . citalopram (CELEXA) 20 MG tablet Take 20 mg by mouth daily.      . clonazePAM (KLONOPIN) 0.5 MG tablet Take 0.5 mg by mouth 2 (two) times daily as needed. 0.5 tab in the am, 1 tab in the pm    . clopidogrel (PLAVIX) 75 MG tablet TAKE 1 TABLET BY MOUTH EVERY DAY 30 tablet 6  . estropipate (OGEN) 1.5 MG tablet Take 1 tablet (1.5 mg total) by mouth daily. 30 tablet 11  . L-Methylfolate-B6-B12 (METANX) 3-35-2 MG TABS Take 1 tablet by mouth 2 (two) times daily.      Marland Kitchen losartan (COZAAR) 100 MG tablet Take 0.5 tablets  (50 mg total) by mouth 2 (two) times daily. 30 tablet 11  . losartan (COZAAR) 50 MG tablet TAKE 1 TABLET BY MOUTH TWICE DAILY 180 tablet 1  . metFORMIN (GLUCOPHAGE-XR) 500 MG 24 hr tablet Take 500 mg by mouth daily as needed.     . traZODone (DESYREL) 50 MG tablet Take 50 mg by mouth at bedtime. HALF A PILL AT BEDTIME     No current facility-administered medications for this visit.   Allergies  Allergen Reactions  . Ciprofloxacin   . Codeine   . Darvocet [Propoxyphene N-Acetaminophen]   . Darvon   . Hydralazine   . Hydrocodone Nausea And Vomiting  . Morphine And Related   . Pentazocine Lactate   . Potassium-Containing Compounds   . Procaine Hcl   . Sulfa Drugs Cross Reactors    Assessment and Plan 1.  Hyperlipidemia-  Reviewed chart from Dr. Doreatha Lew.  Unfortunately we do not have any of the specific statins she has tried documented.  Unless we retry the statins to show documentation, will not qualify for PCSK-9 at this time.

## 2015-02-17 DIAGNOSIS — E1142 Type 2 diabetes mellitus with diabetic polyneuropathy: Secondary | ICD-10-CM | POA: Diagnosis not present

## 2015-02-17 DIAGNOSIS — M549 Dorsalgia, unspecified: Secondary | ICD-10-CM | POA: Diagnosis not present

## 2015-02-17 DIAGNOSIS — I10 Essential (primary) hypertension: Secondary | ICD-10-CM | POA: Diagnosis not present

## 2015-02-17 DIAGNOSIS — E780 Pure hypercholesterolemia: Secondary | ICD-10-CM | POA: Diagnosis not present

## 2015-02-17 DIAGNOSIS — F329 Major depressive disorder, single episode, unspecified: Secondary | ICD-10-CM | POA: Diagnosis not present

## 2015-02-22 ENCOUNTER — Other Ambulatory Visit: Payer: Self-pay | Admitting: Cardiology

## 2015-02-24 DIAGNOSIS — M542 Cervicalgia: Secondary | ICD-10-CM | POA: Diagnosis not present

## 2015-02-24 DIAGNOSIS — M546 Pain in thoracic spine: Secondary | ICD-10-CM | POA: Diagnosis not present

## 2015-02-25 ENCOUNTER — Other Ambulatory Visit (HOSPITAL_COMMUNITY): Payer: Self-pay | Admitting: Cardiovascular Disease

## 2015-02-25 DIAGNOSIS — I700 Atherosclerosis of aorta: Secondary | ICD-10-CM

## 2015-02-25 DIAGNOSIS — I739 Peripheral vascular disease, unspecified: Secondary | ICD-10-CM

## 2015-03-03 ENCOUNTER — Ambulatory Visit (HOSPITAL_COMMUNITY): Payer: Medicare Other | Attending: Cardiovascular Disease

## 2015-03-03 ENCOUNTER — Ambulatory Visit (HOSPITAL_BASED_OUTPATIENT_CLINIC_OR_DEPARTMENT_OTHER): Payer: Medicare Other

## 2015-03-03 DIAGNOSIS — I739 Peripheral vascular disease, unspecified: Secondary | ICD-10-CM | POA: Insufficient documentation

## 2015-03-03 DIAGNOSIS — I700 Atherosclerosis of aorta: Secondary | ICD-10-CM | POA: Diagnosis not present

## 2015-03-04 DIAGNOSIS — M542 Cervicalgia: Secondary | ICD-10-CM | POA: Diagnosis not present

## 2015-03-04 DIAGNOSIS — M546 Pain in thoracic spine: Secondary | ICD-10-CM | POA: Diagnosis not present

## 2015-03-06 DIAGNOSIS — M542 Cervicalgia: Secondary | ICD-10-CM | POA: Diagnosis not present

## 2015-03-06 DIAGNOSIS — M546 Pain in thoracic spine: Secondary | ICD-10-CM | POA: Diagnosis not present

## 2015-03-08 ENCOUNTER — Other Ambulatory Visit: Payer: Self-pay | Admitting: Cardiovascular Disease

## 2015-03-09 ENCOUNTER — Encounter: Payer: Self-pay | Admitting: Cardiovascular Disease

## 2015-03-09 ENCOUNTER — Other Ambulatory Visit: Payer: Self-pay | Admitting: Cardiovascular Disease

## 2015-03-10 ENCOUNTER — Telehealth: Payer: Self-pay | Admitting: Cardiovascular Disease

## 2015-03-10 DIAGNOSIS — M542 Cervicalgia: Secondary | ICD-10-CM | POA: Diagnosis not present

## 2015-03-10 DIAGNOSIS — M546 Pain in thoracic spine: Secondary | ICD-10-CM | POA: Diagnosis not present

## 2015-03-10 NOTE — Telephone Encounter (Signed)
Patient wants to know about her results of ABI and LE duplex. Per Dr. Burt Knack, ABIs and lower extremity arterial duplex looks stable. Continue medical management. Reassured patient there are no new changes, stents are still working, and to continue on medications. Patient verbalized understanding.

## 2015-03-10 NOTE — Telephone Encounter (Signed)
Follow Up ° °Pt returned call//  °

## 2015-03-11 ENCOUNTER — Encounter: Payer: Self-pay | Admitting: Cardiovascular Disease

## 2015-03-11 NOTE — Telephone Encounter (Signed)
This encounter was created in error - please disregard.

## 2015-03-11 NOTE — Telephone Encounter (Signed)
Follow Up ° °Pt returning call from earlier. Please call. °

## 2015-03-11 NOTE — Telephone Encounter (Signed)
Follow Up    Pt is calling for test results. Please call.

## 2015-03-11 NOTE — Telephone Encounter (Signed)
I spoke with the pt's spouse and the pt is currently taking a nap. He will have the pt contact our office when she wakes up.

## 2015-03-13 DIAGNOSIS — M546 Pain in thoracic spine: Secondary | ICD-10-CM | POA: Diagnosis not present

## 2015-03-13 DIAGNOSIS — M542 Cervicalgia: Secondary | ICD-10-CM | POA: Diagnosis not present

## 2015-03-19 DIAGNOSIS — M546 Pain in thoracic spine: Secondary | ICD-10-CM | POA: Diagnosis not present

## 2015-03-19 DIAGNOSIS — M542 Cervicalgia: Secondary | ICD-10-CM | POA: Diagnosis not present

## 2015-03-23 ENCOUNTER — Encounter: Payer: Self-pay | Admitting: Cardiovascular Disease

## 2015-03-23 ENCOUNTER — Ambulatory Visit (INDEPENDENT_AMBULATORY_CARE_PROVIDER_SITE_OTHER): Payer: Medicare Other | Admitting: Cardiovascular Disease

## 2015-03-23 VITALS — BP 122/62 | HR 64 | Ht 64.0 in | Wt 149.0 lb

## 2015-03-23 DIAGNOSIS — I739 Peripheral vascular disease, unspecified: Secondary | ICD-10-CM

## 2015-03-23 DIAGNOSIS — I70219 Atherosclerosis of native arteries of extremities with intermittent claudication, unspecified extremity: Secondary | ICD-10-CM

## 2015-03-23 NOTE — Patient Instructions (Signed)
Medication Instructions:  Your physician recommends that you continue on your current medications as directed. Please refer to the Current Medication list given to you today.  Labwork: No new orders.   Testing/Procedures: Your physician has requested that you have an ankle brachial index (ABI) in 1 YEAR. During this test an ultrasound and blood pressure cuff are used to evaluate the arteries that supply the arms and legs with blood. Allow thirty minutes for this exam. There are no restrictions or special instructions.  Follow-Up: Your physician wants you to follow-up in: 1 YEAR with Dr Burt Knack.  You will receive a reminder letter in the mail two months in advance. If you don't receive a letter, please call our office to schedule the follow-up appointment.   Any Other Special Instructions Will Be Listed Below (If Applicable).

## 2015-03-23 NOTE — Progress Notes (Signed)
Cardiology Office Note Date:  03/23/2015   ID:  Russie, Gulledge [birth year], MRN [id number]  PCP:   Melinda Crutch, MD  Cardiologist:  Sherren Mocha, MD    Chief Complaint  Patient presents with  . PAD     History of Present Illness: Doris Carr is a 79 y.o. female who presents for follow-up of peripheral arterial disease.   She's undergone right common iliac and left SFA stenting in the past.  She had recent ABIs of 0.89 on the right and 0.95 on the left. She denies any symptoms of calf claudication. She complains of generalized leg weakness when she walks for an extended period, but admits that this is long-standing. She's had no ulceration or rest pain. Overall she feels she is doing quite well. Her blood pressure has been under good control. She has no chest pain or shortness of breath. She is primarily limited by problems with her back.  Past Medical History  Diagnosis Date  . Balance problems   . Syncope and collapse   . Left bundle branch block   . CKD (chronic kidney disease)     likely due to DM2 and HTN  . Lower extremity edema     CHRONIC  . Depression   . Anxiety   . Obesity   . 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 Hypertension     This has been difficult to control. She has had renal artery doppler evaluation on two different occasions, both times with no evidence for renal artery stenosis. She has had urinary catecholeamine collection that was unremarkable. Edema with 10 mg amlodipine.   . Peripheral vascular disease     Lower extremity evaluation (12/12): ABI 0.79 right, 0.64 left; > 50% mid left SFA stenosis;  s/p L SFA stent and stent to R CIA 11/2011; ABI's post PTA - R 0.90, L 0.96 (TBI on R 0.51, L 0.61)  . GERD (gastroesophageal reflux disease)   . Diabetes mellitus     type  2  . Lung nodule     VATS and biopsy in 2006 that was negative for malignancy  . S/P cardiac catheterization     Left heart cath in 6/06 with normal  coronaries, EF 60%.  . Back pain     sees a neurosurgeon  . HLD (hyperlipidemia)     myalgias with statins  . Chronic diastolic heart failure     Echo (8/12) with EF 60-65%, moderate LV hypertrophy, mild LVH    Past Surgical History  Procedure Laterality Date  . Thoracotomy    . Cardiac catheterization  2006    NORMAL CORONARIES  . Cervical cone biopsy    . Thymic cyst removal    . Cataract extraction    . Tonsillectomy    . Abdominal aortogram  11/09/2011  . Gynecologic cryosurgery    . Vaginal hysterectomy  1985    WITH A/P REPAIR  . Stints in leg arteries    . Rectal ultrasound N/A 05/08/2013    Procedure: RECTAL ULTRASOUND;  Surgeon: Leighton Ruff, MD;  Location: [facility];  Service: Endoscopy;  Laterality: N/A;  . Flexible sigmoidoscopy N/A 05/08/2013    Procedure: FLEXIBLE SIGMOIDOSCOPY;  Surgeon: Leighton Ruff, MD;  Location: [facility];  Service: Endoscopy;  Laterality: N/A;  rigid proctoscope get from o.r.  . Anal rectal manometry N/A 05/08/2013    Procedure: ANAL RECTAL MANOMETRY;  Surgeon: Leighton Ruff, MD;  Location: [facility];  Service: Endoscopy;  Laterality: N/A;  . Abdominal aortagram N/A 11/09/2011    Procedure: ABDOMINAL AORTAGRAM;  Surgeon: Sherren Mocha, MD;  Location: [facility] CATH [facility];  Service: Cardiovascular;  Laterality: N/A;    Current Outpatient Prescriptions  Medication Sig Dispense Refill  . amLODipine (NORVASC) 5 MG tablet Take 1 tablet (5 mg total) by mouth daily. 30 tablet 11  . aspirin EC 81 MG tablet Take 1 tablet (81 mg total) by mouth daily.    . benzonatate (TESSALON) 200 MG capsule Take 200 mg by mouth 2 (two) times daily as needed. For cough    . carvedilol (COREG) 12.5 MG tablet Take 25 mg by mouth 2 (two) times daily.      . chlorthalidone (HYGROTON) 25 MG tablet TAKE 1/2 TABLET BY MOUTH EVERY DAY 30 tablet 5  . cimetidine (TAGAMET) 400 MG tablet Take 400 mg by mouth 4 (four) times daily as needed. For heartburn    . citalopram (CELEXA) 20  MG tablet Take 20 mg by mouth daily.      . clonazePAM (KLONOPIN) 0.5 MG tablet Take 0.5 mg by mouth 2 (two) times daily as needed. 0.5 tab in the am, 1 tab in the pm    . clopidogrel (PLAVIX) 75 MG tablet TAKE 1 TABLET BY MOUTH EVERY DAY 90 tablet 0  . estropipate (OGEN) 1.5 MG tablet Take 1 tablet (1.5 mg total) by mouth daily. 30 tablet 11  . L-Methylfolate-B6-B12 (METANX) 3-35-2 MG TABS Take 1 tablet by mouth 2 (two) times daily.      Marland Kitchen losartan (COZAAR) 50 MG tablet TAKE 1 TABLET BY MOUTH TWICE DAILY 180 tablet 1  . metFORMIN (GLUCOPHAGE-XR) 500 MG 24 hr tablet Take 500 mg by mouth daily as needed.     . traZODone (DESYREL) 50 MG tablet Take 50 mg by mouth at bedtime. HALF A PILL AT BEDTIME     No current facility-administered medications for this visit.    Allergies:   Ciprofloxacin; Codeine; Darvocet; Darvon; Hydralazine; Hydrocodone; Morphine and related; Pentazocine lactate; Potassium-containing compounds; Procaine hcl; and Sulfa drugs cross reactors   Social History:  The patient  reports that she quit smoking about 30 years ago. Her smoking use included Cigarettes. She has a 12 pack-year smoking history. She has never used smokeless tobacco. She reports that she does not drink alcohol or use illicit drugs.   Family History:  The patient's  family history includes Alzheimer's disease in her father and mother; Diabetes in her paternal grandfather; Heart disease in her mother; Hypertension in her mother.    ROS:  Please see the history of present illness.  Otherwise, review of systems is positive for back pain, muscle pain, easy bruising.  All other systems are reviewed and negative.    PHYSICAL EXAM: VS:  BP 122/62 mmHg  Pulse 64  Ht 5\' 4"  (1.626 m)  Wt 149 lb (67.586 kg)  BMI 25.56 kg/m2 , BMI Body mass index is 25.56 kg/(m^2). GEN: Well nourished, well developed, in no acute distress HEENT: normal Neck: no JVD, no masses. No carotid bruits Cardiac: RRR without murmur or  gallop                Respiratory:  clear to auscultation bilaterally, normal work of breathing GI: soft, nontender, nondistended, + BS MS: no deformity or atrophy Ext: no pretibial edema,  femoral pulses 2+ bilaterally, DP pulses 2+ bilaterally Skin: warm and dry, no rash Neuro:  Strength and sensation are  intact Psych: euthymic mood, full affect  EKG:  EKG is not ordered today.  Recent Labs: 10/10/2014: BUN 31*; Creat 1.26*; Potassium 4.0; Sodium 138   Lipid Panel     Component Value Date/Time   CHOL 228* 10/10/2014 1639   TRIG 164* 10/10/2014 1639   HDL 60 10/10/2014 1639   CHOLHDL 3.8 10/10/2014 1639   VLDL 33 10/10/2014 1639   LDLCALC 135* 10/10/2014 1639   LDLDIRECT 154.4 08/28/2013 1117      Wt Readings from Last 3 Encounters:  03/23/15 149 lb (67.586 kg)  10/10/14 150 lb (68.04 kg)  04/29/14 149 lb (67.586 kg)     Cardiac Studies Reviewed: ABI's 0.89 on the right and 0.95 on the left  ASSESSMENT AND PLAN: Lower extremity peripheral arterial disease: she's been treated with aspirin and clopidogrel without bleeding problems. Other cardiovascular problems are well controlled with medical therapy and no changes were made to her drug regimen today. I will see her back in one year with lower extremity Doppler studies and ABIs.  Current medicines are reviewed with the patient today.  The patient  concerns regarding medicines.  Labs/ tests ordered today include:  No orders of the defined types were placed in this encounter.    Disposition:   FU one year  Signed, Sherren Mocha, MD  03/23/2015 10:01 AM    [facility] [place], [place], [place]  [address] Phone: [phone]; Fax: [phone]

## 2015-03-24 DIAGNOSIS — M542 Cervicalgia: Secondary | ICD-10-CM | POA: Diagnosis not present

## 2015-03-24 DIAGNOSIS — M546 Pain in thoracic spine: Secondary | ICD-10-CM | POA: Diagnosis not present

## 2015-03-27 DIAGNOSIS — M546 Pain in thoracic spine: Secondary | ICD-10-CM | POA: Diagnosis not present

## 2015-03-27 DIAGNOSIS — M542 Cervicalgia: Secondary | ICD-10-CM | POA: Diagnosis not present

## 2015-03-31 DIAGNOSIS — M546 Pain in thoracic spine: Secondary | ICD-10-CM | POA: Diagnosis not present

## 2015-03-31 DIAGNOSIS — M542 Cervicalgia: Secondary | ICD-10-CM | POA: Diagnosis not present

## 2015-04-03 DIAGNOSIS — M546 Pain in thoracic spine: Secondary | ICD-10-CM | POA: Diagnosis not present

## 2015-04-03 DIAGNOSIS — M542 Cervicalgia: Secondary | ICD-10-CM | POA: Diagnosis not present

## 2015-04-13 DIAGNOSIS — M542 Cervicalgia: Secondary | ICD-10-CM | POA: Diagnosis not present

## 2015-04-13 DIAGNOSIS — M546 Pain in thoracic spine: Secondary | ICD-10-CM | POA: Diagnosis not present

## 2015-04-15 DIAGNOSIS — M542 Cervicalgia: Secondary | ICD-10-CM | POA: Diagnosis not present

## 2015-04-15 DIAGNOSIS — M546 Pain in thoracic spine: Secondary | ICD-10-CM | POA: Diagnosis not present

## 2015-04-20 DIAGNOSIS — M542 Cervicalgia: Secondary | ICD-10-CM | POA: Diagnosis not present

## 2015-04-20 DIAGNOSIS — M546 Pain in thoracic spine: Secondary | ICD-10-CM | POA: Diagnosis not present

## 2015-04-23 DIAGNOSIS — Z1231 Encounter for screening mammogram for malignant neoplasm of breast: Secondary | ICD-10-CM | POA: Diagnosis not present

## 2015-04-24 ENCOUNTER — Encounter: Payer: Self-pay | Admitting: Gynecology

## 2015-04-24 DIAGNOSIS — M542 Cervicalgia: Secondary | ICD-10-CM | POA: Diagnosis not present

## 2015-04-24 DIAGNOSIS — M546 Pain in thoracic spine: Secondary | ICD-10-CM | POA: Diagnosis not present

## 2015-04-27 DIAGNOSIS — M546 Pain in thoracic spine: Secondary | ICD-10-CM | POA: Diagnosis not present

## 2015-04-27 DIAGNOSIS — M542 Cervicalgia: Secondary | ICD-10-CM | POA: Diagnosis not present

## 2015-05-01 DIAGNOSIS — M546 Pain in thoracic spine: Secondary | ICD-10-CM | POA: Diagnosis not present

## 2015-05-01 DIAGNOSIS — M542 Cervicalgia: Secondary | ICD-10-CM | POA: Diagnosis not present

## 2015-05-02 ENCOUNTER — Other Ambulatory Visit: Payer: Self-pay | Admitting: Gynecology

## 2015-05-04 DIAGNOSIS — M546 Pain in thoracic spine: Secondary | ICD-10-CM | POA: Diagnosis not present

## 2015-05-04 DIAGNOSIS — M542 Cervicalgia: Secondary | ICD-10-CM | POA: Diagnosis not present

## 2015-05-04 NOTE — Telephone Encounter (Signed)
Annual scheduled on 06/17/15

## 2015-05-08 DIAGNOSIS — M546 Pain in thoracic spine: Secondary | ICD-10-CM | POA: Diagnosis not present

## 2015-05-08 DIAGNOSIS — M542 Cervicalgia: Secondary | ICD-10-CM | POA: Diagnosis not present

## 2015-06-16 DIAGNOSIS — Z23 Encounter for immunization: Secondary | ICD-10-CM | POA: Diagnosis not present

## 2015-06-17 ENCOUNTER — Ambulatory Visit (INDEPENDENT_AMBULATORY_CARE_PROVIDER_SITE_OTHER): Payer: Medicare Other | Admitting: Gynecology

## 2015-06-17 ENCOUNTER — Encounter: Payer: Self-pay | Admitting: Gynecology

## 2015-06-17 VITALS — BP 114/70 | Ht 64.0 in | Wt 149.0 lb

## 2015-06-17 DIAGNOSIS — N952 Postmenopausal atrophic vaginitis: Secondary | ICD-10-CM

## 2015-06-17 DIAGNOSIS — Z79890 Hormone replacement therapy (postmenopausal): Secondary | ICD-10-CM

## 2015-06-17 DIAGNOSIS — Z01419 Encounter for gynecological examination (general) (routine) without abnormal findings: Secondary | ICD-10-CM | POA: Diagnosis not present

## 2015-06-17 DIAGNOSIS — N816 Rectocele: Secondary | ICD-10-CM | POA: Diagnosis not present

## 2015-06-17 MED ORDER — ESTROPIPATE 1.5 MG PO TABS: 1.5000 mg | ORAL_TABLET | Freq: Every day | ORAL | Status: DC

## 2015-06-17 NOTE — Progress Notes (Signed)
Doris Carr September 30, 1936 [id number]        79 y.o.  G3P3003 for breast and pelvic exam. Several issues noted below.  Past medical history,surgical history, problem list, medications, allergies, family history and social history were all reviewed and documented as reviewed in the EPIC chart.  ROS:  Performed with pertinent positives and negatives included in the history, assessment and plan.   Additional significant findings :  none   Exam: Kim Counsellor Vitals:   06/17/15 1125  BP: 114/70  Height: 5\' 4"  (1.626 m)  Weight: 149 lb (67.586 kg)   General appearance:  Normal affect, orientation and appearance. Skin: Grossly normal HEENT: Without gross lesions.  No cervical or supraclavicular adenopathy. Thyroid normal.  Lungs:  Clear without wheezing, rales or rhonchi Cardiac: RR, without RMG Abdominal:  Soft, nontender, without masses, guarding, rebound, organomegaly or hernia Breasts:  Examined lying and sitting without masses, retractions, discharge or axillary adenopathy. Pelvic:  Ext/BUS/vagina with generalized atrophic changes. First to second-degree rectocele. Cuff well supported. No significant cystocele.  Adnexa  Without masses or tenderness    Anus and perineum  Normal   Rectovaginal  Normal sphincter tone without palpated masses or tenderness.    Assessment/Plan:  79 y.o. G9P3003 female for breast and pelvic exam..   1. Postmenopausal/HRT. She continues on Ogen 1.25 mg that she snaps in half for a 0.62 mg dose. Has tried stopping in the past and does not like the way she feels. I again reviewed the whole issue of her age and peripheral vascular disease with a realistic increased risk of thrombosis including stroke heart attack DVT. Also discussed possible breast cancer association. Has been on this for years and really hurts to continue. She does take a aspirin and Plavix daily. She clearly understands the risks and accepts them and I refilled her 1 year. 2. Rectocele.  Stable over serial exams. Not an issue as far as symptoms. Plan to continue to monitor and follow up it becomes an issue. 3. Pap smear 2011. No Pap smear done today. History of high-grade dysplasia with Cone biopsy. Subsequent hysterectomy. Negative Pap smears since then.  We both agreed to stop screening and she is comfortable with this. 4. Mammography 04/2015. Continue with annual mammography. SBE monthly reviewed. 5. DEXA 2015 normal. Repeat at 5 year interval. Increased calcium vitamin D reviewed. 6. Sigmoidoscopy 2014.  Repeat at their recommended interval. 7. Health maintenance. No routine blood work done as this is done at her primary physician's office. Follow up in one year, sooner as needed.   Anastasio Auerbach MD, 11:53 AM 06/17/2015

## 2015-06-17 NOTE — Patient Instructions (Signed)

## 2015-06-17 NOTE — Addendum Note (Signed)
Addended by: Joaquin Music on: 06/17/2015 02:47 PM   Modules accepted: Orders

## 2015-06-27 ENCOUNTER — Other Ambulatory Visit: Payer: Self-pay | Admitting: Gynecology

## 2015-06-30 ENCOUNTER — Other Ambulatory Visit: Payer: Self-pay | Admitting: Cardiovascular Disease

## 2015-07-29 ENCOUNTER — Ambulatory Visit (INDEPENDENT_AMBULATORY_CARE_PROVIDER_SITE_OTHER): Payer: Medicare Other | Admitting: Gynecology

## 2015-07-29 ENCOUNTER — Encounter: Payer: Self-pay | Admitting: Gynecology

## 2015-07-29 VITALS — BP 120/76

## 2015-07-29 DIAGNOSIS — N3000 Acute cystitis without hematuria: Secondary | ICD-10-CM

## 2015-07-29 DIAGNOSIS — I70219 Atherosclerosis of native arteries of extremities with intermittent claudication, unspecified extremity: Secondary | ICD-10-CM

## 2015-07-29 LAB — URINALYSIS W MICROSCOPIC + REFLEX CULTURE
Bilirubin Urine: NEGATIVE
CASTS: NONE SEEN [LPF]
Crystals: NONE SEEN [HPF]
Glucose, UA: NEGATIVE
NITRITE: POSITIVE — AB
YEAST: NONE SEEN [HPF]
pH: 5 (ref 5.0–8.0)

## 2015-07-29 MED ORDER — NITROFURANTOIN MONOHYD MACRO 100 MG PO CAPS: 100.0000 mg | ORAL_CAPSULE | Freq: Two times a day (BID) | ORAL | Status: DC

## 2015-07-29 NOTE — Addendum Note (Signed)
Addended by: Nelva Nay on: 07/29/2015 01:57 PM   Modules accepted: Orders

## 2015-07-29 NOTE — Patient Instructions (Signed)
Take the antibiotic twice daily for 7 days. Call me if your symptoms persist, worsen or recur.

## 2015-07-29 NOTE — Progress Notes (Signed)
Doris Carr 04-24-1936 [id number]        79 y.o.  G3P3003 Presents with several days history of urinary incontinence. Patient was not having issues at all and then started losing urine spontaneously. She is not having any pain, frequency, dysuria, urgency. No fever or chills. No nausea vomiting diarrhea constipation. No history of same before.  Past medical history,surgical history, problem list, medications, allergies, family history and social history were all reviewed and documented in the EPIC chart.  Directed ROS with pertinent positives and negatives documented in the history of present illness/assessment and plan.  Exam: Filed Vitals:   07/29/15 1044  BP: 120/76   General appearance:  Normal, no acute distress Spine straight without CVA tenderness Abdomen soft nontender without masses guarding rebound   Assessment/Plan:  79 y.o. U2G2542 patient with above history. Was unable to give [place] a urine specimen after an hour of drinking fluids. And intermittent in and out catheterization was performed with low amount of urine obtained. Urinalysis consistent with UTI showing WBC packed, R BC packed, many bacteria, 0-5 squamous cells. Specimen sent for culture. Patient is allergic to ciprofloxacin and sulfa. We'll initiate Macrobid 100 mg twice a day 7 days and await culture sensitivities. Patient will follow up if her symptoms persist, worsen or recur.    Anastasio Auerbach MD, 11:15 AM 07/29/2015

## 2015-07-31 LAB — URINE CULTURE

## 2015-08-12 ENCOUNTER — Other Ambulatory Visit: Payer: Self-pay | Admitting: Cardiovascular Disease

## 2015-08-12 ENCOUNTER — Other Ambulatory Visit: Payer: Self-pay

## 2015-08-12 MED ORDER — LOSARTAN POTASSIUM 50 MG PO TABS: 50.0000 mg | ORAL_TABLET | Freq: Two times a day (BID) | ORAL | Status: DC

## 2015-08-12 NOTE — Telephone Encounter (Signed)
Approved      Disp Refills Start End    losartan (COZAAR) 50 MG tablet 180 tablet 1 02/23/2015     Sig:  TAKE 1 TABLET BY MOUTH TWICE DAILY    Class:  Normal    DAW:  No    Authorizing Provider:  Larey Dresser, MD    Ordering User:  Juventino Slovak, CMA

## 2015-08-17 DIAGNOSIS — E7800 Pure hypercholesterolemia, unspecified: Secondary | ICD-10-CM | POA: Diagnosis not present

## 2015-08-17 DIAGNOSIS — I10 Essential (primary) hypertension: Secondary | ICD-10-CM | POA: Diagnosis not present

## 2015-08-17 DIAGNOSIS — F329 Major depressive disorder, single episode, unspecified: Secondary | ICD-10-CM | POA: Diagnosis not present

## 2015-08-17 DIAGNOSIS — E1142 Type 2 diabetes mellitus with diabetic polyneuropathy: Secondary | ICD-10-CM | POA: Diagnosis not present

## 2015-08-20 DIAGNOSIS — E1142 Type 2 diabetes mellitus with diabetic polyneuropathy: Secondary | ICD-10-CM | POA: Diagnosis not present

## 2015-08-20 DIAGNOSIS — I10 Essential (primary) hypertension: Secondary | ICD-10-CM | POA: Diagnosis not present

## 2015-08-20 DIAGNOSIS — E7800 Pure hypercholesterolemia, unspecified: Secondary | ICD-10-CM | POA: Diagnosis not present

## 2015-08-20 DIAGNOSIS — F419 Anxiety disorder, unspecified: Secondary | ICD-10-CM | POA: Diagnosis not present

## 2015-08-20 DIAGNOSIS — M19041 Primary osteoarthritis, right hand: Secondary | ICD-10-CM | POA: Diagnosis not present

## 2015-08-20 DIAGNOSIS — Z7984 Long term (current) use of oral hypoglycemic drugs: Secondary | ICD-10-CM | POA: Diagnosis not present

## 2015-08-20 DIAGNOSIS — Z0000 Encounter for general adult medical examination without abnormal findings: Secondary | ICD-10-CM | POA: Diagnosis not present

## 2015-09-24 DIAGNOSIS — J321 Chronic frontal sinusitis: Secondary | ICD-10-CM | POA: Diagnosis not present

## 2015-12-05 DIAGNOSIS — M1711 Unilateral primary osteoarthritis, right knee: Secondary | ICD-10-CM | POA: Diagnosis not present

## 2015-12-17 DIAGNOSIS — M1711 Unilateral primary osteoarthritis, right knee: Secondary | ICD-10-CM | POA: Diagnosis not present

## 2016-01-08 DIAGNOSIS — M1711 Unilateral primary osteoarthritis, right knee: Secondary | ICD-10-CM | POA: Diagnosis not present

## 2016-01-20 DIAGNOSIS — M1711 Unilateral primary osteoarthritis, right knee: Secondary | ICD-10-CM | POA: Diagnosis not present

## 2016-01-26 DIAGNOSIS — M1711 Unilateral primary osteoarthritis, right knee: Secondary | ICD-10-CM | POA: Diagnosis not present

## 2016-01-26 DIAGNOSIS — S83231A Complex tear of medial meniscus, current injury, right knee, initial encounter: Secondary | ICD-10-CM | POA: Diagnosis not present

## 2016-01-28 ENCOUNTER — Telehealth: Payer: Self-pay | Admitting: *Deleted

## 2016-01-28 NOTE — Telephone Encounter (Signed)
Dr Aundra Dubin received fax from Dr Gladstone Lighter requesting clearance prior to knee surgery.  Pt aware appt has been scheduled with Kerin Ransom, PA 02/03/16 for office visit to evaluate for surgical clearance.   Surgical Clearance form from Dr Gladstone Lighter has been given to Rod Holler for fax to [employer].

## 2016-01-28 NOTE — Telephone Encounter (Signed)
Surgical Clearance form has been given to [place], not Rod Holler in precious note, for fax to [employer].

## 2016-02-03 ENCOUNTER — Encounter: Payer: Self-pay | Admitting: Cardiology

## 2016-02-03 ENCOUNTER — Ambulatory Visit (INDEPENDENT_AMBULATORY_CARE_PROVIDER_SITE_OTHER): Payer: Medicare Other | Admitting: Cardiology

## 2016-02-03 VITALS — BP 88/48 | HR 56 | Ht 64.0 in | Wt 152.0 lb

## 2016-02-03 DIAGNOSIS — N183 Chronic kidney disease, stage 3 unspecified: Secondary | ICD-10-CM

## 2016-02-03 DIAGNOSIS — Z01810 Encounter for preprocedural cardiovascular examination: Secondary | ICD-10-CM | POA: Diagnosis not present

## 2016-02-03 DIAGNOSIS — I10 Essential (primary) hypertension: Secondary | ICD-10-CM

## 2016-02-03 DIAGNOSIS — I739 Peripheral vascular disease, unspecified: Secondary | ICD-10-CM

## 2016-02-03 DIAGNOSIS — IMO0001 Reserved for inherently not codable concepts without codable children: Secondary | ICD-10-CM

## 2016-02-03 DIAGNOSIS — I5032 Chronic diastolic (congestive) heart failure: Secondary | ICD-10-CM | POA: Diagnosis not present

## 2016-02-03 DIAGNOSIS — I447 Left bundle-branch block, unspecified: Secondary | ICD-10-CM | POA: Diagnosis not present

## 2016-02-03 DIAGNOSIS — Z0389 Encounter for observation for other suspected diseases and conditions ruled out: Secondary | ICD-10-CM

## 2016-02-03 DIAGNOSIS — N189 Chronic kidney disease, unspecified: Secondary | ICD-10-CM

## 2016-02-03 HISTORY — DX: Encounter for preprocedural cardiovascular examination: Z01.810

## 2016-02-03 NOTE — Assessment & Plan Note (Signed)
Controlled- re check

## 2016-02-03 NOTE — Assessment & Plan Note (Signed)
Previously she has had difficult to control hypertension. Her B/P today intitially was 88 systolic. Re check before she left the office was 123456 systolic. She tells me her B/P has been running around 123XX123 systolic at home. Previous renal dopplers negative for RAS.

## 2016-02-03 NOTE — Progress Notes (Signed)
02/03/2016 Doris Carr   1936/04/03  [id number]  Primary Physician  Melinda Crutch, MD Primary Cardiologist: Dr McLean/ Dr Burt Knack  HPI:  Pleasant 80 y/o female with a history of PVD, HTN, DM,labile HTN, and prior normal coronaries in 2006. He last echo was in in 2012 and showed normal LVF. She is here today for pre op clearance prior to arthroscopic Rt knee surgery. She has no history of MI. She denies chest pain. She is able to walk around the block and had been doing daily yoga till she injured her knee. She tells me her legs "give out" if she walks too far but I'm not convinced this is claudication.    Current Outpatient Prescriptions  Medication Sig Dispense Refill  . amLODipine (NORVASC) 5 MG tablet Take 1 tablet (5 mg total) by mouth daily. 30 tablet 11  . aspirin EC 81 MG tablet Take 1 tablet (81 mg total) by mouth daily.    . benzonatate (TESSALON) 200 MG capsule Take 200 mg by mouth 2 (two) times daily as needed. For cough    . carvedilol (COREG) 25 MG tablet TK 1 T PO BID  4  . chlorthalidone (HYGROTON) 25 MG tablet TAKE 1/2 TABLET BY MOUTH EVERY DAY 30 tablet 5  . citalopram (CELEXA) 20 MG tablet Take 20 mg by mouth daily.      . clonazePAM (KLONOPIN) 0.5 MG tablet Take 0.5 mg by mouth 2 (two) times daily as needed. 0.5 tab in the am, 1 tab in the pm    . clopidogrel (PLAVIX) 75 MG tablet Take 1 tablet (75 mg total) by mouth daily. 90 tablet 2  . estropipate (OGEN) 1.5 MG tablet Take 1 tablet (1.5 mg total) by mouth daily. 30 tablet 12  . L-Methylfolate-B6-B12 (METANX) 3-35-2 MG TABS Take 1 tablet by mouth 2 (two) times daily.      Marland Kitchen losartan (COZAAR) 50 MG tablet Take 50 mg by mouth at bedtime.    . metFORMIN (GLUCOPHAGE-XR) 500 MG 24 hr tablet Take 500 mg by mouth daily as needed.     . nitrofurantoin, macrocrystal-monohydrate, (MACROBID) 100 MG capsule Take 1 capsule (100 mg total) by mouth 2 (two) times daily. For 7 days 14 capsule 0  . traZODone (DESYREL) 50 MG tablet Take 50  mg by mouth at bedtime. HALF A PILL AT BEDTIME     No current facility-administered medications for this visit.    Allergies  Allergen Reactions  . Ciprofloxacin   . Codeine   . Darvocet [Propoxyphene N-Acetaminophen]   . Darvon   . Hydralazine   . Hydrocodone Nausea And Vomiting  . Morphine And Related   . Pentazocine Lactate   . Potassium-Containing Compounds   . Procaine Hcl   . Sulfa Drugs Cross Reactors     Social History   Social History  . Marital Status: Married    Spouse Name: N/A  . Number of Children: N/A  . Years of Education: N/A   Occupational History  . Not on file.   Social History Main Topics  . Smoking status: Former Smoker -- 0.80 packs/day for 15 years    Types: Cigarettes    Quit date: 11/03/1984  . Smokeless tobacco: Never Used  . Alcohol Use: No  . Drug Use: No  . Sexual Activity: Yes    Birth Control/ Protection: Surgical, Post-menopausal     Comment: 1st intercourse 71 yo-1 partner   Other Topics Concern  . Not on file  Social History Narrative     Review of Systems: General: negative for chills, fever, night sweats or weight changes.  Cardiovascular: negative for chest pain, dyspnea on exertion, edema, orthopnea, palpitations, paroxysmal nocturnal dyspnea or shortness of breath Dermatological: negative for rash Respiratory: negative for cough or wheezing Urologic: negative for hematuria Abdominal: negative for nausea, vomiting, diarrhea, bright red blood per rectum, melena, or hematemesis Neurologic: negative for visual changes, syncope, or dizziness All other systems reviewed and are otherwise negative except as noted above.    Blood pressure 88/48, pulse 56, height 5\' 4"  (1.626 m), weight 152 lb (68.947 kg).  General appearance: alert, cooperative and no distress Neck: no carotid bruit and no JVD Lungs: clear to auscultation bilaterally Heart: regular rate and rhythm Extremities: no edema Skin: Skin color, texture, turgor  normal. No rashes or lesions Neurologic: Grossly normal  EKG NSR, SB (56), LBBB  ASSESSMENT AND PLAN:   Pre-operative cardiovascular examination Pt is in the office today for pre op clearance prior to Rt knee arthroscopic surgery  PVD (peripheral vascular disease) ([place]) Rt CIA and Lt SFA PTA Feb 2013 ABI's 0.89 on Lt and 0.95 on Rt May 2016 No convincing claudication history currently  Left bundle branch block Controlled- re check  Diabetes mellitus with nephropathy ([place]) Followed by PCP, Type NIDDM  Chronic renal insufficiency, stage III (moderate) GFR 40  Normal coronary arteries  2006, normal LVF by echo 2012  HTN (hypertension) Previously she has had difficult to control hypertension. Her B/P today intitially was 88 systolic. Re check before she left the office was 123456 systolic. She tells me her B/P has been running around 123XX123 systolic at home. Previous renal dopplers negative for RAS.   PLAN  The pt is an acceptable cardiovascular risk for the proposed surgery. It is OK to stop her Plavix pre op if needed, would resume post op when you feel it's appropriate.   Her B/P is a little low today and she tells me its been running low at home. I suggested she skip her morning Cozaar 50 mg dose and take only the pm dose and follow her B/P.   She is due for LEA dopplers and f/u in June.   Kerin Ransom K PA-C 02/03/2016 2:06 PM

## 2016-02-03 NOTE — Assessment & Plan Note (Signed)
Pt is in the office today for pre op clearance prior to Rt knee arthroscopic surgery

## 2016-02-03 NOTE — Assessment & Plan Note (Addendum)
GFR 40

## 2016-02-03 NOTE — Assessment & Plan Note (Addendum)
Rt CIA and Lt SFA PTA Feb 2013 ABI's 0.89 on Lt and 0.95 on Rt May 2016 No convincing claudication history currently

## 2016-02-03 NOTE — Assessment & Plan Note (Signed)
2006, normal LVF by echo 2012

## 2016-02-03 NOTE — Patient Instructions (Addendum)
Medication Instructions:  DECREASE YOUR LOSARTAN TO ONCE DAILY AT NIGHT.  CONTINUE TO MONITOR YOUR BLOOD PRESSURE AT HOME.  Labwork: none  Testing/Procedures: none  Follow-Up: Your physician recommends that you schedule a follow-up appointment in: June with Dr Burt Knack, his nurse will be in touch with an appointment   Any Other Special Instructions Will Be Listed Below (If Applicable). You are cleared from a cardiac standpoint for your upcoming surgery   If you need a refill on your cardiac medications before your next appointment, please call your pharmacy.

## 2016-02-03 NOTE — Assessment & Plan Note (Signed)
Followed by PCP, Type NIDDM

## 2016-02-09 ENCOUNTER — Other Ambulatory Visit: Payer: Self-pay | Admitting: Cardiovascular Disease

## 2016-02-11 ENCOUNTER — Other Ambulatory Visit: Payer: Self-pay

## 2016-02-11 DIAGNOSIS — Y929 Unspecified place or not applicable: Secondary | ICD-10-CM | POA: Diagnosis not present

## 2016-02-11 DIAGNOSIS — Y999 Unspecified external cause status: Secondary | ICD-10-CM | POA: Diagnosis not present

## 2016-02-11 DIAGNOSIS — S83232A Complex tear of medial meniscus, current injury, left knee, initial encounter: Secondary | ICD-10-CM | POA: Diagnosis not present

## 2016-02-11 DIAGNOSIS — X58XXXA Exposure to other specified factors, initial encounter: Secondary | ICD-10-CM | POA: Diagnosis not present

## 2016-02-11 DIAGNOSIS — M11262 Other chondrocalcinosis, left knee: Secondary | ICD-10-CM | POA: Diagnosis not present

## 2016-02-11 DIAGNOSIS — S83282A Other tear of lateral meniscus, current injury, left knee, initial encounter: Secondary | ICD-10-CM | POA: Diagnosis not present

## 2016-02-11 DIAGNOSIS — Y939 Activity, unspecified: Secondary | ICD-10-CM | POA: Diagnosis not present

## 2016-02-11 DIAGNOSIS — S83281D Other tear of lateral meniscus, current injury, right knee, subsequent encounter: Secondary | ICD-10-CM | POA: Diagnosis not present

## 2016-02-11 DIAGNOSIS — G8918 Other acute postprocedural pain: Secondary | ICD-10-CM | POA: Diagnosis not present

## 2016-02-11 DIAGNOSIS — S83241D Other tear of medial meniscus, current injury, right knee, subsequent encounter: Secondary | ICD-10-CM | POA: Diagnosis not present

## 2016-02-11 DIAGNOSIS — M179 Osteoarthritis of knee, unspecified: Secondary | ICD-10-CM | POA: Diagnosis not present

## 2016-02-11 MED ORDER — LOSARTAN POTASSIUM 50 MG PO TABS: 50.0000 mg | ORAL_TABLET | Freq: Every day | ORAL | Status: DC

## 2016-03-03 DIAGNOSIS — Z4789 Encounter for other orthopedic aftercare: Secondary | ICD-10-CM | POA: Diagnosis not present

## 2016-03-03 DIAGNOSIS — S83241D Other tear of medial meniscus, current injury, right knee, subsequent encounter: Secondary | ICD-10-CM | POA: Diagnosis not present

## 2016-03-07 ENCOUNTER — Other Ambulatory Visit: Payer: Self-pay | Admitting: Cardiovascular Disease

## 2016-03-07 DIAGNOSIS — I739 Peripheral vascular disease, unspecified: Secondary | ICD-10-CM

## 2016-03-14 ENCOUNTER — Ambulatory Visit (HOSPITAL_COMMUNITY)
Admission: RE | Admit: 2016-03-14 | Discharge: 2016-03-14 | Disposition: A | Discharge status: Home / Self Care | Payer: Medicare Other | Source: Ambulatory Visit | Attending: Cardiovascular Disease | Admitting: Cardiovascular Disease

## 2016-03-14 DIAGNOSIS — E1122 Type 2 diabetes mellitus with diabetic chronic kidney disease: Secondary | ICD-10-CM | POA: Insufficient documentation

## 2016-03-14 DIAGNOSIS — I708 Atherosclerosis of other arteries: Secondary | ICD-10-CM | POA: Insufficient documentation

## 2016-03-14 DIAGNOSIS — I739 Peripheral vascular disease, unspecified: Secondary | ICD-10-CM | POA: Insufficient documentation

## 2016-03-14 DIAGNOSIS — I130 Hypertensive heart and chronic kidney disease with heart failure and stage 1 through stage 4 chronic kidney disease, or unspecified chronic kidney disease: Secondary | ICD-10-CM | POA: Diagnosis not present

## 2016-03-14 DIAGNOSIS — I700 Atherosclerosis of aorta: Secondary | ICD-10-CM | POA: Insufficient documentation

## 2016-03-14 DIAGNOSIS — F329 Major depressive disorder, single episode, unspecified: Secondary | ICD-10-CM | POA: Diagnosis not present

## 2016-03-14 DIAGNOSIS — F419 Anxiety disorder, unspecified: Secondary | ICD-10-CM | POA: Diagnosis not present

## 2016-03-14 DIAGNOSIS — N189 Chronic kidney disease, unspecified: Secondary | ICD-10-CM | POA: Insufficient documentation

## 2016-03-14 DIAGNOSIS — E785 Hyperlipidemia, unspecified: Secondary | ICD-10-CM | POA: Diagnosis not present

## 2016-03-14 DIAGNOSIS — R938 Abnormal findings on diagnostic imaging of other specified body structures: Secondary | ICD-10-CM | POA: Insufficient documentation

## 2016-03-14 DIAGNOSIS — I5032 Chronic diastolic (congestive) heart failure: Secondary | ICD-10-CM | POA: Insufficient documentation

## 2016-03-14 DIAGNOSIS — K219 Gastro-esophageal reflux disease without esophagitis: Secondary | ICD-10-CM | POA: Insufficient documentation

## 2016-03-15 DIAGNOSIS — M25561 Pain in right knee: Secondary | ICD-10-CM | POA: Diagnosis not present

## 2016-03-16 ENCOUNTER — Encounter: Payer: Self-pay | Admitting: Cardiovascular Disease

## 2016-03-16 ENCOUNTER — Ambulatory Visit (INDEPENDENT_AMBULATORY_CARE_PROVIDER_SITE_OTHER): Payer: Medicare Other | Admitting: Cardiovascular Disease

## 2016-03-16 VITALS — BP 120/50 | HR 66 | Ht 64.0 in | Wt 148.0 lb

## 2016-03-16 DIAGNOSIS — I739 Peripheral vascular disease, unspecified: Secondary | ICD-10-CM | POA: Diagnosis not present

## 2016-03-16 NOTE — Patient Instructions (Addendum)
Medication Instructions:  Your physician has recommended you make the following change in your medication:  1. STOP Aspirin  Labwork: No new orders.   Testing/Procedures: Your physician has requested that you have a lower extremity arterial duplex in 1 YEAR. This test is an ultrasound of the arteries in the legs. It looks at arterial blood flow in the legs. Allow one hour for Lower  Arterial scans. There are no restrictions or special instructions  Your physician has requested that you have an ankle brachial index (ABI) in 1 YEAR. During this test an ultrasound and blood pressure cuff are used to evaluate the arteries that supply the arms and legs with blood. Allow thirty minutes for this exam. There are no restrictions or special instructions.  Your physician has requested that you have an aorto-iliac duplex in 1 YEAR. During this test, an ultrasound is used to evaluate the aorta and iliac arteries. Do not eat after midnight the day before and avoid carbonated beverages   Follow-Up: Your physician wants you to follow-up in: 1 YEAR with Dr Burt Knack.  You will receive a reminder letter in the mail two months in advance. If you don't receive a letter, please call our office to schedule the follow-up appointment.   Any Other Special Instructions Will Be Listed Below (If Applicable).     If you need a refill on your cardiac medications before your next appointment, please call your pharmacy.

## 2016-03-16 NOTE — Progress Notes (Signed)
Cardiology Office Note Date:  03/16/2016   ID:  Doris Carr, DOB Oct 21, 1935, MRN [id number]  PCP:   Melinda Crutch, MD  Cardiologist:  Sherren Mocha, MD    Chief Complaint  Patient presents with  . Hyperlipidemia  . Left Bundle Branch Block  . Diastolic CHF    History of Present Illness: Doris Carr is a 80 y.o. female who presents for follow-up evaluation. She's been followed for peripheral arterial disease and has undergone right iliac and left SFA stenting.   She fell during Yoga in January and tore a meniscus in her knee. She ultimately was treated with surgery in May and is still getting over it. She's walking with a cane.   No claudication symptoms. Today, she denies symptoms of palpitations, chest pain, shortness of breath, orthopnea, PND, lower extremity edema, dizziness, or syncope.  Past Medical History  Diagnosis Date  . Balance problems   . Syncope and collapse   . Left bundle branch block   . CKD (chronic kidney disease)     likely due to DM2 and HTN  . Lower extremity edema     CHRONIC  . Depression   . Anxiety   . Obesity   . 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 Hypertension     This has been difficult to control. She has had renal artery doppler evaluation on two different occasions, both times with no evidence for renal artery stenosis. She has had urinary catecholeamine collection that was unremarkable. Edema with 10 mg amlodipine.   . Peripheral vascular disease ([place])     Lower extremity evaluation (12/12): ABI 0.79 right, 0.64 left; > 50% mid left SFA stenosis;  s/p L SFA stent and stent to R CIA 11/2011; ABI's post PTA - R 0.90, L 0.96 (TBI on R 0.51, L 0.61)  . GERD (gastroesophageal reflux disease)   . Diabetes mellitus     type  2  . Lung nodule     VATS and biopsy in 2006 that was negative for malignancy  . S/P cardiac catheterization     Left heart cath in 6/06 with normal coronaries, EF 60%.  . Back pain     sees  a neurosurgeon  . HLD (hyperlipidemia)     myalgias with statins  . Chronic diastolic heart failure (HCC)     Echo (8/12) with EF 60-65%, moderate LV hypertrophy, mild LVH    Past Surgical History  Procedure Laterality Date  . Thoracotomy    . Cardiac catheterization  2006    NORMAL CORONARIES  . Cervical cone biopsy    . Thymic cyst removal    . Cataract extraction    . Tonsillectomy    . Abdominal aortogram  11/09/2011  . Gynecologic cryosurgery    . Vaginal hysterectomy  1985    WITH A/P REPAIR  . Stints in leg arteries    . Rectal ultrasound N/A 05/08/2013    Procedure: RECTAL ULTRASOUND;  Surgeon: Leighton Ruff, MD;  Location: [facility];  Service: Endoscopy;  Laterality: N/A;  . Flexible sigmoidoscopy N/A 05/08/2013    Procedure: FLEXIBLE SIGMOIDOSCOPY;  Surgeon: Leighton Ruff, MD;  Location: [facility];  Service: Endoscopy;  Laterality: N/A;  rigid proctoscope get from o.r.  . Anal rectal manometry N/A 05/08/2013    Procedure: ANAL RECTAL MANOMETRY;  Surgeon: Leighton Ruff, MD;  Location: [facility];  Service: Endoscopy;  Laterality: N/A;  . Abdominal aortagram N/A  11/09/2011    Procedure: ABDOMINAL Maxcine Ham;  Surgeon: Sherren Mocha, MD;  Location: [facility] CATH LAB;  Service: Cardiovascular;  Laterality: N/A;    Current Outpatient Prescriptions  Medication Sig Dispense Refill  . amLODipine (NORVASC) 5 MG tablet Take 1 tablet (5 mg total) by mouth daily. 30 tablet 11  . aspirin EC 81 MG tablet Take 1 tablet (81 mg total) by mouth daily.    . benzonatate (TESSALON) 200 MG capsule Take 200 mg by mouth 2 (two) times daily as needed. For cough    . carvedilol (COREG) 25 MG tablet Take 1 tablet by mouth daily  4  . chlorthalidone (HYGROTON) 25 MG tablet TAKE 1/2 TABLET BY MOUTH EVERY DAY 30 tablet 5  . citalopram (CELEXA) 20 MG tablet Take 20 mg by mouth daily.      . clonazePAM (KLONOPIN) 0.5 MG tablet Take 0.5 mg by mouth 2 (two) times daily as needed. 0.5 tab in the am, 1 tab in  the pm    . clopidogrel (PLAVIX) 75 MG tablet Take 1 tablet (75 mg total) by mouth daily. 90 tablet 2  . estropipate (OGEN) 1.5 MG tablet Take 1 tablet (1.5 mg total) by mouth daily. 30 tablet 12  . L-Methylfolate-B6-B12 (METANX) 3-35-2 MG TABS Take 1 tablet by mouth 2 (two) times daily.      Marland Kitchen losartan (COZAAR) 50 MG tablet Take 1 tablet (50 mg total) by mouth at bedtime. 90 tablet 3  . metFORMIN (GLUCOPHAGE-XR) 500 MG 24 hr tablet Take 500 mg by mouth daily as needed (high blood glucose).     . nitrofurantoin, macrocrystal-monohydrate, (MACROBID) 100 MG capsule Take 1 capsule (100 mg total) by mouth 2 (two) times daily. For 7 days 14 capsule 0  . traZODone (DESYREL) 50 MG tablet Take 25 mg by mouth at bedtime. HALF A PILL AT BEDTIME     No current facility-administered medications for this visit.    Allergies:   Ciprofloxacin; Codeine; Darvocet; Darvon; Hydralazine; Hydrocodone; Morphine and related; Pentazocine lactate; Potassium-containing compounds; Procaine hcl; and Sulfa drugs cross reactors   Social History:  The patient  reports that she quit smoking about 31 years ago. Her smoking use included Cigarettes. She has a 12 pack-year smoking history. She has never used smokeless tobacco. She reports that she does not drink alcohol or use illicit drugs.   Family History:  The patient's  family history includes Alzheimer's disease in her father and mother; Diabetes in her paternal grandfather; Heart disease in her mother; Hypertension in her mother.    ROS:  Please see the history of present illness.  Otherwise, review of systems is positive for back pain, easy bruising, constipation.  All other systems are reviewed and negative.    PHYSICAL EXAM: VS:  BP 120/50 mmHg  Pulse 66  Ht 5\' 4"  (1.626 m)  Wt 148 lb (67.132 kg)  BMI 25.39 kg/m2 , BMI Body mass index is 25.39 kg/(m^2). GEN: Well nourished, well developed, in no acute distress HEENT: normal Neck: no JVD, no masses. No carotid  bruits Cardiac: RRR without murmur or gallop                Respiratory:  clear to auscultation bilaterally, normal work of breathing GI: soft, nontender, nondistended, + BS MS: no deformity or atrophy Ext: no pretibial edema, pedal pulses 2+= bilaterally Skin: warm and dry, no rash Neuro:  Strength and sensation are intact Psych: euthymic mood, full affect  EKG:  EKG is not ordered  today.  Recent Labs: No results found for requested labs within last 365 days.   Lipid Panel     Component Value Date/Time   CHOL 228* 10/10/2014 1639   TRIG 164* 10/10/2014 1639   HDL 60 10/10/2014 1639   CHOLHDL 3.8 10/10/2014 1639   VLDL 33 10/10/2014 1639   LDLCALC 135* 10/10/2014 1639   LDLDIRECT 154.4 08/28/2013 1117      Wt Readings from Last 3 Encounters:  03/16/16 148 lb (67.132 kg)  02/03/16 152 lb (68.947 kg)  06/17/15 149 lb (67.586 kg)     Cardiac Studies Reviewed: Aortic Duplex: patent right iliac stent, >50% left iliac stenosis, normal caliber aorta  LEA Duplex: ABI 1.0 left, 0.85 right, patent left SFA stent   ASSESSMENT AND PLAN: 1.  PAD with intermittent claudication: essentially asymptomatic since SFA/iliac stenting. Will drop ASA and continue plavix considering her extensive bruising. Continue with risk reduction measures.  2. HTN: BP well-controlled on current Rx.    Current medicines are reviewed with the patient today.  The patient does not have concerns regarding medicines.  Labs/ tests ordered today include:  No orders of the defined types were placed in this encounter.    Disposition:   FU one year with repeat vascular studies  Signed, Sherren Mocha, MD  03/16/2016 2:33 PM    [address], [place], [place]  [address] Phone: [phone]; Fax: [phone]

## 2016-03-18 DIAGNOSIS — M25561 Pain in right knee: Secondary | ICD-10-CM | POA: Diagnosis not present

## 2016-03-21 DIAGNOSIS — M1711 Unilateral primary osteoarthritis, right knee: Secondary | ICD-10-CM | POA: Diagnosis not present

## 2016-03-28 DIAGNOSIS — M25561 Pain in right knee: Secondary | ICD-10-CM | POA: Diagnosis not present

## 2016-03-31 DIAGNOSIS — M25561 Pain in right knee: Secondary | ICD-10-CM | POA: Diagnosis not present

## 2016-04-04 DIAGNOSIS — M25561 Pain in right knee: Secondary | ICD-10-CM | POA: Diagnosis not present

## 2016-04-11 DIAGNOSIS — Z4789 Encounter for other orthopedic aftercare: Secondary | ICD-10-CM | POA: Diagnosis not present

## 2016-04-19 ENCOUNTER — Encounter: Payer: Self-pay | Admitting: *Deleted

## 2016-05-06 ENCOUNTER — Ambulatory Visit (INDEPENDENT_AMBULATORY_CARE_PROVIDER_SITE_OTHER): Payer: Medicare Other | Admitting: Cardiology

## 2016-05-06 VITALS — BP 116/58 | HR 64 | Ht 64.0 in | Wt 154.0 lb

## 2016-05-06 DIAGNOSIS — I739 Peripheral vascular disease, unspecified: Secondary | ICD-10-CM

## 2016-05-06 DIAGNOSIS — I447 Left bundle-branch block, unspecified: Secondary | ICD-10-CM | POA: Diagnosis not present

## 2016-05-06 DIAGNOSIS — I10 Essential (primary) hypertension: Secondary | ICD-10-CM

## 2016-05-06 LAB — CBC WITH DIFFERENTIAL/PLATELET
BASOS ABS: 57 {cells}/uL (ref 0–200)
Basophils Relative: 1 %
Eosinophils Absolute: 399 cells/uL (ref 15–500)
Eosinophils Relative: 7 %
HEMATOCRIT: 36.5 % (ref 35.0–45.0)
HEMOGLOBIN: 12.4 g/dL (ref 11.7–15.5)
LYMPHS ABS: 1254 {cells}/uL (ref 850–3900)
Lymphocytes Relative: 22 %
MCH: 31.6 pg (ref 27.0–33.0)
MCHC: 34 g/dL (ref 32.0–36.0)
MCV: 93.1 fL (ref 80.0–100.0)
MONO ABS: 627 {cells}/uL (ref 200–950)
MPV: 11.1 fL (ref 7.5–12.5)
Monocytes Relative: 11 %
NEUTROS ABS: 3363 {cells}/uL (ref 1500–7800)
NEUTROS PCT: 59 %
PLATELETS: 192 10*3/uL (ref 140–400)
RBC: 3.92 MIL/uL (ref 3.80–5.10)
RDW: 12.4 % (ref 11.0–15.0)
WBC: 5.7 10*3/uL (ref 3.8–10.8)

## 2016-05-06 LAB — BASIC METABOLIC PANEL
BUN: 26 mg/dL — AB (ref 7–25)
CHLORIDE: 95 mmol/L — AB (ref 98–110)
CO2: 28 mmol/L (ref 20–31)
CREATININE: 1.47 mg/dL — AB (ref 0.60–0.93)
Calcium: 9 mg/dL (ref 8.6–10.4)
Glucose, Bld: 228 mg/dL — ABNORMAL HIGH (ref 65–99)
Potassium: 3.8 mmol/L (ref 3.5–5.3)
Sodium: 135 mmol/L (ref 135–146)

## 2016-05-06 LAB — LIPID PANEL
Cholesterol: 255 mg/dL — ABNORMAL HIGH (ref 125–200)
HDL: 54 mg/dL (ref >=46)
LDL CALC: 146 mg/dL — AB (ref <130)
TRIGLYCERIDES: 276 mg/dL — AB (ref <150)
Total CHOL/HDL Ratio: 4.7 Ratio (ref <=5.0)
VLDL: 55 mg/dL — AB (ref <30)

## 2016-05-06 NOTE — Patient Instructions (Addendum)
Medication Instructions:  Your physician recommends that you continue on your current medications as directed. Please refer to the Current Medication list given to you today.   Labwork: Lipid profile/CBCd/BMET today  Testing/Procedures: none  Follow-Up: Your physician wants you to follow-up in: 6 months with Dr Burt Knack. (February 2018). You will receive a reminder letter in the mail two months in advance. If you don't receive a letter, please call our office to schedule the follow-up appointment.   Any Other Special Instructions Will Be Listed Below (If Applicable).  You have been referred to the [facility]   If you need a refill on your cardiac medications before your next appointment, please call your pharmacy.

## 2016-05-07 NOTE — Progress Notes (Signed)
Patient ID: Doris Carr, female   DOB: 09-Jun-1936, 80 y.o.   MRN: [id number] PCP: Dr. Harrington Carr  80 yo with history of CKD, diabetes, chronic LBBB, PAD, and difficult to control HTN presents for cardiology followup.  BP is now much better controlled.  She had a lower extremity angiogram in 2/13 with PTA and stenting of the left SFA and right CIA.  Renal arteries had no significant stenosis.  Most recent peripheral arterial dopplers in 6/17 showed > 50% left CIA stenosis.  She had right knee surgery for a meniscal tear earlier this year and has knee pain still but does not describe pain that is concerning for claudication.  Her legs do fatigue easily.   No exertional dyspnea, no chest pain.  She is not walking as much since her knee surgery.    She has been tried on multiple cholesterol meds and has not tolerated any.  She was seen in lipid clinic but did not get followup and is not on a PCSK-9 inhibitor.  SBP running in the 110s at home.   Labs (7/12): HgbA1c 5.9, K 3.8, creatinine 1.49, BUN 47 Labs (8/12): BNP 108, K 3.7, creatinine 1.3 => 1.4 Labs (12/12): K 4.5, creatinine 1.4 Labs (2/13): K 4.7, creatinine 0.96 Labs (6/13): K 4.7, creatinine 1.5, LDL 169 Labs (11/14): LDL 154 Labs (12/14): K 4.5, creatinine 1.5 Labs (5/15): K 4.4, creatinine 1.4 Labs (1/16): K 4, creatinine 1.26, LDL 135  ECG: NSR, LBBB  PMH: 1.  HTN: This has been difficult to control.  She has had renal artery doppler evaluation on two different occasions, both times with no evidence for renal artery stenosis.  She has had urinary catecholeamine collection that was unremarkable.  Edema with 10 mg amlodipine.  Renal artery angiogram in 2/13 showed no renal artery stenosis.  2. Diabetes mellitus type II: improved, no longer on medications.  3. CKD: Likely due to diabetes and HTN. 4. Chronic LBBB 5. Lung nodule with VATS and biopsy in 2006 that was negative for malignancy.  6. Depression 7. Anxiety 8. Obesity 9. Left heart  cath in 6/06 with normal coronaries, EF 60%.  10.  Back pain: Sees a neurosurgeon.  11.  Hyperlipidemia: Myalgias with statins.  She has take Crestor, Lipitor, pravastatin, and Zocor.  12.  Diastolic CHF: Echo (0000000) with EF 60-65%, moderate LV hypertrophy, mild LVH. 13.  PAD: Normal ABIs at [facility] in 6/11.  Lower extremity evaluation (12/12): ABI 0.79 right, 0.64 left; > 50% mid left SFA stenosis.  Peripheral angiogram in 2/13 with PTA and stenting of the left SFA and right CIA.  ABIs (5/14) 1.0 right/1.2 L; dopplers showed patent left SFA stent and moderate left CIA stenosis. ABIs (5/15) 0.88 right/0.96 left, left leg dopplers showed patent left SFA stent and aortoiliac dopplers showed patent right CIA stent and mild left CIA stenosis.  6/15 arterial dopplers showed > 50% distal right SFA stenosis. - Peripheral arterial dopplers (6/17): > 50% L CIA stenosis.    SH: Married Doris Carr), lives in [place], [place] children.  Nonsmoker.    FH: Doris Carr with pacemaker and Alzheimer's-type dementia.   ROS: All systems reviewed and negative except as per HPI.    Current Outpatient Prescriptions  Medication Sig Dispense Refill  . amLODipine (NORVASC) 5 MG tablet Take 1 tablet (5 mg total) by mouth daily. 30 tablet 11  . benzonatate (TESSALON) 200 MG capsule Take 200 mg by mouth 2 (two) times daily as needed. For cough    .  carvedilol (COREG) 25 MG tablet Take 1 tablet by mouth daily  4  . chlorthalidone (HYGROTON) 25 MG tablet TAKE 1/2 TABLET BY MOUTH EVERY DAY 30 tablet 5  . citalopram (CELEXA) 20 MG tablet Take 20 mg by mouth daily.      . clonazePAM (KLONOPIN) 0.5 MG tablet Take 0.5 mg by mouth 2 (two) times daily as needed. 0.5 tab in the am, 1 tab in the pm    . clopidogrel (PLAVIX) 75 MG tablet Take 1 tablet (75 mg total) by mouth daily. 90 tablet 2  . estropipate (OGEN) 1.5 MG tablet Take 1 tablet (1.5 mg total) by mouth daily. 30 tablet 12  . L-Methylfolate-B6-B12 (METANX) 3-35-2 MG TABS Take 1  tablet by mouth 2 (two) times daily.      Marland Kitchen losartan (COZAAR) 50 MG tablet Take 1 tablet (50 mg total) by mouth at bedtime. 90 tablet 3  . metFORMIN (GLUCOPHAGE-XR) 500 MG 24 hr tablet Take 500 mg by mouth daily as needed (high blood glucose).     . nitrofurantoin, macrocrystal-monohydrate, (MACROBID) 100 MG capsule Take 1 capsule (100 mg total) by mouth 2 (two) times daily. For 7 days 14 capsule 0  . traZODone (DESYREL) 50 MG tablet Take 25 mg by mouth at bedtime. HALF A PILL AT BEDTIME     No current facility-administered medications for this visit.     BP (!) 116/58   Pulse 64   Ht 5\' 4"  (1.626 m)   Wt 154 lb (69.9 kg) Comment: 150LB ON HOME SCALES  BMI 26.43 kg/m  General: NAD, overweight Neck: No JVD, no thyromegaly or thyroid nodule.  Lungs: Clear to auscultation bilaterally with normal respiratory effort. CV: Nondisplaced PMI.  Heart regular S1/S2, soft S4, no murmur.  No edema.  No carotid bruit.  Difficult to palpate PT pulses, no foot ulcerations. Bilateral lower leg venous varicosities.  Abdomen: Soft, nontender, no hepatosplenomegaly, no distention.   Neurologic: Alert and oriented x 3.  Psych: Normal affect. Extremities: No clubbing or cyanosis.   Assessment/Plan: 1. HTN: BP is well-controlled, In fact runs in the 100s-110s most of the time.  I think we can decrease her Coreg to 12.5 mg bid as this may help with fatigue.  She checks BP at home, if it runs high with this change she will restart 25 mg bid.  2. Hyperlipidemia: Has extensive PAD but unable to tolerate any statin, Zetia, or Niacin.  She has been on Crestor, Lipitor, pravastatin, and Zocor in the past.  Check lipids today.  I will refer her to lipid clinic again to try to get her started on Repatha.  I think that this is very important, hopefully we can get it done this time.   3. PAD: She had > 50% distal left CIA stenosis on last peripheral arterial dopplers but minimal symptoms currently.  Continue Plavix.  Follows with Dr Burt Knack.  Given my transition to full time at CHF clinic, I will have her followup with Dr Burt Knack in 6 months.    Loralie Champagne 05/07/2016

## 2016-05-14 DIAGNOSIS — Z4789 Encounter for other orthopedic aftercare: Secondary | ICD-10-CM | POA: Diagnosis not present

## 2016-05-17 ENCOUNTER — Ambulatory Visit (INDEPENDENT_AMBULATORY_CARE_PROVIDER_SITE_OTHER): Payer: Medicare Other | Admitting: Pharmacist

## 2016-05-17 DIAGNOSIS — E785 Hyperlipidemia, unspecified: Secondary | ICD-10-CM

## 2016-05-17 NOTE — Progress Notes (Signed)
HPI  Doris Carr is a 80 yo pt of Dr. Claris Gladden who was referred to the lipid clinic for history of statin intolerance.  She has an extensive history which includes HTN, DM, CKD, PAD s/p sent to the left SFA and cright CIA in 11/2011, and hyperlipidemia.  Reviewed records available back to 2006.  All of those records state pt is statin intolerance but unable to state which statin she has tried.  She states everything she did try caused her legs and arms to hurt.  Only other medication she recalls trying was fenofibrate and this was stopped in 2011 for unknown reasons.    RF: PAD, DM, HTN, age Goals: LDL <70, non-HDL <100 Current Meds: none Intolerance: statins (unsure of which ones or dose)- myalgias and "made her feel funny and lightheaded"  Diet: Breakfast- Cheerios with skim milk, fruit, or low fat yogurt, coffee with creamer.  Lunch- 1/2 sandwich and cup of soup.  Dinner- oatmeal, scrambled eggs, sandwich   Exercise: Had a fall doing Yoga in January that required knee surgery and has limited exercise this summer   Labs:  05/06/16: TC 255, TG 276, HDL 54, LDL 146 (no cholesterol meds) 10/2013: TC 228, TG 164, HDL 60, LDL 135 (no cholesterol meds)  Current Outpatient Prescriptions  Medication Sig Dispense Refill  . amLODipine (NORVASC) 5 MG tablet Take 1 tablet (5 mg total) by mouth daily. 30 tablet 11  . benzonatate (TESSALON) 200 MG capsule Take 200 mg by mouth 2 (two) times daily as needed. For cough    . carvedilol (COREG) 25 MG tablet Take 1 tablet by mouth daily  4  . chlorthalidone (HYGROTON) 25 MG tablet TAKE 1/2 TABLET BY MOUTH EVERY DAY 30 tablet 5  . citalopram (CELEXA) 20 MG tablet Take 20 mg by mouth daily.      . clonazePAM (KLONOPIN) 0.5 MG tablet Take 0.5 mg by mouth 2 (two) times daily as needed. 0.5 tab in the am, 1 tab in the pm    . clopidogrel (PLAVIX) 75 MG tablet Take 1 tablet (75 mg total) by mouth daily. 90 tablet 2  . estropipate (OGEN) 1.5 MG tablet Take 1 tablet (1.5  mg total) by mouth daily. 30 tablet 12  . L-Methylfolate-B6-B12 (METANX) 3-35-2 MG TABS Take 1 tablet by mouth 2 (two) times daily.      Marland Kitchen losartan (COZAAR) 50 MG tablet Take 1 tablet (50 mg total) by mouth at bedtime. 90 tablet 3  . metFORMIN (GLUCOPHAGE-XR) 500 MG 24 hr tablet Take 500 mg by mouth daily as needed (high blood glucose).     . nitrofurantoin, macrocrystal-monohydrate, (MACROBID) 100 MG capsule Take 1 capsule (100 mg total) by mouth 2 (two) times daily. For 7 days 14 capsule 0  . traZODone (DESYREL) 50 MG tablet Take 25 mg by mouth at bedtime. HALF A PILL AT BEDTIME     No current facility-administered medications for this visit.    Allergies  Allergen Reactions  . Ciprofloxacin Nausea And Vomiting  . Codeine Nausea And Vomiting  . Darvocet [Propoxyphene N-Acetaminophen] Nausea Only  . Darvon Nausea Only  . Hydralazine Other (See Comments)  . Hydrocodone Nausea And Vomiting  . Morphine And Related Nausea And Vomiting  . Pentazocine Lactate Nausea And Vomiting  . Potassium-Containing Compounds Nausea Only  . Procaine Hcl Nausea And Vomiting  . Sulfa Drugs Cross Reactors Rash   Assessment and Plan 1.  Hyperlipidemia-  Pt's LDL elevated with extensive PAD history.  She states  she has been intolerant to all statins.  We discussed options of PCSK-9 inhibitor versus clinical trial versus very low dose Crestor.  At this time she would like to see if she will qualify for a PCSK-9 inhibitor.  If not, will try other options.  Pt is agreeable with plan.

## 2016-05-19 ENCOUNTER — Telehealth: Payer: Self-pay | Admitting: *Deleted

## 2016-05-19 NOTE — Telephone Encounter (Signed)
Patient is not yet on Repatha. She was seen 2 days ago in lipid clinic and we are in the process of verifying coverage with her insurance. This can take a few weeks; we will send in a prescription pending insurance approval.

## 2016-05-23 ENCOUNTER — Ambulatory Visit: Payer: Medicare Other | Admitting: Cardiovascular Disease

## 2016-06-07 DIAGNOSIS — Z1231 Encounter for screening mammogram for malignant neoplasm of breast: Secondary | ICD-10-CM | POA: Diagnosis not present

## 2016-06-08 ENCOUNTER — Encounter: Payer: Self-pay | Admitting: Gynecology

## 2016-06-14 ENCOUNTER — Encounter: Payer: Self-pay | Admitting: Gynecology

## 2016-06-14 DIAGNOSIS — R928 Other abnormal and inconclusive findings on diagnostic imaging of breast: Secondary | ICD-10-CM | POA: Diagnosis not present

## 2016-06-14 DIAGNOSIS — R922 Inconclusive mammogram: Secondary | ICD-10-CM | POA: Diagnosis not present

## 2016-06-17 ENCOUNTER — Ambulatory Visit (INDEPENDENT_AMBULATORY_CARE_PROVIDER_SITE_OTHER): Payer: Medicare Other | Admitting: Gynecology

## 2016-06-17 ENCOUNTER — Encounter: Payer: Self-pay | Admitting: Gynecology

## 2016-06-17 VITALS — BP 120/76 | Ht 63.5 in | Wt 154.0 lb

## 2016-06-17 DIAGNOSIS — Z01419 Encounter for gynecological examination (general) (routine) without abnormal findings: Secondary | ICD-10-CM

## 2016-06-17 DIAGNOSIS — Z79890 Hormone replacement therapy (postmenopausal): Secondary | ICD-10-CM

## 2016-06-17 DIAGNOSIS — N816 Rectocele: Secondary | ICD-10-CM

## 2016-06-17 DIAGNOSIS — Z23 Encounter for immunization: Secondary | ICD-10-CM | POA: Diagnosis not present

## 2016-06-17 DIAGNOSIS — N952 Postmenopausal atrophic vaginitis: Secondary | ICD-10-CM

## 2016-06-17 MED ORDER — ESTROPIPATE 1.5 MG PO TABS: 1.5000 mg | ORAL_TABLET | Freq: Every day | ORAL | 12 refills | Status: DC

## 2016-06-17 NOTE — Addendum Note (Signed)
Addended by: Nelva Nay on: 06/17/2016 03:04 PM   Modules accepted: Orders

## 2016-06-17 NOTE — Progress Notes (Signed)
    Doris Carr 1936/01/08 [id number]        80 y.o.  G3P3003  for breast and pelvic exam.  Past medical history,surgical history, problem list, medications, allergies, family history and social history were all reviewed and documented as reviewed in the EPIC chart.  ROS:  Performed with pertinent positives and negatives included in the history, assessment and plan.   Additional significant findings :  None   Exam: [name] assistant Vitals:   06/17/16 1133  BP: 120/76  Weight: 154 lb (69.9 kg)  Height: 5' 3.5" (1.613 m)   Body mass index is 26.85 kg/m.  General appearance:  Normal affect, orientation and appearance. Skin: Grossly normal HEENT: Without gross lesions.  No cervical or supraclavicular adenopathy. Thyroid normal.  Lungs:  Clear without wheezing, rales or rhonchi Cardiac: RR, without RMG Abdominal:  Soft, nontender, without masses, guarding, rebound, organomegaly or hernia Breasts:  Examined lying and sitting without masses, retractions, discharge or axillary adenopathy. Pelvic:  Ext/BUS/Vagina with atrophic changes. First to second-degree rectocele. Cuff well supported. No significant cystocele  Adnexa without masses or tenderness    Anus and perineum normal   Rectovaginal normal sphincter tone without palpated masses or tenderness.    Assessment/Plan:  80 y.o. G24P3003 female for breast and pelvic exam.   1. Postmenopausal/atrophic genital changes/HRT. She continues taking one half of Ogen 1.25 mg daily. Status post TVH A&P repair in the past. I reviewed the most current 2017 NAMS HRT guidelines. Possible benefits as far as symptom relief, cardiovascular and bone health when started early versus risks to include stroke heart attack DVT impossible breast cancer. Possible increased risk with advancing age. Does have a history of peripheral vascular disease and the realistic risk of thrombosis discussed. Patient feels it is a quality of life issue and she feels  much better when she takes this and wants to continue. Refill 1 year provided. 2. Rectocele. I again reviewed her rectocele with her. At this point she is asymptomatic without pressure or stool trapping. Patient will monitor for symptoms and follow up if she develops any symptoms. 3. Pap smear 2011. No Pap smear done today. 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 Normal Pap smears afterwards. We both agree to stop screening and she is comfortable with this. 4. Mammography 06/2016. Continue with annual mammography when due. SBE monthly reviewed. 5. Sigmoidoscopy 2014. Repeat at their recommended interval. 6. DEXA 2015 was normal. Repeat at 5 year interval. 7. Health maintenance. No routine lab work done as patient reports this done elsewhere. Follow up 1 year, sooner as needed.  10 minutes of my time in excess of her breast and pelvic exam was spent in direct face to face counseling and coordination of care in regards to her issues of rectocele, HRT and review of most current guidelines.Anastasio Auerbach MD, 12:04 PM 06/17/2016

## 2016-06-17 NOTE — Patient Instructions (Signed)

## 2016-06-27 DIAGNOSIS — M1711 Unilateral primary osteoarthritis, right knee: Secondary | ICD-10-CM | POA: Diagnosis not present

## 2016-07-07 ENCOUNTER — Telehealth: Payer: Self-pay | Admitting: Pharmacist

## 2016-07-07 MED ORDER — EVOLOCUMAB 140 MG/ML ~~LOC~~ SOAJ: 1.0000 "pen " | SUBCUTANEOUS | 11 refills | Status: DC

## 2016-07-07 NOTE — Telephone Encounter (Addendum)
PA was submitted for Repatha 05/18/16. Had not heard back yet so called insurance company who stated that pt was approved through 11/18/16. They never sent [place] approval notification.  Sent rx to [facility]. LMOM for pt, will have her call if copay is too expensive.

## 2016-07-08 MED ORDER — EVOLOCUMAB 140 MG/ML ~~LOC~~ SOAJ: 1.0000 "pen " | SUBCUTANEOUS | 11 refills | Status: DC

## 2016-07-08 NOTE — Addendum Note (Signed)
Addended by: SUPPLE, MEGAN E on: 07/08/2016 01:39 PM   Modules accepted: Orders

## 2016-07-20 ENCOUNTER — Telehealth (HOSPITAL_COMMUNITY): Payer: Self-pay | Admitting: *Deleted

## 2016-07-20 NOTE — Telephone Encounter (Signed)
Rx was sent to [facility] and copay is $400 since patient has Medicare, she cannot afford Repatha.   Returned call from [facility] because someone had transferred her rx from [facility] to there? Their pharmacy is not any cheaper, advised pharmacist there that pt cannot afford Repatha so they wouldn't fill it.

## 2016-07-20 NOTE — Telephone Encounter (Signed)
Samantha left VM on our triage line stating [facility] can not fill pt's rx for Repatha and she is requesting an order to run through their pharmacy.  Repatha was ordered by [facility], message sent to them to address

## 2016-07-22 ENCOUNTER — Other Ambulatory Visit: Payer: Self-pay | Admitting: Cardiovascular Disease

## 2016-07-27 DIAGNOSIS — M1711 Unilateral primary osteoarthritis, right knee: Secondary | ICD-10-CM | POA: Diagnosis not present

## 2016-07-28 ENCOUNTER — Telehealth: Payer: Self-pay | Admitting: Pharmacist

## 2016-07-28 DIAGNOSIS — E785 Hyperlipidemia, unspecified: Secondary | ICD-10-CM

## 2016-07-28 MED ORDER — ROSUVASTATIN CALCIUM 5 MG PO TABS: 5.0000 mg | ORAL_TABLET | Freq: Every day | ORAL | 11 refills | Status: DC

## 2016-07-28 NOTE — Telephone Encounter (Signed)
Discussed Repatha copay with pt - $400 per month is cost prohibitive. Other options include rechallenging with low dose Crestor vs clinical trials. Pt is interested in trying Crestor again. Sent in rx for Crestor 5mg  daily with instructions for pt to titrate her own dose based on tolerability. Will follow up with lab work in 3 months. Pt will call clinic sooner with any questions or concerns.

## 2016-08-01 ENCOUNTER — Telehealth: Payer: Self-pay | Admitting: Pharmacist

## 2016-08-01 NOTE — Telephone Encounter (Signed)
Pt called to report side effects with low dose Crestor. She started Crestor 5mg  daily last Tuesday and started experiencing cramps on Saturday. She has been having trouble walking and getting dressed for church. Advised patient to stop taking Crestor. She has tried multiple other statins, Zetia, and fenofibrate.  Repatha injections were approved but cost prohibitive at $400 per month. Discussed clinical trials with patient and she is interested. Will route info to our research nurses to prescreen patient and touch base with her regarding potential eligibility.

## 2016-08-04 DIAGNOSIS — M1711 Unilateral primary osteoarthritis, right knee: Secondary | ICD-10-CM | POA: Diagnosis not present

## 2016-08-11 DIAGNOSIS — M1711 Unilateral primary osteoarthritis, right knee: Secondary | ICD-10-CM | POA: Diagnosis not present

## 2016-08-22 ENCOUNTER — Telehealth: Payer: Self-pay | Admitting: Cardiovascular Disease

## 2016-08-22 NOTE — Telephone Encounter (Signed)
Reviewed appointments and the pt is scheduled with research nurse on 08/30/16.  I spoke with [place] in research and she will contact the pt in regards to appointment.

## 2016-08-22 NOTE — Telephone Encounter (Signed)
CLEAR screening appointment set for 09/06/2016

## 2016-08-22 NOTE — Telephone Encounter (Signed)
Pt has an appt next Tuesday with somebody about her Lipid. She does not know how to get in contact with them. She needs to talk to them and she also wants to cancel her appt.She definitely wants to talk to somebody first.

## 2016-09-05 ENCOUNTER — Encounter: Payer: Self-pay | Admitting: Cardiovascular Disease

## 2016-09-05 NOTE — Telephone Encounter (Signed)
New message  Pt has recall for January w/Dr. Burt Knack  Please call and schedule if possible

## 2016-09-06 ENCOUNTER — Encounter: Payer: Self-pay | Admitting: Cardiovascular Disease

## 2016-09-12 ENCOUNTER — Encounter: Payer: Self-pay | Admitting: *Deleted

## 2016-09-12 DIAGNOSIS — Z006 Encounter for examination for normal comparison and control in clinical research program: Secondary | ICD-10-CM

## 2016-09-12 NOTE — Telephone Encounter (Signed)
This encounter was created in error - please disregard.

## 2016-09-16 ENCOUNTER — Telehealth: Payer: Self-pay | Admitting: *Deleted

## 2016-09-16 DIAGNOSIS — Z7984 Long term (current) use of oral hypoglycemic drugs: Secondary | ICD-10-CM | POA: Diagnosis not present

## 2016-09-16 DIAGNOSIS — E7800 Pure hypercholesterolemia, unspecified: Secondary | ICD-10-CM | POA: Diagnosis not present

## 2016-09-16 DIAGNOSIS — I10 Essential (primary) hypertension: Secondary | ICD-10-CM | POA: Diagnosis not present

## 2016-09-16 DIAGNOSIS — E1142 Type 2 diabetes mellitus with diabetic polyneuropathy: Secondary | ICD-10-CM | POA: Diagnosis not present

## 2016-09-16 DIAGNOSIS — G4700 Insomnia, unspecified: Secondary | ICD-10-CM | POA: Diagnosis not present

## 2016-09-16 DIAGNOSIS — F419 Anxiety disorder, unspecified: Secondary | ICD-10-CM | POA: Diagnosis not present

## 2016-09-16 DIAGNOSIS — Z0000 Encounter for general adult medical examination without abnormal findings: Secondary | ICD-10-CM | POA: Diagnosis not present

## 2016-09-20 DIAGNOSIS — M1711 Unilateral primary osteoarthritis, right knee: Secondary | ICD-10-CM | POA: Diagnosis not present

## 2016-10-13 ENCOUNTER — Telehealth: Payer: Self-pay | Admitting: Cardiovascular Disease

## 2016-10-13 NOTE — Telephone Encounter (Signed)
The pt is currently laying down and her spouse is suppose to take the message when I call back.  I made him aware that Dr Aundra Dubin has already signed off on the pt's clearance (filed under media tab).  Dr Burt Knack will also be able to review this information at the pt's upcoming appointment.

## 2016-10-13 NOTE — Telephone Encounter (Signed)
Ms. Neis called and would like to verify if Dr. Burt Knack is coordinating with Dr. Cay Schillings (could have misspelled name) for the knee surgery. Please call, thanks.

## 2016-10-13 NOTE — Telephone Encounter (Signed)
New Message      Does she have to have all this blood work done, she just had it done through the research department for cholesterol

## 2016-10-13 NOTE — Telephone Encounter (Signed)
I have sent a message to the Research department to determine if she needs lab work drawn in our office on 10/18/16.  I contacted the pt and advised her to hold off on having lab work checked at this time until I can determine what the recommendations is from research.  Pt agreed with plan.

## 2016-10-18 ENCOUNTER — Other Ambulatory Visit: Payer: Medicare Other | Admitting: *Deleted

## 2016-10-18 DIAGNOSIS — E7800 Pure hypercholesterolemia, unspecified: Secondary | ICD-10-CM

## 2016-10-18 NOTE — Telephone Encounter (Signed)
Reviewed chart and pt came into the office today and had lab work drawn. Pt cancelled 10/24/16 appointment with Dr Burt Knack.

## 2016-10-19 LAB — HEPATIC FUNCTION PANEL
ALBUMIN: 4 g/dL (ref 3.5–4.8)
ALT: 9 IU/L (ref 0–32)
AST: 14 IU/L (ref 0–40)
Alkaline Phosphatase: 47 IU/L (ref 39–117)
BILIRUBIN TOTAL: 0.2 mg/dL (ref 0.0–1.2)
BILIRUBIN, DIRECT: 0.09 mg/dL (ref 0.00–0.40)
TOTAL PROTEIN: 6 g/dL (ref 6.0–8.5)

## 2016-10-19 LAB — LIPID PANEL
CHOL/HDL RATIO: 4.7 ratio — AB (ref 0.0–4.4)
Cholesterol, Total: 229 mg/dL — ABNORMAL HIGH (ref 100–199)
HDL: 49 mg/dL (ref >39)
LDL Calculated: 100 mg/dL — ABNORMAL HIGH (ref 0–99)
Triglycerides: 398 mg/dL — ABNORMAL HIGH (ref 0–149)
VLDL Cholesterol Cal: 80 mg/dL — ABNORMAL HIGH (ref 5–40)

## 2016-10-24 ENCOUNTER — Ambulatory Visit: Payer: Medicare Other | Admitting: Cardiovascular Disease

## 2016-11-01 DIAGNOSIS — M1711 Unilateral primary osteoarthritis, right knee: Secondary | ICD-10-CM | POA: Diagnosis not present

## 2016-11-02 ENCOUNTER — Telehealth: Payer: Self-pay | Admitting: Cardiovascular Disease

## 2016-11-02 NOTE — Telephone Encounter (Signed)
New message     pt verbalized that she is calling for labs

## 2016-11-02 NOTE — Telephone Encounter (Signed)
Returned call to patient recent lab results given.Copy mailed to patient.

## 2016-11-07 ENCOUNTER — Other Ambulatory Visit (HOSPITAL_COMMUNITY): Payer: Self-pay | Admitting: Emergency Medicine

## 2016-11-07 NOTE — Progress Notes (Signed)
Medical clearance Dr Harrington Challenger 10-26-16 chart Cardio clearance Dr Aundra Dubin 10-11-16 chart LOV cardio Dr Aundra Dubin 05-06-16 epic  EKG 05-06-16 epic

## 2016-11-07 NOTE — Patient Instructions (Addendum)
Doris Carr  11/07/2016   Your procedure is scheduled on: 11-16-16  Report to [facility] Main  Entrance take [facility]  elevators to 3rd floor to  [place] at [phone].  Call this number if you have problems the morning of surgery [phone]   Remember: ONLY 1 PERSON MAY GO WITH YOU TO SHORT STAY TO GET  READY MORNING OF YOUR SURGERY.  Do not eat food or drink liquids :After Midnight.     Take these medicines the morning of surgery with A SIP OF WATER: Tylenol as needed, amlodipine(norvasc), carvedilol(coreg), clonazepam(klonopin), eye drops as needed   DO NOT TAKE ANY DIABETIC MEDICATIONS DAY OF YOUR SURGERY                               You may not have any metal on your body including hair pins and              piercings  Do not wear jewelry, make-up, lotions, powders or perfumes, deodorant             Do not wear nail polish.  Do not shave  48 hours prior to surgery.              Men may shave face and neck.   Do not bring valuables to the hospital. [place].  Contacts, dentures or bridgework may not be worn into surgery.  Leave suitcase in the car. After surgery it may be brought to your room.              Please read over the following fact sheets you were given: _____________________________________________________________________             How to Manage Your Diabetes Before and After Surgery  Why is it important to control my blood sugar before and after surgery? . Improving blood sugar levels before and after surgery helps healing and can limit problems. . A way of improving blood sugar control is eating a healthy diet by: o  Eating less sugar and carbohydrates o  Increasing activity/exercise o  Talking with your doctor about reaching your blood sugar goals . High blood sugars (greater than 180 mg/dL) can raise your risk of infections and slow your recovery, so you will need to focus on  controlling your diabetes during the weeks before surgery. . Make sure that the doctor who takes care of your diabetes knows about your planned surgery including the date and location.  How do I manage my blood sugar before surgery? . Check your blood sugar at least 4 times a day, starting 2 days before surgery, to make sure that the level is not too high or low. o Check your blood sugar the morning of your surgery when you wake up and every 2 hours until you get to the Short Stay unit. . If your blood sugar is less than 70 mg/dL, you will need to treat for low blood sugar: o Do not take insulin. o Treat a low blood sugar (less than 70 mg/dL) with  cup of clear juice (cranberry or apple), 4 glucose tablets, OR glucose gel. o Recheck blood sugar in 15 minutes after treatment (to make sure it is greater than 70 mg/dL).  If your blood sugar is not greater than 70 mg/dL on recheck, call [phone] for further instructions. . Report your blood sugar to the short stay nurse when you get to Short Stay.  . If you are admitted to the hospital after surgery: o Your blood sugar will be checked by the staff and you will probably be given insulin after surgery (instead of oral diabetes medicines) to make sure you have good blood sugar levels. o The goal for blood sugar control after surgery is 80-180 mg/dL.   WHAT DO I DO ABOUT MY DIABETES MEDICATION?  Marland Kitchen Do not take oral diabetes medicines (pills) the morning of surgery.  . THE NIGHT BEFORE SURGERY, take METFORMIN AS USUAL      . THE MORNING OF SURGERY, DO NOT TAKE YOUR METFORMIN    Reviewed and Endorsed by [place] Patient Education Committee, August 2015[facility] - Preparing for Surgery Before surgery, you can play an important role.  Because skin is not sterile, your skin needs to be as free of germs as possible.  You can reduce the number of germs on your skin by washing with CHG (chlorahexidine gluconate) soap before surgery.  CHG is an  antiseptic cleaner which kills germs and bonds with the skin to continue killing germs even after washing. Please DO NOT use if you have an allergy to CHG or antibacterial soaps.  If your skin becomes reddened/irritated stop using the CHG and inform your nurse when you arrive at Short Stay. Do not shave (including legs and underarms) for at least 48 hours prior to the first CHG shower.  You may shave your face/neck. Please follow these instructions carefully:  1.  Shower with CHG Soap the night before surgery and the  morning of Surgery.  2.  If you choose to wash your hair, wash your hair first as usual with your  normal  shampoo.  3.  After you shampoo, rinse your hair and body thoroughly to remove the  shampoo.                           4.  Use CHG as you would any other liquid soap.  You can apply chg directly  to the skin and wash                       Gently with a scrungie or clean washcloth.  5.  Apply the CHG Soap to your body ONLY FROM THE NECK DOWN.   Do not use on face/ open                           Wound or open sores. Avoid contact with eyes, ears mouth and genitals (private parts).                       Wash face,  Genitals (private parts) with your normal soap.             6.  Wash thoroughly, paying special attention to the area where your surgery  will be performed.  7.  Thoroughly rinse your body with warm water from the neck down.  8.  DO NOT shower/wash with your normal soap after using and rinsing off  the CHG Soap.                9.  Pat yourself dry with a clean  towel.            10.  Wear clean pajamas.            11.  Place clean sheets on your bed the night of your first shower and do not  sleep with pets. Day of Surgery : Do not apply any lotions/deodorants the morning of surgery.  Please wear clean clothes to the hospital/surgery center.  FAILURE TO FOLLOW THESE INSTRUCTIONS MAY RESULT IN THE CANCELLATION OF YOUR SURGERY PATIENT  SIGNATURE_________________________________  NURSE SIGNATURE__________________________________  ________________________________________________________________________   Doris Carr  An incentive spirometer is a tool that can help keep your lungs clear and active. This tool measures how well you are filling your lungs with each breath. Taking long deep breaths may help reverse or decrease the chance of developing breathing (pulmonary) problems (especially infection) following:  A long period of time when you are unable to move or be active. BEFORE THE PROCEDURE   If the spirometer includes an indicator to show your best effort, your nurse or respiratory therapist will set it to a desired goal.  If possible, sit up straight or lean slightly forward. Try not to slouch.  Hold the incentive spirometer in an upright position. INSTRUCTIONS FOR USE  1. Sit on the edge of your bed if possible, or sit up as far as you can in bed or on a chair. 2. Hold the incentive spirometer in an upright position. 3. Breathe out normally. 4. Place the mouthpiece in your mouth and seal your lips tightly around it. 5. Breathe in slowly and as deeply as possible, raising the piston or the ball toward the top of the column. 6. Hold your breath for 3-5 seconds or for as long as possible. Allow the piston or ball to fall to the bottom of the column. 7. Remove the mouthpiece from your mouth and breathe out normally. 8. Rest for a few seconds and repeat Steps 1 through 7 at least 10 times every 1-2 hours when you are awake. Take your time and take a few normal breaths between deep breaths. 9. The spirometer may include an indicator to show your best effort. Use the indicator as a goal to work toward during each repetition. 10. After each set of 10 deep breaths, practice coughing to be sure your lungs are clear. If you have an incision (the cut made at the time of surgery), support your incision when coughing  by placing a pillow or rolled up towels firmly against it. Once you are able to get out of bed, walk around indoors and cough well. You may stop using the incentive spirometer when instructed by your caregiver.  RISKS AND COMPLICATIONS  Take your time so you do not get dizzy or light-headed.  If you are in pain, you may need to take or ask for pain medication before doing incentive spirometry. It is harder to take a deep breath if you are having pain. AFTER USE  Rest and breathe slowly and easily.  It can be helpful to keep track of a log of your progress. Your caregiver can provide you with a simple table to help with this. If you are using the spirometer at home, follow these instructions: [place] IF:   You are having difficultly using the spirometer.  You have trouble using the spirometer as often as instructed.  Your pain medication is not giving enough relief while using the spirometer.  You develop fever of 100.5 F (38.1 C) or  higher. SEEK IMMEDIATE MEDICAL CARE IF:   You cough up bloody sputum that had not been present before.  You develop fever of 102 F (38.9 C) or greater.  You develop worsening pain at or near the incision site. MAKE SURE YOU:   Understand these instructions.  Will watch your condition.  Will get help right away if you are not doing well or get worse. Document Released: 01/30/2007 Document Revised: 12/12/2011 Document Reviewed: 04/02/2007 Exit[employer] Patient Information 2014 [employer], [place].   ________________________________________________________________________  WHAT IS A BLOOD TRANSFUSION? Blood Transfusion Information  A transfusion is the replacement of blood or some of its parts. Blood is made up of multiple cells which provide different functions.  Red blood cells carry oxygen and are used for blood loss replacement.  White blood cells fight against infection.  Platelets control bleeding.  Plasma helps clot  blood.  Other blood products are available for specialized needs, such as hemophilia or other clotting disorders. BEFORE THE TRANSFUSION  Who gives blood for transfusions?   Healthy volunteers who are fully evaluated to make sure their blood is safe. This is blood bank blood. Transfusion therapy is the safest it has ever been in the practice of medicine. Before blood is taken from a donor, a complete history is taken to make sure that person has no history of diseases nor engages in risky social behavior (examples are intravenous drug use or sexual activity with multiple partners). The donor's travel history is screened to minimize risk of transmitting infections, such as malaria. The donated blood is tested for signs of infectious diseases, such as HIV and hepatitis. The blood is then tested to be sure it is compatible with you in order to minimize the chance of a transfusion reaction. If you or a relative donates blood, this is often done in anticipation of surgery and is not appropriate for emergency situations. It takes many days to process the donated blood. RISKS AND COMPLICATIONS Although transfusion therapy is very safe and saves many lives, the main dangers of transfusion include:   Getting an infectious disease.  Developing a transfusion reaction. This is an allergic reaction to something in the blood you were given. Every precaution is taken to prevent this. The decision to have a blood transfusion has been considered carefully by your caregiver before blood is given. Blood is not given unless the benefits outweigh the risks. AFTER THE TRANSFUSION  Right after receiving a blood transfusion, you will usually feel much better and more energetic. This is especially true if your red blood cells have gotten low (anemic). The transfusion raises the level of the red blood cells which carry oxygen, and this usually causes an energy increase.  The nurse administering the transfusion will monitor  you carefully for complications. HOME CARE INSTRUCTIONS  No special instructions are needed after a transfusion. You may find your energy is better. Speak with your caregiver about any limitations on activity for underlying diseases you may have. SEEK MEDICAL CARE IF:   Your condition is not improving after your transfusion.  You develop redness or irritation at the intravenous (IV) site. SEEK IMMEDIATE MEDICAL CARE IF:  Any of the following symptoms occur over the next 12 hours:  Shaking chills.  You have a temperature by mouth above 102 F (38.9 C), not controlled by medicine.  Chest, back, or muscle pain.  People around you feel you are not acting correctly or are confused.  Shortness of breath or difficulty breathing.  Dizziness and fainting.  You get a rash or develop hives.  You have a decrease in urine output.  Your urine turns a dark color or changes to pink, red, or brown. Any of the following symptoms occur over the next 10 days:  You have a temperature by mouth above 102 F (38.9 C), not controlled by medicine.  Shortness of breath.  Weakness after normal activity.  The white part of the eye turns yellow (jaundice).  You have a decrease in the amount of urine or are urinating less often.  Your urine turns a dark color or changes to pink, red, or brown. Document Released: 09/16/2000 Document Revised: 12/12/2011 Document Reviewed: 05/05/2008 [facility] Patient Information 2014 [place], [place].  _______________________________________________________________________

## 2016-11-08 ENCOUNTER — Encounter (HOSPITAL_COMMUNITY)
Admission: RE | Admit: 2016-11-08 | Discharge: 2016-11-08 | Disposition: A | Discharge status: Home / Self Care | Payer: Medicare Other | Source: Ambulatory Visit | Attending: Orthopedic Surgery | Admitting: Orthopedic Surgery

## 2016-11-08 ENCOUNTER — Encounter (HOSPITAL_COMMUNITY): Payer: Self-pay

## 2016-11-08 ENCOUNTER — Ambulatory Visit (HOSPITAL_COMMUNITY)
Admission: RE | Admit: 2016-11-08 | Discharge: 2016-11-08 | Disposition: A | Discharge status: Home / Self Care | Payer: Medicare Other | Source: Ambulatory Visit | Attending: Surgical | Admitting: Surgical

## 2016-11-08 DIAGNOSIS — Z01818 Encounter for other preprocedural examination: Secondary | ICD-10-CM | POA: Diagnosis not present

## 2016-11-08 DIAGNOSIS — Z471 Aftercare following joint replacement surgery: Secondary | ICD-10-CM | POA: Diagnosis not present

## 2016-11-08 DIAGNOSIS — M1711 Unilateral primary osteoarthritis, right knee: Secondary | ICD-10-CM | POA: Insufficient documentation

## 2016-11-08 DIAGNOSIS — Z96659 Presence of unspecified artificial knee joint: Secondary | ICD-10-CM | POA: Diagnosis not present

## 2016-11-08 HISTORY — DX: Other specified postprocedural states: Z98.890

## 2016-11-08 HISTORY — DX: Personal history of other diseases of the digestive system: Z87.19

## 2016-11-08 HISTORY — DX: Unspecified tear of unspecified meniscus, current injury, unspecified knee, initial encounter: S83.209A

## 2016-11-08 HISTORY — DX: Pneumonia, unspecified organism: J18.9

## 2016-11-08 HISTORY — DX: Nausea with vomiting, unspecified: R11.2

## 2016-11-08 HISTORY — DX: Unspecified osteoarthritis, unspecified site: M19.90

## 2016-11-08 LAB — CBC WITH DIFFERENTIAL/PLATELET
Basophils Absolute: 0 10*3/uL (ref 0.0–0.1)
Basophils Relative: 1 %
Eosinophils Absolute: 0.4 10*3/uL (ref 0.0–0.7)
Eosinophils Relative: 6 %
HCT: 36.4 % (ref 36.0–46.0)
Hemoglobin: 12.6 g/dL (ref 12.0–15.0)
Lymphocytes Relative: 19 %
Lymphs Abs: 1.2 10*3/uL (ref 0.7–4.0)
MCH: 31.9 pg (ref 26.0–34.0)
MCHC: 34.6 g/dL (ref 30.0–36.0)
MCV: 92.2 fL (ref 78.0–100.0)
Monocytes Absolute: 0.4 10*3/uL (ref 0.1–1.0)
Monocytes Relative: 7 %
Neutro Abs: 4.2 10*3/uL (ref 1.7–7.7)
Neutrophils Relative %: 67 %
Platelets: 165 10*3/uL (ref 150–400)
RBC: 3.95 MIL/uL (ref 3.87–5.11)
RDW: 12 % (ref 11.5–15.5)
WBC: 6.2 10*3/uL (ref 4.0–10.5)

## 2016-11-08 LAB — COMPREHENSIVE METABOLIC PANEL
ALT: 11 U/L — ABNORMAL LOW (ref 14–54)
AST: 17 U/L (ref 15–41)
Albumin: 4.3 g/dL (ref 3.5–5.0)
Alkaline Phosphatase: 44 U/L (ref 38–126)
Anion gap: 9 (ref 5–15)
BUN: 31 mg/dL — ABNORMAL HIGH (ref 6–20)
CO2: 29 mmol/L (ref 22–32)
Calcium: 9.8 mg/dL (ref 8.9–10.3)
Chloride: 96 mmol/L — ABNORMAL LOW (ref 101–111)
Creatinine, Ser: 1.15 mg/dL — ABNORMAL HIGH (ref 0.44–1.00)
GFR calc Af Amer: 51 mL/min — ABNORMAL LOW (ref >60)
GFR calc non Af Amer: 44 mL/min — ABNORMAL LOW (ref >60)
Glucose, Bld: 160 mg/dL — ABNORMAL HIGH (ref 65–99)
Potassium: 3.9 mmol/L (ref 3.5–5.1)
Sodium: 134 mmol/L — ABNORMAL LOW (ref 135–145)
Total Bilirubin: 0.5 mg/dL (ref 0.3–1.2)
Total Protein: 6.9 g/dL (ref 6.5–8.1)

## 2016-11-08 LAB — SURGICAL PCR SCREEN
MRSA, PCR: NEGATIVE
STAPHYLOCOCCUS AUREUS: NEGATIVE

## 2016-11-08 LAB — URINALYSIS, ROUTINE W REFLEX MICROSCOPIC
Bilirubin Urine: NEGATIVE
Glucose, UA: NEGATIVE mg/dL
Hgb urine dipstick: NEGATIVE
Ketones, ur: 5 mg/dL — AB
Leukocytes, UA: NEGATIVE
Nitrite: NEGATIVE
Protein, ur: NEGATIVE mg/dL
Specific Gravity, Urine: 1.025 (ref 1.005–1.030)
pH: 5 (ref 5.0–8.0)

## 2016-11-08 LAB — PROTIME-INR
INR: 0.99
Prothrombin Time: 13.1 seconds (ref 11.4–15.2)

## 2016-11-08 LAB — APTT: aPTT: 29 seconds (ref 24–36)

## 2016-11-08 LAB — ABO/RH: ABO/RH(D): A POS

## 2016-11-08 LAB — GLUCOSE, CAPILLARY: Glucose-Capillary: 182 mg/dL — ABNORMAL HIGH (ref 65–99)

## 2016-11-08 NOTE — Progress Notes (Signed)
CMP and UA results routed via epic to Dr Gladstone Lighter

## 2016-11-09 NOTE — H&P (Signed)
TOTAL KNEE ADMISSION H&P  Patient is being admitted for right total knee arthroplasty.  Subjective:  Chief Complaint:right knee pain.  HPI: Doris Carr, 81 y.o. female, has a history of pain and functional disability in the right knee due to arthritis and has failed non-surgical conservative treatments for greater than 12 weeks to includeNSAID's and/or analgesics, corticosteriod injections, flexibility and strengthening excercises and activity modification.  Onset of symptoms was gradual, starting 2 years ago with gradually worsening course since that time. The patient noted prior procedures on the knee to include  arthroscopy and menisectomy on the right knee(s).  Patient currently rates pain in the right knee(s) at 8 out of 10 with activity. Patient has night pain, worsening of pain with activity and weight bearing, pain that interferes with activities of daily living, pain with passive range of motion, crepitus and joint swelling.  Patient has evidence of periarticular osteophytes and joint space narrowing by imaging studies. There is no active infection.  Patient Active Problem List   Diagnosis Date Noted  . Pre-operative cardiovascular examination 02/03/2016  . Normal coronary arteries  02/03/2016  . Fecal incontinence 05/14/2013  . PVD (peripheral vascular disease) ([place]) 09/14/2011  . Hyperlipidemia 09/14/2011  . Diastolic CHF, chronic ([place]) 06/12/2011  . Leg pain 05/14/2011  . HTN (hypertension) 01/10/2011  . Balance problems   . Diabetes mellitus with nephropathy ([place])   . Syncope and collapse   . Left bundle branch block   . Chronic renal insufficiency, stage III (moderate)   . Lower extremity edema   . Depression   . Anxiety   . Obesity    Past Medical History:  Diagnosis Date  . Anxiety   . Arthritis    osteoarthritis  . Back pain    sees a neurosurgeon  . Balance problems   . Chronic diastolic heart failure (HCC)    Echo (8/12) with EF 60-65%, moderate LV  hypertrophy, mild LVH  . 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 CKD (chronic kidney disease)    likely due to DM2 and HTN  . Current tear of meniscus right knee  . Depression   . Diabetes mellitus    type  2  . GERD (gastroesophageal reflux disease)   . History of hiatal hernia    inguinal hernia  . HLD (hyperlipidemia)    myalgias with statins  . Hypertension    This has been difficult to control. She has had renal artery doppler evaluation on two different occasions, both times with no evidence for renal artery stenosis. She has had urinary catecholeamine collection that was unremarkable. Edema with 10 mg amlodipine.   . Left bundle branch block   . Lower extremity edema    CHRONIC  . Lung nodule    VATS and biopsy in 2006 that was negative for malignancy  . Obesity   . Peripheral vascular disease ([place])    Lower extremity evaluation (12/12): ABI 0.79 right, 0.64 left; > 50% mid left SFA stenosis;  s/p L SFA stent and stent to R CIA 11/2011; ABI's post PTA - R 0.90, L 0.96 (TBI on R 0.51, L 0.61)  . Pneumonia    multiple times in childhood  . PONV (postoperative nausea and vomiting)   . S/P cardiac catheterization    Left heart cath in 6/06 with normal coronaries, EF 60%.  . Syncope and collapse     Past Surgical History:  Procedure Laterality Date  .  ABDOMINAL AORTAGRAM N/A 11/09/2011   Procedure: ABDOMINAL Maxcine Ham;  Surgeon: Sherren Mocha, MD;  Location: [facility] CATH LAB;  Service: Cardiovascular;  Laterality: N/A;  . abdominal aortogram  11/09/2011  . ANAL RECTAL MANOMETRY N/A 05/08/2013   Procedure: ANAL RECTAL MANOMETRY;  Surgeon: Leighton Ruff, MD;  Location: [facility] ENDOSCOPY;  Service: Endoscopy;  Laterality: N/A;  . CARDIAC CATHETERIZATION  2006   NORMAL CORONARIES  . CATARACT EXTRACTION    . CERVICAL CONE BIOPSY    . FLEXIBLE SIGMOIDOSCOPY N/A 05/08/2013   Procedure: FLEXIBLE SIGMOIDOSCOPY;  Surgeon: Leighton Ruff, MD;  Location: [facility] ENDOSCOPY;   Service: Endoscopy;  Laterality: N/A;  rigid proctoscope get from o.r.  . GYNECOLOGIC CRYOSURGERY    . Knee Surg     Arthroscopic  . RECTAL ULTRASOUND N/A 05/08/2013   Procedure: RECTAL ULTRASOUND;  Surgeon: Leighton Ruff, MD;  Location: [facility] ENDOSCOPY;  Service: Endoscopy;  Laterality: N/A;  . Stints in leg arteries    . THORACOTOMY    . THYMIC CYST REMOVAL    . TONSILLECTOMY    . VAGINAL HYSTERECTOMY  1985   WITH A/P REPAIR     Current Outpatient Prescriptions:  .  acetaminophen (TYLENOL) 650 MG CR tablet, Take 650 mg by mouth 2 (two) times daily as needed (for arthritis pain.)., Disp: , Rfl:  .  amLODipine (NORVASC) 5 MG tablet, Take 1 tablet (5 mg total) by mouth daily. (Patient taking differently: Take 5 mg by mouth 2 (two) times daily. ), Disp: 30 tablet, Rfl: 11 .  carvedilol (COREG) 25 MG tablet, Take 25 mg by mouth 2 (two) times daily., Disp: , Rfl:  .  chlorthalidone (HYGROTON) 25 MG tablet, TAKE 1/2 TABLET BY MOUTH EVERY DAY (Patient taking differently: TAKE 1 TABLET (25 MG) BY MOUTH EVERY MORNING), Disp: 30 tablet, Rfl: 5 .  Cholecalciferol (VITAMIN D3 PO), Take 1 tablet by mouth daily., Disp: , Rfl:  .  citalopram (CELEXA) 20 MG tablet, Take 20 mg by mouth daily.  , Disp: , Rfl:  .  clonazePAM (KLONOPIN) 0.5 MG tablet, Take 0.25-0.5 mg by mouth 2 (two) times daily. 0.25 mg in the morning, 0.5 mg in the evening, Disp: , Rfl:  .  clopidogrel (PLAVIX) 75 MG tablet, TAKE 1 TABLET(75 MG) BY MOUTH DAILY (Patient not taking: Reported on 11/08/2016), Disp: 90 tablet, Rfl: 2 .  Cyanocobalamin (VITAMIN B-12 PO), Take 1 tablet by mouth daily., Disp: , Rfl:  .  estropipate (OGEN) 1.5 MG tablet, Take 1 tablet (1.5 mg total) by mouth daily., Disp: 30 tablet, Rfl: 12 .  L-Methylfolate-B6-B12 (METANX) 3-35-2 MG TABS, Take 1 tablet by mouth daily. , Disp: , Rfl:  .  losartan (COZAAR) 50 MG tablet, Take 1 tablet (50 mg total) by mouth at bedtime., Disp: 90 tablet, Rfl: 3 .  metFORMIN (GLUCOPHAGE) 500  MG tablet, Take 500 mg by mouth at bedtime., Disp: , Rfl: 4 .  tetrahydrozoline 0.05 % ophthalmic solution, Place 1-2 drops into both eyes 2 (two) times daily as needed (for dry eyes.)., Disp: , Rfl:  .  traZODone (DESYREL) 50 MG tablet, Take 50 mg by mouth at bedtime. HALF A PILL AT BEDTIME, Disp: , Rfl:   Allergies  Allergen Reactions  . Ciprofloxacin Nausea And Vomiting  . Codeine Nausea And Vomiting  . Darvocet [Propoxyphene N-Acetaminophen] Nausea Only  . Darvon Nausea Only  . Hydralazine Other (See Comments)    Unsure of reaction  . Hydrocodone Nausea And Vomiting  . Morphine And Related Nausea And  Vomiting  . Pentazocine Lactate Nausea And Vomiting  . Potassium-Containing Compounds Nausea Only  . Procaine Hcl Nausea And Vomiting  . Sulfa Drugs Cross Reactors Rash    Social History  Substance Use Topics  . Smoking status: Former Smoker    Packs/day: 0.80    Years: 15.00    Types: Cigarettes    Quit date: 11/03/1984  . Smokeless tobacco: Never Used  . Alcohol use No    Family History  Problem Relation Age of Onset  . Alzheimer's disease Mother   . Hypertension Mother   . Heart disease Mother   . Alzheimer's disease Father   . Diabetes Paternal Grandfather      Review of Systems  Constitutional: Negative.   HENT: Negative.   Eyes: Negative.   Respiratory: Negative.   Cardiovascular: Negative.   Gastrointestinal: Negative.   Genitourinary: Negative.   Musculoskeletal: Positive for joint pain and myalgias. Negative for back pain, falls and neck pain.  Skin: Negative.   Neurological: Negative.   Endo/Heme/Allergies: Negative.   Psychiatric/Behavioral: Negative.     Objective:  Physical Exam  Constitutional: She is oriented to person, place, and time. She appears well-developed and well-nourished. No distress.  HENT:  Head: Normocephalic and atraumatic.  Right Ear: External ear normal.  Left Ear: External ear normal.  Nose: Nose normal.  Mouth/Throat:  Oropharynx is clear and moist.  Eyes: Conjunctivae and EOM are normal.  Neck: Normal range of motion. Neck supple.  Cardiovascular: Normal rate, regular rhythm, normal heart sounds and intact distal pulses.   No murmur heard. Respiratory: Effort normal and breath sounds normal. No respiratory distress.  GI: Soft. Bowel sounds are normal. She exhibits no distension.  Musculoskeletal:       Right hip: Normal.       Left hip: Normal.       Right knee: She exhibits decreased range of motion and swelling. She exhibits no effusion and no erythema. Tenderness found. Medial joint line and lateral joint line tenderness noted.       Left knee: Normal.  Neurological: She is alert and oriented to person, place, and time. She has normal strength. No sensory deficit.  Skin: No rash noted. She is not diaphoretic. No erythema.  Psychiatric: She has a normal mood and affect. Her behavior is normal.    Vitals  Weight: 154 lb Height: 64in Body Surface Area: 1.75 m Body Mass Index: 26.43 kg/m  BP: 115/70 (Sitting, Left Arm, Standard) HR: 72 bpm  Imaging Review Plain radiographs demonstrate severe degenerative joint disease of the right knee(s). The overall alignment ismild varus. The bone quality appears to be good for age and reported activity level.  Assessment/Plan:  End stage primary osteoarthritis, right knee   The patient history, physical examination, clinical judgment of the provider and imaging studies are consistent with end stage degenerative joint disease of the right knee(s) and total knee arthroplasty is deemed medically necessary. The treatment options including medical management, injection therapy arthroscopy and arthroplasty were discussed at length. The risks and benefits of total knee arthroplasty were presented and reviewed. The risks due to aseptic loosening, infection, stiffness, patella tracking problems, thromboembolic complications and other imponderables were discussed.  The patient acknowledged the explanation, agreed to proceed with the plan and consent was signed. Patient is being admitted for inpatient treatment for surgery, pain control, PT, OT, prophylactic antibiotics, VTE prophylaxis, progressive ambulation and ADL's and discharge planning. The patient is planning to be discharged home with outpatient therapy.  PCP: Dr. Melinda Crutch Cardio: Dr. Sherren Mocha; Dr. Loralie Champagne Therapy Plans: outpatient therapy at [facility] starting 2/19 Home with husband and son   Ardeen Jourdain, [place]

## 2016-11-15 MED ORDER — TRANEXAMIC ACID 1000 MG/10ML IV SOLN
2000.0000 mg | Freq: Once | INTRAVENOUS | Status: DC
  Filled 2016-11-15: qty 20

## 2016-11-15 MED ORDER — BUPIVACAINE LIPOSOME 1.3 % IJ SUSP
20.0000 mL | Freq: Once | INTRAMUSCULAR | Status: DC
  Filled 2016-11-15: qty 20

## 2016-11-16 ENCOUNTER — Inpatient Hospital Stay (HOSPITAL_COMMUNITY): Payer: Medicare Other | Admitting: Anesthesiology

## 2016-11-16 ENCOUNTER — Encounter (HOSPITAL_COMMUNITY): Payer: Self-pay | Admitting: *Deleted

## 2016-11-16 ENCOUNTER — Inpatient Hospital Stay (HOSPITAL_COMMUNITY)
Admission: RE | Admit: 2016-11-16 | Discharge: 2016-11-17 | DRG: 470 | Disposition: A | Discharge status: Home / Self Care | Payer: Medicare Other | Source: Ambulatory Visit | Attending: Orthopedic Surgery | Admitting: Orthopedic Surgery

## 2016-11-16 ENCOUNTER — Encounter (HOSPITAL_COMMUNITY)
Admission: RE | Disposition: A | Discharge status: Home / Self Care | Payer: Self-pay | Source: Ambulatory Visit | Attending: Orthopedic Surgery

## 2016-11-16 DIAGNOSIS — Z8249 Family history of ischemic heart disease and other diseases of the circulatory system: Secondary | ICD-10-CM | POA: Diagnosis not present

## 2016-11-16 DIAGNOSIS — Z87891 Personal history of nicotine dependence: Secondary | ICD-10-CM

## 2016-11-16 DIAGNOSIS — E785 Hyperlipidemia, unspecified: Secondary | ICD-10-CM | POA: Diagnosis present

## 2016-11-16 DIAGNOSIS — Z9849 Cataract extraction status, unspecified eye: Secondary | ICD-10-CM

## 2016-11-16 DIAGNOSIS — G8918 Other acute postprocedural pain: Secondary | ICD-10-CM | POA: Diagnosis not present

## 2016-11-16 DIAGNOSIS — I5032 Chronic diastolic (congestive) heart failure: Secondary | ICD-10-CM | POA: Diagnosis present

## 2016-11-16 DIAGNOSIS — Z885 Allergy status to narcotic agent status: Secondary | ICD-10-CM

## 2016-11-16 DIAGNOSIS — E876 Hypokalemia: Secondary | ICD-10-CM | POA: Diagnosis not present

## 2016-11-16 DIAGNOSIS — I130 Hypertensive heart and chronic kidney disease with heart failure and stage 1 through stage 4 chronic kidney disease, or unspecified chronic kidney disease: Secondary | ICD-10-CM | POA: Diagnosis present

## 2016-11-16 DIAGNOSIS — F419 Anxiety disorder, unspecified: Secondary | ICD-10-CM | POA: Diagnosis present

## 2016-11-16 DIAGNOSIS — Z820 Family history of epilepsy and other diseases of the nervous system: Secondary | ICD-10-CM | POA: Diagnosis not present

## 2016-11-16 DIAGNOSIS — I110 Hypertensive heart disease with heart failure: Secondary | ICD-10-CM | POA: Diagnosis not present

## 2016-11-16 DIAGNOSIS — Z833 Family history of diabetes mellitus: Secondary | ICD-10-CM

## 2016-11-16 DIAGNOSIS — E669 Obesity, unspecified: Secondary | ICD-10-CM | POA: Diagnosis present

## 2016-11-16 DIAGNOSIS — M1711 Unilateral primary osteoarthritis, right knee: Principal | ICD-10-CM | POA: Diagnosis present

## 2016-11-16 DIAGNOSIS — Z7902 Long term (current) use of antithrombotics/antiplatelets: Secondary | ICD-10-CM

## 2016-11-16 DIAGNOSIS — I340 Nonrheumatic mitral (valve) insufficiency: Secondary | ICD-10-CM | POA: Diagnosis present

## 2016-11-16 DIAGNOSIS — Z882 Allergy status to sulfonamides status: Secondary | ICD-10-CM

## 2016-11-16 DIAGNOSIS — N183 Chronic kidney disease, stage 3 (moderate): Secondary | ICD-10-CM | POA: Diagnosis present

## 2016-11-16 DIAGNOSIS — E1151 Type 2 diabetes mellitus with diabetic peripheral angiopathy without gangrene: Secondary | ICD-10-CM | POA: Diagnosis present

## 2016-11-16 DIAGNOSIS — Z7984 Long term (current) use of oral hypoglycemic drugs: Secondary | ICD-10-CM

## 2016-11-16 DIAGNOSIS — Z79899 Other long term (current) drug therapy: Secondary | ICD-10-CM

## 2016-11-16 DIAGNOSIS — Z881 Allergy status to other antibiotic agents status: Secondary | ICD-10-CM

## 2016-11-16 DIAGNOSIS — R911 Solitary pulmonary nodule: Secondary | ICD-10-CM | POA: Diagnosis present

## 2016-11-16 DIAGNOSIS — E1121 Type 2 diabetes mellitus with diabetic nephropathy: Secondary | ICD-10-CM | POA: Diagnosis present

## 2016-11-16 DIAGNOSIS — Z955 Presence of coronary angioplasty implant and graft: Secondary | ICD-10-CM

## 2016-11-16 DIAGNOSIS — Z96651 Presence of right artificial knee joint: Secondary | ICD-10-CM

## 2016-11-16 DIAGNOSIS — Z6827 Body mass index (BMI) 27.0-27.9, adult: Secondary | ICD-10-CM

## 2016-11-16 DIAGNOSIS — F329 Major depressive disorder, single episode, unspecified: Secondary | ICD-10-CM | POA: Diagnosis present

## 2016-11-16 DIAGNOSIS — Z888 Allergy status to other drugs, medicaments and biological substances status: Secondary | ICD-10-CM

## 2016-11-16 DIAGNOSIS — Z86001 Personal history of in-situ neoplasm of cervix uteri: Secondary | ICD-10-CM

## 2016-11-16 DIAGNOSIS — Z90710 Acquired absence of both cervix and uterus: Secondary | ICD-10-CM

## 2016-11-16 DIAGNOSIS — I447 Left bundle-branch block, unspecified: Secondary | ICD-10-CM | POA: Diagnosis present

## 2016-11-16 DIAGNOSIS — Z79890 Hormone replacement therapy (postmenopausal): Secondary | ICD-10-CM

## 2016-11-16 DIAGNOSIS — E1122 Type 2 diabetes mellitus with diabetic chronic kidney disease: Secondary | ICD-10-CM | POA: Diagnosis present

## 2016-11-16 DIAGNOSIS — R32 Unspecified urinary incontinence: Secondary | ICD-10-CM | POA: Diagnosis not present

## 2016-11-16 DIAGNOSIS — K219 Gastro-esophageal reflux disease without esophagitis: Secondary | ICD-10-CM | POA: Diagnosis present

## 2016-11-16 DIAGNOSIS — I509 Heart failure, unspecified: Secondary | ICD-10-CM | POA: Diagnosis not present

## 2016-11-16 HISTORY — PX: TOTAL KNEE ARTHROPLASTY: SHX125

## 2016-11-16 HISTORY — DX: Presence of right artificial knee joint: Z96.651

## 2016-11-16 LAB — GLUCOSE, CAPILLARY
GLUCOSE-CAPILLARY: 157 mg/dL — AB (ref 65–99)
GLUCOSE-CAPILLARY: 158 mg/dL — AB (ref 65–99)
GLUCOSE-CAPILLARY: 176 mg/dL — AB (ref 65–99)

## 2016-11-16 LAB — TYPE AND SCREEN
ABO/RH(D): A POS
Antibody Screen: NEGATIVE

## 2016-11-16 SURGERY — ARTHROPLASTY, KNEE, TOTAL
Anesthesia: Spinal | Site: Knee | Laterality: Right

## 2016-11-16 MED ORDER — CARVEDILOL 25 MG PO TABS
25.0000 mg | ORAL_TABLET | Freq: Two times a day (BID) | ORAL | Status: DC
Start: 2016-11-16 — End: 2016-11-20
  Administered 2016-11-16 – 2016-11-20 (×8): 25 mg via ORAL
  Filled 2016-11-16 (×8): qty 1

## 2016-11-16 MED ORDER — SODIUM CHLORIDE 0.9 % IJ SOLN
INTRAMUSCULAR | Status: AC
  Filled 2016-11-16: qty 50

## 2016-11-16 MED ORDER — CEFAZOLIN IN D5W 1 GM/50ML IV SOLN
1.0000 g | Freq: Four times a day (QID) | INTRAVENOUS | Status: AC
  Administered 2016-11-16 (×2): 1 g via INTRAVENOUS
  Filled 2016-11-16 (×3): qty 50

## 2016-11-16 MED ORDER — ROPIVACAINE HCL 7.5 MG/ML IJ SOLN
INTRAMUSCULAR | Status: AC
  Filled 2016-11-16: qty 20

## 2016-11-16 MED ORDER — ACETAMINOPHEN 325 MG PO TABS
650.0000 mg | ORAL_TABLET | Freq: Four times a day (QID) | ORAL | Status: DC | PRN
  Administered 2016-11-17 – 2016-11-20 (×3): 650 mg via ORAL
  Filled 2016-11-16 (×3): qty 2

## 2016-11-16 MED ORDER — CHLORHEXIDINE GLUCONATE 4 % EX LIQD: 60.0000 mL | Freq: Once | CUTANEOUS | Status: DC

## 2016-11-16 MED ORDER — ONDANSETRON HCL 4 MG/2ML IJ SOLN: 4.0000 mg | Freq: Once | INTRAMUSCULAR | Status: DC | PRN

## 2016-11-16 MED ORDER — POLYETHYLENE GLYCOL 3350 17 G PO PACK: 17.0000 g | PACK | Freq: Every day | ORAL | Status: DC | PRN

## 2016-11-16 MED ORDER — FENTANYL CITRATE (PF) 100 MCG/2ML IJ SOLN: 25.0000 ug | INTRAMUSCULAR | Status: DC | PRN

## 2016-11-16 MED ORDER — BUPIVACAINE LIPOSOME 1.3 % IJ SUSP
INTRAMUSCULAR | Status: DC | PRN
  Administered 2016-11-16: 20 mL

## 2016-11-16 MED ORDER — TRANEXAMIC ACID 1000 MG/10ML IV SOLN
INTRAVENOUS | Status: DC | PRN
  Administered 2016-11-16: 2000 mg via TOPICAL

## 2016-11-16 MED ORDER — CLONAZEPAM 0.5 MG PO TABS
0.5000 mg | ORAL_TABLET | Freq: Every day | ORAL | Status: DC
  Administered 2016-11-16 – 2016-11-19 (×4): 0.5 mg via ORAL
  Filled 2016-11-16 (×4): qty 1

## 2016-11-16 MED ORDER — ONDANSETRON HCL 4 MG/2ML IJ SOLN
INTRAMUSCULAR | Status: AC
  Filled 2016-11-16: qty 2

## 2016-11-16 MED ORDER — FLEET ENEMA 7-19 GM/118ML RE ENEM: 1.0000 | ENEMA | Freq: Once | RECTAL | Status: DC | PRN

## 2016-11-16 MED ORDER — FERROUS SULFATE 325 (65 FE) MG PO TABS
325.0000 mg | ORAL_TABLET | Freq: Three times a day (TID) | ORAL | Status: DC
  Administered 2016-11-17 – 2016-11-20 (×8): 325 mg via ORAL
  Filled 2016-11-16 (×8): qty 1

## 2016-11-16 MED ORDER — BUPIVACAINE IN DEXTROSE 0.75-8.25 % IT SOLN
INTRATHECAL | Status: DC | PRN
  Administered 2016-11-16: 2 mL via INTRATHECAL

## 2016-11-16 MED ORDER — BISACODYL 5 MG PO TBEC: 5.0000 mg | DELAYED_RELEASE_TABLET | Freq: Every day | ORAL | Status: DC | PRN

## 2016-11-16 MED ORDER — ACETAMINOPHEN 650 MG RE SUPP: 650.0000 mg | Freq: Four times a day (QID) | RECTAL | Status: DC | PRN

## 2016-11-16 MED ORDER — EPHEDRINE 5 MG/ML INJ
INTRAVENOUS | Status: AC
  Filled 2016-11-16: qty 10

## 2016-11-16 MED ORDER — ROPIVACAINE HCL 7.5 MG/ML IJ SOLN
INTRAMUSCULAR | Status: DC | PRN
  Administered 2016-11-16: 20 mL via PERINEURAL

## 2016-11-16 MED ORDER — CHLORTHALIDONE 25 MG PO TABS
12.5000 mg | ORAL_TABLET | Freq: Every day | ORAL | Status: DC
  Administered 2016-11-17 – 2016-11-20 (×4): 12.5 mg via ORAL
  Filled 2016-11-16 (×4): qty 1

## 2016-11-16 MED ORDER — SODIUM CHLORIDE 0.9 % IV SOLN: INTRAVENOUS | Status: DC

## 2016-11-16 MED ORDER — ONDANSETRON HCL 4 MG PO TABS
4.0000 mg | ORAL_TABLET | Freq: Four times a day (QID) | ORAL | Status: DC | PRN
  Administered 2016-11-18: 08:00:00 4 mg via ORAL
  Filled 2016-11-16: qty 1

## 2016-11-16 MED ORDER — PROPOFOL 10 MG/ML IV BOLUS
INTRAVENOUS | Status: AC
  Filled 2016-11-16: qty 20

## 2016-11-16 MED ORDER — SODIUM CHLORIDE 0.9 % IR SOLN
Status: DC | PRN
  Administered 2016-11-16: 1000 mL

## 2016-11-16 MED ORDER — INSULIN ASPART 100 UNIT/ML ~~LOC~~ SOLN
0.0000 [IU] | Freq: Three times a day (TID) | SUBCUTANEOUS | Status: DC
  Administered 2016-11-16: 18:00:00 2 [IU] via SUBCUTANEOUS
  Administered 2016-11-17: 3 [IU] via SUBCUTANEOUS
  Administered 2016-11-18: 5 [IU] via SUBCUTANEOUS
  Administered 2016-11-18: 08:00:00 3 [IU] via SUBCUTANEOUS
  Administered 2016-11-18: 2 [IU] via SUBCUTANEOUS
  Administered 2016-11-19 – 2016-11-20 (×4): 3 [IU] via SUBCUTANEOUS
  Filled 2016-11-16: qty 1

## 2016-11-16 MED ORDER — METHOCARBAMOL 1000 MG/10ML IJ SOLN
500.0000 mg | Freq: Four times a day (QID) | INTRAVENOUS | Status: DC | PRN
  Administered 2016-11-16: 500 mg via INTRAVENOUS
  Filled 2016-11-16: qty 550
  Filled 2016-11-16: qty 5

## 2016-11-16 MED ORDER — BUPIVACAINE LIPOSOME 1.3 % IJ SUSP
INTRAMUSCULAR | Status: AC
  Filled 2016-11-16: qty 20

## 2016-11-16 MED ORDER — ONDANSETRON HCL 4 MG/2ML IJ SOLN
INTRAMUSCULAR | Status: DC | PRN
  Administered 2016-11-16: 4 mg via INTRAVENOUS

## 2016-11-16 MED ORDER — SODIUM CHLORIDE 0.9 % IJ SOLN
INTRAMUSCULAR | Status: DC | PRN
  Administered 2016-11-16: 20 mL

## 2016-11-16 MED ORDER — LACTATED RINGERS IV SOLN
INTRAVENOUS | Status: DC
  Administered 2016-11-16 – 2016-11-17 (×2): via INTRAVENOUS

## 2016-11-16 MED ORDER — LACTATED RINGERS IV SOLN
INTRAVENOUS | Status: DC
  Administered 2016-11-16: 11:00:00 via INTRAVENOUS

## 2016-11-16 MED ORDER — CEFAZOLIN SODIUM-DEXTROSE 2-4 GM/100ML-% IV SOLN
2.0000 g | INTRAVENOUS | Status: AC
  Administered 2016-11-16: 2 g via INTRAVENOUS
  Filled 2016-11-16: qty 100

## 2016-11-16 MED ORDER — ESTROPIPATE 0.75 MG PO TABS
1.5000 mg | ORAL_TABLET | Freq: Every day | ORAL | Status: DC
  Administered 2016-11-17 – 2016-11-20 (×4): 1.5 mg via ORAL
  Filled 2016-11-16: qty 1
  Filled 2016-11-16 (×4): qty 2

## 2016-11-16 MED ORDER — ALUM & MAG HYDROXIDE-SIMETH 200-200-20 MG/5ML PO SUSP: 30.0000 mL | ORAL | Status: DC | PRN

## 2016-11-16 MED ORDER — CITALOPRAM HYDROBROMIDE 20 MG PO TABS
20.0000 mg | ORAL_TABLET | Freq: Every day | ORAL | Status: DC
  Administered 2016-11-17 – 2016-11-20 (×4): 20 mg via ORAL
  Filled 2016-11-16 (×4): qty 1

## 2016-11-16 MED ORDER — METANX 3-35-2 MG PO TABS
1.0000 | ORAL_TABLET | Freq: Every day | ORAL | Status: DC
  Filled 2016-11-16: qty 1

## 2016-11-16 MED ORDER — FENTANYL CITRATE (PF) 100 MCG/2ML IJ SOLN
INTRAMUSCULAR | Status: AC
  Filled 2016-11-16: qty 2

## 2016-11-16 MED ORDER — NAPHAZOLINE-GLYCERIN 0.012-0.2 % OP SOLN
2.0000 [drp] | Freq: Four times a day (QID) | OPHTHALMIC | Status: DC | PRN
  Filled 2016-11-16: qty 15

## 2016-11-16 MED ORDER — LIDOCAINE 2% (20 MG/ML) 5 ML SYRINGE
INTRAMUSCULAR | Status: AC
  Filled 2016-11-16: qty 5

## 2016-11-16 MED ORDER — FOLTANX 3-35-2 MG PO TABS
1.0000 | ORAL_TABLET | Freq: Every day | ORAL | Status: DC
  Administered 2016-11-18 – 2016-11-20 (×3): 1 via ORAL
  Filled 2016-11-16 (×5): qty 1

## 2016-11-16 MED ORDER — VITAMIN B-12 100 MCG PO TABS
100.0000 ug | ORAL_TABLET | Freq: Every day | ORAL | Status: DC
  Administered 2016-11-18 – 2016-11-20 (×3): 100 ug via ORAL
  Filled 2016-11-16 (×5): qty 1

## 2016-11-16 MED ORDER — METHOCARBAMOL 500 MG PO TABS
500.0000 mg | ORAL_TABLET | Freq: Four times a day (QID) | ORAL | Status: DC | PRN
  Administered 2016-11-17 – 2016-11-19 (×5): 500 mg via ORAL
  Filled 2016-11-16 (×6): qty 1

## 2016-11-16 MED ORDER — CEFAZOLIN SODIUM-DEXTROSE 2-4 GM/100ML-% IV SOLN
INTRAVENOUS | Status: AC
  Filled 2016-11-16: qty 100

## 2016-11-16 MED ORDER — EPHEDRINE SULFATE 50 MG/ML IJ SOLN
INTRAMUSCULAR | Status: DC | PRN
Start: 2016-11-16 — End: 2016-11-16
  Administered 2016-11-16: 15 mg via INTRAVENOUS
  Administered 2016-11-16: 5 mg via INTRAVENOUS

## 2016-11-16 MED ORDER — HYDROMORPHONE HCL 1 MG/ML IJ SOLN
0.5000 mg | INTRAMUSCULAR | Status: DC | PRN
  Administered 2016-11-16 – 2016-11-17 (×4): 0.5 mg via INTRAVENOUS
  Filled 2016-11-16 (×4): qty 0.5

## 2016-11-16 MED ORDER — PROPOFOL 10 MG/ML IV BOLUS
INTRAVENOUS | Status: DC | PRN
  Administered 2016-11-16: 20 mg via INTRAVENOUS

## 2016-11-16 MED ORDER — PHENOL 1.4 % MT LIQD
1.0000 | OROMUCOSAL | Status: DC | PRN
  Filled 2016-11-16: qty 177

## 2016-11-16 MED ORDER — CLONAZEPAM 0.5 MG PO TABS
0.2500 mg | ORAL_TABLET | Freq: Every day | ORAL | Status: DC
  Administered 2016-11-17 – 2016-11-20 (×4): 0.25 mg via ORAL
  Filled 2016-11-16 (×4): qty 1

## 2016-11-16 MED ORDER — HYDROMORPHONE HCL 2 MG PO TABS
2.0000 mg | ORAL_TABLET | ORAL | Status: DC | PRN
  Administered 2016-11-16 – 2016-11-19 (×14): 2 mg via ORAL
  Filled 2016-11-16 (×14): qty 1

## 2016-11-16 MED ORDER — RIVAROXABAN 10 MG PO TABS
10.0000 mg | ORAL_TABLET | Freq: Every day | ORAL | Status: DC
  Administered 2016-11-17 – 2016-11-20 (×4): 10 mg via ORAL
  Filled 2016-11-16 (×4): qty 1

## 2016-11-16 MED ORDER — PROPOFOL 10 MG/ML IV BOLUS
INTRAVENOUS | Status: AC
  Filled 2016-11-16: qty 40

## 2016-11-16 MED ORDER — LOSARTAN POTASSIUM 50 MG PO TABS
50.0000 mg | ORAL_TABLET | Freq: Every day | ORAL | Status: DC
  Administered 2016-11-16 – 2016-11-19 (×4): 50 mg via ORAL
  Filled 2016-11-16 (×4): qty 1

## 2016-11-16 MED ORDER — AMLODIPINE BESYLATE 5 MG PO TABS
5.0000 mg | ORAL_TABLET | Freq: Two times a day (BID) | ORAL | Status: DC
  Administered 2016-11-16 – 2016-11-20 (×8): 5 mg via ORAL
  Filled 2016-11-16 (×8): qty 1

## 2016-11-16 MED ORDER — PROPOFOL 500 MG/50ML IV EMUL
INTRAVENOUS | Status: DC | PRN
  Administered 2016-11-16: 25 ug/kg/min via INTRAVENOUS

## 2016-11-16 MED ORDER — FENTANYL CITRATE (PF) 100 MCG/2ML IJ SOLN
50.0000 ug | Freq: Once | INTRAMUSCULAR | Status: AC
  Administered 2016-11-16: 50 ug via INTRAVENOUS

## 2016-11-16 MED ORDER — ONDANSETRON HCL 4 MG/2ML IJ SOLN
4.0000 mg | Freq: Four times a day (QID) | INTRAMUSCULAR | Status: DC | PRN
  Administered 2016-11-16 – 2016-11-17 (×4): 4 mg via INTRAVENOUS
  Filled 2016-11-16 (×4): qty 2

## 2016-11-16 MED ORDER — MENTHOL 3 MG MT LOZG
1.0000 | LOZENGE | OROMUCOSAL | Status: DC | PRN
Start: 2016-11-16 — End: 2016-11-20

## 2016-11-16 MED ORDER — SODIUM CHLORIDE 0.9 % IR SOLN
Status: DC | PRN
  Administered 2016-11-16: 500 mL

## 2016-11-16 MED ORDER — TRAZODONE HCL 50 MG PO TABS
50.0000 mg | ORAL_TABLET | Freq: Every day | ORAL | Status: DC
  Administered 2016-11-16 – 2016-11-19 (×4): 50 mg via ORAL
  Filled 2016-11-16 (×4): qty 1

## 2016-11-16 SURGICAL SUPPLY — 65 items
BAG DECANTER FOR FLEXI CONT (MISCELLANEOUS) ×3 IMPLANT
BAG ZIPLOCK 12X15 (MISCELLANEOUS) IMPLANT
BANDAGE ACE 4X5 VEL STRL LF (GAUZE/BANDAGES/DRESSINGS) ×3 IMPLANT
BANDAGE ACE 6X5 VEL STRL LF (GAUZE/BANDAGES/DRESSINGS) ×3 IMPLANT
BLADE SAG 18X100X1.27 (BLADE) ×3 IMPLANT
BLADE SAW SGTL 11.0X1.19X90.0M (BLADE) ×3 IMPLANT
BNDG COHESIVE 4X5 TAN NS LF (GAUZE/BANDAGES/DRESSINGS) ×9 IMPLANT
BNDG GAUZE ELAST 4 BULKY (GAUZE/BANDAGES/DRESSINGS) ×3 IMPLANT
BONE CEMENT GENTAMICIN (Cement) ×3 IMPLANT
CAP KNEE TOTAL 3 SIGMA ×3 IMPLANT
CEMENT BONE GENTAMICIN 40 (Cement) ×1 IMPLANT
CLOSURE WOUND 1/2 X4 (GAUZE/BANDAGES/DRESSINGS) ×2
CLOTH BEACON ORANGE TIMEOUT ST (SAFETY) ×3 IMPLANT
CUFF TOURN SGL QUICK 34 (TOURNIQUET CUFF) ×2
CUFF TRNQT CYL 34X4X40X1 (TOURNIQUET CUFF) ×1 IMPLANT
DECANTER SPIKE VIAL GLASS SM (MISCELLANEOUS) ×3 IMPLANT
DRAPE INCISE IOBAN 66X45 STRL (DRAPES) IMPLANT
DRAPE U-SHAPE 47X51 STRL (DRAPES) ×3 IMPLANT
DRSG ADAPTIC 3X8 NADH LF (GAUZE/BANDAGES/DRESSINGS) ×3 IMPLANT
DRSG PAD ABDOMINAL 8X10 ST (GAUZE/BANDAGES/DRESSINGS) ×3 IMPLANT
DURAPREP 26ML APPLICATOR (WOUND CARE) ×3 IMPLANT
ELECT REM PT RETURN 9FT ADLT (ELECTROSURGICAL) ×3
ELECTRODE REM PT RTRN 9FT ADLT (ELECTROSURGICAL) ×1 IMPLANT
EVACUATOR 1/8 PVC DRAIN (DRAIN) ×3 IMPLANT
FACESHIELD WRAPAROUND (MASK) ×12 IMPLANT
GAUZE SPONGE 4X4 12PLY STRL (GAUZE/BANDAGES/DRESSINGS) ×3 IMPLANT
GLOVE BIOGEL PI IND STRL 6.5 (GLOVE) ×1 IMPLANT
GLOVE BIOGEL PI IND STRL 8.5 (GLOVE) ×1 IMPLANT
GLOVE BIOGEL PI INDICATOR 6.5 (GLOVE) ×2
GLOVE BIOGEL PI INDICATOR 8.5 (GLOVE) ×2
GLOVE ECLIPSE 8.0 STRL XLNG CF (GLOVE) ×6 IMPLANT
GLOVE SURG SS PI 6.5 STRL IVOR (GLOVE) ×3 IMPLANT
GOWN STRL REUS W/TWL LRG LVL3 (GOWN DISPOSABLE) ×3 IMPLANT
GOWN STRL REUS W/TWL XL LVL3 (GOWN DISPOSABLE) ×3 IMPLANT
HANDPIECE INTERPULSE COAX TIP (DISPOSABLE) ×2
HEMOSTAT SPONGE AVITENE ULTRA (HEMOSTASIS) ×3 IMPLANT
IMMOBILIZER KNEE 20 (SOFTGOODS) ×3
IMMOBILIZER KNEE 20 THIGH 36 (SOFTGOODS) ×1 IMPLANT
MANIFOLD NEPTUNE II (INSTRUMENTS) ×3 IMPLANT
NEEDLE HYPO 21X1.5 SAFETY (NEEDLE) ×3 IMPLANT
NEEDLE HYPO 22GX1.5 SAFETY (NEEDLE) ×3 IMPLANT
NS IRRIG 1000ML POUR BTL (IV SOLUTION) IMPLANT
PACK TOTAL KNEE CUSTOM (KITS) ×3 IMPLANT
PADDING CAST COTTON 6X4 STRL (CAST SUPPLIES) ×3 IMPLANT
PENCIL SMOKE EVAC W/HOLSTER (ELECTROSURGICAL) ×3 IMPLANT
POSITIONER SURGICAL ARM (MISCELLANEOUS) ×3 IMPLANT
SET HNDPC FAN SPRY TIP SCT (DISPOSABLE) ×1 IMPLANT
SET PAD KNEE POSITIONER (MISCELLANEOUS) ×3 IMPLANT
SPONGE LAP 18X18 X RAY DECT (DISPOSABLE) ×3 IMPLANT
STRIP CLOSURE SKIN 1/2X4 (GAUZE/BANDAGES/DRESSINGS) ×4 IMPLANT
SUT BONE WAX W31G (SUTURE) IMPLANT
SUT MNCRL AB 4-0 PS2 18 (SUTURE) ×3 IMPLANT
SUT VIC AB 1 CT1 27 (SUTURE) ×4
SUT VIC AB 1 CT1 27XBRD ANTBC (SUTURE) ×2 IMPLANT
SUT VIC AB 2-0 CT1 27 (SUTURE) ×6
SUT VIC AB 2-0 CT1 TAPERPNT 27 (SUTURE) ×3 IMPLANT
SUT VLOC 180 0 24IN GS25 (SUTURE) ×3 IMPLANT
SYR 20CC LL (SYRINGE) ×6 IMPLANT
SYRINGE 60CC LL (MISCELLANEOUS) ×3 IMPLANT
TOWER CARTRIDGE SMART MIX (DISPOSABLE) ×3 IMPLANT
TRAY FOLEY CATH 14FRSI W/METER (CATHETERS) ×3 IMPLANT
TRAY FOLEY W/METER SILVER 16FR (SET/KITS/TRAYS/PACK) IMPLANT
WATER STERILE IRR 1500ML POUR (IV SOLUTION) ×3 IMPLANT
WRAP KNEE MAXI GEL POST OP (GAUZE/BANDAGES/DRESSINGS) ×3 IMPLANT
YANKAUER SUCT BULB TIP 10FT TU (MISCELLANEOUS) ×3 IMPLANT

## 2016-11-16 NOTE — Interval H&P Note (Signed)
History and Physical Interval Note:  11/16/2016 10:41 AM  Doris Carr  has presented today for surgery, with the diagnosis of Osteoarthritis Right Knee  The various methods of treatment have been discussed with the patient and family. After consideration of risks, benefits and other options for treatment, the patient has consented to  Procedure(s): RIGHT TOTAL KNEE ARTHROPLASTY (Right) as a surgical intervention .  The patient's history has been reviewed, patient examined, no change in status, stable for surgery.  I have reviewed the patient's chart and labs.  Questions were answered to the patient's satisfaction.     Omauri Boeve A

## 2016-11-16 NOTE — Anesthesia Procedure Notes (Signed)
Procedure Name: MAC Date/Time: 11/16/2016 11:17 AM Performed by: Wanita Chamberlain Pre-anesthesia Checklist: Patient identified, Emergency Drugs available, Suction available, Patient being monitored and Timeout performed Patient Re-evaluated:Patient Re-evaluated prior to inductionOxygen Delivery Method: Simple face mask Placement Confirmation: breath sounds checked- equal and bilateral

## 2016-11-16 NOTE — Interval H&P Note (Signed)
History and Physical Interval Note:  11/16/2016 10:44 AM  Doris Carr  has presented today for surgery, with the diagnosis of Osteoarthritis Right Knee  The various methods of treatment have been discussed with the patient and family. After consideration of risks, benefits and other options for treatment, the patient has consented to  Procedure(s): RIGHT TOTAL KNEE ARTHROPLASTY (Right) as a surgical intervention .  The patient's history has been reviewed, patient examined, no change in status, stable for surgery.  I have reviewed the patient's chart and labs.  Questions were answered to the patient's satisfaction.     [name] A

## 2016-11-16 NOTE — H&P (Signed)
CSW consulted for SNF placement. H & P indicates this pt will return home at d/c and have outpatient therapy. CSW will be available to assist with d/c planning if plan changes and SNF is required.   Werner Lean LCSW [phone]

## 2016-11-16 NOTE — Anesthesia Postprocedure Evaluation (Signed)
Anesthesia Post Note  Patient: Doris Carr  Procedure(s) Performed: Procedure(s) (LRB): RIGHT TOTAL KNEE ARTHROPLASTY (Right)  Patient location during evaluation: PACU Anesthesia Type: Spinal Level of consciousness: oriented and awake and alert Pain management: pain level controlled Vital Signs Assessment: post-procedure vital signs reviewed and stable Respiratory status: spontaneous breathing, respiratory function stable and patient connected to nasal cannula oxygen Cardiovascular status: blood pressure returned to baseline and stable Postop Assessment: no headache, no backache, spinal receding, patient able to bend at knees and no signs of nausea or vomiting Anesthetic complications: no       Last Vitals:  Vitals:   11/16/16 1637 11/16/16 1751  BP: (!) 147/59 (!) 156/62  Pulse: 95 73  Resp: 14 14  Temp: 36.4 C 36.4 C    Last Pain:  Vitals:   11/16/16 1751  TempSrc: Axillary  PainSc:                  [name]

## 2016-11-16 NOTE — Anesthesia Procedure Notes (Signed)
Spinal  Patient location during procedure: OR Start time: 11/16/2016 11:15 AM End time: 11/16/2016 11:17 AM Staffing Anesthesiologist: [name] Performed: anesthesiologist  Preanesthetic Checklist Completed: patient identified, surgical consent, pre-op evaluation, timeout performed, IV checked, risks and benefits discussed and monitors and equipment checked Spinal Block Patient position: sitting Prep: site prepped and draped and DuraPrep Patient monitoring: continuous pulse ox and blood pressure Approach: midline Location: L3-4 Injection technique: single-shot Needle Needle type: Pencan  Needle gauge: 24 G Needle length: 9 cm Assessment Sensory level: T8 Additional Notes Functioning IV was confirmed and monitors were applied. Sterile prep and drape, including hand hygiene, mask and sterile gloves were used. The patient was positioned and the spine was prepped. The skin was anesthetized with lidocaine.  Free flow of clear CSF was obtained prior to injecting local anesthetic into the CSF.  The spinal needle aspirated freely following injection.  The needle was carefully withdrawn.  The patient tolerated the procedure well. Consent was obtained prior to procedure with all questions answered and concerns addressed. Risks including but not limited to bleeding, infection, nerve damage, paralysis, failed block, inadequate analgesia, allergic reaction, high spinal, itching and headache were discussed and the patient wished to proceed.   Hoy Morn, MD

## 2016-11-16 NOTE — Progress Notes (Signed)
CSW consulted for SNF placement. PN reviewed. H & P indicates that pt will return home at d/c and have outpatient therapy. CSW will remain available to assist with d/c planning in case plan changes and SNF is needed.  Werner Lean LCSW [phone]

## 2016-11-16 NOTE — Progress Notes (Signed)
Assisted Dr. Turk with right, ultrasound guided, adductor canal block. Side rails up, monitors on throughout procedure. See vital signs in flow sheet. Tolerated Procedure well.  

## 2016-11-16 NOTE — Anesthesia Preprocedure Evaluation (Signed)
Anesthesia Evaluation  Patient identified by MRN, date of birth, ID band Patient awake    Reviewed: Allergy & Precautions, NPO status , Patient's Chart, lab work & pertinent test results, reviewed documented beta blocker date and time   History of Anesthesia Complications (+) PONV and history of anesthetic complications  Airway Mallampati: II  TM Distance: >3 FB Neck ROM: Full    Dental  (+) Teeth Intact, Dental Advisory Given   Pulmonary former smoker,    Pulmonary exam normal breath sounds clear to auscultation       Cardiovascular hypertension, Pt. on medications and Pt. on home beta blockers + Peripheral Vascular Disease and +CHF  Normal cardiovascular exam+ dysrhythmias (LBBB) + Valvular Problems/Murmurs MR  Rhythm:Regular Rate:Normal  Echo 8/12: Study Conclusions  - Left ventricle: The cavity size was normal. Wall thickness wasincreased in a pattern of moderate LVH. Systolic function wasnormal. The estimated ejection fraction was in the range of 60% to65%. Doppler parameters are consistent with abnormal leftventricular relaxation (grade 1 diastolic dysfunction). - Mitral valve: Mild regurgitation.   Neuro/Psych PSYCHIATRIC DISORDERS Anxiety Depression negative neurological ROS     GI/Hepatic Neg liver ROS, GERD  Medicated,  Endo/Other  diabetes, Type 2, Oral Hypoglycemic Agents  Renal/GU Renal InsufficiencyRenal disease     Musculoskeletal  (+) Arthritis , Osteoarthritis,    Abdominal   Peds  Hematology  (+) Blood dyscrasia (Plavix therapy), , Plt 165k   Anesthesia Other Findings Day of surgery medications reviewed with the patient.  Reproductive/Obstetrics                             Anesthesia Physical Anesthesia Plan  ASA: III  Anesthesia Plan: Spinal and MAC   Post-op Pain Management:  Regional for Post-op pain   Induction: Intravenous  Airway Management Planned:  Simple Face Mask  Additional Equipment:   Intra-op Plan:   Post-operative Plan:   Informed Consent: I have reviewed the patients History and Physical, chart, labs and discussed the procedure including the risks, benefits and alternatives for the proposed anesthesia with the patient or authorized representative who has indicated his/her understanding and acceptance.   Dental advisory given  Plan Discussed with: CRNA, Anesthesiologist and Surgeon  Anesthesia Plan Comments: (Discussed risks and benefits of and differences between spinal and general. Discussed risks of spinal including headache, backache, failure, bleeding, infection, and nerve damage. Patient consents to spinal. Questions answered. Coagulation studies and platelet count acceptable.)        Anesthesia Quick Evaluation

## 2016-11-16 NOTE — Transfer of Care (Signed)
Immediate Anesthesia Transfer of Care Note  Patient: Doris Carr  Procedure(s) Performed: Procedure(s) (LRB): RIGHT TOTAL KNEE ARTHROPLASTY (Right)  Patient Location: PACU  Anesthesia Type: MAC / SAB  Level of Consciousness: awake, sedated, patient cooperative and responds to stimulation  Airway & Oxygen Therapy: Patient Spontanous Breathing and Patient on room air   Post-op Assessment: Report given to PACU RN, Post -op Vital signs reviewed and stable and Patient moving all extremities / T 12 level   Post vital signs: Reviewed and stable  Complications: No apparent anesthesia complications

## 2016-11-16 NOTE — Brief Op Note (Signed)
11/16/2016  1:08 PM  PATIENT:  Doris Carr  81 y.o. female  PRE-OPERATIVE DIAGNOSIS: Primary Osteoarthritis Right Knee  POST-OPERATIVE DIAGNOSIS:Primary  Osteoarthritis Right Knee  PROCEDURE:  Procedure(s) with comments: RIGHT TOTAL KNEE ARTHROPLASTY (Right) - block  SURGEON:  Surgeon(s) and Role:    * Latanya Maudlin, MD - Primary  PHYSICIAN ASSISTANT:[employer] PA  ASSISTANTS: Ardeen Jourdain PA  ANESTHESIA:   spinal and Adductor Block  EBL:  Total I/O In: 400 [I.V.:400] Out: 120 [Urine:100; Blood:20]  BLOOD ADMINISTERED:none  DRAINS: (one) Hemovact drain(s) in the Right Knee with  Suction Open   LOCAL MEDICATIONS USED:  OTHER Exparel20cc mixed with 20cc of Normal Saline  SPECIMEN:  No Specimen  DISPOSITION OF SPECIMEN:  N/A  COUNTS:  YES  TOURNIQUET:   Total Tourniquet Time Documented: Thigh (Right) - 70 minutes Total: Thigh (Right) - 70 minutes   DICTATION: .Other Dictation: Dictation Number [id number]  PLAN OF CARE: Admit to inpatient   PATIENT DISPOSITION:  Stable in OR   Delay start of Pharmacological VTE agent (>24hrs) due to surgical blood loss or risk of bleeding: yes

## 2016-11-16 NOTE — Anesthesia Procedure Notes (Signed)
Anesthesia Regional Block:  Adductor canal block  Pre-Anesthetic Checklist: ,, timeout performed, Correct Patient, Correct Site, Correct Laterality, Correct Procedure, Correct Position, site marked, Risks and benefits discussed,  Surgical consent,  Pre-op evaluation,  At surgeon's request and post-op pain management  Laterality: Right  Prep: chloraprep       Needles:  Injection technique: Single-shot  Needle Type: Echogenic Needle     Needle Length: 9cm 9 cm Needle Gauge: 21 and 21 G    Additional Needles:  Procedures: ultrasound guided (picture in chart) Adductor canal block Narrative:  Start time: 11/16/2016 10:45 AM End time: 11/16/2016 10:50 AM Injection made incrementally with aspirations every 5 mL.  Performed by: Personally  Anesthesiologist: [name]  Additional Notes: No pain on injection. No increased resistance to injection. Injection made in 5cc increments.  Good needle visualization.  Patient tolerated procedure well.

## 2016-11-16 NOTE — Op Note (Signed)
NAMEKALIMA, SCHRADE NO.:  [id number]  MEDICAL RECORD NO.:  [id number]  LOCATION:  [facility]                         FACILITY:  [facility]  PHYSICIAN:  Kipp Brood. Anderia Lorenzo, M.D.DATE OF BIRTH:  [birth year]  DATE OF PROCEDURE:  11/16/2016 DATE OF DISCHARGE:                              OPERATIVE REPORT   SURGEON:  Kipp Brood. Gladstone Lighter, M.D.  OPERATIVE ASSISTANT:  Ardeen Jourdain, PA.  PREOPERATIVE DIAGNOSIS:  Bone-on-bone primary osteoarthritis, right knee.  POSTOPERATIVE DIAGNOSIS:  Bone-on-bone primary osteoarthritis, right knee.  OPERATION:  Right total knee arthroplasty utilizing DePuy system.  The sizes used was a size 2.5 femoral component, right posterior cruciate sacrificing type.  Tibial tray size 2.  The insert was a size 2, 12.5 mm thickness rotating platform.  Patella size 35.  All 3 components were cemented and gentamicin was used in the cement.  DESCRIPTION OF PROCEDURE:  At this time, the appropriate time-out was carried out.  I also marked the appropriate right leg in the holding area.  She also had an adductor block on the right.  She also had spinal anesthesia.  After routine prep and draping was carried out, the leg then was exsanguinated with Esmarch.  Tourniquet was elevated to 350 mmHg.  I initially elevated tourniquet to 325, but we had some bleeding as she has been on Plavix for a long time before.  We had her off the Plavix for 5 days, but I deflated the tourniquet after about 10 minutes and then readjusted it to 350 mmHg.  At this time, the leg was placed in a [facility] knee holder and an anterior approach to the knee was carried out with the knee flexed.  Two flaps were created.  I then carried out a median parapatellar incision.  I reflected the patella laterally and then did medial and lateral meniscectomies and excised the anterior- posterior cruciate ligaments.  At this time, initial drill hole was made in the intercondylar notch.  The guide  rod was inserted up the canal to make sure we were in the canal, we were.  We then removed that, thoroughly irrigated out the canal several times.  We then removed 11 mm thickness off the distal femur utilizing Nex jig.  Following that, we measured the femur to be a size 2.5.  We then did our anterior, posterior, and chamfer cuts in the usual fashion.  At this time, we then directed attention to the tibia plateau.  Initial drill hole was made to tibial plateau.  The guide rod was inserted down the canal finder.  It was inserted down the canal.  We removed that, thoroughly irrigated out the canal.  We then removed 6 mm thickness off the affected medial side of the knee.  We then did insert our lamina spreaders, removed posterior spurs of the femur and then our spacer block was inserted for a 12.5 insert.  At this particular time, we then went on and cut our keel, cut out of the tibial plateau and our distal notch cut out of the distal femur.  We inserted our trial components, had excellent function there and at this point we then did a resurfacing  procedure on the patella for a size 35 patella.  Three drill holes were made in the patella.  At this time, all trial components were removed.  We thoroughly water picked the knee out, cemented all 3 components in simultaneously.  Once the cement was hard, we removed all loose pieces of cement.  I then inserted my permanent rotating platform, 12.5 mm thickness, size 2 dimension, reduced the knee and had excellent function.  We then inserted a Hemovac drain, closed the wound layers in usual fashion after we injected a mixture of 20 mL of Exparel with 20 mL of saline.  Sterile dressings were applied.  She had 2 g of IV Ancef preop.          ______________________________ Kipp Brood. Gladstone Lighter, M.D.     RAG/MEDQ  D:  11/16/2016  T:  11/16/2016  Job:  [id number]

## 2016-11-17 LAB — CBC
HEMATOCRIT: 33.7 % — AB (ref 36.0–46.0)
Hemoglobin: 12.1 g/dL (ref 12.0–15.0)
MCH: 32.3 pg (ref 26.0–34.0)
MCHC: 35.9 g/dL (ref 30.0–36.0)
MCV: 89.9 fL (ref 78.0–100.0)
Platelets: 157 10*3/uL (ref 150–400)
RBC: 3.75 MIL/uL — ABNORMAL LOW (ref 3.87–5.11)
RDW: 12 % (ref 11.5–15.5)
WBC: 11.4 10*3/uL — AB (ref 4.0–10.5)

## 2016-11-17 LAB — BASIC METABOLIC PANEL
ANION GAP: 11 (ref 5–15)
BUN: 21 mg/dL — ABNORMAL HIGH (ref 6–20)
CALCIUM: 9.3 mg/dL (ref 8.9–10.3)
CO2: 30 mmol/L (ref 22–32)
Chloride: 95 mmol/L — ABNORMAL LOW (ref 101–111)
Creatinine, Ser: 0.85 mg/dL (ref 0.44–1.00)
Glucose, Bld: 232 mg/dL — ABNORMAL HIGH (ref 65–99)
Potassium: 3.3 mmol/L — ABNORMAL LOW (ref 3.5–5.1)
Sodium: 136 mmol/L (ref 135–145)

## 2016-11-17 LAB — GLUCOSE, CAPILLARY
GLUCOSE-CAPILLARY: 195 mg/dL — AB (ref 65–99)
Glucose-Capillary: 209 mg/dL — ABNORMAL HIGH (ref 65–99)
Glucose-Capillary: 213 mg/dL — ABNORMAL HIGH (ref 65–99)
Glucose-Capillary: 238 mg/dL — ABNORMAL HIGH (ref 65–99)

## 2016-11-17 MED ORDER — SODIUM CHLORIDE 0.9 % IV SOLN
INTRAVENOUS | Status: DC
  Administered 2016-11-17 – 2016-11-20 (×2): via INTRAVENOUS

## 2016-11-17 MED ORDER — METOCLOPRAMIDE HCL 5 MG/ML IJ SOLN
5.0000 mg | Freq: Four times a day (QID) | INTRAMUSCULAR | Status: DC
  Administered 2016-11-17 – 2016-11-19 (×5): 10 mg via INTRAVENOUS
  Administered 2016-11-20: 06:00:00 5 mg via INTRAVENOUS
  Filled 2016-11-17 (×8): qty 2

## 2016-11-17 MED ORDER — METHOCARBAMOL 500 MG PO TABS: 500.0000 mg | ORAL_TABLET | Freq: Four times a day (QID) | ORAL | 1 refills | Status: DC | PRN

## 2016-11-17 MED ORDER — HYDROMORPHONE HCL 2 MG PO TABS: 2.0000 mg | ORAL_TABLET | ORAL | 0 refills | Status: DC | PRN

## 2016-11-17 MED ORDER — METOCLOPRAMIDE HCL 10 MG PO TABS
10.0000 mg | ORAL_TABLET | Freq: Three times a day (TID) | ORAL | Status: DC | PRN
  Administered 2016-11-18: 13:00:00 10 mg via ORAL
  Filled 2016-11-17: qty 1

## 2016-11-17 NOTE — Discharge Instructions (Signed)

## 2016-11-17 NOTE — Care Management Note (Signed)
Case Management Note  Patient Details  Name: Doris Carr MRN: [id number] Date of Birth: 24-Oct-1935  Subjective/Objective:                  RIGHT TOTAL KNEE ARTHROPLASTY Action/Plan: Discharge planning Expected Discharge Date:  11/18/16               Expected Discharge Plan:  Home/Self Care  In-House Referral:     Discharge planning Services  CM Consult  Post Acute Care Choice:  NA Choice offered to:  Adult Children, Patient  DME Arranged:  N/A DME Agency:  NA  HH Arranged:  NA HH Agency:  NA  Status of Service:  Completed, signed off  If discussed at [place] of Stay Meetings, dates discussed:    Additional Comments: CM spoke with daughter of pt to confirm plan is for outpt PT; daughter confirms.  Pt has all DME needed at home. NO other CM needs were communicated. Dellie Catholic, RN 11/17/2016, 11:21 AM

## 2016-11-17 NOTE — Progress Notes (Signed)
OT Cancellation Note  Patient Details Name: Doris Carr MRN: [id number] DOB: [birth year]   Cancelled Treatment:    Reason Eval/Treat Not Completed: Other (comment)  Pt limited by nausea/vomiting. Will check back tomorrow  Bobbette Eakes 11/17/2016, 12:07 PM  Lesle Chris, OTR/L [phone] 11/17/2016

## 2016-11-17 NOTE — Progress Notes (Signed)
PT Cancellation Note  Patient Details Name: Doris Carr MRN: [id number] DOB: September 25, 1936   Cancelled Treatment:     PT order received and eval attempted x 2 but deferred 2* N&V.  Will follow.   Doris Carr 11/17/2016, 11:27 AM

## 2016-11-17 NOTE — Progress Notes (Signed)
PT Cancellation Note  Patient Details Name: MAKAILYN FEDELE MRN: [id number] DOB: 09-29-1936   Cancelled Treatment:    Reason Eval/Treat Not Completed: Patient declined, no reason specified (in AM had N/V, at this time, declines stating" I can't get up". declined exercises as well.)   [name] 11/17/2016, 2:59 PM

## 2016-11-17 NOTE — Progress Notes (Signed)
Subjective: 1 Day Post-Op Procedure(s) (LRB): RIGHT TOTAL KNEE ARTHROPLASTY (Right) Patient reports pain as 3 on 0-10 scale.Doing well. Hemovac DCd.     Objective: Vital signs in last 24 hours: Temp:  [97.5 F (36.4 C)-97.7 F (36.5 C)] 97.7 F (36.5 C) (02/15 0147) Pulse Rate:  [62-97] 79 (02/15 0147) Resp:  [9-18] 15 (02/15 0147) BP: (91-156)/(54-70) 154/64 (02/15 0147) SpO2:  [92 %-100 %] 99 % (02/15 0147) Weight:  [69.6 kg (153 lb 6 oz)] 69.6 kg (153 lb 6 oz) (02/14 0900)  Intake/Output from previous day: 02/14 0701 - 02/15 0700 In: 3075 [P.O.:720; I.V.:2200; IV Piggyback:155] Out: 2290 [Urine:1120; Emesis/NG output:750; Drains:400; Blood:20] Intake/Output this shift: No intake/output data recorded.   Recent Labs  11/17/16 0429  HGB 12.1    Recent Labs  11/17/16 0429  WBC 11.4*  RBC 3.75*  HCT 33.7*  PLT 157    Recent Labs  11/17/16 0429  NA 136  K 3.3*  CL 95*  CO2 30  BUN 21*  CREATININE 0.85  GLUCOSE 232*  CALCIUM 9.3   No results for input(s): LABPT, INR in the last 72 hours.  Dorsiflexion/Plantar flexion intact No cellulitis present Compartment soft  Assessment/Plan: 1 Day Post-Op Procedure(s) (LRB): RIGHT TOTAL KNEE ARTHROPLASTY (Right) Up with therapy  Haillee Johann A 11/17/2016, 7:21 AM

## 2016-11-18 LAB — GLUCOSE, CAPILLARY
GLUCOSE-CAPILLARY: 161 mg/dL — AB (ref 65–99)
GLUCOSE-CAPILLARY: 204 mg/dL — AB (ref 65–99)
GLUCOSE-CAPILLARY: 268 mg/dL — AB (ref 65–99)
Glucose-Capillary: 194 mg/dL — ABNORMAL HIGH (ref 65–99)
Glucose-Capillary: 266 mg/dL — ABNORMAL HIGH (ref 65–99)

## 2016-11-18 LAB — BASIC METABOLIC PANEL
Anion gap: 12 (ref 5–15)
BUN: 19 mg/dL (ref 6–20)
CO2: 29 mmol/L (ref 22–32)
Calcium: 8.5 mg/dL — ABNORMAL LOW (ref 8.9–10.3)
Chloride: 89 mmol/L — ABNORMAL LOW (ref 101–111)
Creatinine, Ser: 0.83 mg/dL (ref 0.44–1.00)
GFR calc Af Amer: >60 mL/min (ref >60)
Glucose, Bld: 212 mg/dL — ABNORMAL HIGH (ref 65–99)
POTASSIUM: 2.7 mmol/L — AB (ref 3.5–5.1)
SODIUM: 130 mmol/L — AB (ref 135–145)

## 2016-11-18 LAB — CBC
HCT: 31.9 % — ABNORMAL LOW (ref 36.0–46.0)
Hemoglobin: 11.6 g/dL — ABNORMAL LOW (ref 12.0–15.0)
MCH: 32.3 pg (ref 26.0–34.0)
MCHC: 36.4 g/dL — ABNORMAL HIGH (ref 30.0–36.0)
MCV: 88.9 fL (ref 78.0–100.0)
PLATELETS: 158 10*3/uL (ref 150–400)
RBC: 3.59 MIL/uL — AB (ref 3.87–5.11)
RDW: 12 % (ref 11.5–15.5)
WBC: 15.8 10*3/uL — AB (ref 4.0–10.5)

## 2016-11-18 MED ORDER — METOCLOPRAMIDE HCL 10 MG PO TABS: 10.0000 mg | ORAL_TABLET | Freq: Three times a day (TID) | ORAL | 1 refills | Status: DC | PRN

## 2016-11-18 NOTE — Progress Notes (Signed)
CRITICAL VALUE ALERT  Critical value received:  Potassium 2.7  Date of notification:  11/18/16  Time of notification:  5:35 AM  Critical value read back:Yes.    Nurse who received alert:  Tobie Poet  MD notified (1st page):  [place] on-call  Time of first page:  5:40 AM  MD notified (2nd page):  Time of second page:  Responding MD:  Dierdre Highman PA-C  Time MD responded:  5:45 AM  No new orders at this time, MD will be in to round on pt later this morning.

## 2016-11-18 NOTE — Progress Notes (Signed)
Subjective: 2 Days Post-Op Procedure(s) (LRB): RIGHT TOTAL KNEE ARTHROPLASTY (Right) Patient reports pain as 3 on 0-10 scale.Very slow in ambulation.May need another day of PT.    Objective: Vital signs in last 24 hours: Temp:  [97.5 F (36.4 C)-99.1 F (37.3 C)] 98.9 F (37.2 C) (02/16 0536) Pulse Rate:  [77-93] 87 (02/16 0536) Resp:  [16] 16 (02/16 0536) BP: (157-175)/(58-83) 157/65 (02/16 0536) SpO2:  [91 %-94 %] 93 % (02/16 0536)  Intake/Output from previous day: 02/15 0701 - 02/16 0700 In: 1380 [P.O.:430; I.V.:950] Out: 900 [Urine:900] Intake/Output this shift: No intake/output data recorded.   Recent Labs  11/17/16 0429 11/18/16 0427  HGB 12.1 11.6*    Recent Labs  11/17/16 0429 11/18/16 0427  WBC 11.4* 15.8*  RBC 3.75* 3.59*  HCT 33.7* 31.9*  PLT 157 158    Recent Labs  11/17/16 0429 11/18/16 0427  NA 136 130*  K 3.3* 2.7*  CL 95* 89*  CO2 30 29  BUN 21* 19  CREATININE 0.85 0.83  GLUCOSE 232* 212*  CALCIUM 9.3 8.5*   No results for input(s): LABPT, INR in the last 72 hours.  Dorsiflexion/Plantar flexion intact No cellulitis present Compartment soft  Assessment/Plan: 2 Days Post-Op Procedure(s) (LRB): RIGHT TOTAL KNEE ARTHROPLASTY (Right) Up with therapy discontinued today if she improves.  Inmer Nix A 11/18/2016, 9:15 AM

## 2016-11-18 NOTE — Evaluation (Signed)
Occupational Therapy Evaluation Patient Details Name: Doris Carr MRN: [id number] DOB: 02/17/1936 Today's Date: 11/18/2016    History of Present Illness s/p R TKA.  PMH:  DM with neuropathy, PVD, balance problems   Clinical Impression   This 81 year old female was admitted for the above sx. She will benefit from continued OT in acute setting.  Pt currently needs +2 assistance for transfers and standing for adls.  OT will focus on these things to decrease burden of care and ensure that husband can safely assist her at home    Follow Up Recommendations  Supervision/Assistance - 24 hour, as long as pt progresses enough for husband to safely assist her.  If not, then SNF   Equipment Recommendations  None recommended by OT    Recommendations for Other Services       Precautions / Restrictions Precautions Precautions: Knee Restrictions Weight Bearing Restrictions: No Other Position/Activity Restrictions: WBAT      Mobility Bed Mobility Overal bed mobility: Needs Assistance Bed Mobility: Supine to Sit     Supine to sit: Mod assist     General bed mobility comments: assist for RLE and assist to scoot to EOB  Transfers Overall transfer level: Needs assistance Equipment used: Rolling walker (2 wheeled) Transfers: Sit to/from [facility] Sit to Stand: Mod assist;+2 physical assistance;+2 safety/equipment Stand pivot transfers: Mod assist;+2 physical assistance;+2 safety/equipment       General transfer comment: pt fearful of falling and has posterior lean when she first stands.  Cues for UE placement, but pt was unable to push up from bed and 3:1 commode--pushed back to sit as she was fearful of transition (with +2 assist)    Balance                                            ADL Overall ADL's : Needs assistance/impaired     Grooming: Wash/dry hands;Set up;Sitting       Lower Body Bathing: Maximal assistance;Sit to/from  stand       Lower Body Dressing: Maximal assistance;Sit to/from stand   Toilet Transfer: Moderate assistance;+2 for physical assistance;+2 for safety/equipment;BSC;RW   Toileting- Clothing Manipulation and Hygiene: Total assistance;+2 for physical assistance;+2 for safety/equipment;Sit to/from stand         General ADL Comments: performed SPT to 3:1 commode.  Pt is able to perform UB adls with set up.  Unable to lift RLE without assistance for LB adls at this time.       Vision     Perception     Praxis      Pertinent Vitals/Pain Pain Assessment: Faces Faces Pain Scale: Hurts little more Pain Location: R knee Pain Descriptors / Indicators: Aching Pain Intervention(s): Limited activity within patient's tolerance;Monitored during session;Premedicated before session;Repositioned     Hand Dominance     Extremity/Trunk Assessment Upper Extremity Assessment Upper Extremity Assessment: Generalized weakness           Communication Communication Communication: No difficulties   Cognition Arousal/Alertness: Awake/alert Behavior During Therapy: WFL for tasks assessed/performed Overall Cognitive Status:  (repeated cues at times)                     General Comments       Exercises       Shoulder Instructions      Home Living Family/patient expects to  be discharged to:: Private residence Living Arrangements: Spouse/significant other Available Help at Discharge: Family   Home Access: Stairs to enter Technical brewer of Steps: 1   [place]: One level     Bathroom Shower/Tub: Occupational psychologist: [place]: Environmental consultant - 2 wheels;Bedside commode          Prior Functioning/Environment Level of Independence: Independent                 OT Problem List: Decreased strength;Decreased activity tolerance;Impaired balance (sitting and/or standing);Decreased knowledge of use of DME or AE;Decreased safety  awareness;Pain   OT Treatment/Interventions: Self-care/ADL training;DME and/or AE instruction;Patient/family education;Balance training;Therapeutic activities    OT Goals(Current goals can be found in the care plan section) Acute Rehab OT Goals Patient Stated Goal: none stated OT Goal Formulation: With patient Time For Goal Achievement: 11/25/16 Potential to Achieve Goals: Good ADL Goals Pt Will Perform Grooming: with min guard assist;standing Pt Will Transfer to Toilet: with min assist;ambulating;stand pivot transfer;bedside commode Pt Will Perform Toileting - Clothing Manipulation and hygiene: with mod assist;sit to/from stand Pt Will Perform Tub/Shower Transfer: Shower transfer;with min assist;ambulating;shower seat (vs verbalize sequence, if not ready) Additional ADL Goal #1: pt will go from sit to stand with min A and maintain with min guard for adls for 2 minutes  OT Frequency: Min 2X/week   Barriers to D/C:            Co-evaluation PT/OT/SLP Co-Evaluation/Treatment: Yes Reason for Co-Treatment: For patient/therapist safety PT goals addressed during session: Mobility/safety with mobility OT goals addressed during session: ADL's and self-care      End of Session    Activity Tolerance: Patient limited by fatigue Patient left: in chair;with call bell/phone within reach;with chair alarm set;with family/visitor present   Time: [id number] OT Time Calculation (min): 19 min Charges:  OT General Charges $OT Visit: 1 Procedure OT Evaluation $OT Eval Low Complexity: 1 Procedure G-Codes:    Karmyn Lowman 11/20/16, 9:01 AM   Lesle Chris, OTR/L [phone] 11-20-16

## 2016-11-18 NOTE — Progress Notes (Signed)
Physical Therapy Treatment Patient Details Name: Doris Carr MRN: [id number] DOB: 03-04-1936 Today's Date: 11/18/2016    History of Present Illness s/p R TKA.  PMH:  DM with neuropathy, PVD, balance problems    PT Comments    The  Patient is progressing slowly. Required multimodal cues for ambulating x 15' with 2 assist. The patient most likely will require several more sessions of therapy in order to achieve a functional level for spouse to care for. Recommend HHPT. The patient is very drowsy and falling asleep.  Follow Up Recommendations  Home health PT;Supervision/Assistance - 24 hour     Equipment Recommendations  None recommended by PT    Recommendations for Other Services       Precautions / Restrictions Precautions Precautions: Knee Precaution Comments: balance    Mobility  Bed Mobility Overal bed mobility: (P)  Bed Mobility: (P) Sit to Supine     Supine to sit: Mod assist Sit to supine: (P) Mod assist   General bed mobility comments: (P) assist for RLE and assist to scoot   Transfers Overall transfer level: (P) Needs assistance Equipment used: (P) Rolling walker (2 wheeled) Transfers: (P) Sit to/from Stand;Stand Pivot Transfers Sit to Stand: (P) Mod assist;+2 physical assistance;+2 safety/equipment Stand pivot transfers: Mod assist;+2 physical assistance;+2 safety/equipment       General transfer comment: Mod assist to stand from recliner. stability required for balance. Assist to sit down to the bed, multimodal cues forhand and  right leg position  Ambulation/Gait Ambulation/Gait assistance: Mod assist;Max assist;+2 physical assistance;+2 safety/equipment Ambulation Distance (Feet): 15 Feet Assistive device: Rolling walker (2 wheeled) Gait Pattern/deviations: Step-to pattern     General Gait Details: multimodal cues  for hand placement and step sequence almost every step. Poor control of the  RW. PT required to hold the RW back as patient pushing it  ahead. Patient with balance loss. patient complained of fatigue, dizziness and wanting to sit down before getting to the bed. Family present and express concern for DC to home.    Stairs            Wheelchair Mobility    Modified Rankin (Stroke Patients Only)       Balance                                    Cognition Arousal/Alertness: Lethargic Behavior During Therapy: WFL for tasks assessed/performed                   General Comments: required frequent cues to stay awake    Exercises Total Joint Exercises- All exercises require constant cueing. Ankle Circles/Pumps: AROM;Both;10 reps Heel Slides: AAROM;Right;10 reps Hip ABduction/ADduction: AAROM;Right;10 reps Straight Leg Raises: AAROM;Right;15 reps Goniometric ROM: 10-45 right knee    General Comments        Pertinent Vitals/Pain Faces Pain Scale: Hurts even more Pain Location: R knee Pain Descriptors / Indicators: Aching;Grimacing Pain Intervention(s): Monitored during session;Premedicated before session    Home Living Family/patient expects to be discharged to:: Private residence Living Arrangements: Spouse/significant other   Type of Home: House Home Access: Stairs to enter   Home Layout: One level Home Equipment: Environmental consultant - 2 wheels;Bedside commode      Prior Function Level of Independence: Independent      Comments: has falls due to balance   PT Goals (current goals can now be found in the care plan section) Acute  Rehab PT Goals Patient Stated Goal: to get to bathroom PT Goal Formulation: With patient/family Time For Goal Achievement: 11/25/16 Potential to Achieve Goals: Good Progress towards PT goals: Progressing toward goals    Frequency    7X/week      PT Plan Current plan remains appropriate    Co-evaluation PT/OT/SLP Co-Evaluation/Treatment: Yes Reason for Co-Treatment: For patient/therapist safety PT goals addressed during session: Mobility/safety  with mobility       End of Session Equipment Utilized During Treatment: Gait belt Activity Tolerance: Patient limited by fatigue Patient left: in bed;with call bell/phone within reach;with bed alarm set;with family/visitor present     Time: [id number] PT Time Calculation (min) (ACUTE ONLY): 33 min  Charges:  $Gait Training: 8-22 mins $Therapeutic Exercise: 8-22 mins                    G Codes:      [name] 11/18/2016, 1:12 PM [name] PT [phone]

## 2016-11-18 NOTE — Evaluation (Signed)
Physical Therapy Evaluation Patient Details Name: Doris Carr MRN: [id number] DOB: 08-27-1936 Today's Date: 11/18/2016   History of Present Illness  s/p R TKA.  PMH:  DM with neuropathy, PVD, balance problems  Clinical Impression  The paTIENT  IS progressing slowly. Missed yesterdays therapy altogether.Pt admitted with above diagnosis. Pt currently with functional limitations due to the deficits listed below (see PT Problem List).  Pt will benefit from skilled PT to increase their independence and safety with mobility to allow discharge to the venue listed below.       Follow Up Recommendations Home health PT;Supervision/Assistance - 24 hour (going to OPPT will be a hardship)    Equipment Recommendations       Recommendations for Other Services       Precautions / Restrictions Precautions Precautions: Knee Precaution Comments: balance      Mobility  Bed Mobility Overal bed mobility: Needs Assistance Bed Mobility: Supine to Sit     Supine to sit: Mod assist     General bed mobility comments: assist for RLE and assist to scoot to EOB  Transfers Overall transfer level: Needs assistance Equipment used: Rolling walker (2 wheeled) Transfers: Sit to/from [facility] Sit to Stand: Mod assist;+2 physical assistance;+2 safety/equipment Stand pivot transfers: Mod assist;+2 physical assistance;+2 safety/equipment       General transfer comment: pt fearful of falling and has posterior lean when she first stands.  Cues for UE placement, but pt was unable to push up from bed and 3:1 commode--pushed back to sit as she was fearful of transition (with +2 assist) multimodal cues for safety, position inside the RW. Transfer to [facility] and recliner brought up .  Ambulation/Gait                Stairs            Wheelchair Mobility    Modified Rankin (Stroke Patients Only)       Balance                                              Pertinent Vitals/Pain Faces Pain Scale: Hurts little more Pain Location: R knee Pain Descriptors / Indicators: Aching Pain Intervention(s): Monitored during session;Premedicated before session;Repositioned    Home Living Family/patient expects to be discharged to:: Private residence Living Arrangements: Spouse/significant other   Type of Home: House Home Access: Stairs to enter   Technical brewer of Steps: 1 Home Layout: One level Home Equipment: Environmental consultant - 2 wheels;Bedside commode      Prior Function Level of Independence: Independent         Comments: has falls due to balance     Hand Dominance        Extremity/Trunk Assessment   Upper Extremity Assessment Upper Extremity Assessment: Generalized weakness    Lower Extremity Assessment Lower Extremity Assessment: RLE deficits/detail RLE Deficits / Details: right knee  is flexed in KI, decreasd weight tolerated.    Cervical / Trunk Assessment Cervical / Trunk Assessment: Normal  Communication   Communication: No difficulties  Cognition Arousal/Alertness: Awake/alert Behavior During Therapy: WFL for tasks assessed/performed                   General Comments:  multimodal cues    General Comments      Exercises     Assessment/Plan    PT Assessment Patient  needs continued PT services  PT Problem List Decreased strength;Decreased range of motion;Decreased activity tolerance;Decreased balance;Decreased mobility;Decreased knowledge of precautions;Decreased safety awareness;Decreased knowledge of use of DME;Pain          PT Treatment Interventions DME instruction;Gait training;Stair training;Functional mobility training;Therapeutic activities;Therapeutic exercise;Patient/family education    PT Goals (Current goals can be found in the Care Plan section)  Acute Rehab PT Goals Patient Stated Goal: to get to bathroom PT Goal Formulation: With patient/family Time For Goal Achievement:  11/25/16 Potential to Achieve Goals: Good    Frequency 7X/week   Barriers to discharge Decreased caregiver support      Co-evaluation PT/OT/SLP Co-Evaluation/Treatment: Yes Reason for Co-Treatment: For patient/therapist safety PT goals addressed during session: Mobility/safety with mobility         End of Session   Activity Tolerance: Patient limited by fatigue;Patient limited by pain Patient left: in chair;with call bell/phone within reach;with chair alarm set Nurse Communication: Mobility status         Time: [id number] PT Time Calculation (min) (ACUTE ONLY): 15 min   Charges:   PT Evaluation $PT Eval Low Complexity: 1 Procedure     PT G CodesMarcelino Freestone PT [id number]  11/18/2016, 12:30 PM

## 2016-11-18 NOTE — Progress Notes (Signed)
Date: November 18, 2016 Discharge orders checked for needs. PT to be done as outpt. No case management needs present at time of discharge. Velva Harman, RN, BSN, [place]   [phone]

## 2016-11-18 NOTE — Progress Notes (Signed)
Physical Therapy Treatment Patient Details Name: Doris Carr MRN: [id number] DOB: October 10, 1935 Today's Date: 11/18/2016    History of Present Illness s/p R TKA.  PMH:  DM with neuropathy, PVD, balance problems    PT Comments    POD # 2 pm session Pt progressing slowly.  Pt plans to return home with family(spouse/son/daughter).  Pt lives with spouse and 2 children are from out of town.  Assisted OOB to amb to bathroom was difficult.  Very unsteady gait with impaired cognition requiring 100% VC's on proper walker use and sequencing.  Pt let go of walker 3 time to grab wall, sink etc.  Required hand over hand direction when VC was unsuccessful.  Max c/o dizziness but BP was good 120/64 with HR 84.  Pt was only able to amb to bathroom and back as this activity completely exhausted her.  Assisted back to bed and had spouse "hands on" assist his wife with instruction on proper tech and assist R LE.  Positioned to comfort then applied ICE.  Pt will need 24/7 care at home and do NOT rec she amb on her own.  Spouse states pt would steady herself on the furniture etc and has never used a walker.    Follow Up Recommendations  Home health PT;Supervision/Assistance - 24 hour     Equipment Recommendations  None recommended by PT    Recommendations for Other Services       Precautions / Restrictions Precautions Precautions: Knee Precaution Comments: balance Restrictions Weight Bearing Restrictions: No Other Position/Activity Restrictions: WBAT    Mobility  Bed Mobility Overal bed mobility: Needs Assistance Bed Mobility: Supine to Sit;Sit to Supine     Supine to sit: Max assist Sit to supine: Max assist;Total assist   General bed mobility comments: attempted to teach son "hands on" assist his mother OOB but son was too fast/impulsive and unsafe tecg despite redirection so then excused as I demonstrated proper slow tech and hand placement.    Transfers Overall transfer level: Needs  assistance Equipment used: Rolling walker (2 wheeled) Transfers: Sit to/from [facility] Sit to Stand: Max assist;+2 physical assistance;+2 safety/equipment Stand pivot transfers: Max assist;+2 physical assistance;+2 safety/equipment       General transfer comment: 100% VC's for proper hand placement, turn completion, proper walker to self distance.  Pt demonstartes impaired cognition and required 50%  hand over hand dierction when VC's were not helping.  Unsteady.  Letting go of the walker x 3.    Ambulation/Gait Ambulation/Gait assistance: Max assist;+2 physical assistance;+2 safety/equipment Ambulation Distance (Feet): 12 Feet (to and from bathroom only.  ) Assistive device: Rolling walker (2 wheeled) Gait Pattern/deviations: Step-to pattern;Step-through pattern Gait velocity: decreased    General Gait Details: 100% VC's on proper walker to self distance.  75% physical assist for therapist to decrease walker to self distance as pt kept advancing walker but not her feet.   HIGH FALL RISK.  Max c/o dizziness and c/o feel warm with exersion.  BP 120/64 and HR 84.     Stairs            Wheelchair Mobility    Modified Rankin (Stroke Patients Only)       Balance                                    Cognition Arousal/Alertness:  (50% awake/50% lethargic) Behavior During Therapy: WFL for tasks  assessed/performed                   General Comments: required frequent cues to stay awake    Exercises Total Joint Exercises Ankle Circles/Pumps: AROM;Both;10 reps Heel Slides: AAROM;Right;10 reps Hip ABduction/ADduction: AAROM;Right;10 reps Straight Leg Raises: AAROM;Right;15 reps Goniometric ROM: 10-45 right knee    General Comments        Pertinent Vitals/Pain Pain Assessment: Faces Faces Pain Scale: Hurts even more Pain Location: R knee Pain Descriptors / Indicators: Aching;Operative site guarding;Grimacing Pain Intervention(s):  Monitored during session;Repositioned;Ice applied    Home Living Family/patient expects to be discharged to:: Private residence Living Arrangements: Spouse/significant other   Type of Home: House Home Access: Stairs to enter   Home Layout: One level Home Equipment: Environmental consultant - 2 wheels;Bedside commode      Prior Function Level of Independence: Independent      Comments: has falls due to balance   PT Goals (current goals can now be found in the care plan section) Acute Rehab PT Goals Patient Stated Goal: to get to bathroom PT Goal Formulation: With patient/family Time For Goal Achievement: 11/25/16 Potential to Achieve Goals: Good Progress towards PT goals: Progressing toward goals    Frequency    7X/week      PT Plan Current plan remains appropriate    Co-evaluation PT/OT/SLP Co-Evaluation/Treatment: Yes Reason for Co-Treatment: For patient/therapist safety PT goals addressed during session: Mobility/safety with mobility       End of Session Equipment Utilized During Treatment: Gait belt Activity Tolerance: Patient limited by fatigue Patient left: in bed;with call bell/phone within reach;with bed alarm set;with family/visitor present     Time: 1410-1440 PT Time Calculation (min) (ACUTE ONLY): 30 min  Charges:  $Gait Training: 8-22 mins $Therapeutic Activity: 8-22 mins                    G Codes:      Rica Koyanagi  PTA [facility]  Acute  Rehab Pager      [phone]

## 2016-11-19 LAB — CBC
HCT: 29.8 % — ABNORMAL LOW (ref 36.0–46.0)
HEMOGLOBIN: 11 g/dL — AB (ref 12.0–15.0)
MCH: 32.5 pg (ref 26.0–34.0)
MCHC: 36.9 g/dL — ABNORMAL HIGH (ref 30.0–36.0)
MCV: 88.2 fL (ref 78.0–100.0)
Platelets: 144 10*3/uL — ABNORMAL LOW (ref 150–400)
RBC: 3.38 MIL/uL — AB (ref 3.87–5.11)
RDW: 11.9 % (ref 11.5–15.5)
WBC: 15.9 10*3/uL — ABNORMAL HIGH (ref 4.0–10.5)

## 2016-11-19 LAB — BASIC METABOLIC PANEL
ANION GAP: 10 (ref 5–15)
BUN: 25 mg/dL — ABNORMAL HIGH (ref 6–20)
CALCIUM: 8 mg/dL — AB (ref 8.9–10.3)
CO2: 29 mmol/L (ref 22–32)
Chloride: 87 mmol/L — ABNORMAL LOW (ref 101–111)
Creatinine, Ser: 0.91 mg/dL (ref 0.44–1.00)
GFR, EST NON AFRICAN AMERICAN: 59 mL/min — AB (ref >60)
Glucose, Bld: 235 mg/dL — ABNORMAL HIGH (ref 65–99)
POTASSIUM: 2.6 mmol/L — AB (ref 3.5–5.1)
Sodium: 126 mmol/L — ABNORMAL LOW (ref 135–145)

## 2016-11-19 LAB — GLUCOSE, CAPILLARY
GLUCOSE-CAPILLARY: 201 mg/dL — AB (ref 65–99)
GLUCOSE-CAPILLARY: 245 mg/dL — AB (ref 65–99)
Glucose-Capillary: 232 mg/dL — ABNORMAL HIGH (ref 65–99)
Glucose-Capillary: 172 mg/dL — ABNORMAL HIGH (ref 65–99)

## 2016-11-19 MED ORDER — POTASSIUM CHLORIDE CRYS ER 20 MEQ PO TBCR
40.0000 meq | EXTENDED_RELEASE_TABLET | ORAL | Status: AC
  Administered 2016-11-19 (×3): 40 meq via ORAL
  Filled 2016-11-19 (×3): qty 2

## 2016-11-19 NOTE — Progress Notes (Addendum)
Subjective: 3 Days Post-Op Procedure(s) (LRB): RIGHT TOTAL KNEE ARTHROPLASTY (Right) Patient reports pain as mild.   Patient seen in rounds with Dr. Wynelle Link. Patient is well, and has had no acute complaints or problems Patient is ready to go home if does well Recheck potassium this morning.  Plan later today if labs okay  Objective: Vital signs in last 24 hours: Temp:  [98 F (36.7 C)-98.3 F (36.8 C)] 98.3 F (36.8 C) (02/17 0511) Pulse Rate:  [78-88] 82 (02/17 0511) Resp:  [16] 16 (02/17 0511) BP: (131-155)/(63-65) 142/63 (02/17 0511) SpO2:  [94 %-95 %] 95 % (02/17 0511)  Intake/Output from previous day:  Intake/Output Summary (Last 24 hours) at 11/19/16 0737 Last data filed at 11/19/16 0524  Gross per 24 hour  Intake              660 ml  Output              325 ml  Net              335 ml    Intake/Output this shift: No intake/output data recorded.  Labs:  Recent Labs  11/17/16 0429 11/18/16 0427 11/19/16 0446  HGB 12.1 11.6* 11.0*    Recent Labs  11/18/16 0427 11/19/16 0446  WBC 15.8* 15.9*  RBC 3.59* 3.38*  HCT 31.9* 29.8*  PLT 158 144*    Recent Labs  11/17/16 0429 11/18/16 0427  NA 136 130*  K 3.3* 2.7*  CL 95* 89*  CO2 30 29  BUN 21* 19  CREATININE 0.85 0.83  GLUCOSE 232* 212*  CALCIUM 9.3 8.5*   No results for input(s): LABPT, INR in the last 72 hours.  EXAM: General - Patient is Alert, Appropriate and Oriented Extremity - Neurovascular intact Sensation intact distally Incision - clean, dry, no drainage Motor Function - intact, moving foot and toes well on exam.   Assessment/Plan: 3 Days Post-Op Procedure(s) (LRB): RIGHT TOTAL KNEE ARTHROPLASTY (Right) Procedure(s) (LRB): RIGHT TOTAL KNEE ARTHROPLASTY (Right) Past Medical History:  Diagnosis Date  . Anxiety   . Arthritis    osteoarthritis  . Back pain    sees a neurosurgeon  . Balance problems   . Chronic diastolic heart failure (HCC)    Echo (8/12) with EF 60-65%,  moderate LV hypertrophy, mild LVH  . 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 CKD (chronic kidney disease)    likely due to DM2 and HTN  . Current tear of meniscus right knee  . Depression   . Diabetes mellitus    type  2  . GERD (gastroesophageal reflux disease)   . History of hiatal hernia    inguinal hernia  . HLD (hyperlipidemia)    myalgias with statins  . Hypertension    This has been difficult to control. She has had renal artery doppler evaluation on two different occasions, both times with no evidence for renal artery stenosis. She has had urinary catecholeamine collection that was unremarkable. Edema with 10 mg amlodipine.   . Left bundle branch block   . Lower extremity edema    CHRONIC  . Lung nodule    VATS and biopsy in 2006 that was negative for malignancy  . Obesity   . Peripheral vascular disease ([place])    Lower extremity evaluation (12/12): ABI 0.79 right, 0.64 left; > 50% mid left SFA stenosis;  s/p L SFA stent and stent to R CIA 11/2011; ABI's post PTA -  R 0.90, L 0.96 (TBI on R 0.51, L 0.61)  . Pneumonia    multiple times in childhood  . PONV (postoperative nausea and vomiting)   . S/P cardiac catheterization    Left heart cath in 6/06 with normal coronaries, EF 60%.  . Syncope and collapse    Active Problems:   History of total knee arthroplasty, right  Estimated body mass index is 27.17 kg/m as calculated from the following:   Height as of this encounter: 5\' 3"  (1.6 m).   Weight as of this encounter: 69.6 kg (153 lb 6 oz). Up with therapy Plan for discharge tomorrow Diet - Cardiac diet, Clear liquid diet, advance as tolerated and Renal diet Follow up - in 2 weeks Activity - WBAT Disposition - Home Condition Upon Discharge - Stable D/C Meds - See DC Summary DVT Prophylaxis - Xarelto  Arlee Muslim, PA-C Orthopaedic Surgery 11/19/2016, 7:37 AM   Potassium is low on repeat labs this morning. Keep patient today.   Replete  with K+ supplements and recheck labs in the morning.  Arlee Muslim, PA-C

## 2016-11-19 NOTE — Progress Notes (Signed)
CRITICAL VALUE ALERT  Critical value received:  potassium  2.6   Date of notification: 11/20/15   Time of notification:  0850  Critical value read back: yes  Nurse who received alert:  K. Atilano Median, RN  MD notified (1st page):  Arlee Muslim (paged by Joellen Jersey RN) Time of first page:  [phone] MD notified (2nd page):  Time of second page:  Responding MD: Arlee Muslim, PA  Time MD responded: [phone]

## 2016-11-19 NOTE — Progress Notes (Signed)
PT Cancellation Note  Patient Details Name: Doris Carr MRN: [id number] DOB: 12/19/1935   Cancelled Treatment:    Reason Eval/Treat Not Completed: Fatigue/lethargy limiting ability to participate, has ambulated to BR multiple times. Will return in AM and work with spouse on safety walking.   [name] 11/19/2016, 4:54 PM [name] PT [phone]

## 2016-11-19 NOTE — Progress Notes (Signed)
Occupational Therapy Treatment Patient Details Name: Doris Carr MRN: [id number] DOB: 1936/02/19 Today's Date: 11/19/2016    History of present illness s/p R TKA.  PMH:  DM with neuropathy, PVD, balance problems   OT comments  Pt currently mod A with bathroom transfer.  Pt needed Max Vc for safety and technique  Follow Up Recommendations  Home health OT;Supervision/Assistance - 24 hour    Equipment Recommendations  None recommended by OT       Precautions / Restrictions Precautions Precautions: Knee Precaution Comments: balance Restrictions Weight Bearing Restrictions: No Other Position/Activity Restrictions: WBAT       Mobility Bed Mobility Overal bed mobility: Needs Assistance Bed Mobility: Sit to Supine       Sit to supine: Mod assist      Transfers Overall transfer level: Needs assistance Equipment used: Rolling walker (2 wheeled) Transfers: Sit to/from [facility] Sit to Stand: Mod assist Stand pivot transfers: Mod assist       General transfer comment: constant VC for safety with walker during OT session        ADL Overall ADL's : Needs assistance/impaired             Lower Body Bathing: Moderate assistance;Cueing for compensatory techniques;Cueing for sequencing;Cueing for safety;Sit to/from stand           Toilet Transfer: Moderate assistance;RW;Ambulation;Cueing for sequencing;Cueing for safety;Comfort height toilet Toilet Transfer Details (indicate cue type and reason): VC for walker safety- pt tends to let walker too far in front of her. VC to stay with walker Toileting- Clothing Manipulation and Hygiene: Moderate assistance;Sit to/from stand;Cueing for safety;Cueing for sequencing;Cueing for compensatory techniques         General ADL Comments: pt exhausted after walking to bathroom and requested to go to bed                            General Comments      Pertinent Vitals/ Pain       Pain Score: 4   Pain Location: R knee Pain Intervention(s): Monitored during session;Repositioned;Ice applied         Frequency  Min 2X/week        Progress Toward Goals  OT Goals(current goals can now be found in the care plan section)  Progress towards OT goals: Progressing toward goals     Plan Discharge plan needs to be updated       End of Session Equipment Utilized During Treatment: Rolling walker   Activity Tolerance Patient tolerated treatment well   Patient Left in bed   Nurse Communication Mobility status        Time: [id number] OT Time Calculation (min): 25 min  Charges: OT General Charges $OT Visit: 1 Procedure OT Treatments $Self Care/Home Management : 23-37 mins  Arian Murley D 11/19/2016, 11:11 AM

## 2016-11-19 NOTE — Progress Notes (Signed)
NCM spoke to pt and husband at bedside. PT recommended HHPT. Contacted attending PA with updated for [facility] orders. Offered choice for [facility] PT/list provided. Pt agreeable to [facility] for [facility]. Contacted [facility] Liaison with new referral. Jonnie Finner RN CCM Case Mgmt phone [phone]

## 2016-11-19 NOTE — Care Management Important Message (Signed)
Important Message  Patient Details  Name: RHYEN MCMICKENS MRN: [id number] Date of Birth: 1936-01-25   Medicare Important Message Given:  Yes    Erenest Rasher, RN 11/19/2016, 9:41 AM

## 2016-11-19 NOTE — Progress Notes (Signed)
Physical Therapy Treatment Patient Details Name: Doris Carr MRN: [id number] DOB: [birth year] Today's Date: 11/19/2016    History of Present Illness s/p R TKA.  PMH:  DM with neuropathy, PVD, balance problems    PT Comments    POD # 3 Pt progressing poorly with MAX c/o fatigue and impaired cognition (meds?).  Spouse present for session for "hands on" education on all listed below.  Very unsteady gait.  HIGH FALL RISK.  Pt not safe to D/C to home today.    Follow Up Recommendations  Home health PT;Supervision/Assistance - 24 hour     Equipment Recommendations  None recommended by PT    Recommendations for Other Services       Precautions / Restrictions Precautions Precautions: Knee Precaution Comments: instructed on KI use and proper application "hands on" with spouse Restrictions Weight Bearing Restrictions: No Other Position/Activity Restrictions: WBAT    Mobility  Bed Mobility Overal bed mobility: Needs Assistance Bed Mobility: Supine to Sit     Supine to sit: Mod assist Sit to supine: Mod assist   General bed mobility comments: had spouse "hands on" assist pt OOB.  Instruction on safe handling and proper tech.   Transfers Overall transfer level: Needs assistance Equipment used: Rolling walker (2 wheeled) Transfers: Sit to/from [facility] Sit to Stand: Mod assist Stand pivot transfers: Mod assist       General transfer comment: had spouse "hands on" assist pt off bed and toileting in balthroom.  Instructed on safe handling and pt was very unsteady with impaired safety cognition, letting go od walker and reaching beyond her BOS for sink.  Instructed on safe handling with spouse and proper walker use.  LOB x 3 in bathroom, spouse recovered.   HIGH FALL RISK.   Ambulation/Gait Ambulation/Gait assistance: Max assist;Mod assist Ambulation Distance (Feet): 25 Feet Assistive device: Rolling walker (2 wheeled) Gait Pattern/deviations: Step-to  pattern Gait velocity: decreased    General Gait Details: has spouse "hans on" assist pt with instruction on safe handling and proper walker distance.  Pt very unsteady demonstrating impaired safety cognition (meds?) requiring 100% VC's for direction.  LOB x 2 with amb with poor self corrective reaction.  HIGH FALL RISK.   Stairs            Wheelchair Mobility    Modified Rankin (Stroke Patients Only)       Balance                                    Cognition                       General Comments: requires 75% VC's for safety and direction    Exercises   Total Knee Replacement TE's 10 reps B LE ankle pumps 10 reps towel squeezes  Unable to complete all due to fatigue Followed by ICE     General Comments        Pertinent Vitals/Pain Pain Assessment: Faces Pain Score: 4  Faces Pain Scale: Hurts a little bit Pain Location: R knee Pain Descriptors / Indicators: Aching;Operative site guarding;Grimacing Pain Intervention(s): Monitored during session;Repositioned;Ice applied    Home Living                      Prior Function            PT Goals (current goals can  now be found in the care plan section) Progress towards PT goals: Progressing toward goals    Frequency    7X/week      PT Plan Current plan remains appropriate    Co-evaluation             End of Session Equipment Utilized During Treatment: Gait belt Activity Tolerance: Patient limited by fatigue Patient left: in chair;with chair alarm set;with call bell/phone within reach;with family/visitor present     Time: [id number] PT Time Calculation (min) (ACUTE ONLY): 38 min  Charges:  $Gait Training: 8-22 mins $Therapeutic Exercise: 8-22 mins $Therapeutic Activity: 8-22 mins                    G Codes:      Rica Koyanagi  PTA [facility]  Acute  Rehab Pager      [phone]

## 2016-11-20 LAB — BASIC METABOLIC PANEL
Anion gap: 10 (ref 5–15)
BUN: 21 mg/dL — ABNORMAL HIGH (ref 6–20)
CALCIUM: 8.4 mg/dL — AB (ref 8.9–10.3)
CO2: 28 mmol/L (ref 22–32)
CREATININE: 0.87 mg/dL (ref 0.44–1.00)
Chloride: 91 mmol/L — ABNORMAL LOW (ref 101–111)
GFR calc Af Amer: >60 mL/min (ref >60)
GLUCOSE: 238 mg/dL — AB (ref 65–99)
Potassium: 3.7 mmol/L (ref 3.5–5.1)
Sodium: 129 mmol/L — ABNORMAL LOW (ref 135–145)

## 2016-11-20 LAB — CBC
HCT: 29.3 % — ABNORMAL LOW (ref 36.0–46.0)
Hemoglobin: 10.7 g/dL — ABNORMAL LOW (ref 12.0–15.0)
MCH: 32.7 pg (ref 26.0–34.0)
MCHC: 36.5 g/dL — AB (ref 30.0–36.0)
MCV: 89.6 fL (ref 78.0–100.0)
PLATELETS: 169 10*3/uL (ref 150–400)
RBC: 3.27 MIL/uL — ABNORMAL LOW (ref 3.87–5.11)
RDW: 12.1 % (ref 11.5–15.5)
WBC: 11.3 10*3/uL — ABNORMAL HIGH (ref 4.0–10.5)

## 2016-11-20 LAB — GLUCOSE, CAPILLARY: Glucose-Capillary: 214 mg/dL — ABNORMAL HIGH (ref 65–99)

## 2016-11-20 NOTE — Discharge Summary (Signed)
Physician Discharge Summary   Patient ID: Doris Carr MRN: [id number] DOB/AGE: 1936/07/14 81 y.o.  Admit date: 11/16/2016 Discharge date:   Primary Diagnosis: right knee primary osteoarthritis  Admission Diagnoses:  Past Medical History:  Diagnosis Date  . Anxiety   . Arthritis    osteoarthritis  . Back pain    sees a neurosurgeon  . Balance problems   . Chronic diastolic heart failure (HCC)    Echo (8/12) with EF 60-65%, moderate LV hypertrophy, mild LVH  . 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 CKD (chronic kidney disease)    likely due to DM2 and HTN  . Current tear of meniscus right knee  . Depression   . Diabetes mellitus    type  2  . GERD (gastroesophageal reflux disease)   . History of hiatal hernia    inguinal hernia  . HLD (hyperlipidemia)    myalgias with statins  . Hypertension    This has been difficult to control. She has had renal artery doppler evaluation on two different occasions, both times with no evidence for renal artery stenosis. She has had urinary catecholeamine collection that was unremarkable. Edema with 10 mg amlodipine.   . Left bundle branch block   . Lower extremity edema    CHRONIC  . Lung nodule    VATS and biopsy in 2006 that was negative for malignancy  . Obesity   . Peripheral vascular disease ([place])    Lower extremity evaluation (12/12): ABI 0.79 right, 0.64 left; > 50% mid left SFA stenosis;  s/p L SFA stent and stent to R CIA 11/2011; ABI's post PTA - R 0.90, L 0.96 (TBI on R 0.51, L 0.61)  . Pneumonia    multiple times in childhood  . PONV (postoperative nausea and vomiting)   . S/P cardiac catheterization    Left heart cath in 6/06 with normal coronaries, EF 60%.  . Syncope and collapse    Discharge Diagnoses:   Active Problems:   History of total knee arthroplasty, right  Estimated body mass index is 27.17 kg/m as calculated from the following:   Height as of this encounter: 5' 3" (1.6 m).  Weight as of this encounter: 69.6 kg (153 lb 6 oz).  Procedure:  Procedure(s) (LRB): RIGHT TOTAL KNEE ARTHROPLASTY (Right)   Consults: None  HPI: see H&P Laboratory Data: Admission on 11/16/2016  Component Date Value Ref Range Status  . Glucose-Capillary 11/16/2016 157* 65 - 99 mg/dL Final  . Glucose-Capillary 11/16/2016 158* 65 - 99 mg/dL Final  . WBC 11/17/2016 11.4* 4.0 - 10.5 K/uL Final  . RBC 11/17/2016 3.75* 3.87 - 5.11 MIL/uL Final  . Hemoglobin 11/17/2016 12.1  12.0 - 15.0 g/dL Final  . HCT 11/17/2016 33.7* 36.0 - 46.0 % Final  . MCV 11/17/2016 89.9  78.0 - 100.0 fL Final  . MCH 11/17/2016 32.3  26.0 - 34.0 pg Final  . MCHC 11/17/2016 35.9  30.0 - 36.0 g/dL Final  . RDW 11/17/2016 12.0  11.5 - 15.5 % Final  . Platelets 11/17/2016 157  150 - 400 K/uL Final  . Sodium 11/17/2016 136  135 - 145 mmol/L Final  . Potassium 11/17/2016 3.3* 3.5 - 5.1 mmol/L Final  . Chloride 11/17/2016 95* 101 - 111 mmol/L Final  . CO2 11/17/2016 30  22 - 32 mmol/L Final  . Glucose, Bld 11/17/2016 232* 65 - 99 mg/dL Final  . BUN 11/17/2016 21* 6 - 20  mg/dL Final  . Creatinine, Ser 11/17/2016 0.85  0.44 - 1.00 mg/dL Final  . Calcium 11/17/2016 9.3  8.9 - 10.3 mg/dL Final  . GFR calc non Af Amer 11/17/2016 >60  >60 mL/min Final  . GFR calc Af Amer 11/17/2016 >60  >60 mL/min Final   Comment: (NOTE) The eGFR has been calculated using the CKD EPI equation. This calculation has not been validated in all clinical situations. eGFR's persistently <60 mL/min signify possible Chronic Kidney Disease.   . Anion gap 11/17/2016 11  5 - 15 Final  . Glucose-Capillary 11/16/2016 176* 65 - 99 mg/dL Final  . Comment 1 11/16/2016 Notify RN   Final  . Comment 2 11/16/2016 Document in Chart   Final  . Glucose-Capillary 11/17/2016 209* 65 - 99 mg/dL Final  . Glucose-Capillary 11/17/2016 195* 65 - 99 mg/dL Final  . WBC 11/18/2016 15.8* 4.0 - 10.5 K/uL Final  . RBC 11/18/2016 3.59* 3.87 - 5.11 MIL/uL Final  .  Hemoglobin 11/18/2016 11.6* 12.0 - 15.0 g/dL Final  . HCT 11/18/2016 31.9* 36.0 - 46.0 % Final  . MCV 11/18/2016 88.9  78.0 - 100.0 fL Final  . MCH 11/18/2016 32.3  26.0 - 34.0 pg Final  . MCHC 11/18/2016 36.4* 30.0 - 36.0 g/dL Final  . RDW 11/18/2016 12.0  11.5 - 15.5 % Final  . Platelets 11/18/2016 158  150 - 400 K/uL Final  . Sodium 11/18/2016 130* 135 - 145 mmol/L Final  . Potassium 11/18/2016 2.7* 3.5 - 5.1 mmol/L Final   Comment: RESULT REPEATED AND VERIFIED DELTA CHECK NOTED CRITICAL RESULT CALLED TO, READ BACK BY AND VERIFIED WITH: C ANDERSON RN @ 0528 ON 11/18/16 BY C DAVIS   . Chloride 11/18/2016 89* 101 - 111 mmol/L Final  . CO2 11/18/2016 29  22 - 32 mmol/L Final  . Glucose, Bld 11/18/2016 212* 65 - 99 mg/dL Final  . BUN 11/18/2016 19  6 - 20 mg/dL Final  . Creatinine, Ser 11/18/2016 0.83  0.44 - 1.00 mg/dL Final  . Calcium 11/18/2016 8.5* 8.9 - 10.3 mg/dL Final  . GFR calc non Af Amer 11/18/2016 >60  >60 mL/min Final  . GFR calc Af Amer 11/18/2016 >60  >60 mL/min Final   Comment: (NOTE) The eGFR has been calculated using the CKD EPI equation. This calculation has not been validated in all clinical situations. eGFR's persistently <60 mL/min signify possible Chronic Kidney Disease.   . Anion gap 11/18/2016 12  5 - 15 Final  . Glucose-Capillary 11/17/2016 213* 65 - 99 mg/dL Final  . Glucose-Capillary 11/17/2016 238* 65 - 99 mg/dL Final  . Glucose-Capillary 11/18/2016 204* 65 - 99 mg/dL Final  . Glucose-Capillary 11/18/2016 194* 65 - 99 mg/dL Final  . Glucose-Capillary 11/16/2016 161* 65 - 99 mg/dL Final  . WBC 11/19/2016 15.9* 4.0 - 10.5 K/uL Final  . RBC 11/19/2016 3.38* 3.87 - 5.11 MIL/uL Final  . Hemoglobin 11/19/2016 11.0* 12.0 - 15.0 g/dL Final  . HCT 11/19/2016 29.8* 36.0 - 46.0 % Final  . MCV 11/19/2016 88.2  78.0 - 100.0 fL Final  . MCH 11/19/2016 32.5  26.0 - 34.0 pg Final  . MCHC 11/19/2016 36.9* 30.0 - 36.0 g/dL Final  . RDW 11/19/2016 11.9  11.5 - 15.5 %  Final  . Platelets 11/19/2016 144* 150 - 400 K/uL Final  . Glucose-Capillary 11/18/2016 268* 65 - 99 mg/dL Final  . Glucose-Capillary 11/18/2016 266* 65 - 99 mg/dL Final  . Sodium 11/19/2016 126* 135 - 145 mmol/L   Final  . Potassium 11/19/2016 2.6* 3.5 - 5.1 mmol/L Final   Comment: CRITICAL RESULT CALLED TO, READ BACK BY AND VERIFIED WITH: CHEEK,K AT 8:50AM ON 11/19/16 BY FESTERMAN,C   . Chloride 11/19/2016 87* 101 - 111 mmol/L Final  . CO2 11/19/2016 29  22 - 32 mmol/L Final  . Glucose, Bld 11/19/2016 235* 65 - 99 mg/dL Final  . BUN 11/19/2016 25* 6 - 20 mg/dL Final  . Creatinine, Ser 11/19/2016 0.91  0.44 - 1.00 mg/dL Final  . Calcium 11/19/2016 8.0* 8.9 - 10.3 mg/dL Final  . GFR calc non Af Amer 11/19/2016 59* >60 mL/min Final  . GFR calc Af Amer 11/19/2016 >60  >60 mL/min Final   Comment: (NOTE) The eGFR has been calculated using the CKD EPI equation. This calculation has not been validated in all clinical situations. eGFR's persistently <60 mL/min signify possible Chronic Kidney Disease.   . Anion gap 11/19/2016 10  5 - 15 Final  . Glucose-Capillary 11/19/2016 232* 65 - 99 mg/dL Final  . Glucose-Capillary 11/19/2016 201* 65 - 99 mg/dL Final  . Glucose-Capillary 11/19/2016 245* 65 - 99 mg/dL Final  . Sodium 11/20/2016 129* 135 - 145 mmol/L Final  . Potassium 11/20/2016 3.7  3.5 - 5.1 mmol/L Final   Comment: DELTA CHECK NOTED REPEATED TO VERIFY NO VISIBLE HEMOLYSIS   . Chloride 11/20/2016 91* 101 - 111 mmol/L Final  . CO2 11/20/2016 28  22 - 32 mmol/L Final  . Glucose, Bld 11/20/2016 238* 65 - 99 mg/dL Final  . BUN 11/20/2016 21* 6 - 20 mg/dL Final  . Creatinine, Ser 11/20/2016 0.87  0.44 - 1.00 mg/dL Final  . Calcium 11/20/2016 8.4* 8.9 - 10.3 mg/dL Final  . GFR calc non Af Amer 11/20/2016 >60  >60 mL/min Final  . GFR calc Af Amer 11/20/2016 >60  >60 mL/min Final   Comment: (NOTE) The eGFR has been calculated using the CKD EPI equation. This calculation has not been  validated in all clinical situations. eGFR's persistently <60 mL/min signify possible Chronic Kidney Disease.   . Anion gap 11/20/2016 10  5 - 15 Final  . WBC 11/20/2016 11.3* 4.0 - 10.5 K/uL Final  . RBC 11/20/2016 3.27* 3.87 - 5.11 MIL/uL Final  . Hemoglobin 11/20/2016 10.7* 12.0 - 15.0 g/dL Final  . HCT 11/20/2016 29.3* 36.0 - 46.0 % Final  . MCV 11/20/2016 89.6  78.0 - 100.0 fL Final  . MCH 11/20/2016 32.7  26.0 - 34.0 pg Final  . MCHC 11/20/2016 36.5* 30.0 - 36.0 g/dL Final  . RDW 11/20/2016 12.1  11.5 - 15.5 % Final  . Platelets 11/20/2016 169  150 - 400 K/uL Final  . Glucose-Capillary 11/19/2016 172* 65 - 99 mg/dL Final  . Glucose-Capillary 11/20/2016 214* 65 - 99 mg/dL Final  Hospital Outpatient Visit on 11/08/2016  Component Date Value Ref Range Status  . Glucose-Capillary 11/08/2016 182* 65 - 99 mg/dL Final  . aPTT 11/08/2016 29  24 - 36 seconds Final  . WBC 11/08/2016 6.2  4.0 - 10.5 K/uL Final  . RBC 11/08/2016 3.95  3.87 - 5.11 MIL/uL Final  . Hemoglobin 11/08/2016 12.6  12.0 - 15.0 g/dL Final  . HCT 11/08/2016 36.4  36.0 - 46.0 % Final  . MCV 11/08/2016 92.2  78.0 - 100.0 fL Final  . MCH 11/08/2016 31.9  26.0 - 34.0 pg Final  . MCHC 11/08/2016 34.6  30.0 - 36.0 g/dL Final  . RDW 11/08/2016 12.0  11.5 - 15.5 % Final  .   Platelets 11/08/2016 165  150 - 400 K/uL Final  . Neutrophils Relative % 11/08/2016 67  % Final  . Neutro Abs 11/08/2016 4.2  1.7 - 7.7 K/uL Final  . Lymphocytes Relative 11/08/2016 19  % Final  . Lymphs Abs 11/08/2016 1.2  0.7 - 4.0 K/uL Final  . Monocytes Relative 11/08/2016 7  % Final  . Monocytes Absolute 11/08/2016 0.4  0.1 - 1.0 K/uL Final  . Eosinophils Relative 11/08/2016 6  % Final  . Eosinophils Absolute 11/08/2016 0.4  0.0 - 0.7 K/uL Final  . Basophils Relative 11/08/2016 1  % Final  . Basophils Absolute 11/08/2016 0.0  0.0 - 0.1 K/uL Final  . Sodium 11/08/2016 134* 135 - 145 mmol/L Final  . Potassium 11/08/2016 3.9  3.5 - 5.1 mmol/L  Final  . Chloride 11/08/2016 96* 101 - 111 mmol/L Final  . CO2 11/08/2016 29  22 - 32 mmol/L Final  . Glucose, Bld 11/08/2016 160* 65 - 99 mg/dL Final  . BUN 11/08/2016 31* 6 - 20 mg/dL Final  . Creatinine, Ser 11/08/2016 1.15* 0.44 - 1.00 mg/dL Final  . Calcium 11/08/2016 9.8  8.9 - 10.3 mg/dL Final  . Total Protein 11/08/2016 6.9  6.5 - 8.1 g/dL Final  . Albumin 11/08/2016 4.3  3.5 - 5.0 g/dL Final  . AST 11/08/2016 17  15 - 41 U/L Final  . ALT 11/08/2016 11* 14 - 54 U/L Final  . Alkaline Phosphatase 11/08/2016 44  38 - 126 U/L Final  . Total Bilirubin 11/08/2016 0.5  0.3 - 1.2 mg/dL Final  . GFR calc non Af Amer 11/08/2016 44* >60 mL/min Final  . GFR calc Af Amer 11/08/2016 51* >60 mL/min Final   Comment: (NOTE) The eGFR has been calculated using the CKD EPI equation. This calculation has not been validated in all clinical situations. eGFR's persistently <60 mL/min signify possible Chronic Kidney Disease.   . Anion gap 11/08/2016 9  5 - 15 Final  . Prothrombin Time 11/08/2016 13.1  11.4 - 15.2 seconds Final  . INR 11/08/2016 0.99   Final  . ABO/RH(D) 11/08/2016 A POS   Final  . Antibody Screen 11/08/2016 NEG   Final  . Sample Expiration 11/08/2016 11/19/2016   Final  . Extend sample reason 11/08/2016 NO TRANSFUSIONS OR PREGNANCY IN THE PAST 3 MONTHS   Final  . MRSA, PCR 11/08/2016 NEGATIVE  NEGATIVE Final  . Staphylococcus aureus 11/08/2016 NEGATIVE  NEGATIVE Final   Comment:        The Xpert SA Assay (FDA approved for NASAL specimens in patients over 21 years of age), is one component of a comprehensive surveillance program.  Test performance has been validated by [facility] for patients greater than or equal to 1 year old. It is not intended to diagnose infection nor to guide or monitor treatment.   . Color, Urine 11/08/2016 YELLOW  YELLOW Final  . APPearance 11/08/2016 CLOUDY* CLEAR Final  . Specific Gravity, Urine 11/08/2016 1.025  1.005 - 1.030 Final  . pH  11/08/2016 5.0  5.0 - 8.0 Final  . Glucose, UA 11/08/2016 NEGATIVE  NEGATIVE mg/dL Final  . Hgb urine dipstick 11/08/2016 NEGATIVE  NEGATIVE Final  . Bilirubin Urine 11/08/2016 NEGATIVE  NEGATIVE Final  . Ketones, ur 11/08/2016 5* NEGATIVE mg/dL Final  . Protein, ur 11/08/2016 NEGATIVE  NEGATIVE mg/dL Final  . Nitrite 11/08/2016 NEGATIVE  NEGATIVE Final  . Leukocytes, UA 11/08/2016 NEGATIVE  NEGATIVE Final  . ABO/RH(D) 11/08/2016 A POS   Final    Lab on 10/18/2016  Component Date Value Ref Range Status  . Cholesterol, Total 10/18/2016 229* 100 - 199 mg/dL Final  . Triglycerides 10/18/2016 398* 0 - 149 mg/dL Final  . HDL 10/18/2016 49  >39 mg/dL Final  . VLDL Cholesterol Cal 10/18/2016 80* 5 - 40 mg/dL Final  . LDL Calculated 10/18/2016 100* 0 - 99 mg/dL Final  . Chol/HDL Ratio 10/18/2016 4.7* 0.0 - 4.4 ratio units Final   Comment:                                   T. Chol/HDL Ratio                                             Men  Women                               1/2 Avg.Risk  3.4    3.3                                   Avg.Risk  5.0    4.4                                2X Avg.Risk  9.6    7.1                                3X Avg.Risk 23.4   11.0   . Total Protein 10/18/2016 6.0  6.0 - 8.5 g/dL Final  . Albumin 10/18/2016 4.0  3.5 - 4.8 g/dL Final  . Bilirubin Total 10/18/2016 0.2  0.0 - 1.2 mg/dL Final  . Bilirubin, Direct 10/18/2016 0.09  0.00 - 0.40 mg/dL Final  . Alkaline Phosphatase 10/18/2016 47  39 - 117 IU/L Final  . AST 10/18/2016 14  0 - 40 IU/L Final  . ALT 10/18/2016 9  0 - 32 IU/L Final     X-Rays:Dg Chest 2 View  Result Date: 11/08/2016 CLINICAL DATA:  Preop total knee replacement EXAM: CHEST  2 VIEW COMPARISON:  09/17/2012 FINDINGS: Heart is normal size. No confluent opacities or effusions. No acute bony abnormality. IMPRESSION: No active cardiopulmonary disease. Electronically Signed   By: Rolm Baptise M.D.   On: 11/08/2016 18:12    EKG: Orders placed or  performed during the hospital encounter of 11/08/16  . EKG  . EKG     Hospital Course: PA TENNANT is a 81 y.o. who was admitted to [facility]. They were brought to the operating room on 11/16/2016 and underwent Procedure(s): RIGHT TOTAL KNEE ARTHROPLASTY.  Patient tolerated the procedure well and was later transferred to the recovery room and then to the orthopaedic floor for postoperative care.  They were given PO and IV analgesics for pain control following their surgery.  They were given 24 hours of postoperative antibiotics of  Anti-infectives    Start     Dose/Rate Route Frequency Ordered Stop   11/16/16 1800  ceFAZolin (ANCEF) IVPB 1 g/50 mL premix     1 g 100 mL/hr over 30  Minutes Intravenous Every 6 hours 11/16/16 1434 11/17/16 0025   11/16/16 1146  polymyxin B 500,000 Units, bacitracin 50,000 Units in sodium chloride irrigation 0.9 % 500 mL irrigation  Status:  Discontinued       As needed 11/16/16 1147 11/16/16 1317   11/16/16 0857  ceFAZolin (ANCEF) IVPB 2g/100 mL premix     2 g 200 mL/hr over 30 Minutes Intravenous On call to O.R. 11/16/16 0857 11/16/16 1140     and started on DVT prophylaxis in the form of Xarelto, TED hose and SCDs.   PT and OT were ordered for total joint protocol.  Discharge planning consulted to help with postop disposition and equipment needs.  Patient had a good night on the evening of surgery.  They started to get up OOB with therapy on day one.  Continued to work with therapy into day two.  Dressing was changed on day two and the incision was healing well. Her D/C was held on POD3 for hypokalemia which was corrected by day 4. By day four, the patient had progressed with therapy and meeting their goals.  Incision was healing well.  Patient was seen in rounds and was ready to go home.   Diet: Regular diet Activity:WBAT Follow-up:in 10-14 days Disposition - Home Discharged Condition: good   Discharge Instructions    Call MD / Call 911     Complete by:  As directed    If you experience chest pain or shortness of breath, CALL 911 and be transported to the hospital emergency room.  If you develope a fever above 101 F, pus (white drainage) or increased drainage or redness at the wound, or calf pain, call your surgeon's office.   Call MD / Call 911    Complete by:  As directed    If you experience chest pain or shortness of breath, CALL 911 and be transported to the hospital emergency room.  If you develope a fever above 101 F, pus (white drainage) or increased drainage or redness at the wound, or calf pain, call your surgeon's office.   Constipation Prevention    Complete by:  As directed    Drink plenty of fluids.  Prune juice may be helpful.  You may use a stool softener, such as Colace (over the counter) 100 mg twice a day.  Use MiraLax (over the counter) for constipation as needed.   Constipation Prevention    Complete by:  As directed    Drink plenty of fluids.  Prune juice may be helpful.  You may use a stool softener, such as Colace (over the counter) 100 mg twice a day.  Use MiraLax (over the counter) for constipation as needed.   Diet - low sodium heart healthy    Complete by:  As directed    Diet - low sodium heart healthy    Complete by:  As directed    Diet Carb Modified    Complete by:  As directed    Discharge instructions    Complete by:  As directed    INSTRUCTIONS AFTER JOINT REPLACEMENT   Remove items at home which could result in a fall. This includes throw rugs or furniture in walking pathways ICE to the affected joint every three hours while awake for 30 minutes at a time, for at least the first 3-5 days, and then as needed for pain and swelling.  Continue to use ice for pain and swelling. You may notice swelling that will progress down to the  foot and ankle.  This is normal after surgery.  Elevate your leg when you are not up walking on it.   Continue to use the breathing machine you got in the hospital  (incentive spirometer) which will help keep your temperature down.  It is common for your temperature to cycle up and down following surgery, especially at night when you are not up moving around and exerting yourself.  The breathing machine keeps your lungs expanded and your temperature down.   DIET:  As you were doing prior to hospitalization, we recommend a well-balanced diet.  DRESSING / WOUND CARE / SHOWERING  You may change your dressing every day with sterile gauze.  Please use good hand washing techniques before changing the dressing.  Do not use any lotions or creams on the incision until instructed by your surgeon.  ACTIVITY  Increase activity slowly as tolerated, but follow the weight bearing instructions below.   No driving for 6 weeks or until further direction given by your physician.  You cannot drive while taking narcotics.  No lifting or carrying greater than 10 lbs. until further directed by your surgeon. Avoid periods of inactivity such as sitting longer than an hour when not asleep. This helps prevent blood clots.  You may return to work once you are authorized by your doctor.     WEIGHT BEARING   Weight bearing as tolerated with assist device (walker, cane, etc) as directed, use it as long as suggested by your surgeon or therapist, typically at least 4-6 weeks.   EXERCISES  Results after joint replacement surgery are often greatly improved when you follow the exercise, range of motion and muscle strengthening exercises prescribed by your doctor. Safety measures are also important to protect the joint from further injury. Any time any of these exercises cause you to have increased pain or swelling, decrease what you are doing until you are comfortable again and then slowly increase them. If you have problems or questions, call your caregiver or physical therapist for advice.   Rehabilitation is important following a joint replacement. After just a few days of  immobilization, the muscles of the leg can become weakened and shrink (atrophy).  These exercises are designed to build up the tone and strength of the thigh and leg muscles and to improve motion. Often times heat used for twenty to thirty minutes before working out will loosen up your tissues and help with improving the range of motion but do not use heat for the first two weeks following surgery (sometimes heat can increase post-operative swelling).   These exercises can be done on a training (exercise) mat, on the floor, on a table or on a bed. Use whatever works the best and is most comfortable for you.    Use music or television while you are exercising so that the exercises are a pleasant break in your day. This will make your life better with the exercises acting as a break in your routine that you can look forward to.   Perform all exercises about fifteen times, three times per day or as directed.  You should exercise both the operative leg and the other leg as well.  Exercises include:   Quad Sets - Tighten up the muscle on the front of the thigh (Quad) and hold for 5-10 seconds.   Straight Leg Raises - With your knee straight (if you were given a brace, keep it on), lift the leg to 60 degrees, hold for 3 seconds,   and slowly lower the leg.  Perform this exercise against resistance later as your leg gets stronger.  Leg Slides: Lying on your back, slowly slide your foot toward your buttocks, bending your knee up off the floor (only go as far as is comfortable). Then slowly slide your foot back down until your leg is flat on the floor again.  Angel Wings: Lying on your back spread your legs to the side as far apart as you can without causing discomfort.  Hamstring Strength:  Lying on your back, push your heel against the floor with your leg straight by tightening up the muscles of your buttocks.  Repeat, but this time bend your knee to a comfortable angle, and push your heel against the floor.  You  may put a pillow under the heel to make it more comfortable if necessary.   A rehabilitation program following joint replacement surgery can speed recovery and prevent re-injury in the future due to weakened muscles. Contact your doctor or a physical therapist for more information on knee rehabilitation.    CONSTIPATION  Constipation is defined medically as fewer than three stools per week and severe constipation as less than one stool per week.  Even if you have a regular bowel pattern at home, your normal regimen is likely to be disrupted due to multiple reasons following surgery.  Combination of anesthesia, postoperative narcotics, change in appetite and fluid intake all can affect your bowels.   YOU MUST use at least one of the following options; they are listed in order of increasing strength to get the job done.  They are all available over the counter, and you may need to use some, POSSIBLY even all of these options:    Drink plenty of fluids (prune juice may be helpful) and high fiber foods Colace 100 mg by mouth twice a day  Senokot for constipation as directed and as needed Dulcolax (bisacodyl), take with full glass of water  Miralax (polyethylene glycol) once or twice a day as needed.  If you have tried all these things and are unable to have a bowel movement in the first 3-4 days after surgery call either your surgeon or your primary doctor.    If you experience loose stools or diarrhea, hold the medications until you stool forms back up.  If your symptoms do not get better within 1 week or if they get worse, check with your doctor.  If you experience "the worst abdominal pain ever" or develop nausea or vomiting, please contact the office immediately for further recommendations for treatment.   ITCHING:  If you experience itching with your medications, try taking only a single pain pill, or even half a pain pill at a time.  You can also use Benadryl over the counter for itching or  also to help with sleep.   TED HOSE STOCKINGS:  Use stockings on both legs until for at least 2 weeks or as directed by physician office. They may be removed at night for sleeping.  MEDICATIONS:  See your medication summary on the "After Visit Summary" that nursing will review with you.  You may have some home medications which will be placed on hold until you complete the course of blood thinner medication.  It is important for you to complete the blood thinner medication as prescribed.  PRECAUTIONS:  If you experience chest pain or shortness of breath - call 911 immediately for transfer to the hospital emergency department.   If you develop a fever   greater that 101 F, purulent drainage from wound, increased redness or drainage from wound, foul odor from the wound/dressing, or calf pain - CONTACT YOUR SURGEON.                                                   FOLLOW-UP APPOINTMENTS:  If you do not already have a post-op appointment, please call the office for an appointment to be seen by your surgeon.  Guidelines for how soon to be seen are listed in your "After Visit Summary", but are typically between 1-4 weeks after surgery.   MAKE SURE YOU:  Understand these instructions.  Get help right away if you are not doing well or get worse.    Thank you for letting us be a part of your medical care team.  It is a privilege we respect greatly.  We hope these instructions will help you stay on track for a fast and full recovery!   Increase activity slowly as tolerated    Complete by:  As directed    Increase activity slowly as tolerated    Complete by:  As directed      Allergies as of 11/20/2016      Reactions   Ciprofloxacin Nausea And Vomiting   Codeine Nausea And Vomiting   Darvocet [propoxyphene N-acetaminophen] Nausea Only   Darvon Nausea Only   Hydralazine Other (See Comments)   Unsure of reaction   Hydrocodone Nausea And Vomiting   Morphine And Related Nausea And Vomiting    Pentazocine Lactate Nausea And Vomiting   Potassium-containing Compounds Nausea Only   Procaine Hcl Nausea And Vomiting   Sulfa Drugs Cross Reactors Rash      Medication List    TAKE these medications   acetaminophen 650 MG CR tablet Commonly known as:  TYLENOL Take 650 mg by mouth 2 (two) times daily as needed (for arthritis pain.).   amLODipine 5 MG tablet Commonly known as:  NORVASC Take 1 tablet (5 mg total) by mouth daily. What changed:  when to take this   carvedilol 25 MG tablet Commonly known as:  COREG Take 25 mg by mouth 2 (two) times daily.   chlorthalidone 25 MG tablet Commonly known as:  HYGROTON TAKE 1/2 TABLET BY MOUTH EVERY DAY What changed:  See the new instructions.   citalopram 20 MG tablet Commonly known as:  CELEXA Take 20 mg by mouth daily.   clonazePAM 0.5 MG tablet Commonly known as:  KLONOPIN Take 0.25-0.5 mg by mouth 2 (two) times daily. 0.25 mg in the morning, 0.5 mg in the evening   clopidogrel 75 MG tablet Commonly known as:  PLAVIX TAKE 1 TABLET(75 MG) BY MOUTH DAILY   estropipate 1.5 MG tablet Commonly known as:  OGEN Take 1 tablet (1.5 mg total) by mouth daily.   HYDROmorphone 2 MG tablet Commonly known as:  DILAUDID Take 1 tablet (2 mg total) by mouth every 4 (four) hours as needed for moderate pain.   losartan 50 MG tablet Commonly known as:  COZAAR Take 1 tablet (50 mg total) by mouth at bedtime.   metFORMIN 500 MG tablet Commonly known as:  GLUCOPHAGE Take 500 mg by mouth at bedtime.   methocarbamol 500 MG tablet Commonly known as:  ROBAXIN Take 1 tablet (500 mg total) by mouth every 6 (six) hours as needed for   muscle spasms.   metoCLOPramide 10 MG tablet Commonly known as:  REGLAN Take 1 tablet (10 mg total) by mouth every 8 (eight) hours as needed for nausea.   multivitamin 3-35-2 MG Tabs tablet Take 1 tablet by mouth daily.   tetrahydrozoline 0.05 % ophthalmic solution Place 1-2 drops into both eyes 2 (two)  times daily as needed (for dry eyes.).   traZODone 50 MG tablet Commonly known as:  DESYREL Take 50 mg by mouth at bedtime. HALF A PILL AT BEDTIME   VITAMIN B-12 PO Take 1 tablet by mouth daily.   VITAMIN D3 PO Take 1 tablet by mouth daily.      Follow-up Information    GIOFFRE,RONALD A, MD. Schedule an appointment as soon as possible for a visit in 2 week(s).   Specialty:  Orthopedic Surgery Contact information: [address] [place] [address] [phone]        [facility] Follow up.   Specialty:  Home Health Services Why:  Home Health Physical Therapy Contact information: [address] [place] [address] [phone]           Signed: Jaclyn Bissell, PA-C Orthopaedic Surgery 11/20/2016, 9:22 AM    

## 2016-11-20 NOTE — Progress Notes (Signed)
Subjective: 4 Days Post-Op Procedure(s) (LRB): RIGHT TOTAL KNEE ARTHROPLASTY (Right) Patient reports pain as moderate.  Reports pain at the R knee. No numbness, tingling, N/V. Reports urinary incontinence- upon further questioning, no numbness in groin area, she is able to feel the urge to void and when she does void. She is unable to move to get to the commode or bathroom in time so is currently using the bedpan.  Per nursing she does better during the day but has more pain at night and last night did seem confused about where she was. She would like to go home today- D/C held yesterday due to hypokalemia. She has her husband and 2 children to help her at home.  Objective: Vital signs in last 24 hours: Temp:  [97.9 F (36.6 C)-98.4 F (36.9 C)] 98.4 F (36.9 C) (02/18 0539) Pulse Rate:  [68-84] 80 (02/18 0539) Resp:  [18] 18 (02/17 1320) BP: (107-134)/(51-65) 121/65 (02/18 0539) SpO2:  [96 %] 96 % (02/17 1320)  Intake/Output from previous day: 02/17 0701 - 02/18 0700 In: 1200 [P.O.:1200] Out: 500 [Urine:500] Intake/Output this shift: Total I/O In: -  Out: 200 [Urine:200]   Recent Labs  11/18/16 0427 11/19/16 0446 11/20/16 0436  HGB 11.6* 11.0* 10.7*    Recent Labs  11/19/16 0446 11/20/16 0436  WBC 15.9* 11.3*  RBC 3.38* 3.27*  HCT 29.8* 29.3*  PLT 144* 169    Recent Labs  11/19/16 0445 11/20/16 0436  NA 126* 129*  K 2.6* 3.7  CL 87* 91*  CO2 29 28  BUN 25* 21*  CREATININE 0.91 0.87  GLUCOSE 235* 238*  CALCIUM 8.0* 8.4*   No results for input(s): LABPT, INR in the last 72 hours.  Neurologically intact ABD soft Neurovascular intact Sensation intact distally Intact pulses distally Dorsiflexion/Plantar flexion intact Incision: dressing C/D/I and no drainage No cellulitis present Compartment soft no sign of DVT  Assessment/Plan: 4 Days Post-Op Procedure(s) (LRB): RIGHT TOTAL KNEE ARTHROPLASTY (Right) Advance diet Up with therapy D/C IV fluids   Potassium improved at 3.7 this AM D/C home with HHPT today Avoid overusing narcotics given her confusion last night Discussed D/C instructions  BISSELL, JACLYN M. 11/20/2016, 9:09 AM

## 2016-11-20 NOTE — Progress Notes (Signed)
Discharge instructions reviewed with patient and husband utilizing teach back method no questions at this time. Provided list of private duty care agencies per patient request. Patient discharged home with husband via private vehicle

## 2016-11-20 NOTE — Progress Notes (Deleted)
Physical Therapy Treatment Patient Details Name: Doris Carr MRN: [id number] DOB: 1935-10-13 Today's Date: 11/20/2016    History of Present Illness s/p R TKA.  PMH:  DM with neuropathy, PVD, balance problems    PT Comments    Spouse present to provide hands on instruction and assistance for ambulation and 1 step. Patient requires multimodal cues for safety and gait sequence. Recommend very close  Assist as patient has poor balance.  Follow Up Recommendations  Home health PT;Supervision/Assistance - 24 hour     Equipment Recommendations  None recommended by PT    Recommendations for Other Services       Precautions / Restrictions Precautions Precautions: Knee Precaution Comments: spouse able to don KI    Mobility  Bed Mobility Overal bed mobility: Needs Assistance Bed Mobility: Supine to Sit     Supine to sit: Min assist     General bed mobility comments: assist with trunk and right leg  Transfers Overall transfer level: Needs assistance Equipment used: Rolling walker (2 wheeled) Transfers: Sit to/from Stand Sit to Stand: Mod assist         General transfer comment: posterior lean with x2 near falls and once lateral to the R with LB dressing, wants to sit back down too quickly.   Ambulation/Gait Ambulation/Gait assistance: Mod assist Ambulation Distance (Feet): 20 Feet Assistive device: Rolling walker (2 wheeled) Gait Pattern/deviations: Step-to pattern Gait velocity: decreased    General Gait Details: has spouse "hands on" assist pt with instruction on safe handling and proper walker distance.  Pt very unsteady demonstrating impaired safety cognition  requiring 100% VC's for direction.  Poor self corrective reaction.  HIGH FALL RISK.    Stairs Stairs: Yes   Stair Management: No rails;Forwards Number of Stairs: 1 General stair comments: spouse present to provide instruction and guarding   Wheelchair Mobility    Modified Rankin (Stroke Patients  Only)       Balance                                    Cognition Arousal/Alertness: Awake/alert Behavior During Therapy: Flat affect Overall Cognitive Status: Impaired/Different from baseline Area of Impairment: Following commands       Following Commands: Follows one step commands inconsistently;Follows one step commands with increased time       General Comments: patient's spouse provided multimodal cues during therapy for safe ambulation and 1 step. patient requires redirection.    Exercises Total Joint Exercises Ankle Circles/Pumps: AROM;Both;10 reps Short Arc Quad: AROM Heel Slides: AAROM;Right;10 reps Hip ABduction/ADduction: AAROM;Right;10 reps Straight Leg Raises: AAROM;Right;15 reps Goniometric ROM: 10-45 right knee flexion    General Comments        Pertinent Vitals/Pain Pain Assessment: No/denies pain Faces Pain Scale: Hurts a little bit Pain Location: R knee Pain Descriptors / Indicators: Aching;Operative site guarding;Grimacing Pain Intervention(s): Monitored during session;Premedicated before session    Home Living                      Prior Function            PT Goals (current goals can now be found in the care plan section) Acute Rehab PT Goals Patient Stated Goal: to get to bathroom Progress towards PT goals: Progressing toward goals    Frequency    7X/week      PT Plan Current plan remains appropriate  Co-evaluation PT/OT/SLP Co-Evaluation/Treatment: Yes           End of Session Equipment Utilized During Treatment: Gait belt Activity Tolerance: Patient limited by fatigue Patient left: in chair;with call bell/phone within reach;with family/visitor present     Time: [id number] PT Time Calculation (min) (ACUTE ONLY): 38 min  Charges:  $Gait Training: 8-22 mins $Therapeutic Exercise: 8-22 mins $Self Care/Home Management: Jun 01, 2023                    G Codes:      [name] 11/20/2016,  1:56 PM

## 2016-11-20 NOTE — Progress Notes (Signed)
Occupational Therapy Treatment Patient Details Name: Doris Carr MRN: [id number] DOB: [birth year] Today's Date: 11/20/2016    History of present illness s/p R TKA.  PMH:  DM with neuropathy, PVD, balance problems   OT comments  Pt is very high FALL RISK FOR HOME and will likely have a fall in the near future due to cognitive deficits noted in session. Spouse educated on home safety setup and positioning with transfers. Spouse reports confusion is new and worse. Pt with x3 near falls during session requiring MOD (A) from therapist with spouse also helping patient to prevent fall. Pt falling to the R with spouse during don of LB clothing after toileting and spouse falling back into bathroom wall in response to patients LOB. Spouse very concerned with falls and states "so what do we do now? I want to take her home she would do better there"  RN notified to contact CM to provide home aide to help with transfers and pt/spouse willing to private pay if required.    Follow Up Recommendations  Home health OT;Supervision/Assistance - 24 hour;Other (comment) (personal aide)    Equipment Recommendations  None recommended by OT    Recommendations for Other Services      Precautions / Restrictions Precautions Precautions: Knee Precaution Comments: spouse able to don KI       Mobility Bed Mobility Overal bed mobility: Needs Assistance Bed Mobility: Supine to Sit     Supine to sit: Mod assist     General bed mobility comments: from spouse   Transfers Overall transfer level: Needs assistance Equipment used: Rolling walker (2 wheeled) Transfers: Sit to/from Stand Sit to Stand: Mod assist         General transfer comment: posterior lean with x2 near falls and once lateral to the R with LB dressing    Balance                                   ADL Overall ADL's : Needs assistance/impaired                                       General ADL Comments:  pt and spouse completed toilet transfer LB dressing and bed mobility. Pt demonstrates confusion and self admited having trouble following spouse commands. Spouse educated on safety within home. Pt and spouse open to private paid home health aide. Pt required x3 (A) mod (A) during session to decr fall risk       Vision                     Perception     Praxis      Cognition   Behavior During Therapy: Flat affect Overall Cognitive Status: Impaired/Different from baseline Area of Impairment: Following commands        Following Commands: Follows one step commands inconsistently;Follows one step commands with increased time            Extremity/Trunk Assessment               Exercises     Shoulder Instructions       General Comments      Pertinent Vitals/ Pain       Pain Assessment: No/denies pain  Home Living  Prior Functioning/Environment              Frequency  Min 2X/week        Progress Toward Goals  OT Goals(current goals can now be found in the care plan section)  Progress towards OT goals: Progressing toward goals  Acute Rehab OT Goals Patient Stated Goal: to get to bathroom OT Goal Formulation: With patient Time For Goal Achievement: 11/25/16 Potential to Achieve Goals: Good ADL Goals Pt Will Perform Grooming: with min guard assist;standing Pt Will Transfer to Toilet: with min assist;ambulating;stand pivot transfer;bedside commode Pt Will Perform Toileting - Clothing Manipulation and hygiene: with mod assist;sit to/from stand Pt Will Perform Tub/Shower Transfer: Shower transfer;with min assist;ambulating;shower seat Additional ADL Goal #1: pt will go from sit to stand with min A and maintain with min guard for adls for 2 minutes  Plan Discharge plan remains appropriate    Co-evaluation                 End of Session Equipment Utilized During Treatment:  Rolling walker;Gait belt   Activity Tolerance Patient tolerated treatment well   Patient Left in bed;with call bell/phone within reach;with family/visitor present   Nurse Communication Mobility status;Precautions        Time: [id number] OT Time Calculation (min): 34 min  Charges: OT General Charges $OT Visit: 1 Procedure OT Treatments $Self Care/Home Management : 23-37 mins  Parke Poisson B 11/20/2016, 10:31 AM   Jeri Modena   OTR/L PagerIP:[phone] Office: [phone] .

## 2016-11-20 NOTE — Plan of Care (Signed)
Problem: Activity: Goal: Ability to avoid complications of mobility impairment will improve Outcome: Not Progressing Patient  Up with 2 person assist. Patient remains very unsteady with walker. Patient has difficulties following instructions with knee precaution. High fall risk. Recommend  Close monitoring due to frequency in urination and getting out of bed.

## 2016-11-21 DIAGNOSIS — Z471 Aftercare following joint replacement surgery: Secondary | ICD-10-CM | POA: Diagnosis not present

## 2016-11-21 DIAGNOSIS — E1122 Type 2 diabetes mellitus with diabetic chronic kidney disease: Secondary | ICD-10-CM | POA: Diagnosis not present

## 2016-11-21 DIAGNOSIS — I5032 Chronic diastolic (congestive) heart failure: Secondary | ICD-10-CM | POA: Diagnosis not present

## 2016-11-21 DIAGNOSIS — E1151 Type 2 diabetes mellitus with diabetic peripheral angiopathy without gangrene: Secondary | ICD-10-CM | POA: Diagnosis not present

## 2016-11-21 DIAGNOSIS — I130 Hypertensive heart and chronic kidney disease with heart failure and stage 1 through stage 4 chronic kidney disease, or unspecified chronic kidney disease: Secondary | ICD-10-CM | POA: Diagnosis not present

## 2016-11-21 DIAGNOSIS — N183 Chronic kidney disease, stage 3 (moderate): Secondary | ICD-10-CM | POA: Diagnosis not present

## 2016-11-22 NOTE — Progress Notes (Addendum)
   11/20/16 1400- late entry  PT Visit Information  Last PT Received On 11/20/16  Assistance Needed +1  PT/OT/SLP Co-Evaluation/Treatment Yes  History of Present Illness s/p R TKA.  PMH:  DM with neuropathy, PVD, balance problems  Precautions  Precautions Knee  Precaution Comments spouse able to don KI  Pain Assessment  Faces Pain Scale 2  Pain Location R knee  Pain Descriptors / Indicators Aching;Operative site guarding;Grimacing  Bed Mobility  Bed Mobility Supine to Sit  Supine to sit Min assist  General bed mobility comments assist with trunk and right leg  Transfers  Equipment used Rolling walker (2 wheeled)  Transfers Sit to/from Stand  Sit to Stand Mod assist  General transfer comment posterior lean with x2 near falls and once lateral to the R with LB dressing, wants to sit back down too quickly.   Ambulation/Gait  Ambulation/Gait assistance Mod assist  Assistive device Rolling walker (2 wheeled)  Gait Pattern/deviations Step-to pattern  General Gait Details has spouse "hands on" assist pt with instruction on safe handling and proper walker distance.  Pt very unsteady demonstrating impaired safety cognition  requiring 100% VC's for direction.  Poor self corrective reaction.  HIGH FALL RISK.   Gait velocity decreased   Stairs assistance Mod assist  Stair Management No rails;Forwards  General stair comments spouse present to provide instruction and guarding   Total Joint Exercises  Ankle Circles/Pumps AROM;Both;10 reps  Short Arc Quad AROM  Heel Slides AAROM;Right;10 reps  Hip ABduction/ADduction AAROM;Right;10 reps  Straight Leg Raises AAROM;Right;15 reps  PT - End of Session  Equipment Utilized During Treatment Gait belt  Activity Tolerance Patient limited by fatigue  Patient left in chair;with call bell/phone within reach;with family/visitor present  Nurse Communication Mobility status  PT - Assessment/Plan  PT Plan Current plan remains appropriate  PT Frequency  (ACUTE ONLY) 7X/week  Follow Up Recommendations Home health PT;Supervision/Assistance - 24 hour  PT equipment None recommended by PT

## 2016-11-22 NOTE — Progress Notes (Signed)
   11/20/16 1400  PT Visit Information  Last PT Received On 11/20/16  Assistance Needed +1  PT/OT/SLP Co-Evaluation/Treatment Yes  History of Present Illness s/p R TKA.  PMH:  DM with neuropathy, PVD, balance problems  Precautions  Precautions Knee  Precaution Comments spouse able to don KI  Pain Assessment  Faces Pain Scale 2  Pain Location R knee  Pain Descriptors / Indicators Aching;Operative site guarding;Grimacing  Bed Mobility  Bed Mobility Supine to Sit  Supine to sit Min assist  General bed mobility comments assist with trunk and right leg  Transfers  Equipment used Rolling walker (2 wheeled)  Transfers Sit to/from Stand  Sit to Stand Mod assist  General transfer comment posterior lean with x2 near falls and once lateral to the R with LB dressing, wants to sit back down too quickly.   Ambulation/Gait  Ambulation/Gait assistance Mod assist  Assistive device Rolling walker (2 wheeled)  Gait Pattern/deviations Step-to pattern  General Gait Details has spouse "hands on" assist pt with instruction on safe handling and proper walker distance.  Pt very unsteady demonstrating impaired safety cognition  requiring 100% VC's for direction.  Poor self corrective reaction.  HIGH FALL RISK.   Gait velocity decreased   Stairs assistance Mod assist  Stair Management No rails;Forwards  General stair comments spouse present to provide instruction and guarding   Total Joint Exercises  Ankle Circles/Pumps AROM;Both;10 reps  Short Arc Quad AROM  Heel Slides AAROM;Right;10 reps  Hip ABduction/ADduction AAROM;Right;10 reps  Straight Leg Raises AAROM;Right;15 reps  PT - End of Session  Equipment Utilized During Treatment Gait belt  Activity Tolerance Patient limited by fatigue  Patient left in chair;with call bell/phone within reach;with family/visitor present  Nurse Communication Mobility status  PT - Assessment/Plan  PT Plan Current plan remains appropriate  PT Frequency (ACUTE ONLY)  7X/week  Follow Up Recommendations Home health PT;Supervision/Assistance - 24 hour  PT equipment None recommended by PT  [name] PT [phone]

## 2016-11-22 NOTE — Progress Notes (Deleted)
   11/20/16 1400 Late entry for 11/20/16  PT Visit Information  Assistance Needed +1  PT/OT/SLP Co-Evaluation/Treatment Yes  History of Present Illness s/p R TKA.  PMH:  DM with neuropathy, PVD, balance problems  Precautions  Precautions Knee  Precaution Comments spouse able to don KI  Pain Assessment  Faces Pain Scale 2  Pain Location R knee  Pain Descriptors / Indicators Aching;Operative site guarding;Grimacing  Bed Mobility  Bed Mobility Supine to Sit  Supine to sit Min assist  General bed mobility comments assist with trunk and right leg  Transfers  Equipment used Rolling walker (2 wheeled)  Transfers Sit to/from Stand  Sit to Stand Mod assist  General transfer comment posterior lean with x2 near falls and once lateral to the R with LB dressing, wants to sit back down too quickly.   Ambulation/Gait  Ambulation/Gait assistance Mod assist  Assistive device Rolling walker (2 wheeled)  Gait Pattern/deviations Step-to pattern  General Gait Details has spouse "hands on" assist pt with instruction on safe handling and proper walker distance.  Pt very unsteady demonstrating impaired safety cognition  requiring 100% VC's for direction.  Poor self corrective reaction.  HIGH FALL RISK.   Gait velocity decreased   Stairs assistance Mod assist  Stair Management No rails;Forwards  General stair comments spouse present to provide instruction and guarding   Total Joint Exercises  Ankle Circles/Pumps AROM;Both;10 reps  Short Arc Quad AROM  Heel Slides AAROM;Right;10 reps  Hip ABduction/ADduction AAROM;Right;10 reps  Straight Leg Raises AAROM;Right;15 reps  PT - End of Session  Equipment Utilized During Treatment Gait belt  Activity Tolerance Patient limited by fatigue  Patient left in chair;with call bell/phone within reach;with family/visitor present  Nurse Communication Mobility status  PT - Assessment/Plan  PT Plan Current plan remains appropriate  PT Frequency (ACUTE ONLY) 7X/week   Follow Up Recommendations Home health PT;Supervision/Assistance - 24 hour  PT equipment None recommended by PT  [name] PT [phone]

## 2016-11-23 DIAGNOSIS — I5032 Chronic diastolic (congestive) heart failure: Secondary | ICD-10-CM | POA: Diagnosis not present

## 2016-11-23 DIAGNOSIS — E1122 Type 2 diabetes mellitus with diabetic chronic kidney disease: Secondary | ICD-10-CM | POA: Diagnosis not present

## 2016-11-23 DIAGNOSIS — N183 Chronic kidney disease, stage 3 (moderate): Secondary | ICD-10-CM | POA: Diagnosis not present

## 2016-11-23 DIAGNOSIS — I130 Hypertensive heart and chronic kidney disease with heart failure and stage 1 through stage 4 chronic kidney disease, or unspecified chronic kidney disease: Secondary | ICD-10-CM | POA: Diagnosis not present

## 2016-11-23 DIAGNOSIS — Z471 Aftercare following joint replacement surgery: Secondary | ICD-10-CM | POA: Diagnosis not present

## 2016-11-23 DIAGNOSIS — E1151 Type 2 diabetes mellitus with diabetic peripheral angiopathy without gangrene: Secondary | ICD-10-CM | POA: Diagnosis not present

## 2016-11-24 DIAGNOSIS — I5032 Chronic diastolic (congestive) heart failure: Secondary | ICD-10-CM | POA: Diagnosis not present

## 2016-11-24 DIAGNOSIS — N183 Chronic kidney disease, stage 3 (moderate): Secondary | ICD-10-CM | POA: Diagnosis not present

## 2016-11-24 DIAGNOSIS — I130 Hypertensive heart and chronic kidney disease with heart failure and stage 1 through stage 4 chronic kidney disease, or unspecified chronic kidney disease: Secondary | ICD-10-CM | POA: Diagnosis not present

## 2016-11-24 DIAGNOSIS — E1122 Type 2 diabetes mellitus with diabetic chronic kidney disease: Secondary | ICD-10-CM | POA: Diagnosis not present

## 2016-11-24 DIAGNOSIS — Z471 Aftercare following joint replacement surgery: Secondary | ICD-10-CM | POA: Diagnosis not present

## 2016-11-24 DIAGNOSIS — E1151 Type 2 diabetes mellitus with diabetic peripheral angiopathy without gangrene: Secondary | ICD-10-CM | POA: Diagnosis not present

## 2016-11-28 DIAGNOSIS — N183 Chronic kidney disease, stage 3 (moderate): Secondary | ICD-10-CM | POA: Diagnosis not present

## 2016-11-28 DIAGNOSIS — I5032 Chronic diastolic (congestive) heart failure: Secondary | ICD-10-CM | POA: Diagnosis not present

## 2016-11-28 DIAGNOSIS — E1151 Type 2 diabetes mellitus with diabetic peripheral angiopathy without gangrene: Secondary | ICD-10-CM | POA: Diagnosis not present

## 2016-11-28 DIAGNOSIS — E1122 Type 2 diabetes mellitus with diabetic chronic kidney disease: Secondary | ICD-10-CM | POA: Diagnosis not present

## 2016-11-28 DIAGNOSIS — Z471 Aftercare following joint replacement surgery: Secondary | ICD-10-CM | POA: Diagnosis not present

## 2016-11-28 DIAGNOSIS — I130 Hypertensive heart and chronic kidney disease with heart failure and stage 1 through stage 4 chronic kidney disease, or unspecified chronic kidney disease: Secondary | ICD-10-CM | POA: Diagnosis not present

## 2016-11-30 DIAGNOSIS — E1122 Type 2 diabetes mellitus with diabetic chronic kidney disease: Secondary | ICD-10-CM | POA: Diagnosis not present

## 2016-11-30 DIAGNOSIS — Z471 Aftercare following joint replacement surgery: Secondary | ICD-10-CM | POA: Diagnosis not present

## 2016-11-30 DIAGNOSIS — N183 Chronic kidney disease, stage 3 (moderate): Secondary | ICD-10-CM | POA: Diagnosis not present

## 2016-11-30 DIAGNOSIS — E1151 Type 2 diabetes mellitus with diabetic peripheral angiopathy without gangrene: Secondary | ICD-10-CM | POA: Diagnosis not present

## 2016-11-30 DIAGNOSIS — I5032 Chronic diastolic (congestive) heart failure: Secondary | ICD-10-CM | POA: Diagnosis not present

## 2016-11-30 DIAGNOSIS — I130 Hypertensive heart and chronic kidney disease with heart failure and stage 1 through stage 4 chronic kidney disease, or unspecified chronic kidney disease: Secondary | ICD-10-CM | POA: Diagnosis not present

## 2016-12-01 DIAGNOSIS — Z471 Aftercare following joint replacement surgery: Secondary | ICD-10-CM | POA: Diagnosis not present

## 2016-12-01 DIAGNOSIS — M1711 Unilateral primary osteoarthritis, right knee: Secondary | ICD-10-CM | POA: Diagnosis not present

## 2016-12-01 DIAGNOSIS — Z96651 Presence of right artificial knee joint: Secondary | ICD-10-CM | POA: Diagnosis not present

## 2016-12-05 DIAGNOSIS — Z96651 Presence of right artificial knee joint: Secondary | ICD-10-CM | POA: Diagnosis not present

## 2016-12-05 DIAGNOSIS — Z471 Aftercare following joint replacement surgery: Secondary | ICD-10-CM | POA: Diagnosis not present

## 2016-12-15 DIAGNOSIS — Z471 Aftercare following joint replacement surgery: Secondary | ICD-10-CM | POA: Diagnosis not present

## 2016-12-15 DIAGNOSIS — Z96651 Presence of right artificial knee joint: Secondary | ICD-10-CM | POA: Diagnosis not present

## 2016-12-15 DIAGNOSIS — M1711 Unilateral primary osteoarthritis, right knee: Secondary | ICD-10-CM | POA: Diagnosis not present

## 2016-12-17 DIAGNOSIS — Z471 Aftercare following joint replacement surgery: Secondary | ICD-10-CM | POA: Diagnosis not present

## 2016-12-17 DIAGNOSIS — M1711 Unilateral primary osteoarthritis, right knee: Secondary | ICD-10-CM | POA: Diagnosis not present

## 2016-12-17 DIAGNOSIS — Z96651 Presence of right artificial knee joint: Secondary | ICD-10-CM | POA: Diagnosis not present

## 2016-12-20 DIAGNOSIS — Z7409 Other reduced mobility: Secondary | ICD-10-CM | POA: Diagnosis not present

## 2016-12-20 DIAGNOSIS — Z96651 Presence of right artificial knee joint: Secondary | ICD-10-CM | POA: Diagnosis not present

## 2016-12-20 DIAGNOSIS — Z471 Aftercare following joint replacement surgery: Secondary | ICD-10-CM | POA: Diagnosis not present

## 2016-12-20 DIAGNOSIS — M1711 Unilateral primary osteoarthritis, right knee: Secondary | ICD-10-CM | POA: Diagnosis not present

## 2016-12-22 DIAGNOSIS — G8911 Acute pain due to trauma: Secondary | ICD-10-CM | POA: Diagnosis not present

## 2016-12-22 DIAGNOSIS — Z96651 Presence of right artificial knee joint: Secondary | ICD-10-CM | POA: Diagnosis not present

## 2016-12-22 DIAGNOSIS — M1711 Unilateral primary osteoarthritis, right knee: Secondary | ICD-10-CM | POA: Diagnosis not present

## 2016-12-22 DIAGNOSIS — S79919A Unspecified injury of unspecified hip, initial encounter: Secondary | ICD-10-CM | POA: Diagnosis not present

## 2016-12-22 DIAGNOSIS — Z471 Aftercare following joint replacement surgery: Secondary | ICD-10-CM | POA: Diagnosis not present

## 2017-01-03 DIAGNOSIS — Z7409 Other reduced mobility: Secondary | ICD-10-CM | POA: Diagnosis not present

## 2017-01-03 DIAGNOSIS — Z471 Aftercare following joint replacement surgery: Secondary | ICD-10-CM | POA: Diagnosis not present

## 2017-01-03 DIAGNOSIS — Z96651 Presence of right artificial knee joint: Secondary | ICD-10-CM | POA: Diagnosis not present

## 2017-01-03 DIAGNOSIS — M1711 Unilateral primary osteoarthritis, right knee: Secondary | ICD-10-CM | POA: Diagnosis not present

## 2017-01-05 DIAGNOSIS — Z7409 Other reduced mobility: Secondary | ICD-10-CM | POA: Diagnosis not present

## 2017-01-10 DIAGNOSIS — Z7409 Other reduced mobility: Secondary | ICD-10-CM | POA: Diagnosis not present

## 2017-01-12 DIAGNOSIS — Z7409 Other reduced mobility: Secondary | ICD-10-CM | POA: Diagnosis not present

## 2017-01-13 ENCOUNTER — Other Ambulatory Visit: Payer: Self-pay | Admitting: Cardiovascular Disease

## 2017-01-16 ENCOUNTER — Ambulatory Visit: Payer: Medicare Other | Admitting: Cardiovascular Disease

## 2017-01-18 DIAGNOSIS — Z7409 Other reduced mobility: Secondary | ICD-10-CM | POA: Diagnosis not present

## 2017-01-20 DIAGNOSIS — Z7409 Other reduced mobility: Secondary | ICD-10-CM | POA: Diagnosis not present

## 2017-01-24 DIAGNOSIS — Z96651 Presence of right artificial knee joint: Secondary | ICD-10-CM | POA: Diagnosis not present

## 2017-01-24 DIAGNOSIS — M1711 Unilateral primary osteoarthritis, right knee: Secondary | ICD-10-CM | POA: Diagnosis not present

## 2017-01-24 DIAGNOSIS — Z7409 Other reduced mobility: Secondary | ICD-10-CM | POA: Diagnosis not present

## 2017-01-24 DIAGNOSIS — Z471 Aftercare following joint replacement surgery: Secondary | ICD-10-CM | POA: Diagnosis not present

## 2017-01-27 DIAGNOSIS — Z7409 Other reduced mobility: Secondary | ICD-10-CM | POA: Diagnosis not present

## 2017-01-31 DIAGNOSIS — Z7409 Other reduced mobility: Secondary | ICD-10-CM | POA: Diagnosis not present

## 2017-02-03 DIAGNOSIS — Z7409 Other reduced mobility: Secondary | ICD-10-CM | POA: Diagnosis not present

## 2017-02-08 DIAGNOSIS — Z7409 Other reduced mobility: Secondary | ICD-10-CM | POA: Diagnosis not present

## 2017-02-10 DIAGNOSIS — Z7409 Other reduced mobility: Secondary | ICD-10-CM | POA: Diagnosis not present

## 2017-02-14 DIAGNOSIS — Z471 Aftercare following joint replacement surgery: Secondary | ICD-10-CM | POA: Diagnosis not present

## 2017-02-14 DIAGNOSIS — Z96651 Presence of right artificial knee joint: Secondary | ICD-10-CM | POA: Diagnosis not present

## 2017-02-14 DIAGNOSIS — M1711 Unilateral primary osteoarthritis, right knee: Secondary | ICD-10-CM | POA: Diagnosis not present

## 2017-02-15 DIAGNOSIS — Z7409 Other reduced mobility: Secondary | ICD-10-CM | POA: Diagnosis not present

## 2017-02-17 DIAGNOSIS — Z7409 Other reduced mobility: Secondary | ICD-10-CM | POA: Diagnosis not present

## 2017-02-21 DIAGNOSIS — Z7409 Other reduced mobility: Secondary | ICD-10-CM | POA: Diagnosis not present

## 2017-02-23 DIAGNOSIS — M1711 Unilateral primary osteoarthritis, right knee: Secondary | ICD-10-CM | POA: Diagnosis not present

## 2017-02-23 DIAGNOSIS — Z7409 Other reduced mobility: Secondary | ICD-10-CM | POA: Diagnosis not present

## 2017-03-02 DIAGNOSIS — Z7409 Other reduced mobility: Secondary | ICD-10-CM | POA: Diagnosis not present

## 2017-03-02 DIAGNOSIS — M1711 Unilateral primary osteoarthritis, right knee: Secondary | ICD-10-CM | POA: Diagnosis not present

## 2017-03-07 DIAGNOSIS — Z7409 Other reduced mobility: Secondary | ICD-10-CM | POA: Diagnosis not present

## 2017-03-10 DIAGNOSIS — Z7409 Other reduced mobility: Secondary | ICD-10-CM | POA: Diagnosis not present

## 2017-03-13 ENCOUNTER — Encounter: Payer: Self-pay | Admitting: Cardiovascular Disease

## 2017-03-13 ENCOUNTER — Ambulatory Visit (INDEPENDENT_AMBULATORY_CARE_PROVIDER_SITE_OTHER): Payer: Medicare Other | Admitting: Cardiovascular Disease

## 2017-03-13 VITALS — BP 124/56 | HR 78 | Ht 64.0 in | Wt 145.4 lb

## 2017-03-13 DIAGNOSIS — I739 Peripheral vascular disease, unspecified: Secondary | ICD-10-CM

## 2017-03-13 DIAGNOSIS — I447 Left bundle-branch block, unspecified: Secondary | ICD-10-CM

## 2017-03-13 DIAGNOSIS — E785 Hyperlipidemia, unspecified: Secondary | ICD-10-CM

## 2017-03-13 NOTE — Progress Notes (Signed)
Cardiology Office Note Date:  03/15/2017   ID:  Doris Carr, DOB 30-Apr-1936, MRN [id number]  PCP:  Lawerance Cruel, MD  Cardiologist:  Sherren Mocha, MD    Chief Complaint  Patient presents with  . Follow-up     History of Present Illness: Doris Carr is a 81 y.o. female who presents for  follow-up evaluation. She's been followed for peripheral arterial disease and has undergone right iliac and left SFA stenting.   The patient is here with her husband today. She had a torn meniscus in her right knee after an injury that occurred during yoga. She initially had arthroscopic surgery but ultimately required total knee replacement in February. She had a mechanical fall about one month after surgery and has had a pretty slow recovery. She's having some problems with weakness and gait instability. She is doing physical therapy and trying to walk more recently. She has not had any calf claudication symptoms. She denies problems with her feet. She denies chest pain, shortness of breath, or heart palpitations.  Past Medical History:  Diagnosis Date  . Anxiety   . Arthritis    osteoarthritis  . Back pain    sees a neurosurgeon  . Balance problems   . Chronic diastolic heart failure (HCC)    Echo (8/12) with EF 60-65%, moderate LV hypertrophy, mild LVH  . 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 CKD (chronic kidney disease)    likely due to DM2 and HTN  . Current tear of meniscus right knee  . Depression   . Diabetes mellitus    type  2  . GERD (gastroesophageal reflux disease)   . History of hiatal hernia    inguinal hernia  . HLD (hyperlipidemia)    myalgias with statins  . Hypertension    This has been difficult to control. She has had renal artery doppler evaluation on two different occasions, both times with no evidence for renal artery stenosis. She has had urinary catecholeamine collection that was unremarkable. Edema with 10 mg amlodipine.   .  Left bundle branch block   . Lower extremity edema    CHRONIC  . Lung nodule    VATS and biopsy in 2006 that was negative for malignancy  . Obesity   . Peripheral vascular disease ([place])    Lower extremity evaluation (12/12): ABI 0.79 right, 0.64 left; > 50% mid left SFA stenosis;  s/p L SFA stent and stent to R CIA 11/2011; ABI's post PTA - R 0.90, L 0.96 (TBI on R 0.51, L 0.61)  . Pneumonia    multiple times in childhood  . PONV (postoperative nausea and vomiting)   . S/P cardiac catheterization    Left heart cath in 6/06 with normal coronaries, EF 60%.  . Syncope and collapse     Past Surgical History:  Procedure Laterality Date  . ABDOMINAL AORTAGRAM N/A 11/09/2011   Procedure: ABDOMINAL Maxcine Ham;  Surgeon: Sherren Mocha, MD;  Location: [facility] CATH LAB;  Service: Cardiovascular;  Laterality: N/A;  . abdominal aortogram  11/09/2011  . ANAL RECTAL MANOMETRY N/A 05/08/2013   Procedure: ANAL RECTAL MANOMETRY;  Surgeon: Leighton Ruff, MD;  Location: [facility] ENDOSCOPY;  Service: Endoscopy;  Laterality: N/A;  . CARDIAC CATHETERIZATION  2006   NORMAL CORONARIES  . CATARACT EXTRACTION    . CERVICAL CONE BIOPSY    . FLEXIBLE SIGMOIDOSCOPY N/A 05/08/2013   Procedure: FLEXIBLE SIGMOIDOSCOPY;  Surgeon: Leighton Ruff, MD;  Location: [facility] ENDOSCOPY;  Service: Endoscopy;  Laterality: N/A;  rigid proctoscope get from o.r.  . GYNECOLOGIC CRYOSURGERY    . Knee Surg     Arthroscopic  . RECTAL ULTRASOUND N/A 05/08/2013   Procedure: RECTAL ULTRASOUND;  Surgeon: Leighton Ruff, MD;  Location: [facility] ENDOSCOPY;  Service: Endoscopy;  Laterality: N/A;  . Stints in leg arteries    . THORACOTOMY    . THYMIC CYST REMOVAL    . TONSILLECTOMY    . TOTAL KNEE ARTHROPLASTY Right 11/16/2016   Procedure: RIGHT TOTAL KNEE ARTHROPLASTY;  Surgeon: Latanya Maudlin, MD;  Location: [facility] ORS;  Service: Orthopedics;  Laterality: Right;  block  . VAGINAL HYSTERECTOMY  1985   WITH A/P REPAIR    Current Outpatient Prescriptions  Medication  Sig Dispense Refill  . acetaminophen (TYLENOL) 650 MG CR tablet Take 650 mg by mouth 2 (two) times daily as needed (for arthritis pain.).    Marland Kitchen amLODipine (NORVASC) 5 MG tablet Take 5 mg by mouth 2 (two) times daily.    . carvedilol (COREG) 25 MG tablet Take 25 mg by mouth 2 (two) times daily.    . chlorthalidone (HYGROTON) 25 MG tablet Take 12.5 mg by mouth daily.    . citalopram (CELEXA) 20 MG tablet Take 20 mg by mouth daily.      . clonazePAM (KLONOPIN) 0.5 MG tablet Take 0.25-0.5 mg by mouth 2 (two) times daily. 0.25 mg in the morning, 0.5 mg in the evening    . clopidogrel (PLAVIX) 75 MG tablet Take 75 mg by mouth daily.    . Cyanocobalamin (VITAMIN B-12 PO) Take 1 tablet by mouth daily.    Marland Kitchen estropipate (OGEN) 1.5 MG tablet Take 1 tablet (1.5 mg total) by mouth daily. 30 tablet 12  . L-Methylfolate-B6-B12 (METANX) 3-35-2 MG TABS Take 1 tablet by mouth daily.     Marland Kitchen losartan (COZAAR) 50 MG tablet TAKE 1 TABLET BY MOUTH AT BEDTIME 90 tablet 0  . metFORMIN (GLUCOPHAGE) 500 MG tablet Take 500 mg by mouth at bedtime.  4  . traZODone (DESYREL) 50 MG tablet Take 50 mg by mouth at bedtime. HALF A PILL AT BEDTIME    . zolpidem (AMBIEN) 10 MG tablet Take 5 mg by mouth at bedtime.     No current facility-administered medications for this visit.     Allergies:   Ciprofloxacin; Codeine; Darvocet [propoxyphene n-acetaminophen]; Darvon; Hydralazine; Hydrocodone; Morphine and related; Pentazocine lactate; Potassium-containing compounds; Procaine hcl; and Sulfa drugs cross reactors   Social History:  The patient  reports that she quit smoking about 32 years ago. Her smoking use included Cigarettes. She has a 12.00 pack-year smoking history. She has never used smokeless tobacco. She reports that she does not drink alcohol or use drugs.   Family History:  The patient's  family history includes Alzheimer's disease in her father and mother; Diabetes in her paternal grandfather; Heart disease in her mother;  Hypertension in her mother.    ROS:  Please see the history of present illness.  Otherwise, review of systems is positive for leg swelling.  All other systems are reviewed and negative.    PHYSICAL EXAM: VS:  BP (!) 124/56   Pulse 78   Ht 5\' 4"  (1.626 m)   Wt 145 lb 6.4 oz (66 kg)   BMI 24.96 kg/m  , BMI Body mass index is 24.96 kg/m. GEN: Well nourished, well developed, pleasant elderly woman in no acute distress  HEENT: normal  Neck: no JVD, no  masses. No carotid bruits Cardiac: RRR without murmur or gallop                Respiratory:  clear to auscultation bilaterally, normal work of breathing GI: soft, nontender, nondistended, + BS MS: no deformity or atrophy  Ext: 1+ right ankle edema, trace edema on left, pedal pulses palpable but diminished bilaterally Skin: warm and dry, no rash Neuro:  Strength and sensation are intact Psych: euthymic mood, full affect  EKG:  EKG is not ordered today.  Recent Labs: 11/08/2016: ALT 11 11/20/2016: BUN 21; Creatinine, Ser 0.87; Hemoglobin 10.7; Platelets 169; Potassium 3.7; Sodium 129   Lipid Panel     Component Value Date/Time   CHOL 229 (H) 10/18/2016 0747   TRIG 398 (H) 10/18/2016 0747   HDL 49 10/18/2016 0747   CHOLHDL 4.7 (H) 10/18/2016 0747   CHOLHDL 4.7 05/06/2016 1535   VLDL 55 (H) 05/06/2016 1535   LDLCALC 100 (H) 10/18/2016 0747   LDLDIRECT 154.4 08/28/2013 1117      Wt Readings from Last 3 Encounters:  03/13/17 145 lb 6.4 oz (66 kg)  11/16/16 153 lb 6 oz (69.6 kg)  11/08/16 153 lb 6.4 oz (69.6 kg)     ASSESSMENT AND PLAN: 1.  PAD with intermittent claudication: currently no symptoms, more limited by orthopedic problems. Medical program reviewed today and will be continued.   2. HTN: BP controlled on current Rx. Most recent labs reviewed.   3. Hyperlipidemia: intolerant to statins and other lipid lowering Rx. Will continue to work on diet and exercise as tolerated.   Current medicines are reviewed with the  patient today.  The patient does not have concerns regarding medicines.  Labs/ tests ordered today include:  No orders of the defined types were placed in this encounter.   Disposition:   FU one year with arterial doppler studies  Signed, Sherren Mocha, MD  03/15/2017 11:38 PM    [facility] [address], [place], [place]  [address] Phone: [phone]; Fax: [phone]

## 2017-03-13 NOTE — Patient Instructions (Signed)
Medication Instructions:  Your physician recommends that you continue on your current medications as directed. Please refer to the Current Medication list given to you today.  Labwork: No new orders.   Testing/Procedures: Please reschedule 03/31/17 vascular studies to June 2019.  Follow-Up: Your physician wants you to follow-up in: 1 YEAR with Dr Burt Knack.  You will receive a reminder letter in the mail two months in advance. If you don't receive a letter, please call our office to schedule the follow-up appointment.   Any Other Special Instructions Will Be Listed Below (If Applicable).     If you need a refill on your cardiac medications before your next appointment, please call your pharmacy.

## 2017-03-14 DIAGNOSIS — Z96651 Presence of right artificial knee joint: Secondary | ICD-10-CM | POA: Diagnosis not present

## 2017-03-14 DIAGNOSIS — M1711 Unilateral primary osteoarthritis, right knee: Secondary | ICD-10-CM | POA: Diagnosis not present

## 2017-03-14 DIAGNOSIS — Z471 Aftercare following joint replacement surgery: Secondary | ICD-10-CM | POA: Diagnosis not present

## 2017-03-14 DIAGNOSIS — S83241D Other tear of medial meniscus, current injury, right knee, subsequent encounter: Secondary | ICD-10-CM | POA: Diagnosis not present

## 2017-03-15 DIAGNOSIS — S83241D Other tear of medial meniscus, current injury, right knee, subsequent encounter: Secondary | ICD-10-CM | POA: Diagnosis not present

## 2017-03-17 DIAGNOSIS — F419 Anxiety disorder, unspecified: Secondary | ICD-10-CM | POA: Diagnosis not present

## 2017-03-17 DIAGNOSIS — E1142 Type 2 diabetes mellitus with diabetic polyneuropathy: Secondary | ICD-10-CM | POA: Diagnosis not present

## 2017-03-20 DIAGNOSIS — F419 Anxiety disorder, unspecified: Secondary | ICD-10-CM | POA: Diagnosis not present

## 2017-03-20 DIAGNOSIS — Z7984 Long term (current) use of oral hypoglycemic drugs: Secondary | ICD-10-CM | POA: Diagnosis not present

## 2017-03-20 DIAGNOSIS — E1142 Type 2 diabetes mellitus with diabetic polyneuropathy: Secondary | ICD-10-CM | POA: Diagnosis not present

## 2017-03-21 ENCOUNTER — Other Ambulatory Visit: Payer: Self-pay | Admitting: Cardiovascular Disease

## 2017-03-22 DIAGNOSIS — Z7409 Other reduced mobility: Secondary | ICD-10-CM | POA: Diagnosis not present

## 2017-03-24 DIAGNOSIS — E1142 Type 2 diabetes mellitus with diabetic polyneuropathy: Secondary | ICD-10-CM | POA: Diagnosis not present

## 2017-03-24 DIAGNOSIS — Z7984 Long term (current) use of oral hypoglycemic drugs: Secondary | ICD-10-CM | POA: Diagnosis not present

## 2017-03-24 DIAGNOSIS — F411 Generalized anxiety disorder: Secondary | ICD-10-CM | POA: Diagnosis not present

## 2017-03-30 DIAGNOSIS — Z7409 Other reduced mobility: Secondary | ICD-10-CM | POA: Diagnosis not present

## 2017-03-31 ENCOUNTER — Inpatient Hospital Stay (HOSPITAL_COMMUNITY)
Admission: RE | Admit: 2017-03-31 | Discharge: 2017-03-31 | Disposition: A | Discharge status: Home / Self Care | Payer: Medicare Other | Source: Ambulatory Visit | Attending: Cardiovascular Disease | Admitting: Cardiovascular Disease

## 2017-03-31 DIAGNOSIS — I739 Peripheral vascular disease, unspecified: Secondary | ICD-10-CM

## 2017-04-06 DIAGNOSIS — Z7409 Other reduced mobility: Secondary | ICD-10-CM | POA: Diagnosis not present

## 2017-04-07 ENCOUNTER — Telehealth: Payer: Self-pay | Admitting: *Deleted

## 2017-04-07 NOTE — Telephone Encounter (Signed)
PA done online via cover my meds, for estropipate 1.5 mg will wait for response.

## 2017-04-10 ENCOUNTER — Other Ambulatory Visit: Payer: Self-pay | Admitting: Cardiovascular Disease

## 2017-04-10 NOTE — Telephone Encounter (Signed)
Left message for pt to call.

## 2017-04-10 NOTE — Telephone Encounter (Signed)
Okay to try estradiol 0.5 mg tablet 1 by mouth daily #30 with refill through next annual exam. Call if patient does not feel she is getting adequate response from the medication.

## 2017-04-10 NOTE — Telephone Encounter (Signed)
[employer] denied Rx for estropipate tablet 1.5 mg (not a covered drug) pt will need to try and fail 2 preferred medications on formulary. [facility] recommends elestrin, femring, climara patch or estradiol tablet, menest, premarin tablet. Are any of the above recommended for patient? Please advise

## 2017-04-13 DIAGNOSIS — Z7409 Other reduced mobility: Secondary | ICD-10-CM | POA: Diagnosis not present

## 2017-04-19 DIAGNOSIS — Z7409 Other reduced mobility: Secondary | ICD-10-CM | POA: Diagnosis not present

## 2017-04-20 MED ORDER — ESTRADIOL 0.5 MG PO TABS: 0.5000 mg | ORAL_TABLET | Freq: Every day | ORAL | 3 refills | Status: DC

## 2017-04-20 NOTE — Telephone Encounter (Signed)
Pt informed Rx sent. 

## 2017-04-27 DIAGNOSIS — Z471 Aftercare following joint replacement surgery: Secondary | ICD-10-CM | POA: Diagnosis not present

## 2017-04-27 DIAGNOSIS — Z96651 Presence of right artificial knee joint: Secondary | ICD-10-CM | POA: Diagnosis not present

## 2017-04-27 DIAGNOSIS — M1711 Unilateral primary osteoarthritis, right knee: Secondary | ICD-10-CM | POA: Diagnosis not present

## 2017-05-08 NOTE — Telephone Encounter (Signed)
Subject decided not to continue 09/19/16.

## 2017-05-23 DIAGNOSIS — N6002 Solitary cyst of left breast: Secondary | ICD-10-CM | POA: Diagnosis not present

## 2017-05-23 DIAGNOSIS — Z780 Asymptomatic menopausal state: Secondary | ICD-10-CM | POA: Diagnosis not present

## 2017-05-29 ENCOUNTER — Encounter: Payer: Self-pay | Admitting: Gynecology

## 2017-05-31 ENCOUNTER — Encounter: Payer: Self-pay | Admitting: Gynecology

## 2017-06-19 ENCOUNTER — Ambulatory Visit (INDEPENDENT_AMBULATORY_CARE_PROVIDER_SITE_OTHER): Payer: Medicare Other | Admitting: Gynecology

## 2017-06-19 ENCOUNTER — Encounter: Payer: Self-pay | Admitting: Gynecology

## 2017-06-19 VITALS — BP 118/74 | Ht 64.0 in | Wt 145.0 lb

## 2017-06-19 DIAGNOSIS — Z79890 Hormone replacement therapy (postmenopausal): Secondary | ICD-10-CM

## 2017-06-19 DIAGNOSIS — N816 Rectocele: Secondary | ICD-10-CM

## 2017-06-19 DIAGNOSIS — N952 Postmenopausal atrophic vaginitis: Secondary | ICD-10-CM

## 2017-06-19 DIAGNOSIS — Z01411 Encounter for gynecological examination (general) (routine) with abnormal findings: Secondary | ICD-10-CM | POA: Diagnosis not present

## 2017-06-19 DIAGNOSIS — R351 Nocturia: Secondary | ICD-10-CM

## 2017-06-19 MED ORDER — ESTRADIOL 0.5 MG PO TABS: 0.5000 mg | ORAL_TABLET | Freq: Every day | ORAL | 11 refills | Status: DC

## 2017-06-19 NOTE — Addendum Note (Signed)
Addended by: Joaquin Music on: 06/19/2017 02:44 PM   Modules accepted: Orders

## 2017-06-19 NOTE — Progress Notes (Signed)
    JIMESHA RISING 08-May-1936 [id number]        81 y.o.  G3P3003 for breast and pelvic exam.  Past medical history,surgical history, problem list, medications, allergies, family history and social history were all reviewed and documented as reviewed in the EPIC chart.  ROS:  Performed with pertinent positives and negatives included in the history, assessment and plan.   Additional significant findings :  None   Exam: [name] assistant Vitals:   06/19/17 1210  BP: 118/74  Weight: 145 lb (65.8 kg)  Height: 5\' 4"  (1.626 m)   Body mass index is 24.89 kg/m.  General appearance:  Normal affect, orientation and appearance. Skin: Grossly normal HEENT: Without gross lesions.  No cervical or supraclavicular adenopathy. Thyroid normal.  Lungs:  Clear without wheezing, rales or rhonchi Cardiac: RR, without RMG Abdominal:  Soft, nontender, without masses, guarding, rebound, organomegaly or hernia Breasts:  Examined lying and sitting without masses, retractions, discharge or axillary adenopathy. Pelvic:  Ext, BUS, Vagina: With atrophic changes. First to second-degree rectocele noted. Cuff well supported. No significant cystocele.  Adnexa: Without masses or tenderness    Anus and perineum: Normal   Rectovaginal: Normal sphincter tone without palpated masses or tenderness.    Assessment/Plan:  81 y.o. G56P3003 female for breast and pelvic exam.  1. Postmenopausal/atrophic genital changes.  On estradiol 0.5 mg doing well and wants to continue. Status post TVH A&P repair in the past. Had been on Ogen but due to insurance changes switched to estradiol. I reviewed the risks versus benefits with her again to include the risks of thrombosis such as stroke heart attack DVT which may increase with age as well as the breast cancer issue. Benefits from a symptom relief standpoint as well as cardiovascular and bone health and started early discussed. Patient understands and accepts the risks. Refill 1  year provided. 2. Rectocele. Stable on serial exams. Asymptomatic to the patient. Continue to follow with serial exams. 3. Pap smear 2011. No Pap smear done today. 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 Normal Pap smears afterwards. We both agree to stop screening per current screening guidelines. 4. Mammography 05/2017. Continue with annual mammography when due. SBE monthly reviewed. Breast exam normal today. 5. Sigmoidoscopy 2014. Repeat at their recommended interval. 6. DEXA 2018 normal. Previous DEXA 2015 with recommendation repeated 5 years but [facility] did it at 2008. Recommend follow up DEXA at 5 year interval from 2018. 7. Health maintenance. No routine lab work done as patient does this elsewhere. Follow up 1 year, sooner as needed.   Anastasio Auerbach MD, 12:50 PM 06/19/2017

## 2017-06-19 NOTE — Patient Instructions (Signed)
Followup in one year for annual exam, sooner if any issues 

## 2017-06-22 DIAGNOSIS — Z23 Encounter for immunization: Secondary | ICD-10-CM | POA: Diagnosis not present

## 2017-09-13 DIAGNOSIS — I10 Essential (primary) hypertension: Secondary | ICD-10-CM | POA: Diagnosis not present

## 2017-09-13 DIAGNOSIS — F419 Anxiety disorder, unspecified: Secondary | ICD-10-CM | POA: Diagnosis not present

## 2017-09-13 DIAGNOSIS — E7800 Pure hypercholesterolemia, unspecified: Secondary | ICD-10-CM | POA: Diagnosis not present

## 2017-09-13 DIAGNOSIS — E1142 Type 2 diabetes mellitus with diabetic polyneuropathy: Secondary | ICD-10-CM | POA: Diagnosis not present

## 2017-09-13 DIAGNOSIS — Z7984 Long term (current) use of oral hypoglycemic drugs: Secondary | ICD-10-CM | POA: Diagnosis not present

## 2017-09-13 DIAGNOSIS — M6740 Ganglion, unspecified site: Secondary | ICD-10-CM | POA: Diagnosis not present

## 2017-10-25 DIAGNOSIS — I739 Peripheral vascular disease, unspecified: Secondary | ICD-10-CM | POA: Diagnosis not present

## 2017-10-25 DIAGNOSIS — F419 Anxiety disorder, unspecified: Secondary | ICD-10-CM | POA: Diagnosis not present

## 2017-10-25 DIAGNOSIS — Z1389 Encounter for screening for other disorder: Secondary | ICD-10-CM | POA: Diagnosis not present

## 2017-10-25 DIAGNOSIS — G4700 Insomnia, unspecified: Secondary | ICD-10-CM | POA: Diagnosis not present

## 2017-10-25 DIAGNOSIS — I10 Essential (primary) hypertension: Secondary | ICD-10-CM | POA: Diagnosis not present

## 2017-10-25 DIAGNOSIS — E7800 Pure hypercholesterolemia, unspecified: Secondary | ICD-10-CM | POA: Diagnosis not present

## 2017-10-25 DIAGNOSIS — L821 Other seborrheic keratosis: Secondary | ICD-10-CM | POA: Diagnosis not present

## 2017-10-25 DIAGNOSIS — Z0000 Encounter for general adult medical examination without abnormal findings: Secondary | ICD-10-CM | POA: Diagnosis not present

## 2017-10-25 DIAGNOSIS — E1142 Type 2 diabetes mellitus with diabetic polyneuropathy: Secondary | ICD-10-CM | POA: Diagnosis not present

## 2017-10-31 ENCOUNTER — Other Ambulatory Visit: Payer: Self-pay | Admitting: Gynecology

## 2018-02-09 DIAGNOSIS — S60222A Contusion of left hand, initial encounter: Secondary | ICD-10-CM | POA: Diagnosis not present

## 2018-03-09 DIAGNOSIS — M25561 Pain in right knee: Secondary | ICD-10-CM | POA: Diagnosis not present

## 2018-03-19 ENCOUNTER — Ambulatory Visit: Payer: Medicare Other | Admitting: Cardiovascular Disease

## 2018-03-26 ENCOUNTER — Ambulatory Visit (HOSPITAL_COMMUNITY)
Admission: RE | Admit: 2018-03-26 | Discharge: 2018-03-26 | Disposition: A | Discharge status: Home / Self Care | Payer: Medicare Other | Source: Ambulatory Visit | Attending: Cardiology | Admitting: Cardiology

## 2018-03-26 DIAGNOSIS — I10 Essential (primary) hypertension: Secondary | ICD-10-CM | POA: Insufficient documentation

## 2018-03-26 DIAGNOSIS — R278 Other lack of coordination: Secondary | ICD-10-CM | POA: Diagnosis not present

## 2018-03-26 DIAGNOSIS — E119 Type 2 diabetes mellitus without complications: Secondary | ICD-10-CM | POA: Diagnosis not present

## 2018-03-26 DIAGNOSIS — E785 Hyperlipidemia, unspecified: Secondary | ICD-10-CM | POA: Insufficient documentation

## 2018-03-26 DIAGNOSIS — I739 Peripheral vascular disease, unspecified: Secondary | ICD-10-CM | POA: Insufficient documentation

## 2018-03-26 DIAGNOSIS — I2510 Atherosclerotic heart disease of native coronary artery without angina pectoris: Secondary | ICD-10-CM | POA: Insufficient documentation

## 2018-03-26 DIAGNOSIS — I70203 Unspecified atherosclerosis of native arteries of extremities, bilateral legs: Secondary | ICD-10-CM | POA: Diagnosis not present

## 2018-03-26 DIAGNOSIS — M25561 Pain in right knee: Secondary | ICD-10-CM | POA: Diagnosis not present

## 2018-03-26 DIAGNOSIS — Z87891 Personal history of nicotine dependence: Secondary | ICD-10-CM | POA: Insufficient documentation

## 2018-03-26 DIAGNOSIS — Z95828 Presence of other vascular implants and grafts: Secondary | ICD-10-CM | POA: Diagnosis not present

## 2018-03-28 DIAGNOSIS — M25561 Pain in right knee: Secondary | ICD-10-CM | POA: Diagnosis not present

## 2018-03-28 DIAGNOSIS — R278 Other lack of coordination: Secondary | ICD-10-CM | POA: Diagnosis not present

## 2018-04-02 ENCOUNTER — Encounter (HOSPITAL_COMMUNITY): Payer: Medicare Other

## 2018-04-02 DIAGNOSIS — F419 Anxiety disorder, unspecified: Secondary | ICD-10-CM | POA: Diagnosis not present

## 2018-04-02 DIAGNOSIS — M19041 Primary osteoarthritis, right hand: Secondary | ICD-10-CM | POA: Diagnosis not present

## 2018-04-02 DIAGNOSIS — E1142 Type 2 diabetes mellitus with diabetic polyneuropathy: Secondary | ICD-10-CM | POA: Diagnosis not present

## 2018-04-02 DIAGNOSIS — M6740 Ganglion, unspecified site: Secondary | ICD-10-CM | POA: Diagnosis not present

## 2018-04-04 DIAGNOSIS — R278 Other lack of coordination: Secondary | ICD-10-CM | POA: Diagnosis not present

## 2018-04-04 DIAGNOSIS — M25561 Pain in right knee: Secondary | ICD-10-CM | POA: Diagnosis not present

## 2018-04-06 DIAGNOSIS — M25561 Pain in right knee: Secondary | ICD-10-CM | POA: Insufficient documentation

## 2018-04-06 DIAGNOSIS — R278 Other lack of coordination: Secondary | ICD-10-CM | POA: Diagnosis not present

## 2018-04-13 ENCOUNTER — Telehealth: Payer: Self-pay | Admitting: *Deleted

## 2018-04-13 DIAGNOSIS — R278 Other lack of coordination: Secondary | ICD-10-CM | POA: Diagnosis not present

## 2018-04-13 NOTE — Telephone Encounter (Signed)
Not unusual to see symptoms wax and wane.  Also lots of other reasons for hot flushes other than female hormone disturbances.  It would be unusual this would be due to estrogen changes at her age.  I would not recommend changing her estradiol dose but I would monitor these and if they would continue follow-up with her primary physician for other reasons to be having the symptoms.

## 2018-04-13 NOTE — Telephone Encounter (Signed)
Patient takes estradiol 0.5 mg tablet daily, states this week she has noticed increased hot flashes and night sweats. Please advise

## 2018-04-13 NOTE — Telephone Encounter (Signed)
Left message for pt to call.

## 2018-04-16 DIAGNOSIS — R278 Other lack of coordination: Secondary | ICD-10-CM | POA: Diagnosis not present

## 2018-04-17 ENCOUNTER — Encounter: Payer: Self-pay | Admitting: Cardiovascular Disease

## 2018-04-17 ENCOUNTER — Ambulatory Visit (INDEPENDENT_AMBULATORY_CARE_PROVIDER_SITE_OTHER): Payer: Medicare Other | Admitting: Cardiovascular Disease

## 2018-04-17 VITALS — BP 114/60 | HR 64 | Ht 64.0 in | Wt 154.0 lb

## 2018-04-17 DIAGNOSIS — I739 Peripheral vascular disease, unspecified: Secondary | ICD-10-CM | POA: Diagnosis not present

## 2018-04-17 DIAGNOSIS — R0989 Other specified symptoms and signs involving the circulatory and respiratory systems: Secondary | ICD-10-CM | POA: Diagnosis not present

## 2018-04-17 NOTE — Telephone Encounter (Signed)
Left message for pt to call.

## 2018-04-17 NOTE — Progress Notes (Signed)
04/17/2018 Doris Carr   [birth year]  [id number]  Primary Physician Lawerance Cruel, MD Primary Cardiologist: Lorretta Harp MD [name], [place]  HPI:  Doris Carr is a 82 y.o. mildly overweight married Caucasian female mother of 46, grandmother of 4 grandchildren by her husband Fraser Din today.  She was referred by Dr. Burt Knack for peripheral vascular evaluation because of known PAD with an abnormal Doppler study.  She does have a history of treated hypertension, hyper lipidemia has diabetes as well.  She is never had a heart attack or stroke.  She had a normal cath in 2006.  She is statin intolerant.  She had a right common iliac artery stent and a left SFA stent performed Dr. Burt Knack 2/13.  Recent Dopplers suggest right common iliac artery stenosis which is high-grade although she is completely asymptomatic from this.  She has had a right total knee replacement by Dr. Gladstone Lighter is currently walking with a walker mostly around the house she also complains of some instability of gait.   Current Meds  Medication Sig  . acetaminophen (TYLENOL) 650 MG CR tablet Take 650 mg by mouth 2 (two) times daily as needed (for arthritis pain.).  Marland Kitchen amLODipine (NORVASC) 5 MG tablet Take 5 mg by mouth 2 (two) times daily.  . carvedilol (COREG) 25 MG tablet Take 25 mg by mouth 2 (two) times daily.  . chlorthalidone (HYGROTON) 25 MG tablet Take 12.5 mg by mouth daily.  . citalopram (CELEXA) 20 MG tablet Take 20 mg by mouth daily.    . clonazePAM (KLONOPIN) 0.5 MG tablet Take 0.25-0.5 mg by mouth 2 (two) times daily. 0.25 mg in the morning, 0.5 mg in the evening  . clopidogrel (PLAVIX) 75 MG tablet TAKE 1 TABLET(75 MG) BY MOUTH DAILY  . Cyanocobalamin (VITAMIN B-12 PO) Take 1 tablet by mouth daily.  Marland Kitchen estradiol (ESTRACE) 0.5 MG tablet Take 1 tablet (0.5 mg total) by mouth daily.  Marland Kitchen estradiol (ESTRACE) 0.5 MG tablet TAKE 1 TABLET(0.5 MG) BY MOUTH DAILY  . L-Methylfolate-B6-B12 (METANX) 3-35-2 MG TABS Take 1  tablet by mouth daily.   Marland Kitchen losartan (COZAAR) 50 MG tablet TAKE 1 TABLET BY MOUTH AT BEDTIME  . metFORMIN (GLUCOPHAGE) 500 MG tablet Take 500 mg by mouth at bedtime.  . traZODone (DESYREL) 50 MG tablet Take 50 mg by mouth at bedtime. HALF A PILL AT BEDTIME     Allergies  Allergen Reactions  . Ciprofloxacin Nausea And Vomiting  . Codeine Nausea And Vomiting  . Darvocet [Propoxyphene N-Acetaminophen] Nausea Only  . Darvon Nausea Only  . Hydralazine Other (See Comments)    Unsure of reaction  . Hydrocodone Nausea And Vomiting  . Morphine And Related Nausea And Vomiting  . Pentazocine Lactate Nausea And Vomiting  . Potassium-Containing Compounds Nausea Only  . Procaine Hcl Nausea And Vomiting  . Sulfa Drugs Cross Reactors Rash    Social History   Socioeconomic History  . Marital status: Married    Spouse name: Not on file  . Number of children: Not on file  . Years of education: Not on file  . Highest education level: Not on file  Occupational History  . Not on file  Social Needs  . Financial resource strain: Not on file  . Food insecurity:    Worry: Not on file    Inability: Not on file  . Transportation needs:    Medical: Not on file    Non-medical: Not on file  Tobacco Use  . Smoking status: Former Smoker    Packs/day: 0.80    Years: 15.00    Pack years: 12.00    Types: Cigarettes    Last attempt to quit: 11/03/1984    Years since quitting: 33.4  . Smokeless tobacco: Never Used  Substance and Sexual Activity  . Alcohol use: No    Alcohol/week: 0.0 oz  . Drug use: No  . Sexual activity: Yes    Birth control/protection: Surgical, Post-menopausal    Comment: 1st intercourse 27 yo-1 partner  Lifestyle  . Physical activity:    Days per week: Not on file    Minutes per session: Not on file  . Stress: Not on file  Relationships  . Social connections:    Talks on phone: Not on file    Gets together: Not on file    Attends religious service: Not on file    Active  member of club or organization: Not on file    Attends meetings of clubs or organizations: Not on file    Relationship status: Not on file  . Intimate partner violence:    Fear of current or ex partner: Not on file    Emotionally abused: Not on file    Physically abused: Not on file    Forced sexual activity: Not on file  Other Topics Concern  . Not on file  Social History Narrative  . Not on file     Review of Systems: General: negative for chills, fever, night sweats or weight changes.  Cardiovascular: negative for chest pain, dyspnea on exertion, edema, orthopnea, palpitations, paroxysmal nocturnal dyspnea or shortness of breath Dermatological: negative for rash Respiratory: negative for cough or wheezing Urologic: negative for hematuria Abdominal: negative for nausea, vomiting, diarrhea, bright red blood per rectum, melena, or hematemesis Neurologic: negative for visual changes, syncope, or dizziness All other systems reviewed and are otherwise negative except as noted above.    Blood pressure 114/60, pulse 64, height 5\' 4"  (1.626 m), weight 154 lb (69.9 kg).  General appearance: alert and no distress Neck: no adenopathy, no JVD, supple, symmetrical, trachea midline, thyroid not enlarged, symmetric, no tenderness/mass/nodules and Right carotid bruit Lungs: clear to auscultation bilaterally Heart: regular rate and rhythm, S1, S2 normal, no murmur, click, rub or gallop Extremities: extremities normal, atraumatic, no cyanosis or edema Pulses: 2+ and symmetric Skin: Skin color, texture, turgor normal. No rashes or lesions Neurologic: Alert and oriented X 3, normal strength and tone. Normal symmetric reflexes. Normal coordination and gait  EKG sinus rhythm at 64 with left bundle branch block.  I personally reviewed this EKG.  ASSESSMENT AND PLAN:   PVD (peripheral vascular disease) ([place]) History of PAD status post right common iliac and left SFA intervention by Dr. Burt Knack  February 2013 she had recent Dopplers that revealed a high-grade disease in her right common iliac artery suggesting "in-stent restenosis with a patent left SFA stent.  She is really minimally ambulatory and walks with a walker principally because having undergone recent right total knee replacement and being unstable on her legs.  She walks mostly around the house.  She denies claudication.  Despite the ultrasound evidence of restenosis at this point I do not feel invasive approach is warranted given her paucity of symptoms.  We will continue to follow her noninvasively and I will see her back in 1 year.  Right carotid bruit Right carotid bruit on exam today.  Will check carotid Doppler studies in light of her  vascular disease.      Lorretta Harp MD FACP,FACC,FAHA, [facility] 04/17/2018 10:51 AM

## 2018-04-17 NOTE — Patient Instructions (Signed)
Medication Instructions:  Your physician recommends that you continue on your current medications as directed. Please refer to the Current Medication list given to you today.   Labwork: none  Testing/Procedures: Your physician has requested that you have a carotid duplex. This test is an ultrasound of the carotid arteries in your neck. It looks at blood flow through these arteries that supply the brain with blood. Allow one hour for this exam. There are no restrictions or special instructions.SCHEDULE NOW  Your physician has requested that you have an ankle brachial index (ABI). During this test an ultrasound and blood pressure cuff are used to evaluate the arteries that supply the arms and legs with blood. Allow thirty minutes for this exam. There are no restrictions or special instructions. Your physician has requested that you have an aorta/iliac duplex. During this test, an ultrasound is used to evaluate blood flow to the aorta and iliac arteries. Allow one hour for this exam. Do not eat after midnight the day before and avoid carbonated beverages. This will be take place at [address], [address]. SCHEDULE IN June 2020    Follow-Up: Your physician wants you to follow-up in: 12 months with Dr. Gwenlyn Found. You will receive a reminder letter in the mail two months in advance. If you don't receive a letter, please call our office to schedule the follow-up appointment.   Any Other Special Instructions Will Be Listed Below (If Applicable).     If you need a refill on your cardiac medications before your next appointment, please call your pharmacy.

## 2018-04-17 NOTE — Assessment & Plan Note (Signed)
History of PAD status post right common iliac and left SFA intervention by Dr. Burt Knack February 2013 she had recent Dopplers that revealed a high-grade disease in her right common iliac artery suggesting "in-stent restenosis with a patent left SFA stent.  She is really minimally ambulatory and walks with a walker principally because having undergone recent right total knee replacement and being unstable on her legs.  She walks mostly around the house.  She denies claudication.  Despite the ultrasound evidence of restenosis at this point I do not feel invasive approach is warranted given her paucity of symptoms.  We will continue to follow her noninvasively and I will see her back in 1 year.

## 2018-04-17 NOTE — Assessment & Plan Note (Signed)
Right carotid bruit on exam today.  Will check carotid Doppler studies in light of her vascular disease.

## 2018-04-18 ENCOUNTER — Other Ambulatory Visit: Payer: Self-pay | Admitting: Cardiovascular Disease

## 2018-04-18 DIAGNOSIS — I6523 Occlusion and stenosis of bilateral carotid arteries: Secondary | ICD-10-CM

## 2018-04-18 DIAGNOSIS — R0989 Other specified symptoms and signs involving the circulatory and respiratory systems: Secondary | ICD-10-CM

## 2018-04-19 DIAGNOSIS — R278 Other lack of coordination: Secondary | ICD-10-CM | POA: Diagnosis not present

## 2018-04-20 DIAGNOSIS — R278 Other lack of coordination: Secondary | ICD-10-CM | POA: Diagnosis not present

## 2018-04-20 DIAGNOSIS — M25561 Pain in right knee: Secondary | ICD-10-CM | POA: Diagnosis not present

## 2018-04-22 ENCOUNTER — Other Ambulatory Visit: Payer: Self-pay | Admitting: Gynecology

## 2018-04-23 ENCOUNTER — Ambulatory Visit (HOSPITAL_COMMUNITY)
Admission: RE | Admit: 2018-04-23 | Discharge: 2018-04-23 | Disposition: A | Discharge status: Home / Self Care | Payer: Medicare Other | Source: Ambulatory Visit | Attending: Cardiovascular Disease | Admitting: Cardiovascular Disease

## 2018-04-23 ENCOUNTER — Other Ambulatory Visit: Payer: Self-pay | Admitting: Gynecology

## 2018-04-23 DIAGNOSIS — I6523 Occlusion and stenosis of bilateral carotid arteries: Secondary | ICD-10-CM | POA: Diagnosis not present

## 2018-04-23 DIAGNOSIS — R0989 Other specified symptoms and signs involving the circulatory and respiratory systems: Secondary | ICD-10-CM

## 2018-04-25 ENCOUNTER — Other Ambulatory Visit: Payer: Self-pay | Admitting: *Deleted

## 2018-04-25 DIAGNOSIS — I6523 Occlusion and stenosis of bilateral carotid arteries: Secondary | ICD-10-CM

## 2018-04-25 DIAGNOSIS — R278 Other lack of coordination: Secondary | ICD-10-CM | POA: Diagnosis not present

## 2018-04-30 DIAGNOSIS — R278 Other lack of coordination: Secondary | ICD-10-CM | POA: Diagnosis not present

## 2018-05-03 DIAGNOSIS — R278 Other lack of coordination: Secondary | ICD-10-CM | POA: Diagnosis not present

## 2018-05-08 DIAGNOSIS — R278 Other lack of coordination: Secondary | ICD-10-CM | POA: Diagnosis not present

## 2018-05-15 DIAGNOSIS — R278 Other lack of coordination: Secondary | ICD-10-CM | POA: Diagnosis not present

## 2018-05-22 DIAGNOSIS — R278 Other lack of coordination: Secondary | ICD-10-CM | POA: Diagnosis not present

## 2018-05-28 ENCOUNTER — Other Ambulatory Visit: Payer: Self-pay | Admitting: Cardiovascular Disease

## 2018-05-28 DIAGNOSIS — I739 Peripheral vascular disease, unspecified: Secondary | ICD-10-CM

## 2018-05-29 DIAGNOSIS — R278 Other lack of coordination: Secondary | ICD-10-CM | POA: Diagnosis not present

## 2018-06-07 ENCOUNTER — Other Ambulatory Visit: Payer: Self-pay | Admitting: Cardiovascular Disease

## 2018-06-20 DIAGNOSIS — Z23 Encounter for immunization: Secondary | ICD-10-CM | POA: Diagnosis not present

## 2018-06-21 ENCOUNTER — Encounter: Payer: Self-pay | Admitting: Gynecology

## 2018-06-21 ENCOUNTER — Ambulatory Visit (INDEPENDENT_AMBULATORY_CARE_PROVIDER_SITE_OTHER): Payer: Medicare Other | Admitting: Gynecology

## 2018-06-21 VITALS — BP 124/82 | Ht 64.0 in | Wt 156.0 lb

## 2018-06-21 DIAGNOSIS — Z79890 Hormone replacement therapy (postmenopausal): Secondary | ICD-10-CM

## 2018-06-21 DIAGNOSIS — N816 Rectocele: Secondary | ICD-10-CM

## 2018-06-21 DIAGNOSIS — Z01419 Encounter for gynecological examination (general) (routine) without abnormal findings: Secondary | ICD-10-CM

## 2018-06-21 DIAGNOSIS — N952 Postmenopausal atrophic vaginitis: Secondary | ICD-10-CM

## 2018-06-21 MED ORDER — ESTRADIOL 0.5 MG PO TABS: 0.5000 mg | ORAL_TABLET | Freq: Every day | ORAL | 4 refills | Status: DC

## 2018-06-21 NOTE — Patient Instructions (Signed)
Follow-up in 1 year, sooner as needed. 

## 2018-06-21 NOTE — Progress Notes (Signed)
    Doris Carr April 04, 1936 [id number]        82 y.o.  [id number] for breast and pelvic exam.  Without gynecologic complaints.  Past medical history,surgical history, problem list, medications, allergies, family history and social history were all reviewed and documented as reviewed in the EPIC chart.  ROS:  Performed with pertinent positives and negatives included in the history, assessment and plan.   Additional significant findings : None   Exam: [name] assistant Vitals:   06/21/18 1108  BP: 124/82  Weight: 156 lb (70.8 kg)  Height: 5\' 4"  (1.626 m)   Body mass index is 26.78 kg/m.  General appearance:  Normal affect, orientation and appearance. Skin: Grossly normal HEENT: Without gross lesions.  No cervical or supraclavicular adenopathy. Thyroid normal.  Lungs:  Clear without wheezing, rales or rhonchi Cardiac: RR, without RMG Abdominal:  Soft, nontender, without masses, guarding, rebound, organomegaly or hernia Breasts:  Examined lying and sitting without masses, retractions, discharge or axillary adenopathy. Pelvic:  Ext, BUS, Vagina: With atrophic changes.  Cuff well supported.  First to second-degree rectocele.  No significant cystocele  Adnexa: Without masses or tenderness    Anus and perineum: Normal   Rectovaginal: Normal sphincter tone without palpated masses or tenderness.    Assessment/Plan:  82 y.o. G11P3003 female for breast and pelvic exam.   1. Postmenopausal/atrophic genital changes.  Continues on estradiol 0.5 mg daily.  Status post TVH A&P repair in the past.  We again discussed the issues of HRT with aging and increased risk of thrombosis.  The patient feels well on the HRT and strongly desires to continue.  She feels it is a quality of life issue.  Refill x1 year provided. 2. Rectocele.  Stable on serial exams.  Asymptomatic to the patient.  Continue to monitor.  Report any issues 3. DEXA 2018 normal.  Plan repeat at 5-year interval. 4. Pap smear 2011.   No Pap smear done today.  We both agree to stop screening per current screening guidelines based on age and hysterectomy history. 5. Sigmoidoscopy 2014.  She will follow-up with her primary's recommendations for colon screening. 6. Health maintenance.  No routine lab work done as patient does this elsewhere.  Follow-up 1 year, sooner as needed.   Anastasio Auerbach MD, 11:32 AM 06/21/2018

## 2018-07-12 DIAGNOSIS — R350 Frequency of micturition: Secondary | ICD-10-CM | POA: Diagnosis not present

## 2018-07-19 DIAGNOSIS — R05 Cough: Secondary | ICD-10-CM | POA: Diagnosis not present

## 2018-07-19 DIAGNOSIS — J069 Acute upper respiratory infection, unspecified: Secondary | ICD-10-CM | POA: Diagnosis not present

## 2018-07-19 DIAGNOSIS — R350 Frequency of micturition: Secondary | ICD-10-CM | POA: Diagnosis not present

## 2018-07-30 ENCOUNTER — Telehealth: Payer: Self-pay | Admitting: *Deleted

## 2018-07-30 NOTE — Telephone Encounter (Signed)
Patient called c/o hot flashes x 1 week, sweating night and flashes during the days, unable to sleep due to sweating. Patient said she just started having symptoms. Taking estradiol 0.5 mg tablet.  Please advise

## 2018-07-30 NOTE — Telephone Encounter (Signed)
Patient informed. 

## 2018-07-30 NOTE — Telephone Encounter (Signed)
There are many reasons to have hot flushes and sweats besides female hormone imbalance.  At age 82 with new onset symptoms I would recommend following up with her primary physician to be evaluated for other causes.

## 2018-08-02 ENCOUNTER — Encounter

## 2018-08-08 ENCOUNTER — Encounter: Payer: Self-pay | Admitting: Gynecology

## 2018-08-08 DIAGNOSIS — Z1231 Encounter for screening mammogram for malignant neoplasm of breast: Secondary | ICD-10-CM | POA: Diagnosis not present

## 2018-09-07 DIAGNOSIS — R296 Repeated falls: Secondary | ICD-10-CM | POA: Diagnosis not present

## 2018-09-07 DIAGNOSIS — R232 Flushing: Secondary | ICD-10-CM | POA: Diagnosis not present

## 2018-09-07 DIAGNOSIS — H9209 Otalgia, unspecified ear: Secondary | ICD-10-CM | POA: Diagnosis not present

## 2018-09-07 DIAGNOSIS — R609 Edema, unspecified: Secondary | ICD-10-CM | POA: Diagnosis not present

## 2018-10-09 DIAGNOSIS — H9113 Presbycusis, bilateral: Secondary | ICD-10-CM | POA: Diagnosis not present

## 2018-10-09 DIAGNOSIS — H903 Sensorineural hearing loss, bilateral: Secondary | ICD-10-CM | POA: Diagnosis not present

## 2018-10-09 DIAGNOSIS — H9209 Otalgia, unspecified ear: Secondary | ICD-10-CM | POA: Diagnosis not present

## 2018-10-09 DIAGNOSIS — M26609 Unspecified temporomandibular joint disorder, unspecified side: Secondary | ICD-10-CM | POA: Diagnosis not present

## 2018-10-09 DIAGNOSIS — R42 Dizziness and giddiness: Secondary | ICD-10-CM | POA: Diagnosis not present

## 2018-10-29 DIAGNOSIS — E1142 Type 2 diabetes mellitus with diabetic polyneuropathy: Secondary | ICD-10-CM | POA: Diagnosis not present

## 2018-10-29 DIAGNOSIS — I10 Essential (primary) hypertension: Secondary | ICD-10-CM | POA: Diagnosis not present

## 2018-10-29 DIAGNOSIS — E7800 Pure hypercholesterolemia, unspecified: Secondary | ICD-10-CM | POA: Diagnosis not present

## 2018-10-30 DIAGNOSIS — M19041 Primary osteoarthritis, right hand: Secondary | ICD-10-CM | POA: Diagnosis not present

## 2018-10-30 DIAGNOSIS — E7800 Pure hypercholesterolemia, unspecified: Secondary | ICD-10-CM | POA: Diagnosis not present

## 2018-10-30 DIAGNOSIS — I739 Peripheral vascular disease, unspecified: Secondary | ICD-10-CM | POA: Diagnosis not present

## 2018-10-30 DIAGNOSIS — N183 Chronic kidney disease, stage 3 (moderate): Secondary | ICD-10-CM | POA: Diagnosis not present

## 2018-10-30 DIAGNOSIS — Z0000 Encounter for general adult medical examination without abnormal findings: Secondary | ICD-10-CM | POA: Diagnosis not present

## 2018-10-30 DIAGNOSIS — F419 Anxiety disorder, unspecified: Secondary | ICD-10-CM | POA: Diagnosis not present

## 2018-10-30 DIAGNOSIS — R296 Repeated falls: Secondary | ICD-10-CM | POA: Diagnosis not present

## 2018-10-30 DIAGNOSIS — I10 Essential (primary) hypertension: Secondary | ICD-10-CM | POA: Diagnosis not present

## 2018-10-30 DIAGNOSIS — E1142 Type 2 diabetes mellitus with diabetic polyneuropathy: Secondary | ICD-10-CM | POA: Diagnosis not present

## 2018-10-30 DIAGNOSIS — G4700 Insomnia, unspecified: Secondary | ICD-10-CM | POA: Diagnosis not present

## 2018-11-06 ENCOUNTER — Telehealth: Payer: Self-pay | Admitting: Cardiovascular Disease

## 2018-11-06 NOTE — Telephone Encounter (Signed)
Patient was called, notified that she had seen Dr.Berry the most recently, regarding his last note she had issues with her legs at that visit, but patient states it has worsened, and the last time this happened she had to have stents. Patient states she does not have sharpe leg pain, redness, or swelling at this time, but states she has difficulty walking throughout her home. I had a opening for Dr.Berry next week, and patient accepted that appointment. Patient advised to contact us back if any of the following symptoms above occur, or any chest pains, or cardiac symptoms occur. Patient verbalized understanding.

## 2018-11-06 NOTE — Telephone Encounter (Signed)
New Message   Patient is calling because she is experiencing some difficulty walking. She states that the last time this occurred she had to get some stents. She denies any shortness of breath. Please call to discuss.

## 2018-11-06 NOTE — Telephone Encounter (Signed)
Pt see's Dr. Gwenlyn Found

## 2018-11-14 ENCOUNTER — Encounter: Payer: Self-pay | Admitting: Cardiovascular Disease

## 2018-11-14 ENCOUNTER — Ambulatory Visit (INDEPENDENT_AMBULATORY_CARE_PROVIDER_SITE_OTHER): Payer: PPO | Admitting: Cardiovascular Disease

## 2018-11-14 VITALS — BP 120/68 | HR 62 | Ht 64.0 in | Wt 160.0 lb

## 2018-11-14 DIAGNOSIS — I739 Peripheral vascular disease, unspecified: Secondary | ICD-10-CM

## 2018-11-14 DIAGNOSIS — I447 Left bundle-branch block, unspecified: Secondary | ICD-10-CM

## 2018-11-14 DIAGNOSIS — I6523 Occlusion and stenosis of bilateral carotid arteries: Secondary | ICD-10-CM | POA: Diagnosis not present

## 2018-11-14 NOTE — Patient Instructions (Signed)
Medication Instructions:  Your physician recommends that you continue on your current medications as directed. Please refer to the Current Medication list given to you today.  If you need a refill on your cardiac medications before your next appointment, please call your pharmacy.   Lab work: NONE If you have labs (blood work) drawn today and your tests are completely normal, you will receive your results only by: Marland Kitchen MyChart Message (if you have MyChart) OR . A paper copy in the mail If you have any lab test that is abnormal or we need to change your treatment, we will call you to review the results.  Testing/Procedures: Your physician has requested that you have a carotid duplex. This test is an ultrasound of the carotid arteries in your neck. It looks at blood flow through these arteries that supply the brain with blood. Allow one hour for this exam. There are no restrictions or special instructions.  Your physician has requested that you have a lower or upper extremity arterial duplex. This test is an ultrasound of the arteries in the legs or arms. It looks at arterial blood flow in the legs and arms. Allow one hour for Lower and Upper Arterial scans. There are no restrictions or special instructions  Your physician has requested that you have an ankle brachial index (ABI). During this test an ultrasound and blood pressure cuff are used to evaluate the arteries that supply the arms and legs with blood. Allow thirty minutes for this exam. There are no restrictions or special instructions.  Your physician has requested that you have an aorta/iliac duplex. During this test, an ultrasound is used to evaluate blood flow to the aorta and iliac arteries. Allow one hour for this exam. Do not eat after midnight the day before and avoid carbonated beverages.  SCHEDULE ALL DOPPLER STUDIES June/JULY 2020  Follow-Up: At [facility], you and your health needs are our priority.  As part of our  continuing mission to provide you with exceptional heart care, we have created designated Provider Care Teams.  These Care Teams include your primary Cardiologist (physician) and Advanced Practice Providers (APPs -  Physician Assistants and Nurse Practitioners) who all work together to provide you with the care you need, when you need it. . You will need a follow up appointment in 6 months.  Please call our office 2 months in advance to schedule this appointment.  You may see Dr. Gwenlyn Found or one of the following Advanced Practice Providers on your designated Care Team:   . Kerin Ransom, [place] . Almyra Deforest, PA-C . Fabian Sharp, PA-C . Jory Sims, DNP . Rosaria Ferries, PA-C . Roby Lofts, PA-C . Sande Rives, PA-C  Any Other Special Instructions Will Be Listed Below (If Applicable). PLEASE CALL us ONCE YOU HAVE MADE A DECISION ON WHETHER OR NOT TO PROCEED WITH A PV ANGIOGRAM

## 2018-11-14 NOTE — Assessment & Plan Note (Signed)
History of PAD status post right common iliac and left SFA stent by Dr. Burt Knack February 2013.  Recent lower extremity arterial Dopplers performed 03/26/2018 revealed a right ABI 0.79 and a left of 0.87.  She did have a high-frequency signal in her right common iliac artery stent.  She has had right total knee replacement by Dr. Gladstone Lighter 2 years ago and walks with a walker.  There are some balance issues.  She complains of right greater than left lower extremity pain although her left lower extremity really has no significant PAD.  We have talked about the possibility of angiography and intervention which she and her husband Doris Carr are considering.

## 2018-11-14 NOTE — Progress Notes (Signed)
11/14/2018 Doris Carr   06-08-1936  [id number]  Primary Physician Lawerance Cruel, MD Primary Cardiologist: Lorretta Harp MD [name], [place]  HPI:  Doris Carr is a 83 y.o.  mildly overweight married Caucasian female mother of 9, grandmother of 4 grandchildren by her husband Fraser Din today.  She was referred by Dr. Burt Knack for peripheral vascular evaluation because of known PAD with an abnormal Doppler study.    I last saw her in the office 04/17/2018.  She does have a history of treated hypertension, hyper lipidemia has diabetes as well.  She is never had a heart attack or stroke.  She had a normal cath in 2006.  She is statin intolerant.  She had a right common iliac artery stent and a left SFA stent performed Dr. Burt Knack 2/13.  Recent Dopplers suggest right common iliac artery stenosis which is high-grade although she is completely asymptomatic from this.  She has had a right total knee replacement by Dr. Gladstone Lighter is currently walking with a walker mostly around the house she also complains of some instability of gait. Since I saw her 6 months ago she is currently walking with a walker.  She does complain of worse pain in her right greater than left lower extremity.  It is hard to distinguish whether this is vascular or orthopedic.  Current Meds  Medication Sig  . acetaminophen (TYLENOL) 650 MG CR tablet Take 650 mg by mouth 2 (two) times daily as needed (for arthritis pain.).  Marland Kitchen amLODipine (NORVASC) 5 MG tablet Take 5 mg by mouth 2 (two) times daily.  . carvedilol (COREG) 25 MG tablet Take 25 mg by mouth 2 (two) times daily.  . chlorthalidone (HYGROTON) 25 MG tablet Take 12.5 mg by mouth daily.  . citalopram (CELEXA) 20 MG tablet Take 20 mg by mouth daily.    . clonazePAM (KLONOPIN) 0.5 MG tablet Take 0.25-0.5 mg by mouth 2 (two) times daily. 0.25 mg in the morning, 0.5 mg in the evening  . clopidogrel (PLAVIX) 75 MG tablet TAKE 1 TABLET(75 MG) BY MOUTH DAILY  . Cyanocobalamin  (VITAMIN B-12 PO) Take 1 tablet by mouth daily.  Marland Kitchen estradiol (ESTRACE) 0.5 MG tablet Take 1 tablet (0.5 mg total) by mouth daily.  Marland Kitchen estradiol (ESTRACE) 0.5 MG tablet Take 1 tablet (0.5 mg total) by mouth daily.  Marland Kitchen L-Methylfolate-B6-B12 (METANX) 3-35-2 MG TABS Take 1 tablet by mouth daily.   Marland Kitchen losartan (COZAAR) 50 MG tablet TAKE 1 TABLET BY MOUTH AT BEDTIME  . metFORMIN (GLUCOPHAGE) 500 MG tablet Take 500 mg by mouth at bedtime.  . traZODone (DESYREL) 50 MG tablet Take 50 mg by mouth at bedtime. HALF A PILL AT BEDTIME     Allergies  Allergen Reactions  . Ciprofloxacin Nausea And Vomiting  . Codeine Nausea And Vomiting  . Darvocet [Propoxyphene N-Acetaminophen] Nausea Only  . Darvon Nausea Only  . Hydralazine Other (See Comments)    Unsure of reaction  . Hydrocodone Nausea And Vomiting  . Morphine And Related Nausea And Vomiting  . Pentazocine Lactate Nausea And Vomiting  . Potassium-Containing Compounds Nausea Only  . Procaine Hcl Nausea And Vomiting  . Sulfa Drugs Cross Reactors Rash    Social History   Socioeconomic History  . Marital status: Married    Spouse name: Not on file  . Number of children: Not on file  . Years of education: Not on file  . Highest education level: Not on file  Occupational History  . Not on file  Social Needs  . Financial resource strain: Not on file  . Food insecurity:    Worry: Not on file    Inability: Not on file  . Transportation needs:    Medical: Not on file    Non-medical: Not on file  Tobacco Use  . Smoking status: Former Smoker    Packs/day: 0.80    Years: 15.00    Pack years: 12.00    Types: Cigarettes    Last attempt to quit: 11/03/1984    Years since quitting: 34.0  . Smokeless tobacco: Never Used  Substance and Sexual Activity  . Alcohol use: No    Alcohol/week: 0.0 standard drinks  . Drug use: No  . Sexual activity: Yes    Birth control/protection: Surgical, Post-menopausal    Comment: 1st intercourse 44 yo-1 partner    Lifestyle  . Physical activity:    Days per week: Not on file    Minutes per session: Not on file  . Stress: Not on file  Relationships  . Social connections:    Talks on phone: Not on file    Gets together: Not on file    Attends religious service: Not on file    Active member of club or organization: Not on file    Attends meetings of clubs or organizations: Not on file    Relationship status: Not on file  . Intimate partner violence:    Fear of current or ex partner: Not on file    Emotionally abused: Not on file    Physically abused: Not on file    Forced sexual activity: Not on file  Other Topics Concern  . Not on file  Social History Narrative  . Not on file     Review of Systems: General: negative for chills, fever, night sweats or weight changes.  Cardiovascular: negative for chest pain, dyspnea on exertion, edema, orthopnea, palpitations, paroxysmal nocturnal dyspnea or shortness of breath Dermatological: negative for rash Respiratory: negative for cough or wheezing Urologic: negative for hematuria Abdominal: negative for nausea, vomiting, diarrhea, bright red blood per rectum, melena, or hematemesis Neurologic: negative for visual changes, syncope, or dizziness All other systems reviewed and are otherwise negative except as noted above.    Blood pressure 120/68, pulse 62, height 5\' 4"  (1.626 m), weight 160 lb (72.6 kg).  General appearance: alert and no distress Neck: no adenopathy, no carotid bruit, no JVD, supple, symmetrical, trachea midline and thyroid not enlarged, symmetric, no tenderness/mass/nodules Lungs: clear to auscultation bilaterally Heart: regular rate and rhythm, S1, S2 normal, no murmur, click, rub or gallop Extremities: extremities normal, atraumatic, no cyanosis or edema Pulses: 2+ and symmetric Skin: Skin color, texture, turgor normal. No rashes or lesions Neurologic: Alert and oriented X 3, normal strength and tone. Normal symmetric  reflexes. Normal coordination and gait  EKG sinus rhythm at 62 with left bundle branch block.  I personally reviewed this EKG.  ASSESSMENT AND PLAN:   PVD (peripheral vascular disease) ([place]) History of PAD status post right common iliac and left SFA stent by Dr. Burt Knack February 2013.  Recent lower extremity arterial Dopplers performed 03/26/2018 revealed a right ABI 0.79 and a left of 0.87.  She did have a high-frequency signal in her right common iliac artery stent.  She has had right total knee replacement by Dr. Gladstone Lighter 2 years ago and walks with a walker.  There are some balance issues.  She complains of right greater than  left lower extremity pain although her left lower extremity really has no significant PAD.  We have talked about the possibility of angiography and intervention which she and her husband Fraser Din are considering.      Lorretta Harp MD FACP,FACC,FAHA, [facility] 11/14/2018 12:15 PM

## 2018-11-20 ENCOUNTER — Ambulatory Visit (INDEPENDENT_AMBULATORY_CARE_PROVIDER_SITE_OTHER): Payer: PPO | Admitting: Neurology

## 2018-11-20 ENCOUNTER — Encounter: Payer: Self-pay | Admitting: Neurology

## 2018-11-20 DIAGNOSIS — G3281 Cerebellar ataxia in diseases classified elsewhere: Secondary | ICD-10-CM

## 2018-11-20 DIAGNOSIS — R531 Weakness: Secondary | ICD-10-CM | POA: Insufficient documentation

## 2018-11-20 DIAGNOSIS — R269 Unspecified abnormalities of gait and mobility: Secondary | ICD-10-CM | POA: Diagnosis not present

## 2018-11-20 NOTE — Progress Notes (Signed)
PATIENT: Doris Carr DOB: 08-23-1936  Chief Complaint  Patient presents with  . Balance Problems/Falls    She is here with her husband, Doris Carr.  Reports having weakness in her legs that is causing her frequent falls.  Denies dizziness.  She had some improvement with physical therapy.  She uses a walker to help with ambulation.  Marland Kitchen PCP    Dr. Lona Kettle     HISTORICAL  Doris Carr is a 83 years old female, seen in request by her primary care physician Dr. Harrington Challenger, Dwyane Luo for evaluation of balance issue, initial evaluation was on November 20, 2018.  She is accompanied by her husband Doris Carr at visit.  I have reviewed and summarized the referring note from the referring physician.  She had past medical history of hypertension, diabetes, peripheral vascular disease, she presented with bilateral lower exetreities swelling, pain with ambulation, she has stent in right common iliac artery, and left SFA, it did help her low back pain, she also had a history of right knee replacement in February 2018, previously she had significant gait abnormality, contributing to her peripheral vascular disease, right knee pain,  However, despite aggressive rehabilitation, no longer have significant right knee pain, she had progressive worsening gait abnormality, she complains of low back pain, but no radiating pain to bilateral lower extremity, denies significant neck pain, she has diabetic peripheral neuropathy, most recent A1c was 7.8,  She denies bowel and bladder incontinence, she was also noted to have gradual onset memory loss  REVIEW OF SYSTEMS: Full 14 system review of systems performed and notable only for swelling legs, easy bruising, flushing, weakness, confusion, joint pain, achy muscles  All other review of systems were negative.  ALLERGIES: Allergies  Allergen Reactions  . Ciprofloxacin Nausea And Vomiting  . Codeine Nausea And Vomiting  . Darvocet [Propoxyphene N-Acetaminophen] Nausea  Only  . Darvon Nausea Only  . Hydralazine Other (See Comments)    Unsure of reaction  . Hydrocodone Nausea And Vomiting  . Morphine And Related Nausea And Vomiting  . Pentazocine Lactate Nausea And Vomiting  . Potassium-Containing Compounds Nausea Only  . Procaine Hcl Nausea And Vomiting  . Sulfa Drugs Cross Reactors Rash    HOME MEDICATIONS: Current Outpatient Medications  Medication Sig Dispense Refill  . acetaminophen (TYLENOL) 650 MG CR tablet Take 650 mg by mouth 2 (two) times daily as needed (for arthritis pain.).    Marland Kitchen amLODipine (NORVASC) 5 MG tablet Take 5 mg by mouth 2 (two) times daily.    . carvedilol (COREG) 25 MG tablet Take 25 mg by mouth 2 (two) times daily.    . chlorthalidone (HYGROTON) 25 MG tablet Take 12.5 mg by mouth daily.    . citalopram (CELEXA) 20 MG tablet Take 20 mg by mouth daily.      . clonazePAM (KLONOPIN) 0.5 MG tablet Take 0.25-0.5 mg by mouth 2 (two) times daily. 0.25 mg in the morning, 0.5 mg in the evening    . clopidogrel (PLAVIX) 75 MG tablet TAKE 1 TABLET(75 MG) BY MOUTH DAILY 90 tablet 3  . Cyanocobalamin (VITAMIN B-12 PO) Take 1 tablet by mouth daily.    Marland Kitchen estradiol (ESTRACE) 0.5 MG tablet Take 1 tablet (0.5 mg total) by mouth daily. 90 tablet 4  . losartan (COZAAR) 50 MG tablet TAKE 1 TABLET BY MOUTH AT BEDTIME 90 tablet 3  . metFORMIN (GLUCOPHAGE) 500 MG tablet Take 500 mg by mouth at bedtime.  4  . traZODone (  DESYREL) 50 MG tablet Take 50 mg by mouth at bedtime. HALF A PILL AT BEDTIME     No current facility-administered medications for this visit.     PAST MEDICAL HISTORY: Past Medical History:  Diagnosis Date  . Anxiety   . Arthritis    osteoarthritis  . Back pain    sees a neurosurgeon  . Balance problems   . Balance problems   . Chronic diastolic heart failure (HCC)    Echo (8/12) with EF 60-65%, moderate LV hypertrophy, mild LVH  . Chronic renal insufficiency, stage III (moderate) (HCC)   . 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 CKD (chronic kidney disease)    likely due to DM2 and HTN  . Current tear of meniscus right knee  . Depression   . Diabetes mellitus    type  2  . Diastolic CHF, chronic ([place]) 06/12/2011  . Fecal incontinence 05/14/2013  . GERD (gastroesophageal reflux disease)   . History of hiatal hernia    inguinal hernia  . History of total knee arthroplasty, right 11/16/2016  . HLD (hyperlipidemia)    myalgias with statins  . Hyperlipidemia 09/14/2011  . Hypertension    This has been difficult to control. She has had renal artery doppler evaluation on two different occasions, both times with no evidence for renal artery stenosis. She has had urinary catecholeamine collection that was unremarkable. Edema with 10 mg amlodipine.   . Left bundle branch block   . Leg pain 05/14/2011  . Lower extremity edema    CHRONIC  . Lung nodule    VATS and biopsy in 2006 that was negative for malignancy  . Obesity   . Peripheral vascular disease ([place])    Lower extremity evaluation (12/12): ABI 0.79 right, 0.64 left; > 50% mid left SFA stenosis;  s/p L SFA stent and stent to R CIA 11/2011; ABI's post PTA - R 0.90, L 0.96 (TBI on R 0.51, L 0.61)  . Pneumonia    multiple times in childhood  . PONV (postoperative nausea and vomiting)   . Pre-operative cardiovascular examination 02/03/2016  . S/P cardiac catheterization    Left heart cath in 6/06 with normal coronaries, EF 60%.  . Syncope and collapse     PAST SURGICAL HISTORY: Past Surgical History:  Procedure Laterality Date  . ABDOMINAL AORTAGRAM N/A 11/09/2011   Procedure: ABDOMINAL Maxcine Ham;  Surgeon: Sherren Mocha, MD;  Location: [facility] CATH LAB;  Service: Cardiovascular;  Laterality: N/A;  . abdominal aortogram  11/09/2011  . ANAL RECTAL MANOMETRY N/A 05/08/2013   Procedure: ANAL RECTAL MANOMETRY;  Surgeon: Leighton Ruff, MD;  Location: [facility] ENDOSCOPY;  Service: Endoscopy;  Laterality: N/A;  . CARDIAC CATHETERIZATION  2006   NORMAL  CORONARIES  . CATARACT EXTRACTION    . CERVICAL CONE BIOPSY    . FLEXIBLE SIGMOIDOSCOPY N/A 05/08/2013   Procedure: FLEXIBLE SIGMOIDOSCOPY;  Surgeon: Leighton Ruff, MD;  Location: [facility] ENDOSCOPY;  Service: Endoscopy;  Laterality: N/A;  rigid proctoscope get from o.r.  . GYNECOLOGIC CRYOSURGERY    . Knee Surg     Arthroscopic  . RECTAL ULTRASOUND N/A 05/08/2013   Procedure: RECTAL ULTRASOUND;  Surgeon: Leighton Ruff, MD;  Location: [facility] ENDOSCOPY;  Service: Endoscopy;  Laterality: N/A;  . Stints in leg arteries    . THORACOTOMY    . THYMIC CYST REMOVAL    . TONSILLECTOMY    . TOTAL KNEE ARTHROPLASTY Right 11/16/2016   Procedure: RIGHT  TOTAL KNEE ARTHROPLASTY;  Surgeon: Latanya Maudlin, MD;  Location: [facility] ORS;  Service: Orthopedics;  Laterality: Right;  block  . VAGINAL HYSTERECTOMY  1985   WITH A/P REPAIR    FAMILY HISTORY: Family History  Problem Relation Age of Onset  . Alzheimer's disease Mother   . Hypertension Mother   . Heart disease Mother   . Alzheimer's disease Father   . Diabetes Paternal Grandfather     SOCIAL HISTORY: Social History   Socioeconomic History  . Marital status: Married    Spouse name: Not on file  . Number of children: 3  . Years of education: college  . Highest education level: Master's degree (e.g., MA, MS, MEng, MEd, MSW, MBA)  Occupational History  . Occupation: Retired  Scientific laboratory technician  . Financial resource strain: Not on file  . Food insecurity:    Worry: Not on file    Inability: Not on file  . Transportation needs:    Medical: Not on file    Non-medical: Not on file  Tobacco Use  . Smoking status: Former Smoker    Packs/day: 0.80    Years: 15.00    Pack years: 12.00    Types: Cigarettes    Last attempt to quit: 11/03/1984    Years since quitting: 34.0  . Smokeless tobacco: Never Used  Substance and Sexual Activity  . Alcohol use: No    Alcohol/week: 0.0 standard drinks  . Drug use: No  . Sexual activity: Yes    Birth control/protection:  Surgical, Post-menopausal    Comment: 1st intercourse 82 yo-1 partner  Lifestyle  . Physical activity:    Days per week: Not on file    Minutes per session: Not on file  . Stress: Not on file  Relationships  . Social connections:    Talks on phone: Not on file    Gets together: Not on file    Attends religious service: Not on file    Active member of club or organization: Not on file    Attends meetings of clubs or organizations: Not on file    Relationship status: Not on file  . Intimate partner violence:    Fear of current or ex partner: Not on file    Emotionally abused: Not on file    Physically abused: Not on file    Forced sexual activity: Not on file  Other Topics Concern  . Not on file  Social History Narrative   Lives at home with her husband.   Caffeine use: 1 cup per day.   Right-handed.     PHYSICAL EXAM   Vitals:   11/20/18 1425  BP: 124/70  Pulse: 70  Weight: 160 lb (72.6 kg)  Height: 5\' 4"  (1.626 m)    Not recorded      Body mass index is 27.46 kg/m.  PHYSICAL EXAMNIATION:  Gen: NAD, conversant, well nourised, obese, well groomed                     Cardiovascular: Regular rate rhythm, no peripheral edema, warm, nontender. Eyes: Conjunctivae clear without exudates or hemorrhage Neck: Supple, no carotid bruits. Pulmonary: Clear to auscultation bilaterally   NEUROLOGICAL EXAM:  MENTAL STATUS: Speech:    Speech is normal; fluent and spontaneous with normal comprehension.  Cognition:     Orientation to time, place and person     Normal recent and remote memory     Normal Attention span and concentration     Normal  Language, naming, repeating,spontaneous speech     Fund of knowledge   CRANIAL NERVES: CN II: Visual fields are full to confrontation.  Pupils are round equal and briskly reactive to light. CN III, IV, VI: extraocular movement are normal. No ptosis. CN V: Facial sensation is intact to pinprick in all 3 divisions bilaterally.  Corneal responses are intact.  CN VII: Face is symmetric with normal eye closure and smile. CN VIII: Hearing is normal to rubbing fingers CN IX, X: Palate elevates symmetrically. Phonation is normal. CN XI: Head turning and shoulder shrug are intact CN XII: Tongue is midline with normal movements and no atrophy.  MOTOR: There is no pronator drift of out-stretched arms. Muscle bulk and tone are normal. Muscle strength is normal.  REFLEXES: Reflexes are 2+ and symmetric at the biceps, triceps, trace at knees, and absent at ankles. Plantar responses are flexor.  SENSORY: Length dependent decreased to light touch, vibratory sensation to ankle levels  COORDINATION: Rapid alternating movements and fine finger movements are intact. There is no dysmetria on finger-to-nose and heel-knee-shin.    GAIT/STANCE: She needs assistance to get up from seated position, wide-based, cautious, unsteady gait   DIAGNOSTIC DATA (LABS, IMAGING, TESTING) - I reviewed patient records, labs, notes, testing and imaging myself where available.   ASSESSMENT AND PLAN  ASYIA HORNUNG is a 83 y.o. female   Gait abnormality  Likely multifactorial, including deconditioning, aging,  Evidence of peripheral neuropathy, proceed with EMG nerve conduction study  Hyperreflexia of bilateral upper extremity, need to rule out cervical spondylitic myelopathy, MRI of cervical spine  Refer her to home physical therapy  Short-term memory loss  MRI of the brain   Marcial Pacas, M.D. Ph.D.  [facility] [address] [place], [place] [address] Ph: [phone] Fax: [phone]  CC: Lawerance Cruel, MD

## 2018-11-21 ENCOUNTER — Telehealth: Payer: Self-pay | Admitting: Neurology

## 2018-11-21 NOTE — Telephone Encounter (Signed)
health team order sent to GI. No auth they will reach out to the pt to schedule.  °

## 2018-11-29 ENCOUNTER — Telehealth: Payer: Self-pay | Admitting: Neurology

## 2018-11-29 MED ORDER — ALPRAZOLAM 1 MG PO TABS: ORAL_TABLET | ORAL | 0 refills | Status: DC

## 2018-11-29 NOTE — Telephone Encounter (Signed)
Spoke to the patient and reviewed the allergies listed in her chart.  States she has taken Xanax in the past without any adverse side effects.  Dr. Krista Blue will provide prescription, per MRI protocols.  It will be sent to her pharmacy.

## 2018-11-29 NOTE — Telephone Encounter (Signed)
Pt states she is needing something to keep her calm for her MRI. She would like something called in for her to the [facility] on [address] and [address]. Please advise.

## 2018-11-29 NOTE — Telephone Encounter (Signed)
The prescription printed two additional times and both the extra copies have been destroyed.

## 2018-11-29 NOTE — Telephone Encounter (Signed)
Signed prescription has been faxed and confirmed to [facility] at [phone].

## 2018-12-04 ENCOUNTER — Ambulatory Visit
Admission: RE | Admit: 2018-12-04 | Discharge: 2018-12-04 | Disposition: A | Discharge status: Home / Self Care | Payer: PPO | Source: Ambulatory Visit | Attending: Neurology | Admitting: Neurology

## 2018-12-04 DIAGNOSIS — G3281 Cerebellar ataxia in diseases classified elsewhere: Secondary | ICD-10-CM

## 2018-12-06 ENCOUNTER — Telehealth: Payer: Self-pay | Admitting: Neurology

## 2018-12-06 NOTE — Telephone Encounter (Signed)
Spoke with pt. and husband and reviewed below MRI results. Confirmed 3/27 EMG/NCV appt. They verbalized understanding of same/fim

## 2018-12-06 NOTE — Telephone Encounter (Signed)
Please call patient, MRI of brain showed evidence of moderate generalized atrophy, small vessel disease, progression compared to previous scan in 2006.  MRI of cervical spine showed multilevel degenerative changes, variable degree of foraminal narrowing, no evidence of cord compression, there is subtle spinal cord signal changes within C5-6,  I will review MRI films with her at next follow-up visit     IMPRESSION:   MRI brain (without) demonstrating: - moderate perisylvian and mesial temporal atrophy. moderate ventriculomegaly on ex vacuo basis. - moderate chronic small vessel ischemic disease.  - no acute findings. - compared to MRI in 2006, there has been significant progression of atrophy and small vessel disease.  IMPRESSION:   MRI cervical spine (without) demonstrating: - At C5-6: disc bulging and uncovertebral joint hypertrophy with mild spinal stenosis and severe biforaminal stenosis; subtle STIR hyperintensity on sagittal views and T2 hyperintensity on axial views on the right side within the spinal cord, may represent myelomalacia vs artifact.. - At C4-5: uncovertebral joint hypertrophy and facet hypertrophy with moderate right foraminal stenosis. - At C6-7: uncovertebral joint hypertrophy with mild left foraminal stenosis. - Degenerative changes have progressed since MRI in 2008.

## 2018-12-28 ENCOUNTER — Encounter: Payer: PPO | Admitting: Neurology

## 2018-12-28 ENCOUNTER — Other Ambulatory Visit: Payer: Self-pay | Admitting: *Deleted

## 2018-12-31 MED ORDER — CLOPIDOGREL BISULFATE 75 MG PO TABS
75.0000 mg | ORAL_TABLET | Freq: Every day | ORAL | 1 refills | Status: DC
Start: 2018-12-31 — End: 2020-01-27

## 2019-01-14 DIAGNOSIS — M79643 Pain in unspecified hand: Secondary | ICD-10-CM | POA: Diagnosis not present

## 2019-01-14 DIAGNOSIS — M79642 Pain in left hand: Secondary | ICD-10-CM | POA: Diagnosis not present

## 2019-01-14 DIAGNOSIS — M79641 Pain in right hand: Secondary | ICD-10-CM | POA: Diagnosis not present

## 2019-01-14 DIAGNOSIS — M1990 Unspecified osteoarthritis, unspecified site: Secondary | ICD-10-CM | POA: Diagnosis not present

## 2019-01-14 DIAGNOSIS — E119 Type 2 diabetes mellitus without complications: Secondary | ICD-10-CM | POA: Diagnosis not present

## 2019-01-14 DIAGNOSIS — Z961 Presence of intraocular lens: Secondary | ICD-10-CM | POA: Diagnosis not present

## 2019-01-14 DIAGNOSIS — H2511 Age-related nuclear cataract, right eye: Secondary | ICD-10-CM | POA: Diagnosis not present

## 2019-01-14 DIAGNOSIS — H40023 Open angle with borderline findings, high risk, bilateral: Secondary | ICD-10-CM | POA: Diagnosis not present

## 2019-01-14 DIAGNOSIS — I10 Essential (primary) hypertension: Secondary | ICD-10-CM | POA: Diagnosis not present

## 2019-01-14 DIAGNOSIS — M2550 Pain in unspecified joint: Secondary | ICD-10-CM | POA: Diagnosis not present

## 2019-01-14 DIAGNOSIS — M064 Inflammatory polyarthropathy: Secondary | ICD-10-CM | POA: Diagnosis not present

## 2019-01-14 DIAGNOSIS — H35342 Macular cyst, hole, or pseudohole, left eye: Secondary | ICD-10-CM | POA: Diagnosis not present

## 2019-01-14 DIAGNOSIS — M545 Low back pain: Secondary | ICD-10-CM | POA: Diagnosis not present

## 2019-03-14 ENCOUNTER — Encounter (HOSPITAL_COMMUNITY): Payer: PPO

## 2019-03-19 ENCOUNTER — Other Ambulatory Visit: Payer: Self-pay

## 2019-03-19 ENCOUNTER — Encounter (HOSPITAL_COMMUNITY): Payer: PPO

## 2019-03-19 ENCOUNTER — Ambulatory Visit (HOSPITAL_BASED_OUTPATIENT_CLINIC_OR_DEPARTMENT_OTHER)
Admission: RE | Admit: 2019-03-19 | Discharge: 2019-03-19 | Disposition: A | Discharge status: Home / Self Care | Payer: PPO | Source: Ambulatory Visit | Attending: Cardiovascular Disease | Admitting: Cardiovascular Disease

## 2019-03-19 ENCOUNTER — Other Ambulatory Visit (HOSPITAL_COMMUNITY): Payer: Self-pay | Admitting: Cardiovascular Disease

## 2019-03-19 ENCOUNTER — Ambulatory Visit (HOSPITAL_COMMUNITY)
Admission: RE | Admit: 2019-03-19 | Discharge: 2019-03-19 | Disposition: A | Discharge status: Home / Self Care | Payer: PPO | Source: Ambulatory Visit | Attending: Cardiovascular Disease | Admitting: Cardiovascular Disease

## 2019-03-19 DIAGNOSIS — R0989 Other specified symptoms and signs involving the circulatory and respiratory systems: Secondary | ICD-10-CM

## 2019-03-19 DIAGNOSIS — I739 Peripheral vascular disease, unspecified: Secondary | ICD-10-CM

## 2019-03-19 DIAGNOSIS — I6523 Occlusion and stenosis of bilateral carotid arteries: Secondary | ICD-10-CM

## 2019-03-19 DIAGNOSIS — I779 Disorder of arteries and arterioles, unspecified: Secondary | ICD-10-CM

## 2019-03-19 NOTE — Progress Notes (Signed)
Notes recorded by Lorretta Harp, MD on 03/19/2019 at 12:09 PM EDT  No change from prior study. Repeat in 12 months. History of PAD

## 2019-03-19 NOTE — Progress Notes (Signed)
Notes recorded by Lorretta Harp, MD on 03/19/2019 at 12:09 PM EDT  No change from prior study. Repeat in 12 months.   PVD  PAD

## 2019-03-19 NOTE — Progress Notes (Signed)
Notes recorded by Lorretta Harp, MD on 03/19/2019 at 12:09 PM EDT  No change from prior study. Repeat in 12 months.   Carotid artery disease follow up.

## 2019-03-25 ENCOUNTER — Telehealth: Payer: Self-pay

## 2019-03-25 NOTE — Telephone Encounter (Signed)
Called to COVID screen patient for her in-office appointment with Dr. Burt Knack 6/24. She states she'd like to cancel the appointment. She was referred to Dr. Gwenlyn Found for her PV issues last summer and would like to only have one cardiologist since she isn't having any issues. She was grateful for assistance. Appointment with Dr. Burt Knack cancelled.

## 2019-03-27 ENCOUNTER — Ambulatory Visit: Payer: Medicare Other | Admitting: Cardiovascular Disease

## 2019-05-22 DIAGNOSIS — E1142 Type 2 diabetes mellitus with diabetic polyneuropathy: Secondary | ICD-10-CM | POA: Diagnosis not present

## 2019-05-22 DIAGNOSIS — F419 Anxiety disorder, unspecified: Secondary | ICD-10-CM | POA: Diagnosis not present

## 2019-06-18 ENCOUNTER — Encounter: Payer: Self-pay | Admitting: Cardiovascular Disease

## 2019-06-18 ENCOUNTER — Telehealth: Payer: Self-pay

## 2019-06-18 ENCOUNTER — Other Ambulatory Visit: Payer: Self-pay

## 2019-06-18 ENCOUNTER — Ambulatory Visit (INDEPENDENT_AMBULATORY_CARE_PROVIDER_SITE_OTHER): Payer: PPO | Admitting: Cardiovascular Disease

## 2019-06-18 DIAGNOSIS — E782 Mixed hyperlipidemia: Secondary | ICD-10-CM | POA: Diagnosis not present

## 2019-06-18 DIAGNOSIS — I447 Left bundle-branch block, unspecified: Secondary | ICD-10-CM | POA: Diagnosis not present

## 2019-06-18 DIAGNOSIS — I10 Essential (primary) hypertension: Secondary | ICD-10-CM | POA: Diagnosis not present

## 2019-06-18 DIAGNOSIS — I739 Peripheral vascular disease, unspecified: Secondary | ICD-10-CM

## 2019-06-18 LAB — LIPID PANEL
Chol/HDL Ratio: 3.9 ratio (ref 0.0–4.4)
Cholesterol, Total: 230 mg/dL — ABNORMAL HIGH (ref 100–199)
HDL: 59 mg/dL (ref >39)
LDL Chol Calc (NIH): 143 mg/dL — ABNORMAL HIGH (ref 0–99)
Triglycerides: 159 mg/dL — ABNORMAL HIGH (ref 0–149)
VLDL Cholesterol Cal: 28 mg/dL (ref 5–40)

## 2019-06-18 NOTE — Assessment & Plan Note (Signed)
Chronic. 

## 2019-06-18 NOTE — Assessment & Plan Note (Signed)
History of hyperlipidemia intolerant to statin therapy.  She may be a candidate for Repatha. 

## 2019-06-18 NOTE — Telephone Encounter (Signed)
Received the following Epic secure chat message from [facility]:   Stevenson Clinch, Dr Gwenlyn Found saw Doris Carr this morning and her husband came with her. He has lost a hearing aid and was wondering if it had been found. I am leaving for the rest of the day and wondered if you could check and see if it was found or if you found it and let him know if it was. His number is [phone]. Thank you  Unable to locate hearing aid. Will update pt husband

## 2019-06-18 NOTE — Patient Instructions (Addendum)
Medication Instructions:  Your physician recommends that you continue on your current medications as directed. Please refer to the Current Medication list given to you today.  If you need a refill on your cardiac medications before your next appointment, please call your pharmacy.   Lab work: Our clinical pharmacist recommends that you have lipid profile blood work collected today.  If you have labs (blood work) drawn today and your tests are completely normal, you will receive your results only by: Marland Kitchen MyChart Message (if you have MyChart) OR . A paper copy in the mail If you have any lab test that is abnormal or we need to change your treatment, we will call you to review the results.  Testing/Procedures: NONE  Follow-Up: At [facility], you and your health needs are our priority.  As part of our continuing mission to provide you with exceptional heart care, we have created designated Provider Care Teams.  These Care Teams include your primary Cardiologist (physician) and Advanced Practice Providers (APPs -  Physician Assistants and Nurse Practitioners) who all work together to provide you with the care you need, when you need it. You will need a follow up appointment in 12 months with Dr. Quay Burow.  Please call our office 2 months in advance to schedule this/each appointment.

## 2019-06-18 NOTE — Assessment & Plan Note (Addendum)
History of peripheral arterial disease by duplex ultrasound last performed 03/19/2019 revealing a right ABI 0.81 and a left 2.84.  She did have a mildly elevated velocity signal in the left common iliac artery although she really denies discomfort in her lower extremities.  She has had a prior right common iliac and left SFA stent placed by Dr. Burt Knack in 11/06/2011 which have remained patent.

## 2019-06-18 NOTE — Progress Notes (Signed)
06/18/2019 Doris Carr   1936-05-23  [id number]  Primary Physician Lawerance Cruel, MD Primary Cardiologist: Lorretta Harp MD [name], [place]  HPI:  Doris Carr is a 83 y.o.  mildly overweight married Caucasian female mother of 64, grandmother of 4 grandchildren by her husband Fraser Din today. She was referred by Dr. Burt Knack for peripheral vascular evaluation because of known PAD with an abnormal Doppler study.   I last saw her in the office 11/14/2018.  She does have a history of treated hypertension, hyper lipidemia has diabetes as well. She is never had a heart attack or stroke. She had a normal cath in 2006. She is statin intolerant. She had a right common iliac artery stent and a left SFA stent performed Dr. Burt Knack 2/13. Recent Dopplers suggest right common iliac artery stenosis which is high-grade although she is completely asymptomatic from this. She has had a right total knee replacement by Dr. Gladstone Lighter is currently walking with a walker mostly around the house she also complains of some instability of gait. Since I saw her 6 months ago she is currently walking with a walker.    She denies pain in her lower extremities as well as denies chest pain or shortness of breath.     Current Meds  Medication Sig  . acetaminophen (TYLENOL) 650 MG CR tablet Take 650 mg by mouth 2 (two) times daily as needed (for arthritis pain.).  Marland Kitchen ALPRAZolam (XANAX) 1 MG tablet Take 1-2 tabs 30 mins prior to MRI.  May take one addt'l tab before entering scanner, if needed.  MUST HAVE DRIVER.  Marland Kitchen amLODipine (NORVASC) 5 MG tablet Take 5 mg by mouth 2 (two) times daily.  . carvedilol (COREG) 25 MG tablet Take 25 mg by mouth 2 (two) times daily.  . chlorthalidone (HYGROTON) 25 MG tablet Take 12.5 mg by mouth daily.  . citalopram (CELEXA) 20 MG tablet Take 20 mg by mouth daily.    . clonazePAM (KLONOPIN) 0.5 MG tablet Take 0.25-0.5 mg by mouth 2 (two) times daily. 0.25 mg in the morning, 0.5 mg in  the evening  . clopidogrel (PLAVIX) 75 MG tablet Take 1 tablet (75 mg total) by mouth daily.  . Cyanocobalamin (VITAMIN B-12 PO) Take 1 tablet by mouth daily.  Marland Kitchen estradiol (ESTRACE) 0.5 MG tablet Take 1 tablet (0.5 mg total) by mouth daily.  Marland Kitchen losartan (COZAAR) 50 MG tablet TAKE 1 TABLET BY MOUTH AT BEDTIME  . metFORMIN (GLUCOPHAGE) 500 MG tablet Take 500 mg by mouth at bedtime.  . traZODone (DESYREL) 50 MG tablet Take 50 mg by mouth at bedtime. HALF A PILL AT BEDTIME     Allergies  Allergen Reactions  . Ciprofloxacin Nausea And Vomiting  . Codeine Nausea And Vomiting  . Darvocet [Propoxyphene N-Acetaminophen] Nausea Only  . Darvon Nausea Only  . Hydralazine Other (See Comments)    Unsure of reaction  . Hydrocodone Nausea And Vomiting  . Morphine And Related Nausea And Vomiting  . Pentazocine Lactate Nausea And Vomiting  . Potassium-Containing Compounds Nausea Only  . Procaine Hcl Nausea And Vomiting  . Sulfa Drugs Cross Reactors Rash    Social History   Socioeconomic History  . Marital status: Married    Spouse name: Not on file  . Number of children: 3  . Years of education: college  . Highest education level: Master's degree (e.g., MA, MS, MEng, MEd, MSW, MBA)  Occupational History  . Occupation: Retired  Science writer  Needs  . Financial resource strain: Not on file  . Food insecurity    Worry: Not on file    Inability: Not on file  . Transportation needs    Medical: Not on file    Non-medical: Not on file  Tobacco Use  . Smoking status: Former Smoker    Packs/day: 0.80    Years: 15.00    Pack years: 12.00    Types: Cigarettes    Quit date: 11/03/1984    Years since quitting: 34.6  . Smokeless tobacco: Never Used  Substance and Sexual Activity  . Alcohol use: No    Alcohol/week: 0.0 standard drinks  . Drug use: No  . Sexual activity: Yes    Birth control/protection: Surgical, Post-menopausal    Comment: 1st intercourse 61 yo-1 partner  Lifestyle  . Physical  activity    Days per week: Not on file    Minutes per session: Not on file  . Stress: Not on file  Relationships  . Social Herbalist on phone: Not on file    Gets together: Not on file    Attends religious service: Not on file    Active member of club or organization: Not on file    Attends meetings of clubs or organizations: Not on file    Relationship status: Not on file  . Intimate partner violence    Fear of current or ex partner: Not on file    Emotionally abused: Not on file    Physically abused: Not on file    Forced sexual activity: Not on file  Other Topics Concern  . Not on file  Social History Narrative   Lives at home with her husband.   Caffeine use: 1 cup per day.   Right-handed.     Review of Systems: General: negative for chills, fever, night sweats or weight changes.  Cardiovascular: negative for chest pain, dyspnea on exertion, edema, orthopnea, palpitations, paroxysmal nocturnal dyspnea or shortness of breath Dermatological: negative for rash Respiratory: negative for cough or wheezing Urologic: negative for hematuria Abdominal: negative for nausea, vomiting, diarrhea, bright red blood per rectum, melena, or hematemesis Neurologic: negative for visual changes, syncope, or dizziness All other systems reviewed and are otherwise negative except as noted above.    Blood pressure (!) 102/54, pulse 75, temperature (!) 97 F (36.1 C), height 5\' 4"  (1.626 m), weight 160 lb (72.6 kg).  General appearance: alert and no distress Neck: no adenopathy, no carotid bruit, no JVD, supple, symmetrical, trachea midline and thyroid not enlarged, symmetric, no tenderness/mass/nodules Lungs: clear to auscultation bilaterally Heart: regular rate and rhythm, S1, S2 normal, no murmur, click, rub or gallop Extremities: extremities normal, atraumatic, no cyanosis or edema Pulses: 2+ and symmetric Skin: Skin color, texture, turgor normal. No rashes or lesions  Neurologic: Alert and oriented X 3, normal strength and tone. Normal symmetric reflexes. Normal coordination and gait  EKG sinus rhythm at 75 with left bundle branch block.  I personally reviewed this EKG.  ASSESSMENT AND PLAN:   Left bundle branch block Chronic  HTN (hypertension) History of essential hypertension with blood pressure measured today 102/54.  She is on amlodipine, chlorthalidone and losartan.  PVD (peripheral vascular disease) ([place]) History of peripheral arterial disease by duplex ultrasound last performed 03/19/2019 revealing a right ABI 0.81 and a left 2.84.  She did have a mildly elevated velocity signal in the left common iliac artery although she really denies discomfort in her lower extremities.  She has had a prior right common iliac and left SFA stent placed by Dr. Burt Knack in 11/06/2011 which have remained patent.  Hyperlipidemia History of hyperlipidemia intolerant to statin therapy.  She may be a candidate for Repatha      Lorretta Harp MD [facility], [facility] 06/18/2019 10:49 AM

## 2019-06-18 NOTE — Assessment & Plan Note (Signed)
History of essential hypertension with blood pressure measured today 102/54.  She is on amlodipine, chlorthalidone and losartan.

## 2019-06-21 NOTE — Telephone Encounter (Signed)
contacted pt who states she found husband's hearing aid

## 2019-06-24 ENCOUNTER — Telehealth: Payer: Self-pay

## 2019-06-24 MED ORDER — REPATHA SURECLICK 140 MG/ML ~~LOC~~ SOAJ: 140.0000 mg | SUBCUTANEOUS | 11 refills | Status: DC

## 2019-06-24 NOTE — Telephone Encounter (Signed)
Called and spoke to the pt regarding the repatha pa approval and the rx was sent instructed the pt to call us back if it is unaffordable

## 2019-07-02 ENCOUNTER — Encounter: Payer: PPO | Admitting: Gynecology

## 2019-07-11 ENCOUNTER — Encounter: Payer: Self-pay | Admitting: Gynecology

## 2019-07-12 ENCOUNTER — Encounter: Payer: Self-pay | Admitting: Gynecology

## 2019-07-12 ENCOUNTER — Other Ambulatory Visit: Payer: Self-pay

## 2019-07-12 ENCOUNTER — Ambulatory Visit (INDEPENDENT_AMBULATORY_CARE_PROVIDER_SITE_OTHER): Payer: PPO | Admitting: Gynecology

## 2019-07-12 VITALS — BP 124/82 | Ht 64.0 in | Wt 153.0 lb

## 2019-07-12 DIAGNOSIS — N952 Postmenopausal atrophic vaginitis: Secondary | ICD-10-CM | POA: Diagnosis not present

## 2019-07-12 DIAGNOSIS — N816 Rectocele: Secondary | ICD-10-CM | POA: Diagnosis not present

## 2019-07-12 DIAGNOSIS — Z79890 Hormone replacement therapy (postmenopausal): Secondary | ICD-10-CM | POA: Diagnosis not present

## 2019-07-12 DIAGNOSIS — Z01419 Encounter for gynecological examination (general) (routine) without abnormal findings: Secondary | ICD-10-CM

## 2019-07-12 MED ORDER — ESTRADIOL 0.5 MG PO TABS: 0.5000 mg | ORAL_TABLET | Freq: Every day | ORAL | 4 refills | Status: DC

## 2019-07-12 NOTE — Progress Notes (Signed)
    Doris Carr 1935-12-15 [id number]        83 y.o.  [id number] for breast and pelvic exam.  Without gynecologic complaints.  Past medical history,surgical history, problem list, medications, allergies, family history and social history were all reviewed and documented as reviewed in the EPIC chart.  ROS:  Performed with pertinent positives and negatives included in the history, assessment and plan.   Additional significant findings : None   Exam: [name] assistant Vitals:   07/12/19 1400  BP: 124/82  Weight: 153 lb (69.4 kg)  Height: 5\' 4"  (1.626 m)   Body mass index is 26.26 kg/m.  General appearance:  Normal affect, orientation and appearance. Skin: Grossly normal HEENT: Without gross lesions.  No cervical or supraclavicular adenopathy. Thyroid normal.  Lungs:  Clear without wheezing, rales or rhonchi Cardiac: RR, without RMG Abdominal:  Soft, nontender, without masses, guarding, rebound, organomegaly or hernia Breasts:  Examined lying and sitting without masses, retractions, discharge or axillary adenopathy. Pelvic:  Ext, BUS, Vagina: With atrophic changes.  Second-degree rectocele  Adnexa: Without masses or tenderness    Anus and perineum: Normal   Rectovaginal: Normal sphincter tone without palpated masses or tenderness.    Assessment/Plan:  83 y.o. [id number] female for breast and pelvic exam.  1. Postmenopausal/atrophic genital changes/HRT.  Status post TVH A&P repair in the past.  On estradiol 0.5 mg daily.  We again discussed the risks of HRT to include increased risk of thrombosis such as stroke heart attack DVT in the breast cancer issue.  Risk with advancing age also discussed.  At this point the patient feels very strongly is a quality life issue and would like to continue.  Refill x1 year provided. 2. Rectocele overall stable on serial exams.  Asymptomatic to the patient. 3. Mammography coming due in November and patient will follow-up for this.  Breast exam  normal today. 4. DEXA 2018 normal.  Plan repeat DEXA at 5-year interval. 5. Pap smear 2011.  No Pap smear done today.  No history of significant abnormal Pap smears.  We both agree to stop screening per current screening guidelines based on hysterectomy and age. 6. Sigmoidoscopy 2014.  Colon screening recommendations per primary provider. 7. Health maintenance.  No routine lab work done as patient does this elsewhere.  Follow-up 1 year, sooner as needed.   Anastasio Auerbach MD, 2:49 PM 07/12/2019

## 2019-07-12 NOTE — Patient Instructions (Signed)
Follow-up for your mammogram in November/December.  Follow-up in 1 year for annual exam

## 2019-07-22 ENCOUNTER — Other Ambulatory Visit: Payer: Self-pay

## 2019-07-22 ENCOUNTER — Encounter: Payer: Self-pay | Admitting: Orthopaedic Surgery

## 2019-07-22 ENCOUNTER — Ambulatory Visit (INDEPENDENT_AMBULATORY_CARE_PROVIDER_SITE_OTHER): Payer: PPO

## 2019-07-22 ENCOUNTER — Ambulatory Visit (INDEPENDENT_AMBULATORY_CARE_PROVIDER_SITE_OTHER): Payer: PPO | Admitting: Orthopaedic Surgery

## 2019-07-22 VITALS — Ht 64.0 in | Wt 153.0 lb

## 2019-07-22 DIAGNOSIS — G8929 Other chronic pain: Secondary | ICD-10-CM

## 2019-07-22 DIAGNOSIS — M25561 Pain in right knee: Secondary | ICD-10-CM | POA: Diagnosis not present

## 2019-07-22 DIAGNOSIS — R269 Unspecified abnormalities of gait and mobility: Secondary | ICD-10-CM | POA: Diagnosis not present

## 2019-07-22 DIAGNOSIS — Z96651 Presence of right artificial knee joint: Secondary | ICD-10-CM | POA: Diagnosis not present

## 2019-07-22 NOTE — Progress Notes (Signed)
Office Visit Note   Patient: Doris Carr           Date of Birth: [birth year]           MRN: [id number] Visit Date: 07/22/2019              Requested by: Lawerance Cruel, [place],  [place] [address] PCP: Lawerance Cruel, MD   Assessment & Plan: Visit Diagnoses:  1. Chronic pain of right knee   2. History of total right knee replacement   3. Gait abnormality     Plan: The MRI of her cervical spine did show some degenerative changes in her cervical spine and the MRI of her brain showed significant atrophy.  Certainly any of these types of things can cause a gait disturbance but I feel that her neuropathy does play into this as well.  She did not have any follow-up with a neurologist after her MRI of her brain and cervical spine because this is when the coronavirus pandemic shutdown a lot of offices.  According to their notes, they had recommended bilateral lower extremity nerve conduction/EMG studies to assess lower anything else that could be contributing to her weakness and gait abnormality.  Certainly this is worthwhile obtaining studies such as this to see if that contributes anything to a diagnosis.  I do feel that she should continue using her walker.  We will see if Dr. Ernestina Patches can perform the studies and then once he has the studies get her a follow-up appointment with [place] several days later.  All question concerns were answered and addressed.  Follow-Up Instructions: Follow-up should be established after she has her EMG-NCVS performed  Orders:  Orders Placed This Encounter  Procedures   XR Knee 1-2 Views Right   No orders of the defined types were placed in this encounter.     Procedures: No procedures performed   Clinical Data: No additional findings.   Subjective: Chief Complaint  Patient presents with   Right Knee - Pain  The patient is a very pleasant 83 year old female who comes in for evaluation treatment of bilateral lower extremity  weakness and what she feels is a gait disturbance or abnormality.  She has a history of a right total knee arthroplasty that was performed about 2 to 3 years ago by one of my colleagues in town.  She does report that she has had multiple falls over the last 3 years and it sounds like she has had extensive work-up with neurology as well as with her orthopedic surgeon.  She has been through a significant amount of physical therapy to work on conditioning overall and strengthening of her lower extremities.  She is seeking a second opinion just to get an idea of whether or not there is something that is being missed or if this is "in her head".  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  She denies any memory issues at all.  Her biggest concern is the fact that she is still a significant fall risk.  She ambulates mainly using a walker.  She is concerned about all the falls that she has had and whether or not there is anything that can be done about this.  I spent a significant time reviewing her chart in great detail.  I was able to review the studies that have been done which include a MRI of her brain and  a MRI of her cervical spine.  She has had previous lumbar spine studies as well.  I reviewed the neurologist notes.  She denies any significant issues with her right total knee arthroplasty but states that it does hurt from time to time.  Sitting here in the office today she says that it does not hurt.  She has had no other acute change in her medical status.  She is frustrated that she feels like she is of sound mind but her body is not cooperating.  She is a diabetic and does report a long history of neuropathy.  She does report good blood glucose control.  HPI  Review of Systems She currently denies any headache, chest pain, shortness of breath, fever, chills, nausea, vomiting  Objective: Vital Signs: Ht 5\' 4"  (1.626 m)    Wt 153 lb (69.4 kg)    BMI  26.26 kg/m   Physical Exam She is alert and orient x3 and in no acute distress.  She ambulates using a rolling walker.  She has a slow gait and leans over her walker as she walks. Ortho Exam On examination with her sitting in the chair I first examined her right total knee arthroplasty.  Her incisions well-healed.  There is no knee joint effusion.  Her knee feels ligamentously stable on exam.  I then examined both lower extremities together in terms of isolating muscle groups as much as I could and she has got really good strength in her bilateral lower extremities.  She does have subjective decreased sensation in both feet.  Both hips move smoothly.  Both knees feel ligamentously stable.  Both ankles feel ligamentously stable. Specialty Comments:  No specialty comments available.  Imaging: Xr Knee 1-2 Views Right  Result Date: 07/22/2019 2 views of the right knee show well-seated total knee arthroplasty with no complicating features.  There is no effusion or evidence of loosening of the components or acute changes to the bone around the knee.    PMFS History: Patient Active Problem List   Diagnosis Date Noted   Gait abnormality 11/20/2018   Weakness 11/20/2018   Right carotid bruit 04/17/2018   History of total knee arthroplasty, right 11/16/2016   Pre-operative cardiovascular examination 02/03/2016   Normal coronary arteries  02/03/2016   Fecal incontinence 05/14/2013   PVD (peripheral vascular disease) ([place]) 09/14/2011   Hyperlipidemia [id number]   Diastolic CHF, chronic ([place]) 06/12/2011   Leg pain 05/14/2011   HTN (hypertension) 01/10/2011   Balance problems    Diabetes mellitus with nephropathy (HCC)    Syncope and collapse    Left bundle branch block    Chronic renal insufficiency, stage III (moderate)    Lower extremity edema    Depression    Anxiety    Obesity    Past Medical History:  Diagnosis Date   Anxiety    Arthritis     osteoarthritis   Back pain    sees a neurosurgeon   Balance problems    Balance problems    Chronic diastolic heart failure ([place])    Echo (8/12) with EF 60-65%, moderate LV hypertrophy, mild LVH   Chronic renal insufficiency, stage III (moderate)    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 She has had renal artery doppler evaluation on two different occasions, both times with no evidence for renal artery stenosis. She has had urinary catecholeamine collection that was unremarkable. Edema with 10 mg amlodipine.    Left bundle branch block    Leg pain 05/14/2011   Lower extremity edema    CHRONIC   Lung nodule    VATS and biopsy in 2006 that was negative for malignancy   Obesity    Peripheral vascular disease ([place])    Lower extremity evaluation (12/12): ABI 0.79 right, 0.64 left; > 50% mid left SFA stenosis;  s/p L SFA stent and stent to R CIA 11/2011; ABI's post PTA - R 0.90, L 0.96 (TBI on R 0.51, L 0.61)   Pneumonia    multiple times in childhood   PONV (postoperative nausea and vomiting)    Pre-operative cardiovascular examination 02/03/2016   S/P cardiac catheterization    Left heart cath in 6/06 with normal coronaries, EF 60%.   Syncope and collapse     Family History  Problem Relation Age of Onset   Alzheimer's disease Mother    Hypertension Mother    Heart disease Mother    Alzheimer's disease Father    Diabetes Paternal Grandfather      Past Surgical History:  Procedure Laterality Date   ABDOMINAL AORTAGRAM N/A 11/09/2011   Procedure: ABDOMINAL Maxcine Ham;  Surgeon: Sherren Mocha, MD;  Location: [facility] CATH LAB;  Service: Cardiovascular;  Laterality: N/A;   abdominal aortogram  11/09/2011   ANAL RECTAL MANOMETRY N/A 05/08/2013   Procedure: ANAL RECTAL MANOMETRY;  Surgeon: Leighton Ruff, MD;  Location: [facility] ENDOSCOPY;  Service: Endoscopy;  Laterality: N/A;   CARDIAC CATHETERIZATION  2006   NORMAL CORONARIES   CATARACT EXTRACTION     CERVICAL CONE BIOPSY     FLEXIBLE SIGMOIDOSCOPY N/A 05/08/2013   Procedure: FLEXIBLE SIGMOIDOSCOPY;  Surgeon: Leighton Ruff, MD;  Location: [facility] ENDOSCOPY;  Service: Endoscopy;  Laterality: N/A;  rigid proctoscope get from o.r.   GYNECOLOGIC CRYOSURGERY     Knee Surg     Arthroscopic   RECTAL ULTRASOUND N/A 05/08/2013   Procedure: RECTAL ULTRASOUND;  Surgeon: Leighton Ruff, MD;  Location: [facility] ENDOSCOPY;  Service: Endoscopy;  Laterality: N/A;   Stints in leg arteries     THORACOTOMY     THYMIC CYST REMOVAL     TONSILLECTOMY     TOTAL KNEE ARTHROPLASTY Right 11/16/2016   Procedure: RIGHT TOTAL KNEE ARTHROPLASTY;  Surgeon: Latanya Maudlin, MD;  Location: [facility] ORS;  Service: Orthopedics;  Laterality: Right;  block   VAGINAL HYSTERECTOMY  1985   WITH A/P REPAIR   Social History   Occupational History   Occupation: Retired  Tobacco Use   Smoking status: Former Smoker    Packs/day: 0.80    Years: 15.00    Pack years: 12.00    Types: Cigarettes    Quit date: 11/03/1984    Years since quitting: 34.7   Smokeless tobacco: Never Used  Substance and Sexual Activity   Alcohol use: No    Alcohol/week: 0.0 standard drinks   Drug use: No   Sexual activity: Yes  Birth control/protection: Surgical, Post-menopausal    Comment: 1st intercourse 62 yo-1 partner

## 2019-07-23 ENCOUNTER — Other Ambulatory Visit: Payer: Self-pay

## 2019-07-23 DIAGNOSIS — M79605 Pain in left leg: Secondary | ICD-10-CM

## 2019-07-23 DIAGNOSIS — M79604 Pain in right leg: Secondary | ICD-10-CM

## 2019-07-24 ENCOUNTER — Other Ambulatory Visit: Payer: Self-pay

## 2019-07-24 DIAGNOSIS — M79604 Pain in right leg: Secondary | ICD-10-CM

## 2019-08-05 ENCOUNTER — Other Ambulatory Visit: Payer: Self-pay | Admitting: Cardiovascular Disease

## 2019-08-05 ENCOUNTER — Telehealth: Payer: Self-pay | Admitting: *Deleted

## 2019-08-05 MED ORDER — REPATHA SURECLICK 140 MG/ML ~~LOC~~ SOAJ: 140.0000 mg | SUBCUTANEOUS | 11 refills | Status: DC

## 2019-08-05 MED ORDER — ESTRADIOL 0.5 MG PO TABS: 0.5000 mg | ORAL_TABLET | Freq: Every day | ORAL | 4 refills | Status: DC

## 2019-08-05 MED ORDER — CARVEDILOL 25 MG PO TABS: 25.0000 mg | ORAL_TABLET | Freq: Two times a day (BID) | ORAL | 3 refills | Status: DC

## 2019-08-05 MED ORDER — LOSARTAN POTASSIUM 50 MG PO TABS: 50.0000 mg | ORAL_TABLET | Freq: Every day | ORAL | 3 refills | Status: DC

## 2019-08-05 NOTE — Telephone Encounter (Signed)
° ° ° ° °*  STAT* If patient is at the pharmacy, call can be transferred to refill team.   1. Which medications need to be refilled? (please list name of each medication and dose if known)  carvedilol (COREG) 25 MG tablet losartan (COZAAR) 50 MG tablet REPATHA clopidogrel (PLAVIX) 75 MG tablet   2. Which pharmacy/location (including street and city if local pharmacy) is medication to be sent to? [facility]  3. Do they need a 30 day or 90 day supply? [place]

## 2019-08-05 NOTE — Telephone Encounter (Signed)
[facility] pharmacy sent a fax stating patient will now be using them as her current pharmacy, requesting estradiol 0.5 mg tablet sent.

## 2019-08-07 DIAGNOSIS — M25473 Effusion, unspecified ankle: Secondary | ICD-10-CM | POA: Diagnosis not present

## 2019-08-09 ENCOUNTER — Other Ambulatory Visit: Payer: Self-pay | Admitting: Pharmacist Clinician (PhC)/ Clinical Pharmacy Specialist

## 2019-08-09 ENCOUNTER — Other Ambulatory Visit: Payer: Self-pay | Admitting: *Deleted

## 2019-08-09 MED ORDER — CHLORTHALIDONE 25 MG PO TABS: 12.5000 mg | ORAL_TABLET | Freq: Every day | ORAL | 3 refills | Status: DC

## 2019-08-09 MED ORDER — REPATHA SURECLICK 140 MG/ML ~~LOC~~ SOAJ: 140.0000 mg | SUBCUTANEOUS | 4 refills | Status: DC

## 2019-08-16 ENCOUNTER — Encounter: Payer: Self-pay | Admitting: Gynecology

## 2019-08-16 DIAGNOSIS — Z1231 Encounter for screening mammogram for malignant neoplasm of breast: Secondary | ICD-10-CM | POA: Diagnosis not present

## 2019-08-26 ENCOUNTER — Other Ambulatory Visit: Payer: Self-pay

## 2019-08-26 MED ORDER — REPATHA SURECLICK 140 MG/ML ~~LOC~~ SOAJ: 140.0000 mg | SUBCUTANEOUS | 4 refills | Status: DC

## 2019-08-27 ENCOUNTER — Telehealth: Payer: Self-pay | Admitting: Cardiovascular Disease

## 2019-08-27 NOTE — Telephone Encounter (Signed)
Edwin Dada from [place] [facility] is calling in regards to a prior authorization for [place].

## 2019-08-27 NOTE — Telephone Encounter (Signed)
Pa submitted awaiting answer

## 2019-09-02 DIAGNOSIS — M1990 Unspecified osteoarthritis, unspecified site: Secondary | ICD-10-CM | POA: Diagnosis not present

## 2019-09-02 DIAGNOSIS — N183 Chronic kidney disease, stage 3 unspecified: Secondary | ICD-10-CM | POA: Diagnosis not present

## 2019-09-02 DIAGNOSIS — I10 Essential (primary) hypertension: Secondary | ICD-10-CM | POA: Diagnosis not present

## 2019-09-02 DIAGNOSIS — E1142 Type 2 diabetes mellitus with diabetic polyneuropathy: Secondary | ICD-10-CM | POA: Diagnosis not present

## 2019-09-02 DIAGNOSIS — E7800 Pure hypercholesterolemia, unspecified: Secondary | ICD-10-CM | POA: Diagnosis not present

## 2019-09-02 DIAGNOSIS — M19041 Primary osteoarthritis, right hand: Secondary | ICD-10-CM | POA: Diagnosis not present

## 2019-09-04 DIAGNOSIS — G629 Polyneuropathy, unspecified: Secondary | ICD-10-CM | POA: Diagnosis not present

## 2019-10-03 DIAGNOSIS — E7800 Pure hypercholesterolemia, unspecified: Secondary | ICD-10-CM | POA: Diagnosis not present

## 2019-10-03 DIAGNOSIS — I10 Essential (primary) hypertension: Secondary | ICD-10-CM | POA: Diagnosis not present

## 2019-10-03 DIAGNOSIS — E1142 Type 2 diabetes mellitus with diabetic polyneuropathy: Secondary | ICD-10-CM | POA: Diagnosis not present

## 2019-10-03 DIAGNOSIS — M1990 Unspecified osteoarthritis, unspecified site: Secondary | ICD-10-CM | POA: Diagnosis not present

## 2019-10-03 DIAGNOSIS — M19041 Primary osteoarthritis, right hand: Secondary | ICD-10-CM | POA: Diagnosis not present

## 2019-10-03 DIAGNOSIS — N183 Chronic kidney disease, stage 3 unspecified: Secondary | ICD-10-CM | POA: Diagnosis not present

## 2019-10-12 ENCOUNTER — Ambulatory Visit: Payer: Medicare Other | Attending: Internal Medicine

## 2019-10-12 DIAGNOSIS — Z23 Encounter for immunization: Secondary | ICD-10-CM | POA: Insufficient documentation

## 2019-10-12 NOTE — Progress Notes (Signed)
   Covid-19 Vaccination Clinic  Name:  BENTLEI WIATR    MRN: [id number] DOB: [birth year]  10/12/2019  Ms. Lassila was observed post Covid-19 immunization for 30 minutes based on pre-vaccination screening without incidence. She was provided with Vaccine Information Sheet and instruction to access the V-Safe system.   Ms. Messamore was instructed to call 911 with any severe reactions post vaccine: Marland Kitchen Difficulty breathing  . Swelling of your face and throat  . A fast heartbeat  . A bad rash all over your body  . Dizziness and weakness    Immunizations Administered    Name Date Dose VIS Date Route   Pfizer COVID-19 Vaccine 10/12/2019  2:13 PM 0.3 mL 09/13/2019 Intramuscular   Manufacturer: [place]   Lot: [id number]   [place]: [id number]

## 2019-10-18 DIAGNOSIS — N183 Chronic kidney disease, stage 3 unspecified: Secondary | ICD-10-CM | POA: Diagnosis not present

## 2019-10-18 DIAGNOSIS — E7800 Pure hypercholesterolemia, unspecified: Secondary | ICD-10-CM | POA: Diagnosis not present

## 2019-10-18 DIAGNOSIS — M19041 Primary osteoarthritis, right hand: Secondary | ICD-10-CM | POA: Diagnosis not present

## 2019-10-18 DIAGNOSIS — E1142 Type 2 diabetes mellitus with diabetic polyneuropathy: Secondary | ICD-10-CM | POA: Diagnosis not present

## 2019-10-18 DIAGNOSIS — M1990 Unspecified osteoarthritis, unspecified site: Secondary | ICD-10-CM | POA: Diagnosis not present

## 2019-10-18 DIAGNOSIS — I10 Essential (primary) hypertension: Secondary | ICD-10-CM | POA: Diagnosis not present

## 2019-10-31 ENCOUNTER — Ambulatory Visit: Payer: PPO

## 2019-11-02 ENCOUNTER — Ambulatory Visit: Payer: PPO | Attending: Internal Medicine

## 2019-11-02 DIAGNOSIS — Z23 Encounter for immunization: Secondary | ICD-10-CM | POA: Insufficient documentation

## 2019-11-02 NOTE — Progress Notes (Signed)
   Covid-19 Vaccination Clinic  Name:  Doris Carr    MRN: [id number] DOB: 1936-03-12  11/02/2019  Doris Carr was observed post Covid-19 immunization for 15 minutes without incidence. She was provided with Vaccine Information Sheet and instruction to access the V-Safe system.   Doris Carr was instructed to call 911 with any severe reactions post vaccine: Marland Kitchen Difficulty breathing  . Swelling of your face and throat  . A fast heartbeat  . A bad rash all over your body  . Dizziness and weakness    Immunizations Administered    Name Date Dose VIS Date Route   Pfizer COVID-19 Vaccine 11/02/2019 10:35 AM 0.3 mL 09/13/2019 Intramuscular   Manufacturer: [place]   Lot: [id number]   [place]: [id number]

## 2019-11-15 DIAGNOSIS — E1142 Type 2 diabetes mellitus with diabetic polyneuropathy: Secondary | ICD-10-CM | POA: Diagnosis not present

## 2019-11-15 DIAGNOSIS — M19041 Primary osteoarthritis, right hand: Secondary | ICD-10-CM | POA: Diagnosis not present

## 2019-11-15 DIAGNOSIS — E7800 Pure hypercholesterolemia, unspecified: Secondary | ICD-10-CM | POA: Diagnosis not present

## 2019-11-15 DIAGNOSIS — I10 Essential (primary) hypertension: Secondary | ICD-10-CM | POA: Diagnosis not present

## 2019-12-03 DIAGNOSIS — I10 Essential (primary) hypertension: Secondary | ICD-10-CM | POA: Diagnosis not present

## 2019-12-03 DIAGNOSIS — M1990 Unspecified osteoarthritis, unspecified site: Secondary | ICD-10-CM | POA: Diagnosis not present

## 2019-12-03 DIAGNOSIS — N183 Chronic kidney disease, stage 3 unspecified: Secondary | ICD-10-CM | POA: Diagnosis not present

## 2019-12-03 DIAGNOSIS — E1142 Type 2 diabetes mellitus with diabetic polyneuropathy: Secondary | ICD-10-CM | POA: Diagnosis not present

## 2019-12-03 DIAGNOSIS — G629 Polyneuropathy, unspecified: Secondary | ICD-10-CM | POA: Diagnosis not present

## 2019-12-03 DIAGNOSIS — F419 Anxiety disorder, unspecified: Secondary | ICD-10-CM | POA: Diagnosis not present

## 2019-12-03 DIAGNOSIS — I739 Peripheral vascular disease, unspecified: Secondary | ICD-10-CM | POA: Diagnosis not present

## 2019-12-03 DIAGNOSIS — E7800 Pure hypercholesterolemia, unspecified: Secondary | ICD-10-CM | POA: Diagnosis not present

## 2019-12-03 DIAGNOSIS — Z0000 Encounter for general adult medical examination without abnormal findings: Secondary | ICD-10-CM | POA: Diagnosis not present

## 2019-12-10 ENCOUNTER — Other Ambulatory Visit: Payer: Self-pay

## 2019-12-10 ENCOUNTER — Emergency Department (HOSPITAL_COMMUNITY): Payer: PPO

## 2019-12-10 ENCOUNTER — Encounter (HOSPITAL_COMMUNITY): Payer: Self-pay

## 2019-12-10 ENCOUNTER — Inpatient Hospital Stay (HOSPITAL_COMMUNITY)
Admission: EM | Admit: 2019-12-10 | Discharge: 2019-12-14 | DRG: 038 | Disposition: A | Discharge status: Home / Self Care | Payer: PPO | Attending: Internal Medicine | Admitting: Internal Medicine

## 2019-12-10 DIAGNOSIS — F329 Major depressive disorder, single episode, unspecified: Secondary | ICD-10-CM | POA: Diagnosis present

## 2019-12-10 DIAGNOSIS — E119 Type 2 diabetes mellitus without complications: Secondary | ICD-10-CM | POA: Diagnosis not present

## 2019-12-10 DIAGNOSIS — F5105 Insomnia due to other mental disorder: Secondary | ICD-10-CM | POA: Diagnosis present

## 2019-12-10 DIAGNOSIS — Z79899 Other long term (current) drug therapy: Secondary | ICD-10-CM

## 2019-12-10 DIAGNOSIS — I447 Left bundle-branch block, unspecified: Secondary | ICD-10-CM | POA: Diagnosis present

## 2019-12-10 DIAGNOSIS — I6381 Other cerebral infarction due to occlusion or stenosis of small artery: Secondary | ICD-10-CM | POA: Diagnosis present

## 2019-12-10 DIAGNOSIS — I6522 Occlusion and stenosis of left carotid artery: Secondary | ICD-10-CM | POA: Diagnosis present

## 2019-12-10 DIAGNOSIS — I672 Cerebral atherosclerosis: Secondary | ICD-10-CM | POA: Diagnosis present

## 2019-12-10 DIAGNOSIS — I6529 Occlusion and stenosis of unspecified carotid artery: Secondary | ICD-10-CM

## 2019-12-10 DIAGNOSIS — Z20822 Contact with and (suspected) exposure to covid-19: Secondary | ICD-10-CM | POA: Diagnosis present

## 2019-12-10 DIAGNOSIS — Z885 Allergy status to narcotic agent status: Secondary | ICD-10-CM

## 2019-12-10 DIAGNOSIS — E669 Obesity, unspecified: Secondary | ICD-10-CM | POA: Diagnosis present

## 2019-12-10 DIAGNOSIS — E1165 Type 2 diabetes mellitus with hyperglycemia: Secondary | ICD-10-CM | POA: Diagnosis present

## 2019-12-10 DIAGNOSIS — Z90710 Acquired absence of both cervix and uterus: Secondary | ICD-10-CM

## 2019-12-10 DIAGNOSIS — E1121 Type 2 diabetes mellitus with diabetic nephropathy: Secondary | ICD-10-CM

## 2019-12-10 DIAGNOSIS — Z96659 Presence of unspecified artificial knee joint: Secondary | ICD-10-CM | POA: Diagnosis present

## 2019-12-10 DIAGNOSIS — E876 Hypokalemia: Secondary | ICD-10-CM | POA: Diagnosis not present

## 2019-12-10 DIAGNOSIS — F419 Anxiety disorder, unspecified: Secondary | ICD-10-CM | POA: Diagnosis present

## 2019-12-10 DIAGNOSIS — N289 Disorder of kidney and ureter, unspecified: Secondary | ICD-10-CM | POA: Diagnosis not present

## 2019-12-10 DIAGNOSIS — N1831 Chronic kidney disease, stage 3a: Secondary | ICD-10-CM | POA: Diagnosis present

## 2019-12-10 DIAGNOSIS — K219 Gastro-esophageal reflux disease without esophagitis: Secondary | ICD-10-CM | POA: Diagnosis present

## 2019-12-10 DIAGNOSIS — Z881 Allergy status to other antibiotic agents status: Secondary | ICD-10-CM

## 2019-12-10 DIAGNOSIS — I639 Cerebral infarction, unspecified: Secondary | ICD-10-CM | POA: Diagnosis present

## 2019-12-10 DIAGNOSIS — E1151 Type 2 diabetes mellitus with diabetic peripheral angiopathy without gangrene: Secondary | ICD-10-CM | POA: Diagnosis present

## 2019-12-10 DIAGNOSIS — R29701 NIHSS score 1: Secondary | ICD-10-CM | POA: Diagnosis present

## 2019-12-10 DIAGNOSIS — H34232 Retinal artery branch occlusion, left eye: Secondary | ICD-10-CM | POA: Diagnosis present

## 2019-12-10 DIAGNOSIS — I130 Hypertensive heart and chronic kidney disease with heart failure and stage 1 through stage 4 chronic kidney disease, or unspecified chronic kidney disease: Secondary | ICD-10-CM | POA: Diagnosis present

## 2019-12-10 DIAGNOSIS — E785 Hyperlipidemia, unspecified: Secondary | ICD-10-CM | POA: Diagnosis present

## 2019-12-10 DIAGNOSIS — Z7902 Long term (current) use of antithrombotics/antiplatelets: Secondary | ICD-10-CM

## 2019-12-10 DIAGNOSIS — I5032 Chronic diastolic (congestive) heart failure: Secondary | ICD-10-CM | POA: Diagnosis present

## 2019-12-10 DIAGNOSIS — H539 Unspecified visual disturbance: Secondary | ICD-10-CM | POA: Diagnosis present

## 2019-12-10 DIAGNOSIS — Z961 Presence of intraocular lens: Secondary | ICD-10-CM | POA: Diagnosis not present

## 2019-12-10 DIAGNOSIS — Z87891 Personal history of nicotine dependence: Secondary | ICD-10-CM

## 2019-12-10 DIAGNOSIS — R339 Retention of urine, unspecified: Secondary | ICD-10-CM | POA: Diagnosis not present

## 2019-12-10 DIAGNOSIS — H5340 Unspecified visual field defects: Secondary | ICD-10-CM | POA: Diagnosis present

## 2019-12-10 DIAGNOSIS — Z882 Allergy status to sulfonamides status: Secondary | ICD-10-CM

## 2019-12-10 DIAGNOSIS — Z888 Allergy status to other drugs, medicaments and biological substances status: Secondary | ICD-10-CM

## 2019-12-10 DIAGNOSIS — I6389 Other cerebral infarction: Secondary | ICD-10-CM | POA: Diagnosis not present

## 2019-12-10 DIAGNOSIS — E1122 Type 2 diabetes mellitus with diabetic chronic kidney disease: Secondary | ICD-10-CM | POA: Diagnosis present

## 2019-12-10 DIAGNOSIS — I6523 Occlusion and stenosis of bilateral carotid arteries: Secondary | ICD-10-CM | POA: Diagnosis not present

## 2019-12-10 DIAGNOSIS — Z86001 Personal history of in-situ neoplasm of cervix uteri: Secondary | ICD-10-CM

## 2019-12-10 DIAGNOSIS — Z820 Family history of epilepsy and other diseases of the nervous system: Secondary | ICD-10-CM

## 2019-12-10 DIAGNOSIS — Z8249 Family history of ischemic heart disease and other diseases of the circulatory system: Secondary | ICD-10-CM

## 2019-12-10 DIAGNOSIS — Z79891 Long term (current) use of opiate analgesic: Secondary | ICD-10-CM

## 2019-12-10 DIAGNOSIS — Z7984 Long term (current) use of oral hypoglycemic drugs: Secondary | ICD-10-CM

## 2019-12-10 DIAGNOSIS — Z833 Family history of diabetes mellitus: Secondary | ICD-10-CM

## 2019-12-10 DIAGNOSIS — I10 Essential (primary) hypertension: Secondary | ICD-10-CM

## 2019-12-10 DIAGNOSIS — H547 Unspecified visual loss: Secondary | ICD-10-CM | POA: Diagnosis not present

## 2019-12-10 DIAGNOSIS — Z6823 Body mass index (BMI) 23.0-23.9, adult: Secondary | ICD-10-CM | POA: Diagnosis not present

## 2019-12-10 DIAGNOSIS — Z03818 Encounter for observation for suspected exposure to other biological agents ruled out: Secondary | ICD-10-CM | POA: Diagnosis not present

## 2019-12-10 LAB — COMPREHENSIVE METABOLIC PANEL
ALT: 11 U/L (ref 0–44)
AST: 19 U/L (ref 15–41)
Albumin: 3.8 g/dL (ref 3.5–5.0)
Alkaline Phosphatase: 43 U/L (ref 38–126)
Anion gap: 15 (ref 5–15)
BUN: 39 mg/dL — ABNORMAL HIGH (ref 8–23)
CO2: 28 mmol/L (ref 22–32)
Calcium: 9.7 mg/dL (ref 8.9–10.3)
Chloride: 96 mmol/L — ABNORMAL LOW (ref 98–111)
Creatinine, Ser: 1.33 mg/dL — ABNORMAL HIGH (ref 0.44–1.00)
GFR calc Af Amer: 43 mL/min — ABNORMAL LOW (ref >60)
GFR calc non Af Amer: 37 mL/min — ABNORMAL LOW (ref >60)
Glucose, Bld: 164 mg/dL — ABNORMAL HIGH (ref 70–99)
Potassium: 3.4 mmol/L — ABNORMAL LOW (ref 3.5–5.1)
Sodium: 139 mmol/L (ref 135–145)
Total Bilirubin: 0.8 mg/dL (ref 0.3–1.2)
Total Protein: 6 g/dL — ABNORMAL LOW (ref 6.5–8.1)

## 2019-12-10 LAB — URINALYSIS, ROUTINE W REFLEX MICROSCOPIC
Bilirubin Urine: NEGATIVE
Glucose, UA: NEGATIVE mg/dL
Hgb urine dipstick: NEGATIVE
Ketones, ur: NEGATIVE mg/dL
Nitrite: NEGATIVE
Protein, ur: NEGATIVE mg/dL
Specific Gravity, Urine: 1.009 (ref 1.005–1.030)
pH: 6 (ref 5.0–8.0)

## 2019-12-10 LAB — CBC
HCT: 38.5 % (ref 36.0–46.0)
Hemoglobin: 13.5 g/dL (ref 12.0–15.0)
MCH: 34 pg (ref 26.0–34.0)
MCHC: 35.1 g/dL (ref 30.0–36.0)
MCV: 97 fL (ref 80.0–100.0)
Platelets: 161 10*3/uL (ref 150–400)
RBC: 3.97 MIL/uL (ref 3.87–5.11)
RDW: 11.1 % — ABNORMAL LOW (ref 11.5–15.5)
WBC: 6.3 10*3/uL (ref 4.0–10.5)
nRBC: 0 % (ref 0.0–0.2)

## 2019-12-10 LAB — DIFFERENTIAL
Abs Immature Granulocytes: 0.01 10*3/uL (ref 0.00–0.07)
Basophils Absolute: 0.1 10*3/uL (ref 0.0–0.1)
Basophils Relative: 1 %
Eosinophils Absolute: 0.5 10*3/uL (ref 0.0–0.5)
Eosinophils Relative: 8 %
Immature Granulocytes: 0 %
Lymphocytes Relative: 20 %
Lymphs Abs: 1.3 10*3/uL (ref 0.7–4.0)
Monocytes Absolute: 0.7 10*3/uL (ref 0.1–1.0)
Monocytes Relative: 11 %
Neutro Abs: 3.8 10*3/uL (ref 1.7–7.7)
Neutrophils Relative %: 60 %

## 2019-12-10 LAB — PROTIME-INR
INR: 0.9 (ref 0.8–1.2)
Prothrombin Time: 12.3 seconds (ref 11.4–15.2)

## 2019-12-10 LAB — C-REACTIVE PROTEIN: CRP: 0.8 mg/dL (ref <1.0)

## 2019-12-10 LAB — CBG MONITORING, ED: Glucose-Capillary: 180 mg/dL — ABNORMAL HIGH (ref 70–99)

## 2019-12-10 LAB — SEDIMENTATION RATE: Sed Rate: 7 mm/hr (ref 0–22)

## 2019-12-10 MED ORDER — ASPIRIN 300 MG RE SUPP
300.0000 mg | Freq: Every day | RECTAL | Status: DC
  Filled 2019-12-10: qty 1

## 2019-12-10 MED ORDER — CLONAZEPAM 0.5 MG PO TABS: 0.2500 mg | ORAL_TABLET | Freq: Two times a day (BID) | ORAL | Status: DC

## 2019-12-10 MED ORDER — ACETAMINOPHEN 325 MG PO TABS
650.0000 mg | ORAL_TABLET | ORAL | Status: DC | PRN
  Administered 2019-12-11 – 2019-12-14 (×3): 650 mg via ORAL
  Filled 2019-12-10 (×3): qty 2

## 2019-12-10 MED ORDER — HYDROCODONE-ACETAMINOPHEN 5-325 MG PO TABS
1.0000 | ORAL_TABLET | Freq: Four times a day (QID) | ORAL | Status: DC | PRN
  Administered 2019-12-10 – 2019-12-11 (×2): 1 via ORAL
  Filled 2019-12-10 (×2): qty 1

## 2019-12-10 MED ORDER — POTASSIUM CHLORIDE CRYS ER 20 MEQ PO TBCR
20.0000 meq | EXTENDED_RELEASE_TABLET | Freq: Once | ORAL | Status: AC
  Administered 2019-12-10: 20 meq via ORAL
  Filled 2019-12-10: qty 1

## 2019-12-10 MED ORDER — AMLODIPINE BESYLATE 5 MG PO TABS
5.0000 mg | ORAL_TABLET | Freq: Two times a day (BID) | ORAL | Status: DC
  Administered 2019-12-11 – 2019-12-14 (×6): 5 mg via ORAL
  Filled 2019-12-10 (×6): qty 1

## 2019-12-10 MED ORDER — SODIUM CHLORIDE 0.9 % IV SOLN: 250.0000 mL | INTRAVENOUS | Status: DC | PRN

## 2019-12-10 MED ORDER — HEPARIN SODIUM (PORCINE) 5000 UNIT/ML IJ SOLN
5000.0000 [IU] | Freq: Three times a day (TID) | INTRAMUSCULAR | Status: DC
  Administered 2019-12-10 – 2019-12-12 (×7): 5000 [IU] via SUBCUTANEOUS
  Filled 2019-12-10 (×7): qty 1

## 2019-12-10 MED ORDER — ATORVASTATIN CALCIUM 80 MG PO TABS
80.0000 mg | ORAL_TABLET | Freq: Every day | ORAL | Status: DC
  Filled 2019-12-10: qty 1

## 2019-12-10 MED ORDER — ESTRADIOL 1 MG PO TABS
0.5000 mg | ORAL_TABLET | Freq: Every day | ORAL | Status: DC
  Administered 2019-12-11 – 2019-12-12 (×2): 0.5 mg via ORAL
  Filled 2019-12-10 (×2): qty 0.5

## 2019-12-10 MED ORDER — CITALOPRAM HYDROBROMIDE 20 MG PO TABS
20.0000 mg | ORAL_TABLET | Freq: Every day | ORAL | Status: DC
  Administered 2019-12-11 – 2019-12-14 (×3): 20 mg via ORAL
  Filled 2019-12-10 (×3): qty 1

## 2019-12-10 MED ORDER — ONDANSETRON HCL 4 MG/2ML IJ SOLN
4.0000 mg | Freq: Four times a day (QID) | INTRAMUSCULAR | Status: DC | PRN
  Administered 2019-12-13: 13:00:00 4 mg via INTRAVENOUS

## 2019-12-10 MED ORDER — CLONAZEPAM 0.25 MG PO TBDP
0.5000 mg | ORAL_TABLET | Freq: Every evening | ORAL | Status: DC
  Administered 2019-12-11 – 2019-12-12 (×2): 0.5 mg via ORAL
  Filled 2019-12-10 (×2): qty 2

## 2019-12-10 MED ORDER — STROKE: EARLY STAGES OF RECOVERY BOOK
Freq: Once | Status: AC
  Filled 2019-12-10: qty 1

## 2019-12-10 MED ORDER — ACETAMINOPHEN 650 MG RE SUPP: 650.0000 mg | RECTAL | Status: DC | PRN

## 2019-12-10 MED ORDER — ASPIRIN 325 MG PO TABS
325.0000 mg | ORAL_TABLET | Freq: Every day | ORAL | Status: DC
  Administered 2019-12-11 – 2019-12-12 (×2): 325 mg via ORAL
  Filled 2019-12-10 (×3): qty 1

## 2019-12-10 MED ORDER — SODIUM CHLORIDE 0.9% FLUSH
3.0000 mL | Freq: Two times a day (BID) | INTRAVENOUS | Status: DC
  Administered 2019-12-11 – 2019-12-14 (×5): 3 mL via INTRAVENOUS

## 2019-12-10 MED ORDER — CLONAZEPAM 0.25 MG PO TBDP
0.2500 mg | ORAL_TABLET | Freq: Every morning | ORAL | Status: DC
  Administered 2019-12-11 – 2019-12-14 (×3): 0.25 mg via ORAL
  Filled 2019-12-10 (×3): qty 1

## 2019-12-10 MED ORDER — SODIUM CHLORIDE 0.9% FLUSH: 3.0000 mL | INTRAVENOUS | Status: DC | PRN

## 2019-12-10 MED ORDER — CARVEDILOL 25 MG PO TABS
25.0000 mg | ORAL_TABLET | Freq: Two times a day (BID) | ORAL | Status: DC
  Administered 2019-12-11 – 2019-12-14 (×7): 25 mg via ORAL
  Filled 2019-12-10 (×7): qty 1

## 2019-12-10 MED ORDER — INSULIN ASPART 100 UNIT/ML ~~LOC~~ SOLN
0.0000 [IU] | Freq: Every day | SUBCUTANEOUS | Status: DC
  Administered 2019-12-13: 2 [IU] via SUBCUTANEOUS

## 2019-12-10 MED ORDER — ACETAMINOPHEN 160 MG/5ML PO SOLN: 650.0000 mg | ORAL | Status: DC | PRN

## 2019-12-10 MED ORDER — TRAZODONE HCL 50 MG PO TABS
50.0000 mg | ORAL_TABLET | Freq: Every day | ORAL | Status: DC
  Administered 2019-12-10 – 2019-12-13 (×4): 50 mg via ORAL
  Filled 2019-12-10 (×4): qty 1

## 2019-12-10 MED ORDER — SENNOSIDES-DOCUSATE SODIUM 8.6-50 MG PO TABS: 1.0000 | ORAL_TABLET | Freq: Every evening | ORAL | Status: DC | PRN

## 2019-12-10 MED ORDER — INSULIN ASPART 100 UNIT/ML ~~LOC~~ SOLN
0.0000 [IU] | Freq: Three times a day (TID) | SUBCUTANEOUS | Status: DC
  Administered 2019-12-11: 2 [IU] via SUBCUTANEOUS
  Administered 2019-12-11: 1 [IU] via SUBCUTANEOUS
  Administered 2019-12-11: 2 [IU] via SUBCUTANEOUS
  Administered 2019-12-12 – 2019-12-13 (×3): 3 [IU] via SUBCUTANEOUS
  Administered 2019-12-13: 1 [IU] via SUBCUTANEOUS
  Administered 2019-12-14: 2 [IU] via SUBCUTANEOUS

## 2019-12-10 MED ORDER — CLONAZEPAM 0.5 MG PO TABS
0.5000 mg | ORAL_TABLET | Freq: Once | ORAL | Status: AC
  Administered 2019-12-10: 0.5 mg via ORAL
  Filled 2019-12-10: qty 1

## 2019-12-10 NOTE — ED Notes (Signed)
All meds given per MAR. Name/DOB verified with pt 

## 2019-12-10 NOTE — ED Provider Notes (Signed)
[facility] EMERGENCY DEPARTMENT Provider Note   CSN: [id number] Arrival date & time: 12/10/19  1622     History No chief complaint on file.   Doris Carr is a 84 y.o. female.  The history is provided by the patient and medical records. No language interpreter was used.   Doris Carr is a 84 y.o. female who presents to the Emergency Department complaining of vision changes.  Three nights ago she developed flashing colors and decreased vision in her left eye.  Currently she feels like her vision is close to her baseline.  Today she went to an associate of Dr. Bing Plume (Daryel Gerald, OD).  Denies recent illness.  No medication changes. Per records from the office there was concern for a branch retinal artery occlusion. She was referred for evaluation for possible CVA.    Past Medical History:  Diagnosis Date  . Anxiety   . Arthritis    osteoarthritis  . Back pain    sees a neurosurgeon  . Balance problems   . Balance problems   . Chronic diastolic heart failure (HCC)    Echo (8/12) with EF 60-65%, moderate LV hypertrophy, mild LVH  . Chronic renal insufficiency, stage III (moderate)   . 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 CKD (chronic kidney disease)    likely due to DM2 and HTN  . Current tear of meniscus right knee  . Depression   . Diabetes mellitus    type  2  . Diastolic CHF, chronic ([place]) 06/12/2011  . Fecal incontinence 05/14/2013  . GERD (gastroesophageal reflux disease)   . History of hiatal hernia    inguinal hernia  . History of total knee arthroplasty, right 11/16/2016  . HLD (hyperlipidemia)    myalgias with statins  . Hyperlipidemia 09/14/2011  . Hypertension    This has been difficult to control. She has had renal artery doppler evaluation on two different occasions, both times with no evidence for renal artery stenosis. She has had urinary catecholeamine collection that was unremarkable. Edema with 10 mg  amlodipine.   . Left bundle branch block   . Leg pain 05/14/2011  . Lower extremity edema    CHRONIC  . Lung nodule    VATS and biopsy in 2006 that was negative for malignancy  . Obesity   . Peripheral vascular disease ([place])    Lower extremity evaluation (12/12): ABI 0.79 right, 0.64 left; > 50% mid left SFA stenosis;  s/p L SFA stent and stent to R CIA 11/2011; ABI's post PTA - R 0.90, L 0.96 (TBI on R 0.51, L 0.61)  . Pneumonia    multiple times in childhood  . PONV (postoperative nausea and vomiting)   . Pre-operative cardiovascular examination 02/03/2016  . S/P cardiac catheterization    Left heart cath in 6/06 with normal coronaries, EF 60%.  . Syncope and collapse     Patient Active Problem List   Diagnosis Date Noted  . Transient vision disturbance 12/10/2019  . Renal insufficiency 12/10/2019  . Gait abnormality 11/20/2018  . Weakness 11/20/2018  . Right carotid bruit 04/17/2018  . History of total knee arthroplasty, right 11/16/2016  . Pre-operative cardiovascular examination 02/03/2016  . Normal coronary arteries  02/03/2016  . Fecal incontinence 05/14/2013  . PVD (peripheral vascular disease) ([place]) 09/14/2011  . Hyperlipidemia 09/14/2011  . Diastolic CHF, chronic ([place]) 06/12/2011  . Leg pain 05/14/2011  . HTN (hypertension) 01/10/2011  .  Balance problems   . Diabetes mellitus with nephropathy ([place])   . Syncope and collapse   . Left bundle branch block   . Chronic renal insufficiency, stage III (moderate)   . Lower extremity edema   . Depression   . Anxiety   . Obesity     Past Surgical History:  Procedure Laterality Date  . ABDOMINAL AORTAGRAM N/A 11/09/2011   Procedure: ABDOMINAL Maxcine Ham;  Surgeon: Sherren Mocha, MD;  Location: [facility] CATH LAB;  Service: Cardiovascular;  Laterality: N/A;  . abdominal aortogram  11/09/2011  . ANAL RECTAL MANOMETRY N/A 05/08/2013   Procedure: ANAL RECTAL MANOMETRY;  Surgeon: Leighton Ruff, MD;  Location: [facility] ENDOSCOPY;  Service:  Endoscopy;  Laterality: N/A;  . CARDIAC CATHETERIZATION  2006   NORMAL CORONARIES  . CATARACT EXTRACTION    . CERVICAL CONE BIOPSY    . FLEXIBLE SIGMOIDOSCOPY N/A 05/08/2013   Procedure: FLEXIBLE SIGMOIDOSCOPY;  Surgeon: Leighton Ruff, MD;  Location: [facility] ENDOSCOPY;  Service: Endoscopy;  Laterality: N/A;  rigid proctoscope get from o.r.  . GYNECOLOGIC CRYOSURGERY    . Knee Surg     Arthroscopic  . RECTAL ULTRASOUND N/A 05/08/2013   Procedure: RECTAL ULTRASOUND;  Surgeon: Leighton Ruff, MD;  Location: [facility] ENDOSCOPY;  Service: Endoscopy;  Laterality: N/A;  . Stints in leg arteries    . THORACOTOMY    . THYMIC CYST REMOVAL    . TONSILLECTOMY    . TOTAL KNEE ARTHROPLASTY Right 11/16/2016   Procedure: RIGHT TOTAL KNEE ARTHROPLASTY;  Surgeon: Latanya Maudlin, MD;  Location: [facility] ORS;  Service: Orthopedics;  Laterality: Right;  block  . VAGINAL HYSTERECTOMY  1985   WITH A/P REPAIR     OB History    Gravida  3   Para  3   Term  3   Preterm      AB      Living  3     SAB      TAB      Ectopic      Multiple      Live Births              Family History  Problem Relation Age of Onset  . Alzheimer's disease Mother   . Hypertension Mother   . Heart disease Mother   . Alzheimer's disease Father   . Diabetes Paternal Grandfather     Social History   Tobacco Use  . Smoking status: Former Smoker    Packs/day: 0.80    Years: 15.00    Pack years: 12.00    Types: Cigarettes    Quit date: 11/03/1984    Years since quitting: 35.1  . Smokeless tobacco: Never Used  Substance Use Topics  . Alcohol use: No    Alcohol/week: 0.0 standard drinks  . Drug use: No    Home Medications Prior to Admission medications   Medication Sig Start Date End Date Taking? Authorizing Provider  acetaminophen (TYLENOL) 650 MG CR tablet Take 650 mg by mouth 2 (two) times daily as needed (for arthritis pain.).   Yes [provider]  amLODipine (NORVASC) 5 MG tablet Take 5 mg by mouth 2  (two) times daily.   Yes [provider]  carvedilol (COREG) 25 MG tablet Take 1 tablet (25 mg total) by mouth 2 (two) times daily. 08/05/19  Yes Lorretta Harp, MD  citalopram (CELEXA) 20 MG tablet Take 20 mg by mouth daily.     Yes [provider]  clonazePAM (KLONOPIN) 0.5 MG  tablet Take 0.25-0.5 mg by mouth 2 (two) times daily. 0.25 mg in the morning, 0.5 mg in the evening   Yes [provider]  clopidogrel (PLAVIX) 75 MG tablet Take 1 tablet (75 mg total) by mouth daily. 12/31/18  Yes Lorretta Harp, MD  Cyanocobalamin (VITAMIN B-12 PO) Take 1 tablet by mouth daily.   Yes [provider]  estradiol (ESTRACE) 0.5 MG tablet Take 1 tablet (0.5 mg total) by mouth daily. 08/05/19  Yes Fontaine, Belinda Block, MD  Evolocumab (REPATHA SURECLICK) [id number] MG/ML SOAJ Inject 140 mg into the skin every 14 (fourteen) days. 08/26/19  Yes Lorretta Harp, MD  furosemide (LASIX) 20 MG tablet Take 20 mg by mouth.   Yes [provider]  losartan (COZAAR) 50 MG tablet Take 1 tablet (50 mg total) by mouth at bedtime. 08/05/19  Yes Lorretta Harp, MD  metFORMIN (GLUCOPHAGE) 500 MG tablet Take 500 mg by mouth at bedtime. 09/16/16  Yes [provider]  traZODone (DESYREL) 50 MG tablet Take 50 mg by mouth at bedtime. HALF A PILL AT BEDTIME   Yes [provider]  ALPRAZolam (XANAX) 1 MG tablet Take 1-2 tabs 30 mins prior to MRI.  May take one addt'l tab before entering scanner, if needed.  MUST HAVE DRIVER. Patient not taking: Reported on 07/12/2019 11/29/18   Marcial Pacas, MD  chlorthalidone (HYGROTON) 25 MG tablet Take 0.5 tablets (12.5 mg total) by mouth daily. Patient not taking: Reported on 12/10/2019 08/09/19   Lorretta Harp, MD    Allergies    Ciprofloxacin, Codeine, Darvocet [propoxyphene n-acetaminophen], Darvon, Hydralazine, Hydrocodone, Morphine and related, Pentazocine lactate, Potassium-containing compounds, Procaine hcl, and Sulfa drugs cross  reactors  Review of Systems   Review of Systems  All other systems reviewed and are negative.   Physical Exam Updated Vital Signs BP (!) 143/69   Pulse 74   Temp 98 F (36.7 C) (Oral)   Resp 16   Ht 5\' 5"  (1.651 m)   Wt 63.5 kg   SpO2 97%   BMI 23.30 kg/m   Physical Exam Vitals and nursing note reviewed.  Constitutional:      Appearance: She is well-developed.  HENT:     Head: Normocephalic and atraumatic.  Cardiovascular:     Rate and Rhythm: Normal rate and regular rhythm.     Heart sounds: No murmur.  Pulmonary:     Effort: Pulmonary effort is normal. No respiratory distress.     Breath sounds: Normal breath sounds.  Abdominal:     Palpations: Abdomen is soft.     Tenderness: There is no abdominal tenderness. There is no guarding or rebound.  Musculoskeletal:        General: No tenderness.  Skin:    General: Skin is warm and dry.  Neurological:     Mental Status: She is alert and oriented to person, place, and time.     Comments: No asymmetry of facial movements. Visual fields are grossly intact. Five out of five strength in all four extremities with sensation to light touch intact in all four extremities.  Psychiatric:        Behavior: Behavior normal.     ED Results / Procedures / Treatments   Labs (all labs ordered are listed, but only abnormal results are displayed) Labs Reviewed  CBC - Abnormal; Notable for the following components:      Result Value   RDW 11.1 (*)    All other components within normal  limits  COMPREHENSIVE METABOLIC PANEL - Abnormal; Notable for the following components:   Potassium 3.4 (*)    Chloride 96 (*)    Glucose, Bld 164 (*)    BUN 39 (*)    Creatinine, Ser 1.33 (*)    Total Protein 6.0 (*)    GFR calc non Af Amer 37 (*)    GFR calc Af Amer 43 (*)    All other components within normal limits  URINALYSIS, ROUTINE W REFLEX MICROSCOPIC - Abnormal; Notable for the following components:   Color, Urine STRAW (*)     Leukocytes,Ua TRACE (*)    Bacteria, UA RARE (*)    All other components within normal limits  CBG MONITORING, ED - Abnormal; Notable for the following components:   Glucose-Capillary 180 (*)    All other components within normal limits  SARS CORONAVIRUS 2 (TAT 6-24 HRS)  PROTIME-INR  DIFFERENTIAL  C-REACTIVE PROTEIN  SEDIMENTATION RATE  HEMOGLOBIN A1C  LIPID PANEL    EKG EKG Interpretation  Date/Time:  Tuesday December 10 2019 17:41:27 EST Ventricular Rate:  69 PR Interval:    QRS Duration: 163 QT Interval:  480 QTC Calculation: 515 R Axis:   21 Text Interpretation: Sinus rhythm Left bundle branch block Confirmed by Quintella Reichert [phone]) on 12/10/2019 5:45:31 PM   Radiology CT HEAD WO CONTRAST  Result Date: 12/10/2019 CLINICAL DATA:  Vision loss EXAM: CT HEAD WITHOUT CONTRAST TECHNIQUE: Contiguous axial images were obtained from the base of the skull through the vertex without intravenous contrast. COMPARISON:  MRI 12/04/2018 FINDINGS: Brain: No acute territorial infarction, hemorrhage, or intracranial mass. Moderate atrophy. Moderate severe hypodensity within the white matter consistent with chronic small vessel ischemic change. Ventricles appear enlarged but similar as compared with 12/04/2018. Vascular: No hyperdense vessels.  Carotid vascular calcification Skull: Normal. Negative for fracture or focal lesion. Sinuses/Orbits: No acute finding. Other: Incomplete fusion posterior arch of C1 IMPRESSION: 1. No CT evidence for acute intracranial abnormality. 2. Atrophy and chronic small vessel ischemic change of the white matter Electronically Signed   By: Donavan Foil M.D.   On: 12/10/2019 18:13    Procedures Procedures (including critical care time)  Medications Ordered in ED Medications  amLODipine (NORVASC) tablet 5 mg (has no administration in time range)  carvedilol (COREG) tablet 25 mg (has no administration in time range)  citalopram (CELEXA) tablet 20 mg (has no  administration in time range)  traZODone (DESYREL) tablet 50 mg (50 mg Oral Given 12/10/19 2142)  estradiol (ESTRACE) tablet 0.5 mg (has no administration in time range)  acetaminophen (TYLENOL) tablet 650 mg (has no administration in time range)    Or  acetaminophen (TYLENOL) 160 MG/5ML solution 650 mg (has no administration in time range)    Or  acetaminophen (TYLENOL) suppository 650 mg (has no administration in time range)  senna-docusate (Senokot-S) tablet 1 tablet (has no administration in time range)  heparin injection 5,000 Units (5,000 Units Subcutaneous Given 12/10/19 2141)  sodium chloride flush (NS) 0.9 % injection 3 mL (3 mLs Intravenous Not Given 12/10/19 2128)  sodium chloride flush (NS) 0.9 % injection 3 mL (has no administration in time range)  0.9 %  sodium chloride infusion (has no administration in time range)  aspirin suppository 300 mg (0 mg Rectal Hold 12/10/19 2126)    Or  aspirin tablet 325 mg ( Oral See Alternative 12/10/19 2126)  insulin aspart (novoLOG) injection 0-9 Units (has no administration in time range)  insulin aspart (novoLOG) injection 0-5  Units (0 Units Subcutaneous Hold 12/10/19 2145)  atorvastatin (LIPITOR) tablet 80 mg (80 mg Oral Refused 12/10/19 2137)  clonazePAM (KLONOPIN) disintegrating tablet 0.25 mg (has no administration in time range)    And  clonazePAM (KLONOPIN) disintegrating tablet 0.5 mg (0.5 mg Oral Not Given 12/10/19 2146)  HYDROcodone-acetaminophen (NORCO/VICODIN) 5-325 MG per tablet 1 tablet (1 tablet Oral Given 12/10/19 2145)  ondansetron (ZOFRAN) injection 4 mg (has no administration in time range)   stroke: mapping our early stages of recovery book ( Does not apply Given 12/10/19 2145)  potassium chloride SA (KLOR-CON) CR tablet 20 mEq (20 mEq Oral Given 12/10/19 2142)  clonazePAM (KLONOPIN) tablet 0.5 mg (0.5 mg Oral Given 12/10/19 2148)    ED Course  I have reviewed the triage vital signs and the nursing notes.  Pertinent labs & imaging results  that were available during my care of the patient were reviewed by me and considered in my medical decision making (see chart for details).    MDM Rules/Calculators/A&P                     Patient referred to the emergency department by ophthalmology for concern for possible retinal artery occlusion. She is relatively asymptomatic on evaluation in the emergency department. CT scan is negative for acute abnormality. Discussed the case with Dr. Rory Percy with neurology, who will see the patient and consult. Hospitalist consulted for admission for further evaluation. Patient updated findings of studies recommendation for admission and she is in agreement treatment plan.  Final Clinical Impression(s) / ED Diagnoses Final diagnoses:  Visual disturbance    Rx / DC Orders ED Discharge Orders    None       Quintella Reichert, MD 12/10/19 2202

## 2019-12-10 NOTE — Consult Note (Addendum)
Neurology Consultation  Reason for Consult: Visual loss in the left eye, concern for branch retinal artery occlusion Referring Physician: Dr. Marin Roberts  CC: Left eye visual problems  History is obtained from: Patient, outside records from [facility], [place] records  HPI: Doris Carr is a 84 y.o. female past medical history of back pain, arthritis, depression, diabetes, diastolic CHF, hyperlipidemia, hypertension, prior history of syncope, right knee pain status post total knee replacement, presented to the emergency room upon the recommendation of the optometrist who she saw for left eye visual loss.  She says that she was watching TV 2 nights ago when she started seeing flashes of light more on the left eye that were green to purple in color with some yellow flashes.  She had already taken her sleeping medication and went to bed.  The symptoms did not improve over the next day and she went to see her optometrist.  On the evaluation, the pharmacist did an examination and found that she has a possible branch retinal artery occlusion in the left eye and was sent to the ED for embolic stroke work-up. She denies having had strokes in the past.  She denies any fevers chills.  Denies lateralized tingling numbness or weakness.  Denies headaches. In the emergency room, ESR and CRP were done which were normal. Basic labs were drawn.  She is pending further imaging.    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 Past Medical History:  Diagnosis Date  . Anxiety   . Arthritis    osteoarthritis  . Back pain    sees a neurosurgeon  . Balance problems   . Balance problems   . Chronic diastolic heart failure (HCC)    Echo (8/12) with EF 60-65%, moderate LV hypertrophy, mild LVH  . Chronic renal insufficiency,  stage III (moderate)   . 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 CKD (chronic kidney disease)    likely due to DM2 and HTN  . Current tear of meniscus right knee  . Depression   . Diabetes mellitus    type  2  . Diastolic CHF, chronic ([place]) 06/12/2011  . Fecal incontinence 05/14/2013  . GERD (gastroesophageal reflux disease)   . History of hiatal hernia    inguinal hernia  . History of total knee arthroplasty, right 11/16/2016  . HLD (hyperlipidemia)    myalgias with statins  . Hyperlipidemia 09/14/2011  . Hypertension    This has been difficult to control. She has had renal artery doppler evaluation on two different occasions, both times with no evidence for renal artery stenosis. She has had urinary catecholeamine collection that was unremarkable. Edema with 10 mg amlodipine.   . Left bundle branch block   . Leg pain 05/14/2011  . Lower extremity edema    CHRONIC  . Lung nodule    VATS and biopsy in 2006 that was negative for malignancy  . Obesity   . Peripheral vascular disease ([place])    Lower extremity evaluation (12/12): ABI 0.79 right, 0.64 left; > 50% mid left SFA stenosis;  s/p L SFA stent and stent to R CIA 11/2011; ABI's post PTA - R 0.90, L 0.96 (TBI on R 0.51, L 0.61)  . Pneumonia    multiple  times in childhood  . PONV (postoperative nausea and vomiting)   . Pre-operative cardiovascular examination 02/03/2016  . S/P cardiac catheterization    Left heart cath in 6/06 with normal coronaries, EF 60%.  . Syncope and collapse     Family History  Problem Relation Age of Onset  . Alzheimer's disease Mother   . Hypertension Mother   . Heart disease Mother   . Alzheimer's disease Father   . Diabetes Paternal Grandfather     Social History:   reports that she quit smoking about 35 years ago. Her smoking use included cigarettes. She has a 12.00 pack-year smoking history. She has never used smokeless tobacco. She reports that she does not drink alcohol  or use drugs.  Medications No current facility-administered medications for this encounter.  Current Outpatient Medications:  .  acetaminophen (TYLENOL) 650 MG CR tablet, Take 650 mg by mouth 2 (two) times daily as needed (for arthritis pain.)., Disp: , Rfl:  .  amLODipine (NORVASC) 5 MG tablet, Take 5 mg by mouth 2 (two) times daily., Disp: , Rfl:  .  carvedilol (COREG) 25 MG tablet, Take 1 tablet (25 mg total) by mouth 2 (two) times daily., Disp: 180 tablet, Rfl: 3 .  citalopram (CELEXA) 20 MG tablet, Take 20 mg by mouth daily.  , Disp: , Rfl:  .  clonazePAM (KLONOPIN) 0.5 MG tablet, Take 0.25-0.5 mg by mouth 2 (two) times daily. 0.25 mg in the morning, 0.5 mg in the evening, Disp: , Rfl:  .  clopidogrel (PLAVIX) 75 MG tablet, Take 1 tablet (75 mg total) by mouth daily., Disp: 90 tablet, Rfl: 1 .  Cyanocobalamin (VITAMIN B-12 PO), Take 1 tablet by mouth daily., Disp: , Rfl:  .  estradiol (ESTRACE) 0.5 MG tablet, Take 1 tablet (0.5 mg total) by mouth daily., Disp: 90 tablet, Rfl: 4 .  Evolocumab (REPATHA SURECLICK) 782 MG/ML SOAJ, Inject 140 mg into the skin every 14 (fourteen) days., Disp: 6 pen, Rfl: 4 .  furosemide (LASIX) 20 MG tablet, Take 20 mg by mouth., Disp: , Rfl:  .  losartan (COZAAR) 50 MG tablet, Take 1 tablet (50 mg total) by mouth at bedtime., Disp: 90 tablet, Rfl: 3 .  metFORMIN (GLUCOPHAGE) 500 MG tablet, Take 500 mg by mouth at bedtime., Disp: , Rfl: 4 .  traZODone (DESYREL) 50 MG tablet, Take 50 mg by mouth at bedtime. HALF A PILL AT BEDTIME, Disp: , Rfl:  .  ALPRAZolam (XANAX) 1 MG tablet, Take 1-2 tabs 30 mins prior to MRI.  May take one addt'l tab before entering scanner, if needed.  MUST HAVE DRIVER. (Patient not taking: Reported on 07/12/2019), Disp: 3 tablet, Rfl: 0 .  chlorthalidone (HYGROTON) 25 MG tablet, Take 0.5 tablets (12.5 mg total) by mouth daily. (Patient not taking: Reported on 12/10/2019), Disp: 45 tablet, Rfl: 3   Exam: Current vital signs: BP 117/68 (BP  Location: Left Arm)   Pulse 74   Temp 98 F (36.7 C) (Oral)   Resp 16   Ht 5' 5"  (1.651 m)   Wt 63.5 kg   SpO2 97%   BMI 23.30 kg/m  Vital signs in last 24 hours: Temp:  [98 F (36.7 C)] 98 F (36.7 C) (03/09 1627) Pulse Rate:  [74] 74 (03/09 1627) Resp:  [16] 16 (03/09 1627) BP: (117)/(68) 117/68 (03/09 1627) SpO2:  [97 %] 97 % (03/09 1627) Weight:  [63.5 kg] 63.5 kg (03/09 1636)  GENERAL: Awake, alert in NAD HEENT: - Normocephalic  and atraumatic, dry mm, no LN++, no Thyromegally LUNGS - Clear to auscultation bilaterally with no wheezes CV - S1S2 RRR, no m/r/g, equal pulses bilaterally. ABDOMEN - Soft, nontender, nondistended with normoactive BS Ext: warm, well perfused, intact peripheral pulses, trace edema bilaterally  NEURO:  Mental Status: Very pleasant, AA&Ox3  Language: speech is nondysarthric.  Naming, repetition, fluency, and comprehension intact. Cranial Nerves: Left pupil was 5 mm and less reactive than the right-had just received dilated exam at the optometrist a few hours ago, EOMI, visual fields examination reveals a nasal quadrantanopsia in the left eye, dilated exam by the optometrist with Hollenhorst plaque inferior to the disc with retinal edema throughout the inferior arcade OS, no facial asymmetry, facial sensation intact, hearing intact, tongue/uvula/soft palate midline, normal sternocleidomastoid and trapezius muscle strength. No evidence of tongue atrophy or fibrillations Motor: 5/5 in bilateral upper extremities no drift.  4/5 in the right lower extremity due to pain with some drift.  No drift in the left leg. Tone: is normal and bulk is normal Sensation- Intact to light touch bilaterally Coordination: FTN intact bilaterally, no ataxia in BLE. Gait- deferred  NIHSS-1   Labs I have reviewed labs in epic and the results pertinent to this consultation are:  CBC    Component Value Date/Time   WBC 6.3 12/10/2019 1815   RBC 3.97 12/10/2019 1815   HGB  13.5 12/10/2019 1815   HCT 38.5 12/10/2019 1815   PLT 161 12/10/2019 1815   MCV 97.0 12/10/2019 1815   MCH 34.0 12/10/2019 1815   MCHC 35.1 12/10/2019 1815   RDW 11.1 (L) 12/10/2019 1815   LYMPHSABS 1.3 12/10/2019 1815   MONOABS 0.7 12/10/2019 1815   EOSABS 0.5 12/10/2019 1815   BASOSABS 0.1 12/10/2019 1815    CMP     Component Value Date/Time   NA 139 12/10/2019 1815   K 3.4 (L) 12/10/2019 1815   CL 96 (L) 12/10/2019 1815   CO2 28 12/10/2019 1815   GLUCOSE 164 (H) 12/10/2019 1815   BUN 39 (H) 12/10/2019 1815   CREATININE 1.33 (H) 12/10/2019 1815   CREATININE 1.47 (H) 05/06/2016 1535   CALCIUM 9.7 12/10/2019 1815   PROT 6.0 (L) 12/10/2019 1815   PROT 6.0 10/18/2016 0747   ALBUMIN 3.8 12/10/2019 1815   ALBUMIN 4.0 10/18/2016 0747   AST 19 12/10/2019 1815   ALT 11 12/10/2019 1815   ALKPHOS 43 12/10/2019 1815   BILITOT 0.8 12/10/2019 1815   BILITOT 0.2 10/18/2016 0747   GFRNONAA 37 (L) 12/10/2019 1815   GFRAA 43 (L) 12/10/2019 1815    Lipid Panel     Component Value Date/Time   CHOL 230 (H) 06/18/2019 1137   TRIG 159 (H) 06/18/2019 1137   HDL 59 06/18/2019 1137   CHOLHDL 3.9 06/18/2019 1137   CHOLHDL 4.7 05/06/2016 1535   VLDL 55 (H) 05/06/2016 1535   LDLCALC 143 (H) 06/18/2019 1137   LDLDIRECT 154.4 08/28/2013 1117     Imaging I have reviewed the images obtained:  CT-scan of the brain no acute changes  Assessment: 84 year old with above past medical history presenting for 2 days worth of left eye vision loss more so in the inferior nasal quadrant, and optometrist evaluation positive for Hollenhorst plaque in the left branch retinal artery in the inferior arcade. Likely branch retinal artery occlusion. 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 until cleared by bedside stroke swallow screen. Stroke team will follow with you. Plan discussed with ED provider Dr. Ralene Bathe   -- Amie Portland, MD [facility] Pager: [phone] If 7pm to 7am, please call on call as listed on AMION.   Outside records

## 2019-12-10 NOTE — ED Notes (Signed)
Dr. Ralene Bathe and Neurology at bedside Per Dr. Ralene Bathe, ok for pt to eat and drink. Pt provided with sandwich and water

## 2019-12-10 NOTE — ED Notes (Signed)
Assumed care of pt. Pt alert, speaking in full sentences. Breathing easy, non-labored. A&Ox4. VSS on monitors. Call light within reach, bed in lowest position, locked, siderails up x2. Will continue to monitor

## 2019-12-10 NOTE — ED Triage Notes (Signed)
Pt sent by eye doctor for possible retinol artery occlusion. Pt states two days ago she started seeing different colors in her left eye, that has now resolved. Pt states her vision is not normal in that eye but it is not blurry or doubled. Pt a.o, no neuro deficits noted.

## 2019-12-10 NOTE — ED Notes (Signed)
covid swab collected, labeled with 2 pt identifiers, and sent to lab

## 2019-12-10 NOTE — H&P (Signed)
History and Physical    Doris Carr [id number] DOB: Apr 16, 1936 DOA: 12/10/2019  PCP: Lawerance Cruel, MD   Patient coming from: Home   Chief Complaint: Visual disturbance   HPI: Doris Carr is a 84 y.o. female with medical history significant for anxiety, insomnia, chronic diastolic CHF, and osteoarthritis, uses a walker to ambulate at baseline, now presenting to the emergency department for evaluation of visual disturbance.  Patient reports that she been in her usual state of health and was watching a basketball game on TV the night of 12/07/2019 when she saw some "flashes" involving the left eye.  This was brief and has not recurred but she noted the following morning that she had a visual field deficit, involving the left eye only, and described as being unable to see the top of a page when trying to read.  She denies any headaches, chest pain or palpitations, and denies any focal numbness or weakness.  She was seen by optometry, there was concern for retinal artery occlusion, and she was directed to the ED.  ED Course: Upon arrival to the ED, patient is found to be afebrile, saturating well on room air, and with stable blood pressure.  EKG features a sinus rhythm with chronic LBBB and noncontrast head CT is negative for acute intracranial abnormality.  Chemistry panel is notable for potassium 3.4 and creatinine 1.33, up from 0.7 in 2018.  CBC is unremarkable.  COVID-19 screening test is pending.  Neurology was consulted by ED physician and recommended medical admission for further evaluation and management.  Review of Systems:  All other systems reviewed and apart from HPI, are negative.  Past Medical History:  Diagnosis Date  . Anxiety   . Arthritis    osteoarthritis  . Back pain    sees a neurosurgeon  . Balance problems   . Balance problems   . Chronic diastolic heart failure (HCC)    Echo (8/12) with EF 60-65%, moderate LV hypertrophy, mild LVH  . Chronic renal  insufficiency, stage III (moderate)   . 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 CKD (chronic kidney disease)    likely due to DM2 and HTN  . Current tear of meniscus right knee  . Depression   . Diabetes mellitus    type  2  . Diastolic CHF, chronic ([place]) 06/12/2011  . Fecal incontinence 05/14/2013  . GERD (gastroesophageal reflux disease)   . History of hiatal hernia    inguinal hernia  . History of total knee arthroplasty, right 11/16/2016  . HLD (hyperlipidemia)    myalgias with statins  . Hyperlipidemia 09/14/2011  . Hypertension    This has been difficult to control. She has had renal artery doppler evaluation on two different occasions, both times with no evidence for renal artery stenosis. She has had urinary catecholeamine collection that was unremarkable. Edema with 10 mg amlodipine.   . Left bundle branch block   . Leg pain 05/14/2011  . Lower extremity edema    CHRONIC  . Lung nodule    VATS and biopsy in 2006 that was negative for malignancy  . Obesity   . Peripheral vascular disease ([place])    Lower extremity evaluation (12/12): ABI 0.79 right, 0.64 left; > 50% mid left SFA stenosis;  s/p L SFA stent and stent to R CIA 11/2011; ABI's post PTA - R 0.90, L 0.96 (TBI on R 0.51, L 0.61)  . Pneumonia  multiple times in childhood  . PONV (postoperative nausea and vomiting)   . Pre-operative cardiovascular examination 02/03/2016  . S/P cardiac catheterization    Left heart cath in 6/06 with normal coronaries, EF 60%.  . Syncope and collapse     Past Surgical History:  Procedure Laterality Date  . ABDOMINAL AORTAGRAM N/A 11/09/2011   Procedure: ABDOMINAL Maxcine Ham;  Surgeon: Sherren Mocha, MD;  [facility] CATH LAB;  Service: Cardiovascular;  Laterality: N/A;  . abdominal aortogram  11/09/2011  . ANAL RECTAL MANOMETRY N/A 05/08/2013   Procedure: ANAL RECTAL MANOMETRY;  Surgeon: Leighton Ruff, MD;  Location: [facility] ENDOSCOPY;  Service: Endoscopy;   Laterality: N/A;  . CARDIAC CATHETERIZATION  2006   NORMAL CORONARIES  . CATARACT EXTRACTION    . CERVICAL CONE BIOPSY    . FLEXIBLE SIGMOIDOSCOPY N/A 05/08/2013   Procedure: FLEXIBLE SIGMOIDOSCOPY;  Surgeon: Leighton Ruff, MD;  Location: [facility] ENDOSCOPY;  Service: Endoscopy;  Laterality: N/A;  rigid proctoscope get from o.r.  . GYNECOLOGIC CRYOSURGERY    . Knee Surg     Arthroscopic  . RECTAL ULTRASOUND N/A 05/08/2013   Procedure: RECTAL ULTRASOUND;  Surgeon: Leighton Ruff, MD;  Location: [facility] ENDOSCOPY;  Service: Endoscopy;  Laterality: N/A;  . Stints in leg arteries    . THORACOTOMY    . THYMIC CYST REMOVAL    . TONSILLECTOMY    . TOTAL KNEE ARTHROPLASTY Right 11/16/2016   Procedure: RIGHT TOTAL KNEE ARTHROPLASTY;  Surgeon: Latanya Maudlin, MD;  Location: [facility] ORS;  Service: Orthopedics;  Laterality: Right;  block  . VAGINAL HYSTERECTOMY  1985   WITH A/P REPAIR     reports that she quit smoking about 35 years ago. Her smoking use included cigarettes. She has a 12.00 pack-year smoking history. She has never used smokeless tobacco. She reports that she does not drink alcohol or use drugs.  Allergies  Allergen Reactions  . Ciprofloxacin Nausea And Vomiting  . Codeine Nausea And Vomiting  . Darvocet [Propoxyphene N-Acetaminophen] Nausea Only  . Darvon Nausea Only  . Hydralazine Other (See Comments)    Unsure of reaction  . Hydrocodone Nausea And Vomiting  . Morphine And Related Nausea And Vomiting  . Pentazocine Lactate Nausea And Vomiting  . Potassium-Containing Compounds Nausea Only  . Procaine Hcl Nausea And Vomiting  . Sulfa Drugs Cross Reactors Rash    Family History  Problem Relation Age of Onset  . Alzheimer's disease Mother   . Hypertension Mother   . Heart disease Mother   . Alzheimer's disease Father   . Diabetes Paternal Grandfather      Prior to Admission medications   Medication Sig Start Date End Date Taking? Authorizing Provider  acetaminophen (TYLENOL) 650 MG CR  tablet Take 650 mg by mouth 2 (two) times daily as needed (for arthritis pain.).   Yes [provider]  amLODipine (NORVASC) 5 MG tablet Take 5 mg by mouth 2 (two) times daily.   Yes [provider]  carvedilol (COREG) 25 MG tablet Take 1 tablet (25 mg total) by mouth 2 (two) times daily. 08/05/19  Yes Lorretta Harp, MD  citalopram (CELEXA) 20 MG tablet Take 20 mg by mouth daily.     Yes [provider]  clonazePAM (KLONOPIN) 0.5 MG tablet Take 0.25-0.5 mg by mouth 2 (two) times daily. 0.25 mg in the morning, 0.5 mg in the evening   Yes [provider]  clopidogrel (PLAVIX) 75 MG tablet Take 1 tablet (75 mg total) by mouth daily.  12/31/18  Yes Lorretta Harp, MD  Cyanocobalamin (VITAMIN B-12 PO) Take 1 tablet by mouth daily.   Yes [provider]  estradiol (ESTRACE) 0.5 MG tablet Take 1 tablet (0.5 mg total) by mouth daily. 08/05/19  Yes Fontaine, Belinda Block, MD  Evolocumab (REPATHA SURECLICK) [id number] MG/ML SOAJ Inject 140 mg into the skin every 14 (fourteen) days. 08/26/19  Yes Lorretta Harp, MD  furosemide (LASIX) 20 MG tablet Take 20 mg by mouth.   Yes [provider]  losartan (COZAAR) 50 MG tablet Take 1 tablet (50 mg total) by mouth at bedtime. 08/05/19  Yes Lorretta Harp, MD  metFORMIN (GLUCOPHAGE) 500 MG tablet Take 500 mg by mouth at bedtime. 09/16/16  Yes [provider]  traZODone (DESYREL) 50 MG tablet Take 50 mg by mouth at bedtime. HALF A PILL AT BEDTIME   Yes [provider]  ALPRAZolam (XANAX) 1 MG tablet Take 1-2 tabs 30 mins prior to MRI.  May take one addt'l tab before entering scanner, if needed.  MUST HAVE DRIVER. Patient not taking: Reported on 07/12/2019 11/29/18   Marcial Pacas, MD  chlorthalidone (HYGROTON) 25 MG tablet Take 0.5 tablets (12.5 mg total) by mouth daily. Patient not taking: Reported on 12/10/2019 08/09/19   Lorretta Harp, MD    Physical Exam: Vitals:   12/10/19 1627 12/10/19 1636    BP: 117/68   Pulse: 74   Resp: 16   Temp: 98 F (36.7 C)   TempSrc: Oral   SpO2: 97%   Weight:  63.5 kg  Height:  5\' 5"  (1.651 m)    Constitutional: NAD, calm  Eyes: PERTLA, lids and conjunctivae normal ENMT: Mucous membranes are moist. Posterior pharynx clear of any exudate or lesions.   Neck: normal, supple, no masses, no thyromegaly Respiratory: clear to auscultation bilaterally, no wheezing, no crackles. No accessory muscle use.  Cardiovascular: S1 & S2 heard, regular rate and rhythm. Mild lower leg edema bilaterally.  Abdomen: No distension, no tenderness, soft. Bowel sounds active.  Musculoskeletal: no clubbing / cyanosis. No joint deformity upper and lower extremities.   Skin: no significant rashes, lesions, ulcers. Warm, dry, well-perfused. Neurologic: CN 2-12 grossly intact. Sensation intact, DTR normal. Strength 5/5 in all 4 limbs.  Psychiatric: Alert and oriented x 3. Pleasant and cooperative.    Labs and Imaging on Admission: I have personally reviewed following labs and imaging studies  CBC: Recent Labs  Lab 12/10/19 1815  WBC 6.3  NEUTROABS 3.8  HGB 13.5  HCT 38.5  MCV 97.0  PLT [id number]   Basic Metabolic Panel: Recent Labs  Lab 12/10/19 1815  NA 139  K 3.4*  CL 96*  CO2 28  GLUCOSE 164*  BUN 39*  CREATININE 1.33*  CALCIUM 9.7   GFR: Estimated Creatinine Clearance: 29.9 mL/min (A) (by C-G formula based on SCr of 1.33 mg/dL (H)). Liver Function Tests: Recent Labs  Lab 12/10/19 1815  AST 19  ALT 11  ALKPHOS 43  BILITOT 0.8  PROT 6.0*  ALBUMIN 3.8   No results for input(s): LIPASE, AMYLASE in the last 168 hours. No results for input(s): AMMONIA in the last 168 hours. Coagulation Profile: Recent Labs  Lab 12/10/19 1815  INR 0.9   Cardiac Enzymes: No results for input(s): CKTOTAL, CKMB, CKMBINDEX, TROPONINI in the last 168 hours. BNP (last 3 results) No results for input(s): PROBNP in the last 8760 hours. HbA1C: No results for  input(s): HGBA1C in the last 72 hours. CBG: No  results for input(s): GLUCAP in the last 168 hours. Lipid Profile: No results for input(s): CHOL, HDL, LDLCALC, TRIG, CHOLHDL, LDLDIRECT in the last 72 hours. Thyroid Function Tests: No results for input(s): TSH, T4TOTAL, FREET4, T3FREE, THYROIDAB in the last 72 hours. Anemia Panel: No results for input(s): VITAMINB12, FOLATE, FERRITIN, TIBC, IRON, RETICCTPCT in the last 72 hours. Urine analysis:    Component Value Date/Time   COLORURINE STRAW (A) 12/10/2019 1944   APPEARANCEUR CLEAR 12/10/2019 1944   LABSPEC 1.009 12/10/2019 1944   PHURINE 6.0 12/10/2019 1944   GLUCOSEU NEGATIVE 12/10/2019 1944   HGBUR NEGATIVE 12/10/2019 1944   BILIRUBINUR NEGATIVE 12/10/2019 [place] NEGATIVE 12/10/2019 1944   PROTEINUR NEGATIVE 12/10/2019 1944   UROBILINOGEN NEG 04/02/2013 1112   NITRITE NEGATIVE 12/10/2019 1944   LEUKOCYTESUR TRACE (A) 12/10/2019 1944   Sepsis Labs: @LABRCNTIP (procalcitonin:4,lacticidven:4) )No results found for this or any previous visit (from the past 240 hour(s)).   Radiological Exams on Admission: CT HEAD WO CONTRAST  Result Date: 12/10/2019 CLINICAL DATA:  Vision loss EXAM: CT HEAD WITHOUT CONTRAST TECHNIQUE: Contiguous axial images were obtained from the base of the skull through the vertex without intravenous contrast. COMPARISON:  MRI 12/04/2018 FINDINGS: Brain: No acute territorial infarction, hemorrhage, or intracranial mass. Moderate atrophy. Moderate severe hypodensity within the white matter consistent with chronic small vessel ischemic change. Ventricles appear enlarged but similar as compared with 12/04/2018. Vascular: No hyperdense vessels.  Carotid vascular calcification Skull: Normal. Negative for fracture or focal lesion. Sinuses/Orbits: No acute finding. Other: Incomplete fusion posterior arch of C1 IMPRESSION: 1. No CT evidence for acute intracranial abnormality. 2. Atrophy and chronic small vessel  ischemic change of the white matter Electronically Signed   By: Donavan Foil M.D.   On: 12/10/2019 18:13    EKG: Independently reviewed. Sinus rhythm, chronic LBBB.   Assessment/Plan  1. Visual disturbance  - Presents with visual deficit involving left eye after seeing "flashes" in that eye the night of 3/6  - No acute findings on head CT  - Neurology consulting and much appreciated  - Check MRI brain and MRA head and neck, check echocardiogram, fasting lipids, and A1c  - Continue cardiac monitoring, frequent neuro checks   - Consult with PT, OT, and SLP   - Start ASA 325 and Lipitor 80 mg    2. Type II DM  - No recent A1c on file  - Check A1c, use low-intensity SSI with Novolog for now    3. Hypertension  - BP at goal  - Continue Norvasc and Coreg   4. Renal insufficiency  - SCr is 1.33 in ED, up from 0.87 in 2018  - Hold Lasix and losartan initially, renally-dose medications, monitor    5. Chronic diastolic CHF  - Appears compensated, hold Lasix and losartan initially given renal insufficiency with unknown baseline    6. Anxiety; insomnia  - Continue low-dose clonazepam, trazodone     DVT prophylaxis: sq heparin  Code Status: Full  Family Communication: Discussed with patient  Disposition Plan: Likely back home pending stroke workup and therapy consultations  Consults called: Neurology Admission status: Observation     Vianne Bulls, MD [facility] Pager: See [email or web address]  If 7AM-7PM, please contact the daytime attending [email or web address]  12/10/2019, 9:19 PM

## 2019-12-11 ENCOUNTER — Observation Stay (HOSPITAL_COMMUNITY): Payer: PPO

## 2019-12-11 ENCOUNTER — Other Ambulatory Visit: Payer: Self-pay

## 2019-12-11 ENCOUNTER — Observation Stay (HOSPITAL_BASED_OUTPATIENT_CLINIC_OR_DEPARTMENT_OTHER): Payer: PPO

## 2019-12-11 DIAGNOSIS — F419 Anxiety disorder, unspecified: Secondary | ICD-10-CM | POA: Diagnosis present

## 2019-12-11 DIAGNOSIS — I6523 Occlusion and stenosis of bilateral carotid arteries: Secondary | ICD-10-CM | POA: Diagnosis not present

## 2019-12-11 DIAGNOSIS — I447 Left bundle-branch block, unspecified: Secondary | ICD-10-CM | POA: Diagnosis present

## 2019-12-11 DIAGNOSIS — F329 Major depressive disorder, single episode, unspecified: Secondary | ICD-10-CM | POA: Diagnosis present

## 2019-12-11 DIAGNOSIS — I6381 Other cerebral infarction due to occlusion or stenosis of small artery: Secondary | ICD-10-CM | POA: Diagnosis present

## 2019-12-11 DIAGNOSIS — I639 Cerebral infarction, unspecified: Secondary | ICD-10-CM | POA: Diagnosis not present

## 2019-12-11 DIAGNOSIS — E1151 Type 2 diabetes mellitus with diabetic peripheral angiopathy without gangrene: Secondary | ICD-10-CM | POA: Diagnosis present

## 2019-12-11 DIAGNOSIS — I10 Essential (primary) hypertension: Secondary | ICD-10-CM | POA: Diagnosis not present

## 2019-12-11 DIAGNOSIS — I6389 Other cerebral infarction: Secondary | ICD-10-CM

## 2019-12-11 DIAGNOSIS — N1831 Chronic kidney disease, stage 3a: Secondary | ICD-10-CM | POA: Diagnosis present

## 2019-12-11 DIAGNOSIS — R339 Retention of urine, unspecified: Secondary | ICD-10-CM | POA: Diagnosis not present

## 2019-12-11 DIAGNOSIS — H34232 Retinal artery branch occlusion, left eye: Secondary | ICD-10-CM | POA: Diagnosis present

## 2019-12-11 DIAGNOSIS — N289 Disorder of kidney and ureter, unspecified: Secondary | ICD-10-CM | POA: Diagnosis not present

## 2019-12-11 DIAGNOSIS — E876 Hypokalemia: Secondary | ICD-10-CM | POA: Diagnosis not present

## 2019-12-11 DIAGNOSIS — F5105 Insomnia due to other mental disorder: Secondary | ICD-10-CM | POA: Diagnosis present

## 2019-12-11 DIAGNOSIS — Z6823 Body mass index (BMI) 23.0-23.9, adult: Secondary | ICD-10-CM | POA: Diagnosis not present

## 2019-12-11 DIAGNOSIS — H5340 Unspecified visual field defects: Secondary | ICD-10-CM | POA: Diagnosis present

## 2019-12-11 DIAGNOSIS — E1165 Type 2 diabetes mellitus with hyperglycemia: Secondary | ICD-10-CM | POA: Diagnosis present

## 2019-12-11 DIAGNOSIS — R29701 NIHSS score 1: Secondary | ICD-10-CM | POA: Diagnosis present

## 2019-12-11 DIAGNOSIS — Z20822 Contact with and (suspected) exposure to covid-19: Secondary | ICD-10-CM | POA: Diagnosis present

## 2019-12-11 DIAGNOSIS — I6522 Occlusion and stenosis of left carotid artery: Secondary | ICD-10-CM | POA: Diagnosis present

## 2019-12-11 DIAGNOSIS — E1121 Type 2 diabetes mellitus with diabetic nephropathy: Secondary | ICD-10-CM | POA: Diagnosis not present

## 2019-12-11 DIAGNOSIS — Z90710 Acquired absence of both cervix and uterus: Secondary | ICD-10-CM | POA: Diagnosis not present

## 2019-12-11 DIAGNOSIS — K219 Gastro-esophageal reflux disease without esophagitis: Secondary | ICD-10-CM | POA: Diagnosis present

## 2019-12-11 DIAGNOSIS — E669 Obesity, unspecified: Secondary | ICD-10-CM | POA: Diagnosis present

## 2019-12-11 DIAGNOSIS — I130 Hypertensive heart and chronic kidney disease with heart failure and stage 1 through stage 4 chronic kidney disease, or unspecified chronic kidney disease: Secondary | ICD-10-CM | POA: Diagnosis present

## 2019-12-11 DIAGNOSIS — I672 Cerebral atherosclerosis: Secondary | ICD-10-CM | POA: Diagnosis present

## 2019-12-11 DIAGNOSIS — H539 Unspecified visual disturbance: Secondary | ICD-10-CM

## 2019-12-11 DIAGNOSIS — E1122 Type 2 diabetes mellitus with diabetic chronic kidney disease: Secondary | ICD-10-CM | POA: Diagnosis present

## 2019-12-11 DIAGNOSIS — I5032 Chronic diastolic (congestive) heart failure: Secondary | ICD-10-CM | POA: Diagnosis present

## 2019-12-11 DIAGNOSIS — E785 Hyperlipidemia, unspecified: Secondary | ICD-10-CM | POA: Diagnosis present

## 2019-12-11 LAB — BASIC METABOLIC PANEL
Anion gap: 13 (ref 5–15)
BUN: 27 mg/dL — ABNORMAL HIGH (ref 8–23)
CO2: 29 mmol/L (ref 22–32)
Calcium: 9 mg/dL (ref 8.9–10.3)
Chloride: 94 mmol/L — ABNORMAL LOW (ref 98–111)
Creatinine, Ser: 0.92 mg/dL (ref 0.44–1.00)
GFR calc Af Amer: >60 mL/min (ref >60)
GFR calc non Af Amer: 58 mL/min — ABNORMAL LOW (ref >60)
Glucose, Bld: 182 mg/dL — ABNORMAL HIGH (ref 70–99)
Potassium: 3.2 mmol/L — ABNORMAL LOW (ref 3.5–5.1)
Sodium: 136 mmol/L (ref 135–145)

## 2019-12-11 LAB — GLUCOSE, CAPILLARY
Glucose-Capillary: 184 mg/dL — ABNORMAL HIGH (ref 70–99)
Glucose-Capillary: 176 mg/dL — ABNORMAL HIGH (ref 70–99)
Glucose-Capillary: 149 mg/dL — ABNORMAL HIGH (ref 70–99)
Glucose-Capillary: 166 mg/dL — ABNORMAL HIGH (ref 70–99)

## 2019-12-11 LAB — HEMOGLOBIN A1C
Hgb A1c MFr Bld: 7.6 % — ABNORMAL HIGH (ref 4.8–5.6)
Mean Plasma Glucose: 171.42 mg/dL

## 2019-12-11 LAB — LIPID PANEL
Cholesterol: 165 mg/dL (ref 0–200)
HDL: 59 mg/dL (ref >40)
LDL Cholesterol: 72 mg/dL (ref 0–99)
Total CHOL/HDL Ratio: 2.8 RATIO
Triglycerides: 168 mg/dL — ABNORMAL HIGH (ref <150)
VLDL: 34 mg/dL (ref 0–40)

## 2019-12-11 LAB — ECHOCARDIOGRAM COMPLETE
Height: 65 in
Weight: 2240 oz

## 2019-12-11 LAB — SARS CORONAVIRUS 2 (TAT 6-24 HRS): SARS Coronavirus 2: NEGATIVE

## 2019-12-11 MED ORDER — IOHEXOL 350 MG/ML SOLN
75.0000 mL | Freq: Once | INTRAVENOUS | Status: AC
  Administered 2019-12-11: 75 mL via INTRAVENOUS

## 2019-12-11 MED ORDER — GADOBUTROL 1 MMOL/ML IV SOLN
6.5000 mL | Freq: Once | INTRAVENOUS | Status: AC | PRN
  Administered 2019-12-11: 6.5 mL via INTRAVENOUS

## 2019-12-11 MED ORDER — SODIUM CHLORIDE 0.9 % IV SOLN: 250.0000 mL | INTRAVENOUS | Status: DC | PRN

## 2019-12-11 MED ORDER — ATORVASTATIN CALCIUM 40 MG PO TABS: 40.0000 mg | ORAL_TABLET | Freq: Every day | ORAL | Status: DC

## 2019-12-11 MED ORDER — CLOPIDOGREL BISULFATE 75 MG PO TABS
75.0000 mg | ORAL_TABLET | Freq: Every day | ORAL | Status: DC
  Administered 2019-12-11 – 2019-12-14 (×3): 75 mg via ORAL
  Filled 2019-12-11 (×3): qty 1

## 2019-12-11 NOTE — ED Notes (Signed)
Pt remains in MRI at this time  

## 2019-12-11 NOTE — ED Notes (Addendum)
Pt gone to MRI. Need to return to draw labs

## 2019-12-11 NOTE — Progress Notes (Addendum)
Physical Therapy Evaluation Patient Details Name: ALAYSHIA LANCE MRN: [id number] DOB: [birth year] Today's Date: 12/11/2019   History of Present Illness  84 y.o. female presented to her optometrist for left eye visual loss.  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 PMH significant for chronic diastolic CHF, OA, chronic renal insufficiency, HTN, and R TKA.  MRI positive for CVA, stroke work-up underway.    Clinical Impression  Pt demonstrated difficulty with L eye tracking during "H" pattern and reports "that's difficult to do". She states she was seeing a black spot in her L eye but resolved quickly. Pt demonstrates dec L peripheral visual field and reports seeing PT as "fuzzy" until she utilized her glasses (pt states this is her norm). She also presents with strength and ambulation deficits, requiring min guard for bed mobility, min assist to transfer sit to stand, and she reported feeling weak in B LE's but min assist to walk fwd/back ~3 ft with RW. PTA pt utilized RW for household and community ambulation with the help of her spouse and [place] aides for ADL's. Pt would continue to benefit from skilled physical therapy services at this time while admitted and after d/c to address the below listed limitations in order to improve overall safety and independence with functional mobility.     Follow Up Recommendations Supervision/Assistance - 24 hour;Home health PT    Equipment Recommendations  Rolling walker with 5" wheels    Recommendations for Other Services       Precautions / Restrictions Precautions Precautions: Fall Restrictions Weight Bearing Restrictions: No      Mobility  Bed Mobility Overal bed mobility: Needs Assistance Bed Mobility: Supine to Sit     Supine to sit: Min guard     General bed mobility comments: Required use of bedrails   Transfers Overall transfer level: Needs  assistance Equipment used: Rolling walker (2 wheeled) Transfers: Sit to/from Stand Sit to Stand: Min guard;Min assist         General transfer comment: Took pt 2 tries to stand initially (possibly due to soft bed). Pt required min assist and use of momentum to stand safely.   Ambulation/Gait Ambulation/Gait assistance: Min assist Gait Distance (Feet): 6 Feet Assistive device: Rolling walker (2 wheeled) Gait Pattern/deviations: Step-through pattern;Decreased stride length     General Gait Details: Pt was able to ambulate a few steps forward and backward (to/from bed) using RW and min assist for safety and balance.  Stairs            Wheelchair Mobility    Modified Rankin (Stroke Patients Only) Modified Rankin (Stroke Patients Only) Pre-Morbid Rankin Score: Moderate disability Modified Rankin: Moderate disability     Balance Overall balance assessment: Needs assistance Sitting-balance support: Feet supported;Bilateral upper extremity supported Sitting balance-Leahy Scale: Fair Sitting balance - Comments: Pt demonstrated significant posterior lean during LE strength testing (no LOB(   Standing balance support: Bilateral upper extremity supported;During functional activity Standing balance-Leahy Scale: Fair                               Pertinent Vitals/Pain Pain Assessment: No/denies pain    Home Living Family/patient expects to be discharged to:: Private residence Living Arrangements: Spouse/significant other Available Help at Discharge: Family;Home health Type of Home: House Home Access: Stairs to enter Entrance Stairs-Rails: None Entrance Stairs-Number of  Steps: 1 Home Layout: One level Home Equipment: [place] - 2 wheels;Bedside commode;Grab bars - tub/shower      Prior Function Level of Independence: Needs assistance   Gait / Transfers Assistance Needed: ambulates with RW  ADL's / Homemaking Assistance Needed: spouse helps with entering and  exiting shower; pt washes herself        Hand Dominance        Extremity/Trunk Assessment   Upper Extremity Assessment Upper Extremity Assessment: Defer to OT evaluation    Lower Extremity Assessment Lower Extremity Assessment: RLE deficits/detail;LLE deficits/detail RLE Deficits / Details: ROM: WNL. Strength: Knee flex/ext 5/5, DF 5/5, Hip flex 3+/5. Pt reports failed R TKA many years ago LLE Deficits / Details: ROM: WNL. Strength: Knee flex/ext 5/5, DF 5/5, Hip flex 4/5.        Communication   Communication: No difficulties  Cognition Arousal/Alertness: Awake/alert Behavior During Therapy: WFL for tasks assessed/performed Overall Cognitive Status: Within Functional Limits for tasks assessed                                        General Comments      Exercises     Assessment/Plan    PT Assessment Patient needs continued PT services  PT Problem List Decreased strength;Decreased activity tolerance;Decreased balance;Decreased mobility       PT Treatment Interventions DME instruction;Gait training;Functional mobility training;Therapeutic activities;Balance training;Therapeutic exercise    PT Goals (Current goals can be found in the Care Plan section)  Acute Rehab PT Goals Patient Stated Goal: Return home and to prior level of function PT Goal Formulation: With patient Time For Goal Achievement: 12/25/19 Potential to Achieve Goals: Good    Frequency Min 4X/week   Barriers to discharge        Co-evaluation               AM-PAC PT "6 Clicks" Mobility  Outcome Measure Help needed turning from your back to your side while in a flat bed without using bedrails?: A Little Help needed moving from lying on your back to sitting on the side of a flat bed without using bedrails?: A Little Help needed moving to and from a bed to a chair (including a wheelchair)?: A Little Help needed standing up from a chair using your arms (e.g., wheelchair or  bedside chair)?: A Little Help needed to walk in hospital room?: A Lot Help needed climbing 3-5 steps with a railing? : A Lot 6 Click Score: 16    End of Session Equipment Utilized During Treatment: Gait belt Activity Tolerance: Patient tolerated treatment well;Patient limited by fatigue Patient left: in bed;with call bell/phone within reach;with bed alarm set   PT Visit Diagnosis: Other abnormalities of gait and mobility (R26.89);Muscle weakness (generalized) (M62.81)    Time: [id number] PT Time Calculation (min) (ACUTE ONLY): 29 min   Charges:   PT Evaluation $PT Eval Moderate Complexity: 1 Mod PT Treatments $Gait Training: 8-22 mins        Tanav Orsak, SPT Acute Rehab  [id number]   Lanson Randle 12/11/2019, 4:38 PM

## 2019-12-11 NOTE — Plan of Care (Signed)
  Problem: Education: Goal: Knowledge of disease or condition will improve Outcome: Not Progressing Goal: Knowledge of secondary prevention will improve Outcome: Not Progressing   Problem: Ischemic Stroke/TIA Tissue Perfusion: Goal: Complications of ischemic stroke/TIA will be minimized Outcome: Not Progressing

## 2019-12-11 NOTE — Progress Notes (Signed)
Progress Note    Doris Carr  [id number] DOB: [birth year]  DOA: 12/10/2019 PCP: Lawerance Cruel, MD    Brief Narrative:    Medical records reviewed and are as summarized below:  Doris Carr is an 84 y.o. female ear after being told by her optometrist to present for left eye visual loss.  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  MRI positive for CVA in stroke work-up underway  Assessment/Plan:   Principal Problem:   Acute ischemic stroke (HCC) Active Problems:   Diabetes mellitus with nephropathy (HCC)   Anxiety   HTN (hypertension)   Diastolic CHF, chronic (HCC)   Transient vision disturbance   Renal insufficiency   CVA: Acute lacunar infarct at the left caudate head causing acute visual disturbance  - Presents with visual deficit involving left eye after seeing "flashes" in that eye the night of 3/6  - No acute findings on head CT  - Neurology consult appreciated, await final recommendations - Echo pending -CTA pending -Continue telemetry - LDL: 72: Goal of less than 70 -Hemoglobin A1c 7.6: Goal of less than 7 - Consult with PT, OT, and SLP   - ASA 325 and Lipitor: Takes Repatha as an outpatient    Type II DM  -use low-intensity SSI with Novolog for now   -On Metformin 500 daily at home  Hypertension  - BP at goal  - Continue Norvasc and Coreg  -Will allow for permissive hypertension   Renal insufficiency  - SCr is 1.33 in ED, up from 0.87 in 2018  - Hold Lasix and losartan initially, renally-dose medications, monitor   -Check BMP this a.m.  Chronic diastolic CHF  - Appears compensated, hold Lasix and losartan initially given renal insufficiency with unknown baseline    Anxiety; insomnia  - Continue low-dose clonazepam, trazodone    Urinary retention -Patient states she is not having any symptoms of infection -Had normal bowel movement yesterday -Patient states she has a "shy  bladder"  Family Communication/Anticipated D/C date and plan/Code Status   DVT prophylaxis: Heparin Code Status: Full Code.  Family Communication: Patient states she will speak with family Disposition Plan: Patient needs continued stroke work-up including CTA as well as echo, PT/OT evaluation.  Final recommendations from neurology   Medical Consultants:    Neurology     Subjective:   States she needs to urinate but needs privacy to do so  Objective:    Vitals:   12/11/19 0140 12/11/19 0455 12/11/19 0517 12/11/19 0719  BP: (!) 153/78 (!) 149/76 (!) 168/94 (!) 143/76  Pulse:  73 72   Resp: 13 18 16 20   Temp:   (!) 97.5 F (36.4 C) 97.7 F (36.5 C)  TempSrc:   Oral Oral  SpO2:  97% 94% 97%  Weight:      Height:        Intake/Output Summary (Last 24 hours) at 12/11/2019 [id number] Last data filed at 12/11/2019 0800 Gross per 24 hour  Intake 50 ml  Output --  Net 50 ml   Filed Weights   12/10/19 1636  Weight: 63.5 kg    Exam: In bed, no acute distress Mucous membranes moist No increased work of breathing, lungs clear Regular rate and rhythm Positive bowel sounds soft nontender, mild protrusion in the lower abdomen where bladder is Moves all 4 extremities, patient has a visual defect in the left eye Alert and orient  x3  Data Reviewed:   I have personally reviewed following labs and imaging studies:  Labs: Labs show the following:   Basic Metabolic Panel: Recent Labs  Lab 12/10/19 1815  NA 139  K 3.4*  CL 96*  CO2 28  GLUCOSE 164*  BUN 39*  CREATININE 1.33*  CALCIUM 9.7   GFR Estimated Creatinine Clearance: 29.9 mL/min (A) (by C-G formula based on SCr of 1.33 mg/dL (H)). Liver Function Tests: Recent Labs  Lab 12/10/19 1815  AST 19  ALT 11  ALKPHOS 43  BILITOT 0.8  PROT 6.0*  ALBUMIN 3.8   No results for input(s): LIPASE, AMYLASE in the last 168 hours. No results for input(s): AMMONIA in the last 168 hours. Coagulation profile Recent  Labs  Lab 12/10/19 1815  INR 0.9    CBC: Recent Labs  Lab 12/10/19 1815  WBC 6.3  NEUTROABS 3.8  HGB 13.5  HCT 38.5  MCV 97.0  PLT 161   Cardiac Enzymes: No results for input(s): CKTOTAL, CKMB, CKMBINDEX, TROPONINI in the last 168 hours. BNP (last 3 results) No results for input(s): PROBNP in the last 8760 hours. CBG: Recent Labs  Lab 12/10/19 2139 12/11/19 0550  GLUCAP 180* 184*   D-Dimer: No results for input(s): DDIMER in the last 72 hours. Hgb A1c: Recent Labs    12/11/19 0612  HGBA1C 7.6*   Lipid Profile: Recent Labs    12/11/19 0612  CHOL 165  HDL 59  LDLCALC 72  TRIG 168*  CHOLHDL 2.8   Thyroid function studies: No results for input(s): TSH, T4TOTAL, T3FREE, THYROIDAB in the last 72 hours.  Invalid input(s): FREET3 Anemia work up: No results for input(s): VITAMINB12, FOLATE, FERRITIN, TIBC, IRON, RETICCTPCT in the last 72 hours. Sepsis Labs: Recent Labs  Lab 12/10/19 1815  WBC 6.3    Microbiology Recent Results (from the past 240 hour(s))  SARS CORONAVIRUS 2 (TAT 6-24 HRS) Nasopharyngeal Nasopharyngeal Swab     Status: None   Collection Time: 12/10/19  8:12 PM   Specimen: Nasopharyngeal Swab  Result Value Ref Range Status   SARS Coronavirus 2 NEGATIVE NEGATIVE Final    Comment: (NOTE) SARS-CoV-2 target nucleic acids are NOT DETECTED. The SARS-CoV-2 RNA is generally detectable in upper and lower respiratory specimens during the acute phase of infection. Negative results do not preclude SARS-CoV-2 infection, do not rule out co-infections with other pathogens, and should not be used as the sole basis for treatment or other patient management decisions. Negative results must be combined with clinical observations, patient history, and epidemiological information. The expected result is Negative. Fact Sheet for Patients: SugarRoll.be Fact Sheet for Healthcare  Providers: https://www.woods-mathews.com/ This test is not yet approved or cleared by the [place] FDA and  has been authorized for detection and/or diagnosis of SARS-CoV-2 by FDA under an Emergency Use Authorization (EUA). This EUA will remain  in effect (meaning this test can be used) for the duration of the COVID-19 declaration under Section 56 4(b)(1) of the Act, 21 U.S.C. section 360bbb-3(b)(1), unless the authorization is terminated or revoked sooner. Performed at [facility], [place] [address], [place], [place] [address]     Procedures and diagnostic studies:  CT HEAD WO CONTRAST  Result Date: 12/10/2019 CLINICAL DATA:  Vision loss EXAM: CT HEAD WITHOUT CONTRAST TECHNIQUE: Contiguous axial images were obtained from the base of the skull through the vertex without intravenous contrast. COMPARISON:  MRI 12/04/2018 FINDINGS: Brain: No acute territorial infarction, hemorrhage, or intracranial mass. Moderate  atrophy. Moderate severe hypodensity within the white matter consistent with chronic small vessel ischemic change. Ventricles appear enlarged but similar as compared with 12/04/2018. Vascular: No hyperdense vessels.  Carotid vascular calcification Skull: Normal. Negative for fracture or focal lesion. Sinuses/Orbits: No acute finding. Other: Incomplete fusion posterior arch of C1 IMPRESSION: 1. No CT evidence for acute intracranial abnormality. 2. Atrophy and chronic small vessel ischemic change of the white matter Electronically Signed   By: Donavan Foil M.D.   On: 12/10/2019 18:13   MR ANGIO HEAD WO CONTRAST  Result Date: 12/11/2019 CLINICAL DATA:  Visual disturbance. Flashing in the left eye. Retinal artery occlusion by fundoscopy, per report EXAM: MRI HEAD WITHOUT CONTRAST MRA HEAD WITHOUT CONTRAST TECHNIQUE: Multiplanar, multiecho pulse sequences of the brain and surrounding structures were obtained without intravenous contrast. Angiographic images of the head were  obtained using MRA technique without contrast. COMPARISON:  Head CT from yesterday and brain MRI from 12/04/2018 FINDINGS: MRI HEAD FINDINGS Brain: Subcentimeter acute infarct at the left caudate head. No optic pathway ischemia. Clear suprasellar cistern. Atrophy with ventriculomegaly. Medial temporal volume loss is at least moderate but congruous with the generalized volume loss. Chronic small vessel ischemia in the deep white matter which is moderate to extensive. No hemorrhage, obstructive hydrocephalus, or collection. No chronic blood products. Vascular: Arterial findings below. Normal dural venous sinus flow voids Skull and upper cervical spine: Negative for marrow lesion. There is upper cervical degenerative disease with C2-3 anterolisthesis. Sinuses/Orbits: Bilateral cataract resection. No evidence of intra-ocular collection or orbital inflammation. MRA HEAD FINDINGS Symmetric vertebral and carotid artery size. Hypoplastic right A1 segment. Flow gap in the distal right vertebral artery from stenosis and likely accentuated by vessel tortuosity. There is also high-grade narrowing of the proximal basilar. Posterior circulation branch vessels are patent. Robust posterior communicating arteries. Hypoplastic right A1 segment. Negative for aneurysm or branch occlusion. Symmetric signal in the proximal ophthalmic arteries bilaterally. IMPRESSION: Brain MRI: 1. Acute lacunar infarct at the left caudate head. 2. Generalized atrophy and extensive hemispheric small vessel disease without significant change compared to brain MRI 1 year ago. Intracranial MRA: 1. No emergent finding. No notable narrowing or luminal irregularity at the level of the left cavernous ICA. 2. High-grade vertebrobasilar stenoses with robust posterior communicating arteries. Electronically Signed   By: Monte Fantasia M.D.   On: 12/11/2019 06:36   MR Angiogram Neck W or Wo Contrast  Result Date: 12/11/2019 CLINICAL DATA:  Visual changes in the  left eye. ER referral for retinal artery occlusion EXAM: MRA NECK WITHOUT AND WITH CONTRAST TECHNIQUE: Multiplanar and multiecho pulse sequences of the neck were obtained without and with intravenous contrast. Angiographic images of the neck were obtained using MRA technique without and with intravenous contrast. CONTRAST:  6.71mL GADAVIST GADOBUTROL 1 MMOL/ML IV SOLN COMPARISON:  None. FINDINGS: Time-of-flight imaging shows antegrade flow in both carotid and vertebral arteries. This series is degraded by motion. Postcontrast imaging is complicated by patient activating squeeze ball during bolus injections such that images are early and late relative to the arterial phase. Based on source images and accounting for venous contamination: The arch is negative with 3 vessel branching. The bilateral systems show tortuosity with asymmetric left-sided atherosclerotic plaque at the bifurcation leading the 60% luminal stenosis. Plaque at the bulb is excavated, of indeterminate chronicity. No dissection is seen. No proximal subclavian stenosis. Vertebral artery assessment is notably limited by the degree of venous contamination, with no occlusion or flow limiting stenosis seen in the  neck. There is occlusion of the distal right V4 segment and high-grade narrowing of the proximal basilar as described on dedicated intracranial MRA. IMPRESSION: 1. Limited study due to patient factors, as above. 2. Atherosclerosis with asymmetric involvement at the left carotid bifurcation where there is 60% luminal stenosis and excavated plaque which could relate to the patient's history. Electronically Signed   By: Monte Fantasia M.D.   On: 12/11/2019 07:18   MR BRAIN WO CONTRAST  Result Date: 12/11/2019 CLINICAL DATA:  Visual disturbance. Flashing in the left eye. Retinal artery occlusion by fundoscopy, per report EXAM: MRI HEAD WITHOUT CONTRAST MRA HEAD WITHOUT CONTRAST TECHNIQUE: Multiplanar, multiecho pulse sequences of the brain and  surrounding structures were obtained without intravenous contrast. Angiographic images of the head were obtained using MRA technique without contrast. COMPARISON:  Head CT from yesterday and brain MRI from 12/04/2018 FINDINGS: MRI HEAD FINDINGS Brain: Subcentimeter acute infarct at the left caudate head. No optic pathway ischemia. Clear suprasellar cistern. Atrophy with ventriculomegaly. Medial temporal volume loss is at least moderate but congruous with the generalized volume loss. Chronic small vessel ischemia in the deep white matter which is moderate to extensive. No hemorrhage, obstructive hydrocephalus, or collection. No chronic blood products. Vascular: Arterial findings below. Normal dural venous sinus flow voids Skull and upper cervical spine: Negative for marrow lesion. There is upper cervical degenerative disease with C2-3 anterolisthesis. Sinuses/Orbits: Bilateral cataract resection. No evidence of intra-ocular collection or orbital inflammation. MRA HEAD FINDINGS Symmetric vertebral and carotid artery size. Hypoplastic right A1 segment. Flow gap in the distal right vertebral artery from stenosis and likely accentuated by vessel tortuosity. There is also high-grade narrowing of the proximal basilar. Posterior circulation branch vessels are patent. Robust posterior communicating arteries. Hypoplastic right A1 segment. Negative for aneurysm or branch occlusion. Symmetric signal in the proximal ophthalmic arteries bilaterally. IMPRESSION: Brain MRI: 1. Acute lacunar infarct at the left caudate head. 2. Generalized atrophy and extensive hemispheric small vessel disease without significant change compared to brain MRI 1 year ago. Intracranial MRA: 1. No emergent finding. No notable narrowing or luminal irregularity at the level of the left cavernous ICA. 2. High-grade vertebrobasilar stenoses with robust posterior communicating arteries. Electronically Signed   By: Monte Fantasia M.D.   On: 12/11/2019 06:36     Medications:   . amLODipine  5 mg Oral BID  . aspirin  300 mg Rectal Daily   Or  . aspirin  325 mg Oral Daily  . atorvastatin  40 mg Oral q1800  . carvedilol  25 mg Oral BID  . citalopram  20 mg Oral Daily  . clonazepam  0.25 mg Oral q morning - 10a   And  . clonazepam  0.5 mg Oral QPM  . clopidogrel  75 mg Oral Daily  . estradiol  0.5 mg Oral Daily  . heparin  5,000 Units Subcutaneous Q8H  . insulin aspart  0-5 Units Subcutaneous QHS  . insulin aspart  0-9 Units Subcutaneous TID WC  . sodium chloride flush  3 mL Intravenous Q12H  . traZODone  50 mg Oral QHS   Continuous Infusions: . sodium chloride       LOS: 0 days   Geradine Girt  [facility]   How to contact the [facility] Attending or Consulting provider [place] or covering provider during after hours [place], for this patient?  1. Check the care team in [facility] and look for a) attending/consulting [facility] provider listed and b) the [facility]  team listed 2. Log into [email or web address] and use [facility]'s universal password to access. If you do not have the password, please contact the hospital operator. 3. Locate the [facility] provider you are looking for under [facility] and page to a number that you can be directly reached. 4. If you still have difficulty reaching the provider, please page the [facility] (Director on Call) for the Hospitalists listed on amion for assistance.  12/11/2019, 8:58 AM

## 2019-12-11 NOTE — ED Notes (Signed)
Pt resting on cart in NAD. Breathing easy, non-labored. Hourly rounds completed. VSS. Call light within reach. Bed in lowest position, locked, with side rails up x2

## 2019-12-11 NOTE — Evaluation (Signed)
Occupational Therapy Evaluation Patient Details Name: Doris Carr MRN: [id number] DOB: [birth year] Today's Date: 12/11/2019    History of Present Illness 84 y.o. female ear after being told by her optometrist to present for left eye visual loss.  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  MRI positive for CVA, stroke work-up underway.   Clinical Impression   Pt admitted with the above diagnoses and presents with below problem list. Pt will benefit from continued acute OT to address the below listed deficits and maximize independence with basic ADLs prior to d/c home. Pt reports needing assist with basic ADLs, min guard with walker for functional mobility in the home. Spouse and [place] aides provide 24/7 supervision/assist. She reports she feels close to/at her baseline with ADLs. 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 Educated on functional impact of this and strategies for safety within the home and during ADLs/mobility. Plan to follow acutely to continue education on safety with visual field deficit and further assess safety with functional mobility.      Follow Up Recommendations  No OT follow up;Supervision/Assistance - 24 hour;Other (comment)(discussed f/u with optometrist and visual field testing)    Equipment Recommendations  None recommended by OT    Recommendations for Other Services PT consult     Precautions / Restrictions Precautions Precautions: Fall Restrictions Weight Bearing Restrictions: No      Mobility Bed Mobility                  Transfers Overall transfer level: Needs assistance Equipment used: Rolling walker (2 wheeled) Transfers: Sit to/from Stand Sit to Stand: Min guard;Min assist         General transfer comment: per pt and nurse report. appears to be at/close to baseline with transfers.    Balance Overall balance assessment: History of Falls                                          ADL either performed or assessed with clinical judgement   ADL Overall ADL's : Needs assistance/impaired Eating/Feeding: Set up;Sitting   Grooming: Minimal assistance;Set up;Sitting   Upper Body Bathing: Minimal assistance;Sitting   Lower Body Bathing: Moderate assistance;Sit to/from stand   Upper Body Dressing : Minimal assistance;Sitting   Lower Body Dressing: Moderate assistance;Sit to/from stand   Toilet Transfer: Min guard;Minimal assistance;Ambulation;RW;Comfort height toilet;Grab bars   Toileting- Clothing Manipulation and Hygiene: Minimal assistance;Sit to/from stand   Tub/ Shower Transfer: Walk-in shower;Min guard;Minimal assistance;Shower seat;Rolling walker;Grab bars;Ambulation   Functional mobility during ADLs: Min guard;Minimal assistance;Rolling walker General ADL Comments: Current mobility status per pt and nurse report. Pt reporting feeling very fatigued and ultimately declined EOB/OOB this session. Recently returned to bed from going to the bathroom. Educated on safety with functional mobility and ADLs with current visual field deficits. Discussed home setup in terms of safety with vision.     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 denies diplopia or blurriness. 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 but feels it is not functionally impacting her at this time  Perception     Praxis      Pertinent Vitals/Pain Pain Assessment: Faces Faces Pain Scale: No hurt     Hand Dominance     Extremity/Trunk Assessment Upper Extremity Assessment Upper Extremity Assessment:  Generalized weakness   Lower Extremity Assessment Lower Extremity Assessment: Defer to PT evaluation       Communication Communication Communication: No difficulties   Cognition Arousal/Alertness: Awake/alert Behavior During Therapy: WFL for tasks assessed/performed Overall Cognitive Status: Within Functional Limits for tasks assessed                                     General Comments       Exercises     Shoulder Instructions      Home Living Family/patient expects to be discharged to:: Private residence Living Arrangements: Spouse/significant other Available Help at Discharge: Family;Available 24 hours/day;Home health Type of Home: House Home Access: Stairs to enter [employer] of Steps: 1   Home Layout: One level     Bathroom Shower/Tub: Walk-in shower         Home Equipment: Environmental consultant - 2 wheels;Bedside commode;Grab bars - tub/shower          Prior Functioning/Environment Level of Independence: Needs assistance  Gait / Transfers Assistance Needed: short, household distances using rw and "my husband walks with me" ADL's / Homemaking Assistance Needed: spouse vs aides "help me with everything." assist with bathing/dressing   Comments: h/o falls        OT Problem List: Decreased activity tolerance;Impaired balance (sitting and/or standing);Decreased knowledge of use of DME or AE;Decreased knowledge of precautions;Impaired vision/perception      OT Treatment/Interventions: Self-care/ADL training;Visual/perceptual remediation/compensation;DME and/or AE instruction;Therapeutic activities;Balance training;Patient/family education    OT Goals(Current goals can be found in the care plan section) Acute Rehab OT Goals Patient Stated Goal: home and back to routine ADL Goals Pt Will Transfer to Toilet: with min guard assist;ambulating Pt Will Perform Toileting - Clothing Manipulation and hygiene: with min guard assist;sit to/from  stand Additional ADL Goal #1: Pt will navigate 3 obstacles during household distance functional mobility at min guard level with 100% accuracy.  OT Frequency: Min 1X/week   Barriers to D/C:            Co-evaluation              AM-PAC OT "6 Clicks" Daily Activity     Outcome Measure Help from another person eating meals?: None Help from another person taking care of personal grooming?: A Little Help from another person toileting, which includes using toliet, bedpan, or urinal?: A Little Help from another person bathing (including washing, rinsing, drying)?: A Little Help from another person to put on and taking off regular upper body clothing?: A Little Help from another person to put on and taking off regular lower body clothing?: A Little 6 Click Score: 19   End of Session    Activity Tolerance: Patient limited by fatigue Patient left: in bed;with call bell/phone within reach;with bed alarm set  OT Visit Diagnosis: Unsteadiness on feet (R26.81);Other (comment);Muscle weakness (generalized) (M62.81)(L eye visual field deficit)                Time: 1046-1100 OT Time Calculation (min): 14 min Charges:  OT General Charges $OT Visit: 1 Visit OT Evaluation $OT Eval Low Complexity: [place], OT Acute Rehabilitation Services Pager: [phone] Office: [phone]  Hortencia Pilar 12/11/2019, 11:30 AM

## 2019-12-11 NOTE — Progress Notes (Signed)
PT Progress Note for Charges    12/11/19 1627  PT General Charges  $$ ACUTE PT VISIT 1 Visit  PT Evaluation  $PT Eval Moderate Complexity 1 Mod  PT Treatments  $Gait Training 8-22 mins  Anastasio Champion, DPT  Acute Rehabilitation Services Pager [phone] Office [phone]

## 2019-12-11 NOTE — Progress Notes (Signed)
STROKE TEAM PROGRESS NOTE   INTERVAL HISTORY No family at the bedside.  Patient sitting in bed for breakfast.  She stated that she had a sudden onset flashing light in her left eye visual field and then had cloudy vision on upper field of her left eye.  She went to see her eye doctor and sent here for evaluation.  MRI showed left caudate head infarct.  CT head and neck showed bilateral ICA bulb stenosis with calcified plaques.  Recommend vascular surgery consultation.  Vitals:   12/11/19 0140 12/11/19 0455 12/11/19 0517 12/11/19 0719  BP: (!) 153/78 (!) 149/76 (!) 168/94 (!) 143/76  Pulse:  73 72   Resp: 13 18 16 20   Temp:   (!) 97.5 F (36.4 C) 97.7 F (36.5 C)  TempSrc:   Oral Oral  SpO2:  97% 94% 97%  Weight:      Height:        CBC:  Recent Labs  Lab 12/10/19 1815  WBC 6.3  NEUTROABS 3.8  HGB 13.5  HCT 38.5  MCV 97.0  PLT [id number]    Basic Metabolic Panel:  Recent Labs  Lab 12/10/19 1815 12/11/19 1029  NA 139 136  K 3.4* 3.2*  CL 96* 94*  CO2 28 29  GLUCOSE 164* 182*  BUN 39* 27*  CREATININE 1.33* 0.92  CALCIUM 9.7 9.0   Lipid Panel:     Component Value Date/Time   CHOL 165 12/11/2019 0612   CHOL 230 (H) 06/18/2019 1137   TRIG 168 (H) 12/11/2019 0612   HDL 59 12/11/2019 0612   HDL 59 06/18/2019 1137   CHOLHDL 2.8 12/11/2019 0612   VLDL 34 12/11/2019 0612   LDLCALC 72 12/11/2019 0612   LDLCALC 143 (H) 06/18/2019 1137   HgbA1c:  Lab Results  Component Value Date   HGBA1C 7.6 (H) 12/11/2019   Urine Drug Screen: No results found for: LABOPIA, COCAINSCRNUR, LABBENZ, AMPHETMU, THCU, LABBARB  Alcohol Level No results found for: ETH  IMAGING past 24 hours CT ANGIO HEAD W OR WO CONTRAST  Result Date: 12/11/2019 CLINICAL DATA:  Abnormal MRA EXAM: CT ANGIOGRAPHY HEAD AND NECK TECHNIQUE: Multidetector CT imaging of the head and neck was performed using the standard protocol during bolus administration of intravenous contrast. Multiplanar CT image  reconstructions and MIPs were obtained to evaluate the vascular anatomy. Carotid stenosis measurements (when applicable) are obtained utilizing NASCET criteria, using the distal internal carotid diameter as the denominator. CONTRAST:  75 mL OMNIPAQUE IOHEXOL 350 MG/ML SOLN COMPARISON:  MRA earlier same day FINDINGS: CTA NECK Aortic arch: Calcified and noncalcified plaque the aortic arch. Great vessel origins are patent. Right carotid system: Patent. There is calcified plaque at the ICA origin causing less than 50% stenosis. Left carotid system: Patent. There is calcified plaque at the ICA origin causing approximately 50% stenosis. Vertebral arteries: Patent. Left vertebral artery is slightly dominant. No significant stenosis. Skeleton: Multilevel degenerative changes, greatest at C5-C6 at C6-C7. Other neck: No mass or adenopathy. Upper chest: No apical lung mass. Review of the MIP images confirms the above findings CTA HEAD Anterior circulation: Intracranial internal carotid arteries are patent with calcified plaque causing mild stenosis. Middle and anterior cerebral arteries are patent. Left A1 ACA is dominant. Patent anterior communicating artery. Posterior circulation: Proximal intracranial right vertebral artery is patent. This either terminates as the PICA or becomes diminutive after PICA origin. There is probable plaque at the right PICA origin. Intracranial left vertebral artery is patent with plaque causing  moderate stenosis. Left PICA is patent. Basilar artery is patent atherosclerotic irregularity particularly proximally where there is moderate stenosis. There is short segment marked stenosis at the origin of the left superior cerebellar artery. Posterior cerebral arteries are patent. Mild stenosis of the proximal left P2 PCA. Bilateral posterior communicating arteries are present. Venous sinuses: As permitted by contrast timing, patent. Present on the prior MRI, there is a partially calcified 9 mm  meningioma along the left parietal convexity. Review of the MIP images confirms the above findings IMPRESSION: Plaque at the ICA origins causing less than 50% stenosis on the right and approximately 50% stenosis on the left. Posterior circulation intracranial atherosclerosis. Poorly visualized distal intracranial right vertebral artery, which may reflect plaque. Probable plaque at the right PICA origin. Moderate stenosis of the left vertebral and basilar arteries. Short segment marked stenosis at the left superior cerebellar artery origin. Mild stenosis of the proximal left P2 PCA. Electronically Signed   By: Macy Mis M.D.   On: 12/11/2019 10:58   CT HEAD WO CONTRAST  Result Date: 12/10/2019 CLINICAL DATA:  Vision loss EXAM: CT HEAD WITHOUT CONTRAST TECHNIQUE: Contiguous axial images were obtained from the base of the skull through the vertex without intravenous contrast. COMPARISON:  MRI 12/04/2018 FINDINGS: Brain: No acute territorial infarction, hemorrhage, or intracranial mass. Moderate atrophy. Moderate severe hypodensity within the white matter consistent with chronic small vessel ischemic change. Ventricles appear enlarged but similar as compared with 12/04/2018. Vascular: No hyperdense vessels.  Carotid vascular calcification Skull: Normal. Negative for fracture or focal lesion. Sinuses/Orbits: No acute finding. Other: Incomplete fusion posterior arch of C1 IMPRESSION: 1. No CT evidence for acute intracranial abnormality. 2. Atrophy and chronic small vessel ischemic change of the white matter Electronically Signed   By: Donavan Foil M.D.   On: 12/10/2019 18:13   CT ANGIO NECK W OR WO CONTRAST  Result Date: 12/11/2019 CLINICAL DATA:  Abnormal MRA EXAM: CT ANGIOGRAPHY HEAD AND NECK TECHNIQUE: Multidetector CT imaging of the head and neck was performed using the standard protocol during bolus administration of intravenous contrast. Multiplanar CT image reconstructions and MIPs were obtained to  evaluate the vascular anatomy. Carotid stenosis measurements (when applicable) are obtained utilizing NASCET criteria, using the distal internal carotid diameter as the denominator. CONTRAST:  75 mL OMNIPAQUE IOHEXOL 350 MG/ML SOLN COMPARISON:  MRA earlier same day FINDINGS: CTA NECK Aortic arch: Calcified and noncalcified plaque the aortic arch. Great vessel origins are patent. Right carotid system: Patent. There is calcified plaque at the ICA origin causing less than 50% stenosis. Left carotid system: Patent. There is calcified plaque at the ICA origin causing approximately 50% stenosis. Vertebral arteries: Patent. Left vertebral artery is slightly dominant. No significant stenosis. Skeleton: Multilevel degenerative changes, greatest at C5-C6 at C6-C7. Other neck: No mass or adenopathy. Upper chest: No apical lung mass. Review of the MIP images confirms the above findings CTA HEAD Anterior circulation: Intracranial internal carotid arteries are patent with calcified plaque causing mild stenosis. Middle and anterior cerebral arteries are patent. Left A1 ACA is dominant. Patent anterior communicating artery. Posterior circulation: Proximal intracranial right vertebral artery is patent. This either terminates as the PICA or becomes diminutive after PICA origin. There is probable plaque at the right PICA origin. Intracranial left vertebral artery is patent with plaque causing moderate stenosis. Left PICA is patent. Basilar artery is patent atherosclerotic irregularity particularly proximally where there is moderate stenosis. There is short segment marked stenosis at the origin of the left  superior cerebellar artery. Posterior cerebral arteries are patent. Mild stenosis of the proximal left P2 PCA. Bilateral posterior communicating arteries are present. Venous sinuses: As permitted by contrast timing, patent. Present on the prior MRI, there is a partially calcified 9 mm meningioma along the left parietal convexity.  Review of the MIP images confirms the above findings IMPRESSION: Plaque at the ICA origins causing less than 50% stenosis on the right and approximately 50% stenosis on the left. Posterior circulation intracranial atherosclerosis. Poorly visualized distal intracranial right vertebral artery, which may reflect plaque. Probable plaque at the right PICA origin. Moderate stenosis of the left vertebral and basilar arteries. Short segment marked stenosis at the left superior cerebellar artery origin. Mild stenosis of the proximal left P2 PCA. Electronically Signed   By: Macy Mis M.D.   On: 12/11/2019 10:58   MR ANGIO HEAD WO CONTRAST  Result Date: 12/11/2019 CLINICAL DATA:  Visual disturbance. Flashing in the left eye. Retinal artery occlusion by fundoscopy, per report EXAM: MRI HEAD WITHOUT CONTRAST MRA HEAD WITHOUT CONTRAST TECHNIQUE: Multiplanar, multiecho pulse sequences of the brain and surrounding structures were obtained without intravenous contrast. Angiographic images of the head were obtained using MRA technique without contrast. COMPARISON:  Head CT from yesterday and brain MRI from 12/04/2018 FINDINGS: MRI HEAD FINDINGS Brain: Subcentimeter acute infarct at the left caudate head. No optic pathway ischemia. Clear suprasellar cistern. Atrophy with ventriculomegaly. Medial temporal volume loss is at least moderate but congruous with the generalized volume loss. Chronic small vessel ischemia in the deep white matter which is moderate to extensive. No hemorrhage, obstructive hydrocephalus, or collection. No chronic blood products. Vascular: Arterial findings below. Normal dural venous sinus flow voids Skull and upper cervical spine: Negative for marrow lesion. There is upper cervical degenerative disease with C2-3 anterolisthesis. Sinuses/Orbits: Bilateral cataract resection. No evidence of intra-ocular collection or orbital inflammation. MRA HEAD FINDINGS Symmetric vertebral and carotid artery size.  Hypoplastic right A1 segment. Flow gap in the distal right vertebral artery from stenosis and likely accentuated by vessel tortuosity. There is also high-grade narrowing of the proximal basilar. Posterior circulation branch vessels are patent. Robust posterior communicating arteries. Hypoplastic right A1 segment. Negative for aneurysm or branch occlusion. Symmetric signal in the proximal ophthalmic arteries bilaterally. IMPRESSION: Brain MRI: 1. Acute lacunar infarct at the left caudate head. 2. Generalized atrophy and extensive hemispheric small vessel disease without significant change compared to brain MRI 1 year ago. Intracranial MRA: 1. No emergent finding. No notable narrowing or luminal irregularity at the level of the left cavernous ICA. 2. High-grade vertebrobasilar stenoses with robust posterior communicating arteries. Electronically Signed   By: Monte Fantasia M.D.   On: 12/11/2019 06:36   MR Angiogram Neck W or Wo Contrast  Result Date: 12/11/2019 CLINICAL DATA:  Visual changes in the left eye. ER referral for retinal artery occlusion EXAM: MRA NECK WITHOUT AND WITH CONTRAST TECHNIQUE: Multiplanar and multiecho pulse sequences of the neck were obtained without and with intravenous contrast. Angiographic images of the neck were obtained using MRA technique without and with intravenous contrast. CONTRAST:  6.8mL GADAVIST GADOBUTROL 1 MMOL/ML IV SOLN COMPARISON:  None. FINDINGS: Time-of-flight imaging shows antegrade flow in both carotid and vertebral arteries. This series is degraded by motion. Postcontrast imaging is complicated by patient activating squeeze ball during bolus injections such that images are early and late relative to the arterial phase. Based on source images and accounting for venous contamination: The arch is negative with 3 vessel branching. The  bilateral systems show tortuosity with asymmetric left-sided atherosclerotic plaque at the bifurcation leading the 60% luminal stenosis.  Plaque at the bulb is excavated, of indeterminate chronicity. No dissection is seen. No proximal subclavian stenosis. Vertebral artery assessment is notably limited by the degree of venous contamination, with no occlusion or flow limiting stenosis seen in the neck. There is occlusion of the distal right V4 segment and high-grade narrowing of the proximal basilar as described on dedicated intracranial MRA. IMPRESSION: 1. Limited study due to patient factors, as above. 2. Atherosclerosis with asymmetric involvement at the left carotid bifurcation where there is 60% luminal stenosis and excavated plaque which could relate to the patient's history. Electronically Signed   By: Monte Fantasia M.D.   On: 12/11/2019 07:18   MR BRAIN WO CONTRAST  Result Date: 12/11/2019 CLINICAL DATA:  Visual disturbance. Flashing in the left eye. Retinal artery occlusion by fundoscopy, per report EXAM: MRI HEAD WITHOUT CONTRAST MRA HEAD WITHOUT CONTRAST TECHNIQUE: Multiplanar, multiecho pulse sequences of the brain and surrounding structures were obtained without intravenous contrast. Angiographic images of the head were obtained using MRA technique without contrast. COMPARISON:  Head CT from yesterday and brain MRI from 12/04/2018 FINDINGS: MRI HEAD FINDINGS Brain: Subcentimeter acute infarct at the left caudate head. No optic pathway ischemia. Clear suprasellar cistern. Atrophy with ventriculomegaly. Medial temporal volume loss is at least moderate but congruous with the generalized volume loss. Chronic small vessel ischemia in the deep white matter which is moderate to extensive. No hemorrhage, obstructive hydrocephalus, or collection. No chronic blood products. Vascular: Arterial findings below. Normal dural venous sinus flow voids Skull and upper cervical spine: Negative for marrow lesion. There is upper cervical degenerative disease with C2-3 anterolisthesis. Sinuses/Orbits: Bilateral cataract resection. No evidence of  intra-ocular collection or orbital inflammation. MRA HEAD FINDINGS Symmetric vertebral and carotid artery size. Hypoplastic right A1 segment. Flow gap in the distal right vertebral artery from stenosis and likely accentuated by vessel tortuosity. There is also high-grade narrowing of the proximal basilar. Posterior circulation branch vessels are patent. Robust posterior communicating arteries. Hypoplastic right A1 segment. Negative for aneurysm or branch occlusion. Symmetric signal in the proximal ophthalmic arteries bilaterally. IMPRESSION: Brain MRI: 1. Acute lacunar infarct at the left caudate head. 2. Generalized atrophy and extensive hemispheric small vessel disease without significant change compared to brain MRI 1 year ago. Intracranial MRA: 1. No emergent finding. No notable narrowing or luminal irregularity at the level of the left cavernous ICA. 2. High-grade vertebrobasilar stenoses with robust posterior communicating arteries. Electronically Signed   By: Monte Fantasia M.D.   On: 12/11/2019 06:36   ECHOCARDIOGRAM COMPLETE  Result Date: 12/11/2019    ECHOCARDIOGRAM REPORT   Patient Name:   KEYIA SHAYNE Polinski Date of Exam: 12/11/2019 Medical Rec #:  [id number]    Height:       65.0 in Accession #:    [id number]   Weight:       140.0 lb Date of Birth:  [birth year]    BSA:          1.700 m Patient Age:    18 years     BP:           143/76 mmHg Patient Gender: F            HR:           67 bpm. Exam Location:  Inpatient Procedure: 2D Echo, Cardiac Doppler and Color Doppler Indications:    TIA 435.9 / G45.9  History:        Patient has prior history of Echocardiogram examinations, most                 recent 05/27/2011. CHF, Arrythmias:LBBB, Signs/Symptoms:Syncope;                 Risk Factors:Hypertension, Diabetes, Dyslipidemia and GERD. CKD.  Sonographer:    Jonelle Sidle Dance Referring Phys: [id number] Vandiver  1. Left ventricular ejection fraction, by estimation, is 55 to 60%. The left ventricle has  normal function. The left ventricle has no regional wall motion abnormalities. Left ventricular diastolic parameters are consistent with Grade I diastolic dysfunction (impaired relaxation).  2. Right ventricular systolic function is normal. The right ventricular size is normal.  3. The mitral valve is grossly normal. No evidence of mitral valve regurgitation.  4. The aortic valve is tricuspid. Aortic valve regurgitation is not visualized.  5. The inferior vena cava is normal in size with greater than 50% respiratory variability, suggesting right atrial pressure of 3 mmHg. FINDINGS  Left Ventricle: Left ventricular ejection fraction, by estimation, is 55 to 60%. The left ventricle has normal function. The left ventricle has no regional wall motion abnormalities. The left ventricular internal cavity size was normal in size. There is  no left ventricular hypertrophy. Left ventricular diastolic parameters are consistent with Grade I diastolic dysfunction (impaired relaxation). Indeterminate filling pressures. Right Ventricle: The right ventricular size is normal. No increase in right ventricular wall thickness. Right ventricular systolic function is normal. Left Atrium: Left atrial size was normal in size. Right Atrium: Right atrial size was normal in size. Pericardium: There is no evidence of pericardial effusion. Presence of pericardial fat pad. Mitral Valve: The mitral valve is grossly normal. No evidence of mitral valve regurgitation. Tricuspid Valve: The tricuspid valve is grossly normal. Tricuspid valve regurgitation is not demonstrated. Aortic Valve: The aortic valve is tricuspid. Aortic valve regurgitation is not visualized. Pulmonic Valve: The pulmonic valve was grossly normal. Pulmonic valve regurgitation is not visualized. Aorta: The aortic root, ascending aorta, aortic arch and descending aorta are all structurally normal, with no evidence of dilitation or obstruction. Venous: The inferior vena cava is  normal in size with greater than 50% respiratory variability, suggesting right atrial pressure of 3 mmHg. IAS/Shunts: The interatrial septum was not well visualized.  LEFT VENTRICLE PLAX 2D LVIDd:         4.90 cm  Diastology LVIDs:         3.50 cm  LV e' lateral:   3.81 cm/s LV PW:         1.40 cm  LV E/e' lateral: 1.1 LV IVS:        0.70 cm LVOT diam:     2.10 cm LV SV:         82 LV SV Index:   48 LVOT Area:     3.46 cm  RIGHT VENTRICLE             IVC RV Basal diam:  1.90 cm     IVC diam: 2.00 cm RV S prime:     10.10 cm/s TAPSE (M-mode): 1.8 cm LEFT ATRIUM         Index LA diam:    3.10 cm 1.82 cm/m  AORTIC VALVE LVOT Vmax:   101.00 cm/s LVOT Vmean:  67.600 cm/s LVOT VTI:    0.238 m  AORTA Ao Root diam: 3.50 cm Ao Asc diam:  2.80 cm MV E velocity: 4.03  cm/s MV A velocity: 98.05 cm/s  SHUNTS MV E/A ratio:  0.04        Systemic VTI:  0.24 m                            Systemic Diam: 2.10 cm Lyman Bishop MD Electronically signed by Lyman Bishop MD Signature Date/Time: 12/11/2019/10:57:01 AM    Final     PHYSICAL EXAM  Temp:  [97.5 F (36.4 C)-98.1 F (36.7 C)] 98.1 F (36.7 C) (03/10 1223) Pulse Rate:  [70-77] 77 (03/10 1223) Resp:  [11-20] 18 (03/10 1223) BP: (113-168)/(67-95) 113/67 (03/10 1223) SpO2:  [94 %-100 %] 97 % (03/10 1223) Weight:  [63.5 kg] 63.5 kg (03/09 1636)  General - Well nourished, well developed, in no apparent distress.  Ophthalmologic - fundi not visualized due to noncooperation.  Cardiovascular - Regular rhythm and rate.  Mental Status -  Level of arousal and orientation to time, place, and person were intact. Language including expression, naming, repetition, comprehension was assessed and found intact. Fund of Knowledge was assessed and was intact.  Cranial Nerves II - XII - II - Visual field intact OU.  Subjective left eye upper field cloudiness. III, IV, VI - Extraocular movements intact. V - Facial sensation intact bilaterally. VII - Facial movement intact  bilaterally. VIII - Hearing & vestibular intact bilaterally. X - Palate elevates symmetrically. XI - Chin turning & shoulder shrug intact bilaterally. XII - Tongue protrusion intact.  Motor Strength - The patient's strength was symmetrical in all extremities and pronator drift was absent.  Bulk was normal and fasciculations were absent.   Motor Tone - Muscle tone was assessed at the neck and appendages and was normal.  Reflexes - The patient's reflexes were symmetrical in all extremities and she had no pathological reflexes.  Sensory - Light touch, temperature/pinprick were assessed and were symmetrical.    Coordination - The patient had normal movements in the hands with no ataxia or dysmetria.  Tremor was absent.  Gait and Station - deferred.   ASSESSMENT/PLAN Ms. KAYLIAH TUFFY is a 84 y.o. female with history of back pain, arthritis, depression, diabetes, diastolic CHF, hyperlipidemia, hypertension, prior history of syncope, right knee pain status post total knee replacement presenting from optometrist office with L eye vision loss w/ possible L CRAO.  L BRAO  Diagnosed by ophthalmology  Likely related to left carotid stenosis and atherosclerosis  Vascular surgery consultation recommended  Continue follow-up with ophthalmology.  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 Small vessel disease. Atrophy.   MRI  L caudate head infarct. Small vessel disease. Atrophy.   MRA head  No ELVO. High-grade VBJ stenoses.  MRA neck L ICA bifurcation 60% stenosis and excavated plaque  CTA head & neck ICA origin stenosis R < 50%, L 50%. Posterior circulation atherosclerosis - probable plaque R PICA origin, moderate L VA and BA stenosis, short market L superior cerebellar origin stenosis, L PCA P2 mild stenosis.    2D Echo EF 55-60%. No source of embolus   LDL 72  HgbA1c 7.6  Heparin 5000 units sq tid for VTE  prophylaxis  clopidogrel 75 mg daily prior to admission, now on aspirin 325 mg daily and clopidogrel 75 mg daily.   Therapy recommendations: none   Disposition:  pending    Carotid stenosis  MRA neck left ICA bifurcation 60% stenosis with  calcified plaque  CTA head and neck ICA origin left 50% stenosis, right < 50% stenosis  Likely source of left caudate head infarct and left BRAO  Vascular surgery consultation recommended  Hypertension  Stable . Permissive hypertension (OK if < 220/120) but gradually normalize in 2-3 days . Long-term BP goal normotensive  Hyperlipidemia  Home meds:  repatha (statin ineffective)  Placed on lipitor 40 on admission - will d/c  LDL 72, goal < 70  Continue home repatha at discharge. No indication to continue statin.  Diabetes type II Uncontrolled  HgbA1c 7.6, goal < 7.0  CBGs  SSI  Close PCP follow-up  Other Stroke Risk Factors  Advanced age  Former Cigarette smoker, quit 35 yrs ago  Chronic diastolic Congestive heart failure  PVD  Estrace 0.5 daily, resumed in hospital - will discuss w/ pt   Other Active Problems  Renal insufficiency, creatinine 1.33, IV fluid for 12 hours after CTA head and neck  Urinary retention   Hospital day # 0  Rosalin Hawking, MD PhD Stroke Neurology 12/11/2019 4:38 PM   To contact Stroke Continuity provider, please refer to [email or web address] After hours, contact General Neurology

## 2019-12-11 NOTE — ED Notes (Signed)
Report given to Van, Therapist, sports. All questions answered.

## 2019-12-11 NOTE — ED Notes (Signed)
Pt taken to MRI in NAD

## 2019-12-11 NOTE — Progress Notes (Signed)
PT Cancellation Note  Patient Details Name: Doris Carr MRN: [id number] DOB: 10/19/1935   Cancelled Treatment:    Reason Eval/Treat Not Completed: Patient at procedure or test/unavailable (bedside echo). PT will continue to f/u with pt acutely as available.    Clearnce Sorrel Aceton Kinnear 12/11/2019, 9:23 AM

## 2019-12-11 NOTE — Consult Note (Addendum)
[facility] NOTE   MRN : [id number]  Reason for Consult: CVA with amaurosis and CTA evidence of B carotid stenosis Referring Physician: Dr. Lucianne Lei  History of Present Illness: 84 y/o female was seen recently by her optometrist for left eye vision loss she was referred to the ED.  MRI confirmed an Acute lacunar infarct at the left caudate head.  We have been asked to evaluate her for carotid stenosis after CTA findings of B Carotid stenosis of 50%.    She denise history of stroke or TIA.  No weakness, aphasia or dysphasia symptoms.  She does have a history of PAD with left iliac stent and right SFA stent and a history of carotid stenosis.She is followed by Dr. Gwenlyn Found.  Past medical history includes: DM, right TKA with abnormal ambulation/balance, anxiety, CKD and HTN.        Current Facility-Administered Medications  Medication Dose Route Frequency Provider Last Rate Last Admin  . 0.9 %  sodium chloride infusion  250 mL Intravenous PRN Rosalin Hawking, MD      . acetaminophen (TYLENOL) tablet 650 mg  650 mg Oral Q4H PRN Opyd, Doris Qua, MD   650 mg at 12/11/19 1239   Or  . acetaminophen (TYLENOL) 160 MG/5ML solution 650 mg  650 mg Per Tube Q4H PRN Opyd, Doris Qua, MD       Or  . acetaminophen (TYLENOL) suppository 650 mg  650 mg Rectal Q4H PRN Opyd, Doris Qua, MD      . amLODipine (NORVASC) tablet 5 mg  5 mg Oral BID Opyd, Doris Qua, MD   5 mg at 12/11/19 0843  . aspirin suppository 300 mg  300 mg Rectal Daily Opyd, Doris Qua, MD   Stopped at 12/10/19 2126   Or  . aspirin tablet 325 mg  325 mg Oral Daily Opyd, Doris Qua, MD   325 mg at 12/11/19 0842  . carvedilol (COREG) tablet 25 mg  25 mg Oral BID Vianne Bulls, MD   25 mg at 12/11/19 0842  . citalopram (CELEXA) tablet 20 mg  20 mg Oral Daily Opyd, Doris Qua, MD   20 mg at 12/11/19 0843  . clonazePAM (KLONOPIN) disintegrating tablet 0.25 mg  0.25 mg Oral q morning - 10a Opyd, Doris Qua, MD   0.25 mg at  12/11/19 0843   And  . clonazePAM (KLONOPIN) disintegrating tablet 0.5 mg  0.5 mg Oral QPM Opyd, Doris Qua, MD      . clopidogrel (PLAVIX) tablet 75 mg  75 mg Oral Daily Rosalin Hawking, MD   75 mg at 12/11/19 0843  . estradiol (ESTRACE) tablet 0.5 mg  0.5 mg Oral Daily Opyd, Doris Qua, MD   0.5 mg at 12/11/19 0842  . heparin injection 5,000 Units  5,000 Units Subcutaneous Q8H Opyd, Doris Qua, MD   5,000 Units at 12/11/19 1234  . HYDROcodone-acetaminophen (NORCO/VICODIN) 5-325 MG per tablet 1 tablet  1 tablet Oral Q6H PRN Opyd, Doris Qua, MD   1 tablet at 12/10/19 2145  . insulin aspart (novoLOG) injection 0-5 Units  0-5 Units Subcutaneous QHS Opyd, Doris Qua, MD   Stopped at 12/10/19 2145  . insulin aspart (novoLOG) injection 0-9 Units  0-9 Units Subcutaneous TID WC Opyd, Doris Qua, MD   2 Units at 12/11/19 1127  . ondansetron (ZOFRAN) injection 4 mg  4 mg Intravenous Q6H PRN Opyd, Doris Qua, MD      . senna-docusate (Senokot-S) tablet 1 tablet  1 tablet Oral QHS PRN Opyd, Doris Qua, MD      . sodium chloride flush (NS) 0.9 % injection 3 mL  3 mL Intravenous Q12H Opyd, Doris Qua, MD   3 mL at 12/11/19 0842  . sodium chloride flush (NS) 0.9 % injection 3 mL  3 mL Intravenous PRN Opyd, Doris Qua, MD      . traZODone (DESYREL) tablet 50 mg  50 mg Oral QHS Opyd, Doris Qua, MD   50 mg at 12/10/19 2142    Pt meds include: Statin :No Betablocker: Yes ASA: Yes Other anticoagulants/antiplatelets: Plavix  Past Medical History:  Diagnosis Date  . Anxiety   . Arthritis    osteoarthritis  . Back pain    sees a neurosurgeon  . Balance problems   . Balance problems   . Chronic diastolic heart failure (HCC)    Echo (8/12) with EF 60-65%, moderate LV hypertrophy, mild LVH  . Chronic renal insufficiency, stage III (moderate)   . 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 CKD (chronic kidney disease)    likely due to DM2 and HTN  . Current tear of meniscus right knee  .  Depression   . Diabetes mellitus    type  2  . Diastolic CHF, chronic ([place]) 06/12/2011  . Fecal incontinence 05/14/2013  . GERD (gastroesophageal reflux disease)   . History of hiatal hernia    inguinal hernia  . History of total knee arthroplasty, right 11/16/2016  . HLD (hyperlipidemia)    myalgias with statins  . Hyperlipidemia 09/14/2011  . Hypertension    This has been difficult to control. She has had renal artery doppler evaluation on two different occasions, both times with no evidence for renal artery stenosis. She has had urinary catecholeamine collection that was unremarkable. Edema with 10 mg amlodipine.   . Left bundle branch block   . Leg pain 05/14/2011  . Lower extremity edema    CHRONIC  . Lung nodule    VATS and biopsy in 2006 that was negative for malignancy  . Obesity   . Peripheral vascular disease ([place])    Lower extremity evaluation (12/12): ABI 0.79 right, 0.64 left; > 50% mid left SFA stenosis;  s/p L SFA stent and stent to R CIA 11/2011; ABI's post PTA - R 0.90, L 0.96 (TBI on R 0.51, L 0.61)  . Pneumonia    multiple times in childhood  . PONV (postoperative nausea and vomiting)   . Pre-operative cardiovascular examination 02/03/2016  . S/P cardiac catheterization    Left heart cath in 6/06 with normal coronaries, EF 60%.  . Syncope and collapse     Past Surgical History:  Procedure Laterality Date  . ABDOMINAL AORTAGRAM N/A 11/09/2011   Procedure: ABDOMINAL Maxcine Ham;  Surgeon: Sherren Mocha, MD;  [facility];  Service: Cardiovascular;  Laterality: N/A;  . abdominal aortogram  11/09/2011  . ANAL RECTAL MANOMETRY N/A 05/08/2013   Procedure: ANAL RECTAL MANOMETRY;  Surgeon: Leighton Ruff, MD;  Location: [facility] ENDOSCOPY;  Service: Endoscopy;  Laterality: N/A;  . CARDIAC CATHETERIZATION  2006   NORMAL CORONARIES  . CATARACT EXTRACTION    . CERVICAL CONE BIOPSY    . FLEXIBLE SIGMOIDOSCOPY N/A 05/08/2013   Procedure: FLEXIBLE SIGMOIDOSCOPY;  Surgeon: Leighton Ruff, MD;  Location: [facility] ENDOSCOPY;  Service: Endoscopy;  Laterality: N/A;  rigid proctoscope get from o.r.  . GYNECOLOGIC CRYOSURGERY    . Knee Surg  Arthroscopic  . RECTAL ULTRASOUND N/A 05/08/2013   Procedure: RECTAL ULTRASOUND;  Surgeon: Leighton Ruff, MD;  Location: [facility] ENDOSCOPY;  Service: Endoscopy;  Laterality: N/A;  . Stints in leg arteries    . THORACOTOMY    . THYMIC CYST REMOVAL    . TONSILLECTOMY    . TOTAL KNEE ARTHROPLASTY Right 11/16/2016   Procedure: RIGHT TOTAL KNEE ARTHROPLASTY;  Surgeon: Latanya Maudlin, MD;  Location: [facility] ORS;  Service: Orthopedics;  Laterality: Right;  block  . VAGINAL HYSTERECTOMY  1985   WITH A/P REPAIR    Social History Social History   Tobacco Use  . Smoking status: Former Smoker    Packs/day: 0.80    Years: 15.00    Pack years: 12.00    Types: Cigarettes    Quit date: 11/03/1984    Years since quitting: 35.1  . Smokeless tobacco: Never Used  Substance Use Topics  . Alcohol use: No    Alcohol/week: 0.0 standard drinks  . Drug use: No    Family History Family History  Problem Relation Age of Onset  . Alzheimer's disease Mother   . Hypertension Mother   . Heart disease Mother   . Alzheimer's disease Father   . Diabetes Paternal Grandfather     Allergies  Allergen Reactions  . Ciprofloxacin Nausea And Vomiting  . Codeine Nausea And Vomiting  . Darvocet [Propoxyphene N-Acetaminophen] Nausea Only  . Darvon Nausea Only  . Hydralazine Other (See Comments)    Unsure of reaction  . Hydrocodone Nausea And Vomiting  . Morphine And Related Nausea And Vomiting  . Pentazocine Lactate Nausea And Vomiting  . Potassium-Containing Compounds Nausea Only  . Procaine Hcl Nausea And Vomiting  . Sulfa Drugs Cross Reactors Rash     REVIEW OF SYSTEMS  General: [ ]  Weight loss, [ ]  Fever, [ ]  chills Neurologic: [ ]  Dizziness, [ ]  Blackouts, [ ]  Seizure [ ]  Stroke, [ ]  "Mini stroke", [ ]  Slurred speech, [x ] Temporary blindness; [ ]   weakness in arms or legs, [ ]  Hoarseness [ ]  Dysphagia Cardiac: [ ]  Chest pain/pressure, [ ]  Shortness of breath at rest [ ]  Shortness of breath with exertion, [ ]  Atrial fibrillation or irregular heartbeat  Vascular: [ ]  Pain in legs with walking, [ ]  Pain in legs at rest, [ ]  Pain in legs at night,  [ ]  Non-healing ulcer, [ ]  Blood clot in vein/DVT,   Pulmonary: [ ]  Home oxygen, [ ]  Productive cough, [ ]  Coughing up blood, [ ]  Asthma,  [ ]  Wheezing [ ]  COPD Musculoskeletal:  [x ] Arthritis, [ ]  Low back pain, [ ]  Joint pain Hematologic: [ ]  Easy Bruising, [ ]  Anemia; [ ]  Hepatitis Gastrointestinal: [ ]  Blood in stool, [ ]  Gastroesophageal Reflux/heartburn, Urinary: [x ] chronic Kidney disease, [ ]  on HD - [ ]  MWF or [ ]  TTHS, [ ]  Burning with urination, [ ]  Difficulty urinating Skin: [ ]  Rashes, [ ]  Wounds Psychological: x[ ]  Anxiety, [ ]  Depression  Physical Examination Vitals:   12/11/19 0517 12/11/19 0719 12/11/19 1200 12/11/19 1223  BP: (!) 168/94 (!) 143/76 113/67 113/67  Pulse: 72   77  Resp: 16 20  18   Temp: (!) 97.5 F (36.4 C) 97.7 F (36.5 C) 98.1 F (36.7 C) 98.1 F (36.7 C)  TempSrc: Oral Oral Oral Oral  SpO2: 94% 97%  97%  Weight:      Height:       Body mass index  is 23.3 kg/m.  General:  WDWN in NAD Gait: walks with walker HENT: WNL Eyes: Pupils equal, superior field of vision spots Pulmonary: normal non-labored breathing , without Rales, rhonchi,  wheezing Cardiac: RRR, without  Murmurs, rubs or gallops; No carotid bruits Abdomen: soft, NT, no masses Skin: no rashes, ulcers noted;  no Gangrene , no cellulitis; no open wounds;   Vascular Exam/Pulses:radial, femoral, feet warm and well perfused   Musculoskeletal: no muscle wasting or atrophy; no edema  Neurologic: A&O X 3; Appropriate Affect ;  SENSATION: normal; MOTOR FUNCTION: 5/5 Symmetric Speech is fluent/normal   Significant Diagnostic Studies: CBC Lab Results  Component Value Date   WBC  6.3 12/10/2019   HGB 13.5 12/10/2019   HCT 38.5 12/10/2019   MCV 97.0 12/10/2019   PLT 161 12/10/2019    BMET    Component Value Date/Time   NA 136 12/11/2019 1029   K 3.2 (L) 12/11/2019 1029   CL 94 (L) 12/11/2019 1029   CO2 29 12/11/2019 1029   GLUCOSE 182 (H) 12/11/2019 1029   BUN 27 (H) 12/11/2019 1029   CREATININE 0.92 12/11/2019 1029   CREATININE 1.47 (H) 05/06/2016 1535   CALCIUM 9.0 12/11/2019 1029   GFRNONAA 58 (L) 12/11/2019 1029   GFRAA >60 12/11/2019 1029   Estimated Creatinine Clearance: 43.2 mL/min (by C-G formula based on SCr of 0.92 mg/dL).  COAG Lab Results  Component Value Date   INR 0.9 12/10/2019   INR 0.99 11/08/2016   INR 1.0 10/28/2011     Non-Invasive Vascular Imaging:  FINDINGS: CTA NECK  Aortic arch: Calcified and noncalcified plaque the aortic arch. Great vessel origins are patent.  Right carotid system: Patent. There is calcified plaque at the ICA origin causing less than 50% stenosis.  Left carotid system: Patent. There is calcified plaque at the ICA origin causing approximately 50% stenosis.  Vertebral arteries: Patent. Left vertebral artery is slightly dominant. No significant stenosis  FINDINGS: MRI HEAD FINDINGS  Brain: Subcentimeter acute infarct at the left caudate head. No optic pathway ischemia. Clear suprasellar cistern.  Atrophy with ventriculomegaly. Medial temporal volume loss is at least moderate but congruous with the generalized volume loss. Chronic small vessel ischemia in the deep white matter which is moderate to extensive. No hemorrhage, obstructive hydrocephalus, or collection. No chronic blood products.  Vascular: Arterial findings below. Normal dural venous sinus flow voids  Skull and upper cervical spine: Negative for marrow lesion. There is upper cervical degenerative disease with C2-3 anterolisthesis.  Sinuses/Orbits: Bilateral cataract resection. No evidence of intra-ocular collection  or orbital inflammation.  MRA HEAD FINDINGS  Symmetric vertebral and carotid artery size. Hypoplastic right A1 segment.  Flow gap in the distal right vertebral artery from stenosis and likely accentuated by vessel tortuosity. There is also high-grade narrowing of the proximal basilar. Posterior circulation branch vessels are patent. Robust posterior communicating arteries.  Hypoplastic right A1 segment. Negative for aneurysm or branch occlusion. Symmetric signal in the proximal ophthalmic arteries bilaterally.  IMPRESSION: Brain MRI:  1. Acute lacunar infarct at the left caudate head. 2. Generalized atrophy and extensive hemispheric small vessel disease without significant change compared to brain MRI 1 year ago.  Intracranial MRA:  1. No emergent finding. No notable narrowing or luminal irregularity at the level of the left cavernous ICA. 2. High-grade vertebrobasilar stenoses with robust posterior communicating arteries.   03/19/19 Carotid duplex     Right Carotid Findings:  +----------+--------+--------+--------+-------------------------+--------+       PSV cm/sEDV cm/sStenosisDescribe  Comments  +----------+--------+--------+--------+-------------------------+--------+  CCA Prox 52   6                     tortuous  +----------+--------+--------+--------+-------------------------+--------+  CCA Distal48   10                          +----------+--------+--------+--------+-------------------------+--------+  ICA Prox 42   8        heterogenous and calcific      +----------+--------+--------+--------+-------------------------+--------+  ICA Mid  52   13   1-39%                     +----------+--------+--------+--------+-------------------------+--------+  ICA Distal45   9                            +----------+--------+--------+--------+-------------------------+--------+  ECA    94   8        heterogenous and calcific      +----------+--------+--------+--------+-------------------------+--------+   +----------+--------+-------+----------------+-------------------+       PSV cm/sEDV cmsDescribe    Arm Pressure (mmHG)  +----------+--------+-------+----------------+-------------------+  [id number]       Multiphasic, [id number]          +----------+--------+-------+----------------+-------------------+   +---------+--------+--+--------+-+--------------+  VertebralPSV cm/s29EDV cm/s5High resistant  +---------+--------+--+--------+-+--------------+     Left Carotid Findings:  +----------+--------+--------+--------+-------------------------+---------+        PSV cm/sEDV cm/sStenosisDescribe         Comments    +----------+--------+--------+--------+-------------------------+---------+   CCA Prox 84   13                    tortuous    +----------+--------+--------+--------+-------------------------+---------+   CCA Distal61   11                            +----------+--------+--------+--------+-------------------------+---------+   ICA Prox 204   56   40-59% heterogenous and  calcificShadowing  +----------+--------+--------+--------+-------------------------+---------+   ICA Mid  47   13                            +----------+--------+--------+--------+-------------------------+---------+   ICA Distal36   11                    tortuous    +----------+--------+--------+--------+-------------------------+---------+   ECA    101   8        heterogenous and calcific         +----------+--------+--------+--------+-------------------------+---------+    +----------+--------+--------+----------------+-------------------+  SubclavianPSV cm/sEDV cm/sDescribe    Arm Pressure (mmHG)  +----------+--------+--------+----------------+-------------------+       108       Multiphasic, [id number]          +----------+--------+--------+----------------+-------------------+   +---------+--------+--+--------+-+---------+  VertebralPSV cm/s36EDV cm/s8Antegrade  +---------+--------+--+--------+-+---------+     Summary:  Right Carotid: Velocities in the right ICA are consistent with a 1-39%  stenosis.           Essentially unchanged from prior exam.   Left Carotid: Velocities in the left ICA are consistent with a 40-59%  stenosis.      ASSESSMENT/PLAN:  Luinfarct at the left caudate head. Amaurosis left eye with vision regained 85% B carotid stenosis 50% on CTA  She has no history of stroke or TIA.  With sudden vision loss and carotid stenosis she will benefit from left CEA.    Roxy Horseman 12/11/2019 3:33  PM   I have examined the patient, reviewed and agree with above.  Very active pleasant 84 year old with episode of left eye retinal embolus.  MRI also showed evidence of left caudate head infarct.  No motor or sensory loss.  No prior history of any of these events.  No aphasia.  She is right-handed.  CT angiogram was reviewed and discussed with the patient.  This does show extremely calcified irregular left carotid bifurcation plaque.  She does not have any plaque at the origin of her left common carotid.  She does have plaque in the arch of the aorta distal.  Her internal carotid is normal above the bifurcation.  I explained that this is the most likely cause of both her retinal embolus and left brain infarct.  Have recommended left carotid endarterectomy for reduction of stroke risk.  I described the  procedure in detail and the expected 1 to 2% risk of stroke with surgery.  She understands and wished to proceed.  She understands that this is not a urgent surgery.  Would prefer to have this addressed while inpatient.  She has not had a major deficit so I feel that this is safe.  There is no time on the operative schedule tomorrow.  We will plan for left carotid endarterectomy on Friday, 12/13/2019  Curt Jews, MD 12/11/2019 7:34 PM

## 2019-12-11 NOTE — ED Notes (Signed)
Pt arrived back from MRI and transported to Rm 5C16 in NAD with all belongings. Pt a&ox4. Speaking in full sentences. Breathing easy, non-labored. Neuro status stable and unchanged from baseline. All belongings sent to pt

## 2019-12-11 NOTE — ED Notes (Signed)
Pt wheeled to and from [place] without incidence

## 2019-12-11 NOTE — Progress Notes (Signed)
SLP Cancellation Note  Patient Details Name: KYNSEY SUSKO MRN: [id number] DOB: 1936-08-28   Cancelled treatment:       Reason Eval/Treat Not Completed: Patient at procedure or test/unavailable(Pt having echo completed at this time. SLP will follow up)  Mckenzee Beem I. Hardin Negus, [place], [place] Office number [phone] Pager [place] 12/11/2019, 9:35 AM

## 2019-12-11 NOTE — Evaluation (Signed)
Speech Language Pathology Evaluation Patient Details Name: Doris Carr MRN: [id number] DOB: [birth year] Today's Date: 12/11/2019 Time: [id number] SLP Time Calculation (min) (ACUTE ONLY): 19 min  Problem List:  Patient Active Problem List   Diagnosis Date Noted  . Acute ischemic stroke ([place]) 12/11/2019  . Acute CVA (cerebrovascular accident) ([place]) 12/11/2019  . Transient vision disturbance 12/10/2019  . Renal insufficiency 12/10/2019  . Gait abnormality 11/20/2018  . Weakness 11/20/2018  . Right carotid bruit 04/17/2018  . History of total knee arthroplasty, right 11/16/2016  . Pre-operative cardiovascular examination 02/03/2016  . Normal coronary arteries  02/03/2016  . Fecal incontinence 05/14/2013  . PVD (peripheral vascular disease) ([place]) 09/14/2011  . Hyperlipidemia 09/14/2011  . Diastolic CHF, chronic ([place]) 06/12/2011  . Leg pain 05/14/2011  . HTN (hypertension) 01/10/2011  . Balance problems   . Diabetes mellitus with nephropathy ([place])   . Syncope and collapse   . Left bundle branch block   . Chronic renal insufficiency, stage III (moderate)   . Lower extremity edema   . Depression   . Anxiety   . Obesity    Past Medical History:  Past Medical History:  Diagnosis Date  . Anxiety   . Arthritis    osteoarthritis  . Back pain    sees a neurosurgeon  . Balance problems   . Balance problems   . Chronic diastolic heart failure (HCC)    Echo (8/12) with EF 60-65%, moderate LV hypertrophy, mild LVH  . Chronic renal insufficiency, stage III (moderate)   . 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 CKD (chronic kidney disease)    likely due to DM2 and HTN  . Current tear of meniscus right knee  . Depression   . Diabetes mellitus    type  2  . Diastolic CHF, chronic ([place]) 06/12/2011  . Fecal incontinence 05/14/2013  . GERD (gastroesophageal reflux disease)   . History of hiatal hernia    inguinal hernia  . History of total knee arthroplasty,  right 11/16/2016  . HLD (hyperlipidemia)    myalgias with statins  . Hyperlipidemia 09/14/2011  . Hypertension    This has been difficult to control. She has had renal artery doppler evaluation on two different occasions, both times with no evidence for renal artery stenosis. She has had urinary catecholeamine collection that was unremarkable. Edema with 10 mg amlodipine.   . Left bundle branch block   . Leg pain 05/14/2011  . Lower extremity edema    CHRONIC  . Lung nodule    VATS and biopsy in 2006 that was negative for malignancy  . Obesity   . Peripheral vascular disease ([place])    Lower extremity evaluation (12/12): ABI 0.79 right, 0.64 left; > 50% mid left SFA stenosis;  s/p L SFA stent and stent to R CIA 11/2011; ABI's post PTA - R 0.90, L 0.96 (TBI on R 0.51, L 0.61)  . Pneumonia    multiple times in childhood  . PONV (postoperative nausea and vomiting)   . Pre-operative cardiovascular examination 02/03/2016  . S/P cardiac catheterization    Left heart cath in 6/06 with normal coronaries, EF 60%.  . Syncope and collapse    Past Surgical History:  Past Surgical History:  Procedure Laterality Date  . ABDOMINAL AORTAGRAM N/A 11/09/2011   Procedure: ABDOMINAL Maxcine Ham;  Surgeon: Sherren Mocha, MD;  [facility];  Service: Cardiovascular;  Laterality: N/A;  . abdominal aortogram  11/09/2011  .  ANAL RECTAL MANOMETRY N/A 05/08/2013   Procedure: ANAL RECTAL MANOMETRY;  Surgeon: Leighton Ruff, MD;  Location: [facility] ENDOSCOPY;  Service: Endoscopy;  Laterality: N/A;  . CARDIAC CATHETERIZATION  2006   NORMAL CORONARIES  . CATARACT EXTRACTION    . CERVICAL CONE BIOPSY    . FLEXIBLE SIGMOIDOSCOPY N/A 05/08/2013   Procedure: FLEXIBLE SIGMOIDOSCOPY;  Surgeon: Leighton Ruff, MD;  Location: [facility] ENDOSCOPY;  Service: Endoscopy;  Laterality: N/A;  rigid proctoscope get from o.r.  . GYNECOLOGIC CRYOSURGERY    . Knee Surg     Arthroscopic  . RECTAL ULTRASOUND N/A 05/08/2013   Procedure: RECTAL  ULTRASOUND;  Surgeon: Leighton Ruff, MD;  Location: [facility] ENDOSCOPY;  Service: Endoscopy;  Laterality: N/A;  . Stints in leg arteries    . THORACOTOMY    . THYMIC CYST REMOVAL    . TONSILLECTOMY    . TOTAL KNEE ARTHROPLASTY Right 11/16/2016   Procedure: RIGHT TOTAL KNEE ARTHROPLASTY;  Surgeon: Latanya Maudlin, MD;  Location: [facility] ORS;  Service: Orthopedics;  Laterality: Right;  block  . VAGINAL HYSTERECTOMY  1985   WITH A/P REPAIR   HPI:  Pt is an 84 y.o. female with medical history significant for anxiety, insomnia, chronic diastolic CHF, and osteoarthritis, who presented to the emergency department for evaluation of visual disturbance. MRI of the brain showed acute lacunar infarct at the left caudate head.   Assessment / Plan / Recommendation Clinical Impression  Pt participated in speech/language evaluation and she reported that she is a retired Automotive engineer and college professor. She denied any acute changes but stated that she has been having some difficulty with memory for "close to a year" for which she uses external memory aids. Her speech and language skills are currently within normal limits. Her cognitive-linguistic skills were within functional limits with some difficulty with memory. Further skilled SLP services are not clinically indicated at this time. Pt and nursing were educated regarding results and recommendations; both parties verbalized understanding as well as agreement with plan of care.    SLP Assessment  SLP Recommendation/Assessment: Patient does not need any further Speech Lanaguage Pathology Services SLP Visit Diagnosis: Cognitive communication deficit (R41.841)    Follow Up Recommendations  None    Frequency and Duration           SLP Evaluation Cognition  Overall Cognitive Status: Within Functional Limits for tasks assessed Arousal/Alertness: Awake/alert Orientation Level: Oriented X4 Attention: Sustained;Focused Focused Attention: Appears  intact Sustained Attention: Appears intact Memory: Impaired Memory Impairment: (Immediate: 3/3; Delayed: 1/3;; with cues: 1/3) Awareness: Appears intact Problem Solving: Appears intact Executive Function: Reasoning;Sequencing Reasoning: Appears intact Sequencing: Appears intact       Comprehension  Auditory Comprehension Overall Auditory Comprehension: Appears within functional limits for tasks assessed Yes/No Questions: Within Functional Limits Basic Immediate Environment Questions: (5/5) Complex Questions: (5/5) Paragraph Comprehension (via yes/no questions): (3/4) Commands: Within Functional Limits Two Step Basic Commands: (4/4) Multistep Basic Commands: (3/3) Reading Comprehension Reading Status: Within funtional limits    Expression Expression Primary Mode of Expression: Verbal Verbal Expression Overall Verbal Expression: Appears within functional limits for tasks assessed Initiation: No impairment Automatic Speech: Counting;Day of week;Month of year(WNL) Level of Generative/Spontaneous Verbalization: Conversation Repetition: No impairment(5/5) Naming: No impairment(5/5) Pragmatics: No impairment   Oral / Motor  Oral Motor/Sensory Function Overall Oral Motor/Sensory Function: Within functional limits Motor Speech Overall Motor Speech: Appears within functional limits for tasks assessed Respiration: Within functional limits Phonation: Normal Resonance: Within functional limits Articulation: Within functional limitis  Intelligibility: Intelligible Motor Planning: Witnin functional limits Motor Speech Errors: Not applicable   Marialuiza Car I. Hardin Negus, [place], [place] Office number [phone] Pager [place] 12/11/2019, 4:41 PM

## 2019-12-11 NOTE — Progress Notes (Signed)
  Echocardiogram 2D Echocardiogram has been performed.  Doris Carr 12/11/2019, 9:54 AM

## 2019-12-11 NOTE — Progress Notes (Signed)
Patient attempted to void x4 but unsuccessful. Patient stated I do have trouble voiding sometimes at home. RN bladder scan and yielded greater than 472cc. Passed it on incoming nurse.

## 2019-12-12 DIAGNOSIS — I6529 Occlusion and stenosis of unspecified carotid artery: Secondary | ICD-10-CM

## 2019-12-12 LAB — GLUCOSE, CAPILLARY
Glucose-Capillary: 157 mg/dL — ABNORMAL HIGH (ref 70–99)
Glucose-Capillary: 246 mg/dL — ABNORMAL HIGH (ref 70–99)
Glucose-Capillary: 220 mg/dL — ABNORMAL HIGH (ref 70–99)
Glucose-Capillary: 120 mg/dL — ABNORMAL HIGH (ref 70–99)
Glucose-Capillary: 68 mg/dL — ABNORMAL LOW (ref 70–99)
Glucose-Capillary: 235 mg/dL — ABNORMAL HIGH (ref 70–99)

## 2019-12-12 MED ORDER — PROPOFOL 1000 MG/100ML IV EMUL
INTRAVENOUS | Status: AC
  Filled 2019-12-12: qty 200

## 2019-12-12 MED ORDER — CEFAZOLIN SODIUM-DEXTROSE 1-4 GM/50ML-% IV SOLN
1.0000 g | INTRAVENOUS | Status: DC
Start: 2019-12-13 — End: 2019-12-12

## 2019-12-12 MED ORDER — CEFAZOLIN SODIUM-DEXTROSE 1-4 GM/50ML-% IV SOLN
1.0000 g | INTRAVENOUS | Status: AC
  Administered 2019-12-13: 12:00:00 1 g via INTRAVENOUS
  Filled 2019-12-12 (×2): qty 50

## 2019-12-12 NOTE — H&P (View-Only) (Signed)
Patient ID: Doris Carr, female   DOB: 07/17/1936, 84 y.o.   MRN: [id number]  Progress Note    12/12/2019 7:54 AM * No surgery date entered *  Subjective: No new visual changes.  No new neurologic deficits   Vitals:   12/11/19 2343 12/12/19 0500  BP: 131/70 140/81  Pulse: 73 75  Resp: 18 16  Temp: 98 F (36.7 C) (!) 97.5 F (36.4 C)  SpO2: 98% 95%   Physical Exam: Unchanged.  CBC    Component Value Date/Time   WBC 6.3 12/10/2019 1815   RBC 3.97 12/10/2019 1815   HGB 13.5 12/10/2019 1815   HCT 38.5 12/10/2019 1815   PLT 161 12/10/2019 1815   MCV 97.0 12/10/2019 1815   MCH 34.0 12/10/2019 1815   MCHC 35.1 12/10/2019 1815   RDW 11.1 (L) 12/10/2019 1815   LYMPHSABS 1.3 12/10/2019 1815   MONOABS 0.7 12/10/2019 1815   EOSABS 0.5 12/10/2019 1815   BASOSABS 0.1 12/10/2019 1815    BMET    Component Value Date/Time   NA 136 12/11/2019 1029   K 3.2 (L) 12/11/2019 1029   CL 94 (L) 12/11/2019 1029   CO2 29 12/11/2019 1029   GLUCOSE 182 (H) 12/11/2019 1029   BUN 27 (H) 12/11/2019 1029   CREATININE 0.92 12/11/2019 1029   CREATININE 1.47 (H) 05/06/2016 1535   CALCIUM 9.0 12/11/2019 1029   GFRNONAA 58 (L) 12/11/2019 1029   GFRAA >60 12/11/2019 1029    INR    Component Value Date/Time   INR 0.9 12/10/2019 1815     Intake/Output Summary (Last 24 hours) at 12/12/2019 0754 Last data filed at 12/12/2019 0745 Gross per 24 hour  Intake 1165 ml  Output --  Net 1165 ml     Assessment/Plan:  84 y.o. female again discussed plan for left carotid endarterectomy for reduction of stroke risk.  Again explained the procedure including expected postop recovery.  Also discussed 1 to 2% risk of stroke with surgery.  Plan surgery tomorrow     Rosetta Posner, MD [facility] [phone] 12/12/2019 7:54 AM

## 2019-12-12 NOTE — Progress Notes (Signed)
Progress Note    Doris Carr  [id number] DOB: 1935/10/06  DOA: 12/10/2019 PCP: Lawerance Cruel, MD    Brief Narrative:    Medical records reviewed and are as summarized below:  Doris Carr is an 84 y.o. female ear after being told by her optometrist to present for left eye visual loss.  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  MRI positive for CVA in stroke work-up underway.  Plan is for left CEA on 3/12 for bilateral ICA bulb stenosis with calcified plaques  Assessment/Plan:   Principal Problem:   Acute ischemic stroke (HCC) Active Problems:   Diabetes mellitus with nephropathy (HCC)   Anxiety   HTN (hypertension)   Diastolic CHF, chronic (HCC)   Transient vision disturbance   Renal insufficiency   Acute CVA (cerebrovascular accident) ([place])   CVA: Acute lacunar infarct at the left caudate head causing acute visual disturbance  - Presents with visual deficit involving left eye after seeing "flashes" in that eye the night of 3/6  - No acute findings on head CT  - Neurology consult appreciated: Recommended vascular consult for consideration of left CEA which is planned now for 3/12 also recommending aspirin 325 mg daily and Plavix 75 mg daily - Echo: With preserved EF of 55% -CTA see above regarding stenosis -Continue telemetry - LDL: 72: Goal of less than 70 -Hemoglobin A1c 7.6: Goal of less than 7 - Takes Repatha as an outpatient   -Plan is for left CEA for carotid stenosis on 3/12  Type II DM  -use low-intensity SSI with Novolog for now   -On Metformin 500 daily at home  Hypertension  - BP at goal  - Continue Norvasc and Coreg  -Will allow for permissive hypertension   Renal insufficiency  - Resolved  Chronic diastolic CHF  - Appears compensated, hold Lasix and losartan initially given renal insufficiency with unknown baseline    Anxiety; insomnia  - Continue low-dose clonazepam,  trazodone    Urinary retention -Patient states she is not having any symptoms of infection -Had normal bowel movement yesterday -Patient states she has a "shy bladder"  Family Communication/Anticipated D/C date and plan/Code Status   DVT prophylaxis: Heparin Code Status: Full Code.  Family Communication: Patient states she will speak with family Disposition Plan: Plan is for left CEA in a.m. with Dr. Donnetta Hutching  Medical Consultants:    Neurology  Vascular surgery    Subjective:   Working with physical therapy, no complaints  Objective:    Vitals:   12/11/19 1823 12/11/19 2343 12/12/19 0500 12/12/19 0841  BP: (!) 132/58 131/70 140/81 132/87  Pulse: 74 73 75 74  Resp: 16 18 16 16   Temp: 98.2 F (36.8 C) 98 F (36.7 C) (!) 97.5 F (36.4 C) 97.6 F (36.4 C)  TempSrc: Oral Oral Oral Oral  SpO2: 97% 98% 95% 96%  Weight:      Height:        Intake/Output Summary (Last 24 hours) at 12/12/2019 1128 Last data filed at 12/12/2019 0745 Gross per 24 hour  Intake 1045 ml  Output --  Net 1045 ml   Filed Weights   12/10/19 1636  Weight: 63.5 kg    Exam: Up working with physical therapy, no acute distress No increased work of breathing Moving all 4 extremities  Data Reviewed:   I have personally reviewed following labs and imaging studies:  Labs: Labs  show the following:   Basic Metabolic Panel: Recent Labs  Lab 12/10/19 1815 12/11/19 1029  NA 139 136  K 3.4* 3.2*  CL 96* 94*  CO2 28 29  GLUCOSE 164* 182*  BUN 39* 27*  CREATININE 1.33* 0.92  CALCIUM 9.7 9.0   GFR Estimated Creatinine Clearance: 43.2 mL/min (by C-G formula based on SCr of 0.92 mg/dL). Liver Function Tests: Recent Labs  Lab 12/10/19 1815  AST 19  ALT 11  ALKPHOS 43  BILITOT 0.8  PROT 6.0*  ALBUMIN 3.8   No results for input(s): LIPASE, AMYLASE in the last 168 hours. No results for input(s): AMMONIA in the last 168 hours. Coagulation profile Recent Labs  Lab 12/10/19 1815   INR 0.9    CBC: Recent Labs  Lab 12/10/19 1815  WBC 6.3  NEUTROABS 3.8  HGB 13.5  HCT 38.5  MCV 97.0  PLT 161   Cardiac Enzymes: No results for input(s): CKTOTAL, CKMB, CKMBINDEX, TROPONINI in the last 168 hours. BNP (last 3 results) No results for input(s): PROBNP in the last 8760 hours. CBG: Recent Labs  Lab 12/11/19 1614 12/11/19 2043 12/12/19 0607 12/12/19 0809 12/12/19 1056  GLUCAP 149* 166* 157* 246* 220*   D-Dimer: No results for input(s): DDIMER in the last 72 hours. Hgb A1c: Recent Labs    12/11/19 0612  HGBA1C 7.6*   Lipid Profile: Recent Labs    12/11/19 0612  CHOL 165  HDL 59  LDLCALC 72  TRIG 168*  CHOLHDL 2.8   Thyroid function studies: No results for input(s): TSH, T4TOTAL, T3FREE, THYROIDAB in the last 72 hours.  Invalid input(s): FREET3 Anemia work up: No results for input(s): VITAMINB12, FOLATE, FERRITIN, TIBC, IRON, RETICCTPCT in the last 72 hours. Sepsis Labs: Recent Labs  Lab 12/10/19 1815  WBC 6.3    Microbiology Recent Results (from the past 240 hour(s))  SARS CORONAVIRUS 2 (TAT 6-24 HRS) Nasopharyngeal Nasopharyngeal Swab     Status: None   Collection Time: 12/10/19  8:12 PM   Specimen: Nasopharyngeal Swab  Result Value Ref Range Status   SARS Coronavirus 2 NEGATIVE NEGATIVE Final    Comment: (NOTE) SARS-CoV-2 target nucleic acids are NOT DETECTED. The SARS-CoV-2 RNA is generally detectable in upper and lower respiratory specimens during the acute phase of infection. Negative results do not preclude SARS-CoV-2 infection, do not rule out co-infections with other pathogens, and should not be used as the sole basis for treatment or other patient management decisions. Negative results must be combined with clinical observations, patient history, and epidemiological information. The expected result is Negative. Fact Sheet for Patients: SugarRoll.be Fact Sheet for Healthcare  Providers: https://www.woods-mathews.com/ This test is not yet approved or cleared by the [place] FDA and  has been authorized for detection and/or diagnosis of SARS-CoV-2 by FDA under an Emergency Use Authorization (EUA). This EUA will remain  in effect (meaning this test can be used) for the duration of the COVID-19 declaration under Section 56 4(b)(1) of the Act, 21 U.S.C. section 360bbb-3(b)(1), unless the authorization is terminated or revoked sooner. Performed at [facility], [place] [address], [place], [place] [address]     Procedures and diagnostic studies:  CT ANGIO HEAD W OR WO CONTRAST  Result Date: 12/11/2019 CLINICAL DATA:  Abnormal MRA EXAM: CT ANGIOGRAPHY HEAD AND NECK TECHNIQUE: Multidetector CT imaging of the head and neck was performed using the standard protocol during bolus administration of intravenous contrast. Multiplanar CT image reconstructions and MIPs were obtained to  evaluate the vascular anatomy. Carotid stenosis measurements (when applicable) are obtained utilizing NASCET criteria, using the distal internal carotid diameter as the denominator. CONTRAST:  75 mL OMNIPAQUE IOHEXOL 350 MG/ML SOLN COMPARISON:  MRA earlier same day FINDINGS: CTA NECK Aortic arch: Calcified and noncalcified plaque the aortic arch. Great vessel origins are patent. Right carotid system: Patent. There is calcified plaque at the ICA origin causing less than 50% stenosis. Left carotid system: Patent. There is calcified plaque at the ICA origin causing approximately 50% stenosis. Vertebral arteries: Patent. Left vertebral artery is slightly dominant. No significant stenosis. Skeleton: Multilevel degenerative changes, greatest at C5-C6 at C6-C7. Other neck: No mass or adenopathy. Upper chest: No apical lung mass. Review of the MIP images confirms the above findings CTA HEAD Anterior circulation: Intracranial internal carotid arteries are patent with calcified plaque causing mild  stenosis. Middle and anterior cerebral arteries are patent. Left A1 ACA is dominant. Patent anterior communicating artery. Posterior circulation: Proximal intracranial right vertebral artery is patent. This either terminates as the PICA or becomes diminutive after PICA origin. There is probable plaque at the right PICA origin. Intracranial left vertebral artery is patent with plaque causing moderate stenosis. Left PICA is patent. Basilar artery is patent atherosclerotic irregularity particularly proximally where there is moderate stenosis. There is short segment marked stenosis at the origin of the left superior cerebellar artery. Posterior cerebral arteries are patent. Mild stenosis of the proximal left P2 PCA. Bilateral posterior communicating arteries are present. Venous sinuses: As permitted by contrast timing, patent. Present on the prior MRI, there is a partially calcified 9 mm meningioma along the left parietal convexity. Review of the MIP images confirms the above findings IMPRESSION: Plaque at the ICA origins causing less than 50% stenosis on the right and approximately 50% stenosis on the left. Posterior circulation intracranial atherosclerosis. Poorly visualized distal intracranial right vertebral artery, which may reflect plaque. Probable plaque at the right PICA origin. Moderate stenosis of the left vertebral and basilar arteries. Short segment marked stenosis at the left superior cerebellar artery origin. Mild stenosis of the proximal left P2 PCA. Electronically Signed   By: Macy Mis M.D.   On: 12/11/2019 10:58   CT HEAD WO CONTRAST  Result Date: 12/10/2019 CLINICAL DATA:  Vision loss EXAM: CT HEAD WITHOUT CONTRAST TECHNIQUE: Contiguous axial images were obtained from the base of the skull through the vertex without intravenous contrast. COMPARISON:  MRI 12/04/2018 FINDINGS: Brain: No acute territorial infarction, hemorrhage, or intracranial mass. Moderate atrophy. Moderate severe hypodensity  within the white matter consistent with chronic small vessel ischemic change. Ventricles appear enlarged but similar as compared with 12/04/2018. Vascular: No hyperdense vessels.  Carotid vascular calcification Skull: Normal. Negative for fracture or focal lesion. Sinuses/Orbits: No acute finding. Other: Incomplete fusion posterior arch of C1 IMPRESSION: 1. No CT evidence for acute intracranial abnormality. 2. Atrophy and chronic small vessel ischemic change of the white matter Electronically Signed   By: Donavan Foil M.D.   On: 12/10/2019 18:13   CT ANGIO NECK W OR WO CONTRAST  Result Date: 12/11/2019 CLINICAL DATA:  Abnormal MRA EXAM: CT ANGIOGRAPHY HEAD AND NECK TECHNIQUE: Multidetector CT imaging of the head and neck was performed using the standard protocol during bolus administration of intravenous contrast. Multiplanar CT image reconstructions and MIPs were obtained to evaluate the vascular anatomy. Carotid stenosis measurements (when applicable) are obtained utilizing NASCET criteria, using the distal internal carotid diameter as the denominator. CONTRAST:  75 mL OMNIPAQUE IOHEXOL 350 MG/ML  SOLN COMPARISON:  MRA earlier same day FINDINGS: CTA NECK Aortic arch: Calcified and noncalcified plaque the aortic arch. Great vessel origins are patent. Right carotid system: Patent. There is calcified plaque at the ICA origin causing less than 50% stenosis. Left carotid system: Patent. There is calcified plaque at the ICA origin causing approximately 50% stenosis. Vertebral arteries: Patent. Left vertebral artery is slightly dominant. No significant stenosis. Skeleton: Multilevel degenerative changes, greatest at C5-C6 at C6-C7. Other neck: No mass or adenopathy. Upper chest: No apical lung mass. Review of the MIP images confirms the above findings CTA HEAD Anterior circulation: Intracranial internal carotid arteries are patent with calcified plaque causing mild stenosis. Middle and anterior cerebral arteries are  patent. Left A1 ACA is dominant. Patent anterior communicating artery. Posterior circulation: Proximal intracranial right vertebral artery is patent. This either terminates as the PICA or becomes diminutive after PICA origin. There is probable plaque at the right PICA origin. Intracranial left vertebral artery is patent with plaque causing moderate stenosis. Left PICA is patent. Basilar artery is patent atherosclerotic irregularity particularly proximally where there is moderate stenosis. There is short segment marked stenosis at the origin of the left superior cerebellar artery. Posterior cerebral arteries are patent. Mild stenosis of the proximal left P2 PCA. Bilateral posterior communicating arteries are present. Venous sinuses: As permitted by contrast timing, patent. Present on the prior MRI, there is a partially calcified 9 mm meningioma along the left parietal convexity. Review of the MIP images confirms the above findings IMPRESSION: Plaque at the ICA origins causing less than 50% stenosis on the right and approximately 50% stenosis on the left. Posterior circulation intracranial atherosclerosis. Poorly visualized distal intracranial right vertebral artery, which may reflect plaque. Probable plaque at the right PICA origin. Moderate stenosis of the left vertebral and basilar arteries. Short segment marked stenosis at the left superior cerebellar artery origin. Mild stenosis of the proximal left P2 PCA. Electronically Signed   By: Macy Mis M.D.   On: 12/11/2019 10:58   MR ANGIO HEAD WO CONTRAST  Result Date: 12/11/2019 CLINICAL DATA:  Visual disturbance. Flashing in the left eye. Retinal artery occlusion by fundoscopy, per report EXAM: MRI HEAD WITHOUT CONTRAST MRA HEAD WITHOUT CONTRAST TECHNIQUE: Multiplanar, multiecho pulse sequences of the brain and surrounding structures were obtained without intravenous contrast. Angiographic images of the head were obtained using MRA technique without  contrast. COMPARISON:  Head CT from yesterday and brain MRI from 12/04/2018 FINDINGS: MRI HEAD FINDINGS Brain: Subcentimeter acute infarct at the left caudate head. No optic pathway ischemia. Clear suprasellar cistern. Atrophy with ventriculomegaly. Medial temporal volume loss is at least moderate but congruous with the generalized volume loss. Chronic small vessel ischemia in the deep white matter which is moderate to extensive. No hemorrhage, obstructive hydrocephalus, or collection. No chronic blood products. Vascular: Arterial findings below. Normal dural venous sinus flow voids Skull and upper cervical spine: Negative for marrow lesion. There is upper cervical degenerative disease with C2-3 anterolisthesis. Sinuses/Orbits: Bilateral cataract resection. No evidence of intra-ocular collection or orbital inflammation. MRA HEAD FINDINGS Symmetric vertebral and carotid artery size. Hypoplastic right A1 segment. Flow gap in the distal right vertebral artery from stenosis and likely accentuated by vessel tortuosity. There is also high-grade narrowing of the proximal basilar. Posterior circulation branch vessels are patent. Robust posterior communicating arteries. Hypoplastic right A1 segment. Negative for aneurysm or branch occlusion. Symmetric signal in the proximal ophthalmic arteries bilaterally. IMPRESSION: Brain MRI: 1. Acute lacunar infarct at the left caudate  head. 2. Generalized atrophy and extensive hemispheric small vessel disease without significant change compared to brain MRI 1 year ago. Intracranial MRA: 1. No emergent finding. No notable narrowing or luminal irregularity at the level of the left cavernous ICA. 2. High-grade vertebrobasilar stenoses with robust posterior communicating arteries. Electronically Signed   By: Monte Fantasia M.D.   On: 12/11/2019 06:36   MR Angiogram Neck W or Wo Contrast  Result Date: 12/11/2019 CLINICAL DATA:  Visual changes in the left eye. ER referral for retinal  artery occlusion EXAM: MRA NECK WITHOUT AND WITH CONTRAST TECHNIQUE: Multiplanar and multiecho pulse sequences of the neck were obtained without and with intravenous contrast. Angiographic images of the neck were obtained using MRA technique without and with intravenous contrast. CONTRAST:  6.62mL GADAVIST GADOBUTROL 1 MMOL/ML IV SOLN COMPARISON:  None. FINDINGS: Time-of-flight imaging shows antegrade flow in both carotid and vertebral arteries. This series is degraded by motion. Postcontrast imaging is complicated by patient activating squeeze ball during bolus injections such that images are early and late relative to the arterial phase. Based on source images and accounting for venous contamination: The arch is negative with 3 vessel branching. The bilateral systems show tortuosity with asymmetric left-sided atherosclerotic plaque at the bifurcation leading the 60% luminal stenosis. Plaque at the bulb is excavated, of indeterminate chronicity. No dissection is seen. No proximal subclavian stenosis. Vertebral artery assessment is notably limited by the degree of venous contamination, with no occlusion or flow limiting stenosis seen in the neck. There is occlusion of the distal right V4 segment and high-grade narrowing of the proximal basilar as described on dedicated intracranial MRA. IMPRESSION: 1. Limited study due to patient factors, as above. 2. Atherosclerosis with asymmetric involvement at the left carotid bifurcation where there is 60% luminal stenosis and excavated plaque which could relate to the patient's history. Electronically Signed   By: Monte Fantasia M.D.   On: 12/11/2019 07:18   MR BRAIN WO CONTRAST  Result Date: 12/11/2019 CLINICAL DATA:  Visual disturbance. Flashing in the left eye. Retinal artery occlusion by fundoscopy, per report EXAM: MRI HEAD WITHOUT CONTRAST MRA HEAD WITHOUT CONTRAST TECHNIQUE: Multiplanar, multiecho pulse sequences of the brain and surrounding structures were obtained  without intravenous contrast. Angiographic images of the head were obtained using MRA technique without contrast. COMPARISON:  Head CT from yesterday and brain MRI from 12/04/2018 FINDINGS: MRI HEAD FINDINGS Brain: Subcentimeter acute infarct at the left caudate head. No optic pathway ischemia. Clear suprasellar cistern. Atrophy with ventriculomegaly. Medial temporal volume loss is at least moderate but congruous with the generalized volume loss. Chronic small vessel ischemia in the deep white matter which is moderate to extensive. No hemorrhage, obstructive hydrocephalus, or collection. No chronic blood products. Vascular: Arterial findings below. Normal dural venous sinus flow voids Skull and upper cervical spine: Negative for marrow lesion. There is upper cervical degenerative disease with C2-3 anterolisthesis. Sinuses/Orbits: Bilateral cataract resection. No evidence of intra-ocular collection or orbital inflammation. MRA HEAD FINDINGS Symmetric vertebral and carotid artery size. Hypoplastic right A1 segment. Flow gap in the distal right vertebral artery from stenosis and likely accentuated by vessel tortuosity. There is also high-grade narrowing of the proximal basilar. Posterior circulation branch vessels are patent. Robust posterior communicating arteries. Hypoplastic right A1 segment. Negative for aneurysm or branch occlusion. Symmetric signal in the proximal ophthalmic arteries bilaterally. IMPRESSION: Brain MRI: 1. Acute lacunar infarct at the left caudate head. 2. Generalized atrophy and extensive hemispheric small vessel disease without significant change compared  to brain MRI 1 year ago. Intracranial MRA: 1. No emergent finding. No notable narrowing or luminal irregularity at the level of the left cavernous ICA. 2. High-grade vertebrobasilar stenoses with robust posterior communicating arteries. Electronically Signed   By: Monte Fantasia M.D.   On: 12/11/2019 06:36   ECHOCARDIOGRAM COMPLETE  Result  Date: 12/11/2019    ECHOCARDIOGRAM REPORT   Patient Name:   Doris Carr Plaia Date of Exam: 12/11/2019 Medical Rec #:  [id number]    Height:       65.0 in Accession #:    [id number]   Weight:       140.0 lb Date of Birth:  [birth year]    BSA:          1.700 m Patient Age:    14 years     BP:           143/76 mmHg Patient Gender: F            HR:           67 bpm. Exam Location:  Inpatient Procedure: 2D Echo, Cardiac Doppler and Color Doppler Indications:    TIA 435.9 / G45.9  History:        Patient has prior history of Echocardiogram examinations, most                 recent 05/27/2011. CHF, Arrythmias:LBBB, Signs/Symptoms:Syncope;                 Risk Factors:Hypertension, Diabetes, Dyslipidemia and GERD. CKD.  Sonographer:    Jonelle Sidle Dance Referring Phys: [id number] [place]  1. Left ventricular ejection fraction, by estimation, is 55 to 60%. The left ventricle has normal function. The left ventricle has no regional wall motion abnormalities. Left ventricular diastolic parameters are consistent with Grade I diastolic dysfunction (impaired relaxation).  2. Right ventricular systolic function is normal. The right ventricular size is normal.  3. The mitral valve is grossly normal. No evidence of mitral valve regurgitation.  4. The aortic valve is tricuspid. Aortic valve regurgitation is not visualized.  5. The inferior vena cava is normal in size with greater than 50% respiratory variability, suggesting right atrial pressure of 3 mmHg. FINDINGS  Left Ventricle: Left ventricular ejection fraction, by estimation, is 55 to 60%. The left ventricle has normal function. The left ventricle has no regional wall motion abnormalities. The left ventricular internal cavity size was normal in size. There is  no left ventricular hypertrophy. Left ventricular diastolic parameters are consistent with Grade I diastolic dysfunction (impaired relaxation). Indeterminate filling pressures. Right Ventricle: The right ventricular  size is normal. No increase in right ventricular wall thickness. Right ventricular systolic function is normal. Left Atrium: Left atrial size was normal in size. Right Atrium: Right atrial size was normal in size. Pericardium: There is no evidence of pericardial effusion. Presence of pericardial fat pad. Mitral Valve: The mitral valve is grossly normal. No evidence of mitral valve regurgitation. Tricuspid Valve: The tricuspid valve is grossly normal. Tricuspid valve regurgitation is not demonstrated. Aortic Valve: The aortic valve is tricuspid. Aortic valve regurgitation is not visualized. Pulmonic Valve: The pulmonic valve was grossly normal. Pulmonic valve regurgitation is not visualized. Aorta: The aortic root, ascending aorta, aortic arch and descending aorta are all structurally normal, with no evidence of dilitation or obstruction. Venous: The inferior vena cava is normal in size with greater than 50% respiratory variability, suggesting right atrial pressure of 3 mmHg. IAS/Shunts: The interatrial septum was not  well visualized.  LEFT VENTRICLE PLAX 2D LVIDd:         4.90 cm  Diastology LVIDs:         3.50 cm  LV e' lateral:   3.81 cm/s LV PW:         1.40 cm  LV E/e' lateral: 1.1 LV IVS:        0.70 cm LVOT diam:     2.10 cm LV SV:         82 LV SV Index:   48 LVOT Area:     3.46 cm  RIGHT VENTRICLE             IVC RV Basal diam:  1.90 cm     IVC diam: 2.00 cm RV S prime:     10.10 cm/s TAPSE (M-mode): 1.8 cm LEFT ATRIUM         Index LA diam:    3.10 cm 1.82 cm/m  AORTIC VALVE LVOT Vmax:   101.00 cm/s LVOT Vmean:  67.600 cm/s LVOT VTI:    0.238 m  AORTA Ao Root diam: 3.50 cm Ao Asc diam:  2.80 cm MV E velocity: 4.03 cm/s MV A velocity: 98.05 cm/s  SHUNTS MV E/A ratio:  0.04        Systemic VTI:  0.24 m                            Systemic Diam: 2.10 cm Lyman Bishop MD Electronically signed by Lyman Bishop MD Signature Date/Time: 12/11/2019/10:57:01 AM    Final     Medications:   . amLODipine  5 mg Oral  BID  . aspirin  300 mg Rectal Daily   Or  . aspirin  325 mg Oral Daily  . carvedilol  25 mg Oral BID  . citalopram  20 mg Oral Daily  . clonazepam  0.25 mg Oral q morning - 10a   And  . clonazepam  0.5 mg Oral QPM  . clopidogrel  75 mg Oral Daily  . estradiol  0.5 mg Oral Daily  . heparin  5,000 Units Subcutaneous Q8H  . insulin aspart  0-5 Units Subcutaneous QHS  . insulin aspart  0-9 Units Subcutaneous TID WC  . sodium chloride flush  3 mL Intravenous Q12H  . traZODone  50 mg Oral QHS   Continuous Infusions: . sodium chloride    . [START ON 12/13/2019]  ceFAZolin (ANCEF) IV       LOS: 1 day   Geradine Girt  [facility]   How to contact the [facility] Attending or Consulting provider [place] or covering provider during after hours [place], for this patient?  1. Check the care team in [place] [facility] Hospital and look for a) attending/consulting TR[facility] provider listed and b) the [facility] team listed 2. Log into [email or web address] and use [facility]'s universal password to access. If you do not have the password, please contact the hospital operator. 3. Locate the [facility] provider you are looking for under [facility] and page to a number that you can be directly reached. 4. If you still have difficulty reaching the provider, please page the [facility] (Director on Call) for the Hospitalists listed on amion for assistance.  12/12/2019, 11:28 AM

## 2019-12-12 NOTE — Progress Notes (Signed)
STROKE TEAM PROGRESS NOTE   INTERVAL HISTORY Pt sitting in bed, watching basketball game.  She stated that already planned for left CEA tomorrow.  I also discussed with her about estrogen p.o. daily, she stated that she was taking estrogen pills for the last 2 to 3 years, as it makes her feeling better.  I discussed about trial of discontinuation of aspirin p.o. given thromboembolic risk to see better she is able to tolerate, she is in agreement.  Vitals:   12/11/19 1823 12/11/19 2343 12/12/19 0500 12/12/19 0841  BP: (!) 132/58 131/70 140/81 132/87  Pulse: 74 73 75 74  Resp: 16 18 16 16   Temp: 98.2 F (36.8 C) 98 F (36.7 C) (!) 97.5 F (36.4 C) 97.6 F (36.4 C)  TempSrc: Oral Oral Oral Oral  SpO2: 97% 98% 95% 96%  Weight:      Height:        CBC:  Recent Labs  Lab 12/10/19 1815  WBC 6.3  NEUTROABS 3.8  HGB 13.5  HCT 38.5  MCV 97.0  PLT [id number]    Basic Metabolic Panel:  Recent Labs  Lab 12/10/19 1815 12/11/19 1029  NA 139 136  K 3.4* 3.2*  CL 96* 94*  CO2 28 29  GLUCOSE 164* 182*  BUN 39* 27*  CREATININE 1.33* 0.92  CALCIUM 9.7 9.0   Lipid Panel:     Component Value Date/Time   CHOL 165 12/11/2019 0612   CHOL 230 (H) 06/18/2019 1137   TRIG 168 (H) 12/11/2019 0612   HDL 59 12/11/2019 0612   HDL 59 06/18/2019 1137   CHOLHDL 2.8 12/11/2019 0612   VLDL 34 12/11/2019 0612   LDLCALC 72 12/11/2019 0612   LDLCALC 143 (H) 06/18/2019 1137   HgbA1c:  Lab Results  Component Value Date   HGBA1C 7.6 (H) 12/11/2019    IMAGING past 24 hours No results found.  PHYSICAL EXAM    Temp:  [97.5 F (36.4 C)-98.2 F (36.8 C)] 97.6 F (36.4 C) (03/11 0841) Pulse Rate:  [73-77] 74 (03/11 0841) Resp:  [16-18] 16 (03/11 0841) BP: (113-140)/(58-87) 132/87 (03/11 0841) SpO2:  [95 %-98 %] 96 % (03/11 0841)  General - Well nourished, well developed, in no apparent distress.  Ophthalmologic - fundi not visualized due to noncooperation.  Cardiovascular - Regular rhythm  and rate.  Mental Status -  Level of arousal and orientation to time, place, and person were intact. Language including expression, naming, repetition, comprehension was assessed and found intact. Fund of Knowledge was assessed and was intact.  Cranial Nerves II - XII - II - Visual field intact OU.  Subjective left eye upper field cloudiness. III, IV, VI - Extraocular movements intact. V - Facial sensation intact bilaterally. VII - Facial movement intact bilaterally. VIII - Hearing & vestibular intact bilaterally. X - Palate elevates symmetrically. XI - Chin turning & shoulder shrug intact bilaterally. XII - Tongue protrusion intact.  Motor Strength - The patient's strength was symmetrical in all extremities and pronator drift was absent.  Bulk was normal and fasciculations were absent.   Motor Tone - Muscle tone was assessed at the neck and appendages and was normal.  Reflexes - The patient's reflexes were symmetrical in all extremities and she had no pathological reflexes.  Sensory - Light touch, temperature/pinprick were assessed and were symmetrical.    Coordination - The patient had normal movements in the hands with no ataxia or dysmetria.  Tremor was absent.  Gait and Station - deferred.  ASSESSMENT/PLAN Ms. MEGYN GRIMES is a 84 y.o. female with history of back pain, arthritis, depression, diabetes, diastolic CHF, hyperlipidemia, hypertension, prior history of syncope, right knee pain status post total knee replacement presenting from optometrist office with L eye vision loss w/ possible L CRAO.  L BRAO  Diagnosed by ophthalmology  Likely related to left carotid stenosis and atherosclerosis  L CEA 3/12 (Early Vascular surgery)  Continue follow-up with ophthalmology.  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 Small vessel disease. Atrophy.   MRI  L caudate head infarct. Small vessel disease. Atrophy.    MRA head  No ELVO. High-grade VBJ stenoses.  MRA neck L ICA bifurcation 60% stenosis and excavated plaque  CTA head & neck ICA origin stenosis R < 50%, L 50%. Posterior circulation atherosclerosis - probable plaque R PICA origin, moderate L VA and BA stenosis, short market L superior cerebellar origin stenosis, L PCA P2 mild stenosis.    2D Echo EF 55-60%. No source of embolus   LDL 72  HgbA1c 7.6  Heparin 5000 units sq tid for VTE prophylaxis  clopidogrel 75 mg daily prior to admission, now on aspirin 325 mg daily and clopidogrel 75 mg daily.   Therapy recommendations:  HH PT, RW  Disposition:  pending    Carotid stenosis  MRA neck left ICA bifurcation 60% stenosis with calcified plaque  CTA head and neck ICA origin left 50% stenosis, right < 50% stenosis  Likely source of left caudate head infarct and left BRAO  L CEA 3/12 (Early Vascular surgery)  Hypertension  Stable . Permissive hypertension (OK if < 220/120) but gradually normalize in 2-3 days . Long-term BP goal normotensive  Hyperlipidemia  Home meds:  repatha (statin ineffective)  Placed on lipitor 40 on admission - will d/c  LDL 72, goal < 70  Continue home repatha at discharge. No indication to continue statin.  Diabetes type II Uncontrolled  HgbA1c 7.6, goal < 7.0  CBGs  SSI  Close PCP follow-up  Other Stroke Risk Factors  Advanced age  Former Cigarette smoker, quit 35 yrs ago  Chronic diastolic Congestive heart failure  PVD  Estrace 0.5 daily - discuss w/ pt and she is in agreement of trial to stop estrogen given thromboembolic risk  Other Active Problems  Renal insufficiency, creatinine 1.33, IV fluid for 12 hours after CTA head and neck  Urinary retention   Hospital day # 1  Rosalin Hawking, MD PhD Stroke Neurology 12/12/2019 10:54 AM   To contact Stroke Continuity provider, please refer to [email or web address] After hours, contact General Neurology

## 2019-12-12 NOTE — Progress Notes (Signed)
Physical Therapy Treatment Patient Details Name: Doris Carr MRN: [id number] DOB: 1935/11/05 Today's Date: 12/12/2019    History of Present Illness 84 y.o. female presented to her optometrist for left eye visual loss.  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 PMH significant for chronic diastolic CHF, OA, chronic renal insufficiency, HTN, and R TKA.  MRI positive for CVA, stroke work-up underway.    PT Comments    Patient making good progress with physical therapy in acute setting. Pt progressed gait distance today to ~60' with RW, she required cues for safe proximity and intermittent min assist to steady, HR remained in 70's-80's during mobility. Pt instructed in functional LE strengthening with sit to stands and is heavily reliant on UE strength due to compelete. Pt has good support at home from her husband and home aids 6x/week. She will continue to benefit from skilled PT follow with HHPT after discharge; Acute PT will progress as able during hospital stay.   Follow Up Recommendations  Supervision/Assistance - 24 hour;Home health PT     Equipment Recommendations  Rolling walker with 5" wheels    Recommendations for Other Services       Precautions / Restrictions Precautions Precautions: Fall Precaution Comments: pt reports 65 falls since her Rt TKA Restrictions Weight Bearing Restrictions: No    Mobility  Bed Mobility Overal bed mobility: Needs Assistance Bed Mobility: Supine to Sit;Sit to Supine     Supine to sit: Min guard Sit to supine: Min guard   General bed mobility comments: pt using bed rails and HOB elevated, no assist required  Transfers Overall transfer level: Needs assistance Equipment used: Rolling walker (2 wheeled) Transfers: Sit to/from Stand Sit to Stand: Min guard;Min assist         General transfer comment: pt reliant on bil UE use for power up  and prefering to use hand on RW despite cues to push up from bed.   Ambulation/Gait Ambulation/Gait assistance: Min assist Gait Distance (Feet): 60 Feet Assistive device: Rolling walker (2 wheeled) Gait Pattern/deviations: Step-through pattern;Decreased stride length;Trunk flexed Gait velocity: decreased   General Gait Details: pt required cues for posture throughout gait and trunk and hips flexed throughout limiting step length and foto clearance durign swing phase. Pt required min assist to steady and cue to maintain safe proximity to RW. 2 standing rest breaks required due to LE fatigue and LE's shaking at end of gait.   Stairs        Wheelchair Mobility    Modified Rankin (Stroke Patients Only)       Balance Overall balance assessment: Needs assistance Sitting-balance support: Feet supported;Bilateral upper extremity supported Sitting balance-Leahy Scale: Good     Standing balance support: Bilateral upper extremity supported;During functional activity Standing balance-Leahy Scale: Poor Standing balance comment: reliant on UE support           Cognition Arousal/Alertness: Awake/alert Behavior During Therapy: WFL for tasks assessed/performed Overall Cognitive Status: Within Functional Limits for tasks assessed           Exercises Other Exercises Other Exercises: 1x 10 reps Sit<>Stand for functional LE strengthening. Pt reliant on Bil UE's to initiate power up. pt paused 2x for ~ 30-60 seconds rest during exercise.    General Comments        Pertinent Vitals/Pain Pain Assessment: No/denies pain           PT  Goals (current goals can now be found in the care plan section) Acute Rehab PT Goals Patient Stated Goal: Return home and to prior level of function PT Goal Formulation: With patient Time For Goal Achievement: 12/25/19 Potential to Achieve Goals: Good Progress towards PT goals: Progressing toward goals    Frequency    Min 4X/week      PT  Plan Current plan remains appropriate       AM-PAC PT "6 Clicks" Mobility   Outcome Measure  Help needed turning from your back to your side while in a flat bed without using bedrails?: A Little Help needed moving from lying on your back to sitting on the side of a flat bed without using bedrails?: A Little Help needed moving to and from a bed to a chair (including a wheelchair)?: A Little Help needed standing up from a chair using your arms (e.g., wheelchair or bedside chair)?: A Little Help needed to walk in hospital room?: A Little Help needed climbing 3-5 steps with a railing? : A Lot 6 Click Score: 17    End of Session Equipment Utilized During Treatment: Gait belt Activity Tolerance: Patient tolerated treatment well Patient left: in bed;with call bell/phone within reach;with bed alarm set Nurse Communication: Mobility status PT Visit Diagnosis: Other abnormalities of gait and mobility (R26.89);Muscle weakness (generalized) (M62.81)     Time: [id number] PT Time Calculation (min) (ACUTE ONLY): 28 min  Charges:  $Gait Training: 8-22 mins $Therapeutic Exercise: 8-22 mins                     Verner Mould, DPT Physical Therapist with [facility] [phone]  12/12/2019 12:33 PM

## 2019-12-12 NOTE — Progress Notes (Signed)
Patient ID: Doris Carr, female   DOB: 1936/05/01, 84 y.o.   MRN: [id number]  Progress Note    12/12/2019 7:54 AM * No surgery date entered *  Subjective: No new visual changes.  No new neurologic deficits   Vitals:   12/11/19 2343 12/12/19 0500  BP: 131/70 140/81  Pulse: 73 75  Resp: 18 16  Temp: 98 F (36.7 C) (!) 97.5 F (36.4 C)  SpO2: 98% 95%   Physical Exam: Unchanged.  CBC    Component Value Date/Time   WBC 6.3 12/10/2019 1815   RBC 3.97 12/10/2019 1815   HGB 13.5 12/10/2019 1815   HCT 38.5 12/10/2019 1815   PLT 161 12/10/2019 1815   MCV 97.0 12/10/2019 1815   MCH 34.0 12/10/2019 1815   MCHC 35.1 12/10/2019 1815   RDW 11.1 (L) 12/10/2019 1815   LYMPHSABS 1.3 12/10/2019 1815   MONOABS 0.7 12/10/2019 1815   EOSABS 0.5 12/10/2019 1815   BASOSABS 0.1 12/10/2019 1815    BMET    Component Value Date/Time   NA 136 12/11/2019 1029   K 3.2 (L) 12/11/2019 1029   CL 94 (L) 12/11/2019 1029   CO2 29 12/11/2019 1029   GLUCOSE 182 (H) 12/11/2019 1029   BUN 27 (H) 12/11/2019 1029   CREATININE 0.92 12/11/2019 1029   CREATININE 1.47 (H) 05/06/2016 1535   CALCIUM 9.0 12/11/2019 1029   GFRNONAA 58 (L) 12/11/2019 1029   GFRAA >60 12/11/2019 1029    INR    Component Value Date/Time   INR 0.9 12/10/2019 1815     Intake/Output Summary (Last 24 hours) at 12/12/2019 0754 Last data filed at 12/12/2019 0745 Gross per 24 hour  Intake 1165 ml  Output --  Net 1165 ml     Assessment/Plan:  84 y.o. female again discussed plan for left carotid endarterectomy for reduction of stroke risk.  Again explained the procedure including expected postop recovery.  Also discussed 1 to 2% risk of stroke with surgery.  Plan surgery tomorrow     Rosetta Posner, MD [facility] [phone] 12/12/2019 7:54 AM

## 2019-12-13 ENCOUNTER — Encounter (HOSPITAL_COMMUNITY)
Admission: EM | Disposition: A | Discharge status: Home / Self Care | Payer: Self-pay | Source: Home / Self Care | Attending: Internal Medicine

## 2019-12-13 ENCOUNTER — Inpatient Hospital Stay (HOSPITAL_COMMUNITY): Payer: PPO | Admitting: Certified Registered"

## 2019-12-13 ENCOUNTER — Encounter (HOSPITAL_COMMUNITY): Payer: Self-pay | Admitting: Internal Medicine

## 2019-12-13 DIAGNOSIS — I6522 Occlusion and stenosis of left carotid artery: Secondary | ICD-10-CM

## 2019-12-13 HISTORY — PX: PATCH ANGIOPLASTY: SHX6230

## 2019-12-13 HISTORY — PX: ENDARTERECTOMY: SHX5162

## 2019-12-13 LAB — GLUCOSE, CAPILLARY
Glucose-Capillary: 139 mg/dL — ABNORMAL HIGH (ref 70–99)
Glucose-Capillary: 125 mg/dL — ABNORMAL HIGH (ref 70–99)
Glucose-Capillary: 180 mg/dL — ABNORMAL HIGH (ref 70–99)
Glucose-Capillary: 221 mg/dL — ABNORMAL HIGH (ref 70–99)
Glucose-Capillary: 211 mg/dL — ABNORMAL HIGH (ref 70–99)

## 2019-12-13 LAB — CBC
HCT: 36.8 % (ref 36.0–46.0)
Hemoglobin: 13.1 g/dL (ref 12.0–15.0)
MCH: 34.1 pg — ABNORMAL HIGH (ref 26.0–34.0)
MCHC: 35.6 g/dL (ref 30.0–36.0)
MCV: 95.8 fL (ref 80.0–100.0)
Platelets: 164 10*3/uL (ref 150–400)
RBC: 3.84 MIL/uL — ABNORMAL LOW (ref 3.87–5.11)
RDW: 11.1 % — ABNORMAL LOW (ref 11.5–15.5)
WBC: 6 10*3/uL (ref 4.0–10.5)
nRBC: 0 % (ref 0.0–0.2)

## 2019-12-13 LAB — BASIC METABOLIC PANEL
Anion gap: 16 — ABNORMAL HIGH (ref 5–15)
BUN: 30 mg/dL — ABNORMAL HIGH (ref 8–23)
CO2: 30 mmol/L (ref 22–32)
Calcium: 9.5 mg/dL (ref 8.9–10.3)
Chloride: 95 mmol/L — ABNORMAL LOW (ref 98–111)
Creatinine, Ser: 1.11 mg/dL — ABNORMAL HIGH (ref 0.44–1.00)
GFR calc Af Amer: 54 mL/min — ABNORMAL LOW (ref >60)
GFR calc non Af Amer: 47 mL/min — ABNORMAL LOW (ref >60)
Glucose, Bld: 140 mg/dL — ABNORMAL HIGH (ref 70–99)
Potassium: 3 mmol/L — ABNORMAL LOW (ref 3.5–5.1)
Sodium: 141 mmol/L (ref 135–145)

## 2019-12-13 LAB — SURGICAL PCR SCREEN
MRSA, PCR: NEGATIVE
Staphylococcus aureus: NEGATIVE

## 2019-12-13 LAB — PROTIME-INR
INR: 1 (ref 0.8–1.2)
Prothrombin Time: 13.4 seconds (ref 11.4–15.2)

## 2019-12-13 LAB — TYPE AND SCREEN
ABO/RH(D): A POS
Antibody Screen: NEGATIVE

## 2019-12-13 LAB — ABO/RH: ABO/RH(D): A POS

## 2019-12-13 SURGERY — ENDARTERECTOMY, CAROTID
Anesthesia: General | Site: Neck | Laterality: Left

## 2019-12-13 MED ORDER — POTASSIUM CHLORIDE CRYS ER 20 MEQ PO TBCR: 20.0000 meq | EXTENDED_RELEASE_TABLET | Freq: Every day | ORAL | Status: DC | PRN

## 2019-12-13 MED ORDER — ASPIRIN EC 81 MG PO TBEC
81.0000 mg | DELAYED_RELEASE_TABLET | Freq: Every day | ORAL | Status: DC
  Administered 2019-12-14: 81 mg via ORAL
  Filled 2019-12-13: qty 1

## 2019-12-13 MED ORDER — METOCLOPRAMIDE HCL 5 MG/ML IJ SOLN
5.0000 mg | Freq: Once | INTRAMUSCULAR | Status: AC
  Administered 2019-12-13: 5 mg via INTRAVENOUS

## 2019-12-13 MED ORDER — SODIUM CHLORIDE 0.9 % IV SOLN: 500.0000 mL | Freq: Once | INTRAVENOUS | Status: DC | PRN

## 2019-12-13 MED ORDER — METOPROLOL TARTRATE 5 MG/5ML IV SOLN: 2.0000 mg | INTRAVENOUS | Status: DC | PRN

## 2019-12-13 MED ORDER — ROSUVASTATIN CALCIUM 5 MG PO TABS
10.0000 mg | ORAL_TABLET | Freq: Every day | ORAL | Status: DC
  Administered 2019-12-13: 10 mg via ORAL
  Filled 2019-12-13: qty 2

## 2019-12-13 MED ORDER — ROCURONIUM BROMIDE 50 MG/5ML IV SOSY
PREFILLED_SYRINGE | INTRAVENOUS | Status: DC | PRN
  Administered 2019-12-13: 50 mg via INTRAVENOUS
  Administered 2019-12-13: 10 mg via INTRAVENOUS

## 2019-12-13 MED ORDER — POTASSIUM CHLORIDE 10 MEQ/100ML IV SOLN
INTRAVENOUS | Status: AC
  Filled 2019-12-13: qty 100

## 2019-12-13 MED ORDER — 0.9 % SODIUM CHLORIDE (POUR BTL) OPTIME
TOPICAL | Status: DC | PRN
  Administered 2019-12-13 (×2): 1000 mL

## 2019-12-13 MED ORDER — MORPHINE SULFATE (PF) 2 MG/ML IV SOLN: 2.0000 mg | INTRAVENOUS | Status: DC | PRN

## 2019-12-13 MED ORDER — FENTANYL CITRATE (PF) 100 MCG/2ML IJ SOLN
INTRAMUSCULAR | Status: AC
  Filled 2019-12-13: qty 2

## 2019-12-13 MED ORDER — PROTAMINE SULFATE 10 MG/ML IV SOLN
INTRAVENOUS | Status: DC | PRN
  Administered 2019-12-13: 10 mg via INTRAVENOUS
  Administered 2019-12-13: 30 mg via INTRAVENOUS
  Administered 2019-12-13: 10 mg via INTRAVENOUS

## 2019-12-13 MED ORDER — ALUM & MAG HYDROXIDE-SIMETH 200-200-20 MG/5ML PO SUSP: 15.0000 mL | ORAL | Status: DC | PRN

## 2019-12-13 MED ORDER — PANTOPRAZOLE SODIUM 40 MG PO TBEC
40.0000 mg | DELAYED_RELEASE_TABLET | Freq: Every day | ORAL | Status: DC
  Administered 2019-12-14: 40 mg via ORAL
  Filled 2019-12-13: qty 1

## 2019-12-13 MED ORDER — SUGAMMADEX SODIUM 200 MG/2ML IV SOLN
INTRAVENOUS | Status: DC | PRN
  Administered 2019-12-13: 200 mg via INTRAVENOUS

## 2019-12-13 MED ORDER — MIDAZOLAM HCL 2 MG/2ML IJ SOLN
INTRAMUSCULAR | Status: AC
  Filled 2019-12-13: qty 2

## 2019-12-13 MED ORDER — PROPOFOL 10 MG/ML IV BOLUS
INTRAVENOUS | Status: DC | PRN
  Administered 2019-12-13: 100 mg via INTRAVENOUS

## 2019-12-13 MED ORDER — DOCUSATE SODIUM 100 MG PO CAPS
100.0000 mg | ORAL_CAPSULE | Freq: Every day | ORAL | Status: DC
  Administered 2019-12-14: 100 mg via ORAL
  Filled 2019-12-13: qty 1

## 2019-12-13 MED ORDER — MAGNESIUM SULFATE 2 GM/50ML IV SOLN: 2.0000 g | Freq: Every day | INTRAVENOUS | Status: DC | PRN

## 2019-12-13 MED ORDER — CEFAZOLIN SODIUM-DEXTROSE 2-4 GM/100ML-% IV SOLN
2.0000 g | Freq: Three times a day (TID) | INTRAVENOUS | Status: AC
  Administered 2019-12-13 – 2019-12-14 (×2): 2 g via INTRAVENOUS
  Filled 2019-12-13 (×2): qty 100

## 2019-12-13 MED ORDER — HYDRALAZINE HCL 20 MG/ML IJ SOLN: 5.0000 mg | INTRAMUSCULAR | Status: DC | PRN

## 2019-12-13 MED ORDER — METOCLOPRAMIDE HCL 5 MG/ML IJ SOLN
INTRAMUSCULAR | Status: AC
  Filled 2019-12-13: qty 2

## 2019-12-13 MED ORDER — FENTANYL CITRATE (PF) 250 MCG/5ML IJ SOLN
INTRAMUSCULAR | Status: AC
  Filled 2019-12-13: qty 5

## 2019-12-13 MED ORDER — OXYCODONE HCL 5 MG PO TABS
5.0000 mg | ORAL_TABLET | ORAL | Status: DC | PRN
  Administered 2019-12-14: 10 mg via ORAL
  Filled 2019-12-13: qty 2

## 2019-12-13 MED ORDER — FENTANYL CITRATE (PF) 100 MCG/2ML IJ SOLN: 25.0000 ug | INTRAMUSCULAR | Status: DC | PRN

## 2019-12-13 MED ORDER — PHENOL 1.4 % MT LIQD
1.0000 | OROMUCOSAL | Status: DC | PRN
  Administered 2019-12-14: 1 via OROMUCOSAL
  Filled 2019-12-13: qty 177

## 2019-12-13 MED ORDER — DEXAMETHASONE SODIUM PHOSPHATE 10 MG/ML IJ SOLN
INTRAMUSCULAR | Status: DC | PRN
  Administered 2019-12-13: 8 mg via INTRAVENOUS

## 2019-12-13 MED ORDER — HEPARIN SODIUM (PORCINE) 5000 UNIT/ML IJ SOLN
5000.0000 [IU] | Freq: Three times a day (TID) | INTRAMUSCULAR | Status: DC
  Administered 2019-12-14: 5000 [IU] via SUBCUTANEOUS
  Filled 2019-12-13: qty 1

## 2019-12-13 MED ORDER — OXYCODONE-ACETAMINOPHEN 5-325 MG PO TABS: 1.0000 | ORAL_TABLET | ORAL | 0 refills | Status: AC | PRN

## 2019-12-13 MED ORDER — FENTANYL CITRATE (PF) 100 MCG/2ML IJ SOLN
INTRAMUSCULAR | Status: DC | PRN
  Administered 2019-12-13: 25 ug via INTRAVENOUS
  Administered 2019-12-13: 50 ug via INTRAVENOUS
  Administered 2019-12-13: 25 ug via INTRAVENOUS
  Administered 2019-12-13: 50 ug via INTRAVENOUS
  Administered 2019-12-13: 100 ug via INTRAVENOUS

## 2019-12-13 MED ORDER — POTASSIUM CHLORIDE 10 MEQ/100ML IV SOLN
10.0000 meq | INTRAVENOUS | Status: DC
  Administered 2019-12-13 (×2): 10 meq via INTRAVENOUS

## 2019-12-13 MED ORDER — SODIUM CHLORIDE 0.9 % IV SOLN: INTRAVENOUS | Status: DC

## 2019-12-13 MED ORDER — ONDANSETRON HCL 4 MG/2ML IJ SOLN: 4.0000 mg | Freq: Four times a day (QID) | INTRAMUSCULAR | Status: DC | PRN

## 2019-12-13 MED ORDER — LABETALOL HCL 5 MG/ML IV SOLN: 10.0000 mg | INTRAVENOUS | Status: DC | PRN

## 2019-12-13 MED ORDER — PHENYLEPHRINE HCL-NACL 10-0.9 MG/250ML-% IV SOLN
INTRAVENOUS | Status: DC | PRN
  Administered 2019-12-13: 40 ug/min via INTRAVENOUS

## 2019-12-13 MED ORDER — LIDOCAINE 2% (20 MG/ML) 5 ML SYRINGE
INTRAMUSCULAR | Status: DC | PRN
  Administered 2019-12-13: 20 mg via INTRAVENOUS

## 2019-12-13 MED ORDER — SODIUM CHLORIDE 0.9 % IV SOLN
INTRAVENOUS | Status: AC
  Filled 2019-12-13: qty 1.2

## 2019-12-13 MED ORDER — HEPARIN SODIUM (PORCINE) 1000 UNIT/ML IJ SOLN
INTRAMUSCULAR | Status: DC | PRN
  Administered 2019-12-13: 7000 [IU] via INTRAVENOUS

## 2019-12-13 MED ORDER — LACTATED RINGERS IV SOLN: INTRAVENOUS | Status: DC | PRN

## 2019-12-13 MED ORDER — CLEVIDIPINE BUTYRATE 0.5 MG/ML IV EMUL
1.0000 mg/h | INTRAVENOUS | Status: AC
  Administered 2019-12-13: 2 mg/h via INTRAVENOUS
  Filled 2019-12-13: qty 50

## 2019-12-13 MED ORDER — SODIUM CHLORIDE 0.9 % IV SOLN
INTRAVENOUS | Status: DC | PRN
  Administered 2019-12-13: 500 mL

## 2019-12-13 MED ORDER — ONDANSETRON HCL 4 MG PO TABS: 4.0000 mg | ORAL_TABLET | Freq: Every day | ORAL | 1 refills | Status: AC | PRN

## 2019-12-13 MED ORDER — GUAIFENESIN-DM 100-10 MG/5ML PO SYRP: 15.0000 mL | ORAL_SOLUTION | ORAL | Status: DC | PRN

## 2019-12-13 MED ORDER — ONDANSETRON HCL 4 MG/2ML IJ SOLN
INTRAMUSCULAR | Status: AC
  Filled 2019-12-13: qty 2

## 2019-12-13 MED ORDER — ONDANSETRON HCL 4 MG/2ML IJ SOLN
4.0000 mg | Freq: Once | INTRAMUSCULAR | Status: AC | PRN
  Administered 2019-12-13: 4 mg via INTRAVENOUS

## 2019-12-13 SURGICAL SUPPLY — 38 items
CANISTER SUCT 3000ML PPV (MISCELLANEOUS) ×3 IMPLANT
CANNULA VESSEL 3MM 2 BLNT TIP (CANNULA) ×6 IMPLANT
CATH ROBINSON RED A/P 18FR (CATHETERS) ×3 IMPLANT
CLIP LIGATING EXTRA MED SLVR (CLIP) ×3 IMPLANT
CLIP LIGATING EXTRA SM BLUE (MISCELLANEOUS) ×3 IMPLANT
COVER WAND RF STERILE (DRAPES) ×3 IMPLANT
DECANTER SPIKE VIAL GLASS SM (MISCELLANEOUS) IMPLANT
DERMABOND ADVANCED (GAUZE/BANDAGES/DRESSINGS) ×2
DERMABOND ADVANCED .7 DNX12 (GAUZE/BANDAGES/DRESSINGS) ×1 IMPLANT
DRAIN HEMOVAC 1/8 X 5 (WOUND CARE) IMPLANT
ELECT REM PT RETURN 9FT ADLT (ELECTROSURGICAL) ×3
ELECTRODE REM PT RTRN 9FT ADLT (ELECTROSURGICAL) ×1 IMPLANT
EVACUATOR SILICONE 100CC (DRAIN) IMPLANT
GLOVE SS BIOGEL STRL SZ 7.5 (GLOVE) ×1 IMPLANT
GLOVE SUPERSENSE BIOGEL SZ 7.5 (GLOVE) ×2
GOWN STRL REUS W/ TWL LRG LVL3 (GOWN DISPOSABLE) ×3 IMPLANT
GOWN STRL REUS W/TWL LRG LVL3 (GOWN DISPOSABLE) ×9
KIT BASIN OR (CUSTOM PROCEDURE TRAY) ×3 IMPLANT
KIT SHUNT ARGYLE CAROTID ART 6 (VASCULAR PRODUCTS) IMPLANT
KIT TURNOVER KIT B (KITS) ×3 IMPLANT
NEEDLE 22X1 1/2 (OR ONLY) (NEEDLE) IMPLANT
NS IRRIG 1000ML POUR BTL (IV SOLUTION) ×6 IMPLANT
PACK CAROTID (CUSTOM PROCEDURE TRAY) ×3 IMPLANT
PAD ARMBOARD 7.5X6 YLW CONV (MISCELLANEOUS) ×6 IMPLANT
PATCH HEMASHIELD 8X75 (Vascular Products) ×2 IMPLANT
POSITIONER HEAD DONUT 9IN (MISCELLANEOUS) ×3 IMPLANT
SHUNT CAROTID BYPASS 10 (VASCULAR PRODUCTS) ×2 IMPLANT
SHUNT CAROTID BYPASS 12FRX15.5 (VASCULAR PRODUCTS) IMPLANT
SUT ETHILON 3 0 PS 1 (SUTURE) IMPLANT
SUT PROLENE 6 0 CC (SUTURE) ×3 IMPLANT
SUT SILK 3 0 (SUTURE)
SUT SILK 3-0 18XBRD TIE 12 (SUTURE) IMPLANT
SUT VIC AB 3-0 SH 27 (SUTURE) ×6
SUT VIC AB 3-0 SH 27X BRD (SUTURE) ×2 IMPLANT
SUT VICRYL 4-0 PS2 18IN ABS (SUTURE) ×3 IMPLANT
SYR CONTROL 10ML LL (SYRINGE) IMPLANT
TOWEL GREEN STERILE (TOWEL DISPOSABLE) ×3 IMPLANT
WATER STERILE IRR 1000ML POUR (IV SOLUTION) ×3 IMPLANT

## 2019-12-13 NOTE — Progress Notes (Signed)
Patient is transported to the OR.

## 2019-12-13 NOTE — Discharge Instructions (Signed)
   [facility] [place]  Discharge Instructions   Carotid Endarterectomy (CEA)  Please refer to the following instructions for your post-procedure care. Your surgeon or physician assistant will discuss any changes with you.  Activity  You are encouraged to walk as much as you can. You can slowly return to normal activities but must avoid strenuous activity and heavy lifting until your doctor tell you it's OK. Avoid activities such as vacuuming or swinging a golf club. You can drive after one week if you are comfortable and you are no longer taking prescription pain medications. It is normal to feel tired for serval weeks after your surgery. It is also normal to have difficulty with sleep habits, eating, and bowel movements after surgery. These will go away with time.  Bathing/Showering  You may shower after you come home. Do not soak in a bathtub, hot tub, or swim until the incision heals completely.  Incision Care  Shower every day. Clean your incision with mild soap and water. Pat the area dry with a clean towel. You do not need a bandage unless otherwise instructed. Do not apply any ointments or creams to your incision. You may have skin glue on your incision. Do not peel it off. It will come off on its own in about one week. Your incision may feel thickened and raised for several weeks after your surgery. This is normal and the skin will soften over time. For Men Only: It's OK to shave around the incision but do not shave the incision itself for 2 weeks. It is common to have numbness under your chin that could last for several months.  Diet  Resume your normal diet. There are no special food restrictions following this procedure. A low fat/low cholesterol diet is recommended for all patients with vascular disease. In order to heal from your surgery, it is CRITICAL to get adequate nutrition. Your body requires vitamins, minerals, and protein. Vegetables are the best  source of vitamins and minerals. Vegetables also provide the perfect balance of protein. Processed food has little nutritional value, so try to avoid this.        Medications  Resume taking all of your medications unless your doctor or physician assistant tells you not to. If your incision is causing pain, you may take over-the- counter pain relievers such as acetaminophen (Tylenol). If you were prescribed a stronger pain medication, please be aware these medications can cause nausea and constipation. Prevent nausea by taking the medication with a snack or meal. Avoid constipation by drinking plenty of fluids and eating foods with a high amount of fiber, such as fruits, vegetables, and grains. Do not take Tylenol if you are taking prescription pain medications.  Follow Up  Our office will schedule a follow up appointment 2-3 weeks following discharge.  Please call us immediately for any of the following conditions  Increased pain, redness, drainage (pus) from your incision site. Fever of 101 degrees or higher. If you should develop stroke (slurred speech, difficulty swallowing, weakness on one side of your body, loss of vision) you should call 911 and go to the nearest emergency room.  Reduce your risk of vascular disease:  Stop smoking. If you would like help call [facility] at 1-800-QUIT-NOW (1-800-784-8669) or  at 336-586-4000. Manage your cholesterol Maintain a desired weight Control your diabetes Keep your blood pressure down  If you have any questions, please call the office at [phone].   

## 2019-12-13 NOTE — Progress Notes (Signed)
Progress Note    SHANEE Carr  [id number] DOB: [birth year]  DOA: 12/10/2019 PCP: Lawerance Cruel, MD    Brief Narrative:    Medical records reviewed and are as summarized below:  Doris Carr is an 84 y.o. female ear after being told by her optometrist to present for left eye visual loss.  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  MRI positive for CVA in stroke work-up underway.  Plan is for left CEA on 3/12 for bilateral ICA bulb stenosis with calcified plaques  Assessment/Plan:   Principal Problem:   Acute ischemic stroke (HCC) Active Problems:   Diabetes mellitus with nephropathy (HCC)   Anxiety   HTN (hypertension)   Diastolic CHF, chronic (HCC)   Transient vision disturbance   Renal insufficiency   Acute CVA (cerebrovascular accident) ([place])   Carotid stenosis   CVA: Acute lacunar infarct at the left caudate head causing acute visual disturbance  - Presents with visual deficit involving left eye after seeing "flashes" in that eye the night of 3/6  - No acute findings on head CT  - Neurology consult appreciated: Recommended vascular consult for consideration of left CEA which is planned now for 3/12 also recommending aspirin 325 mg daily and Plavix 75 mg daily - Echo: With preserved EF of 55% -CTA see above regarding stenosis -Continue telemetry - LDL: 72: Goal of less than 70 -Hemoglobin A1c 7.6: Goal of less than 7 - Takes Repatha as an outpatient   -left CEA for carotid stenosis on 3/12  Type II DM  -use low-intensity SSI with Novolog for now   -On Metformin 500 daily at home  Hypertension  - BP at goal  - Continue Norvasc and Coreg  -Will allow for permissive hypertension   Renal insufficiency  - Resolved  Chronic diastolic CHF  - Appears compensated, hold Lasix and losartan initially given renal insufficiency with unknown baseline    Anxiety; insomnia  - Continue low-dose  clonazepam, trazodone    Urinary retention -Patient states she is not having any symptoms of infection -Had normal bowel movement yesterday -Patient states she has a "shy bladder"  Family Communication/Anticipated D/C date and plan/Code Status   DVT prophylaxis: Heparin Code Status: Full Code.  Family Communication: Spoke with patient's husband Disposition Plan: Plan is for left CEA in a.m. with Dr. Donnetta Hutching  Medical Consultants:    Neurology  Vascular surgery    Subjective:   No overnight events, patient aware she is for surgery today, asked me to call husband and give him an update  Objective:    Vitals:   12/12/19 1833 12/12/19 2356 12/13/19 0439 12/13/19 1033  BP: (!) 148/71 (!) 141/67 (!) 121/52 (!) 141/82  Pulse: 75 78 73 69  Resp: 17 18  18   Temp: 98.5 F (36.9 C) 98 F (36.7 C) 98.2 F (36.8 C) (!) 97.5 F (36.4 C)  TempSrc: Oral Oral Oral Oral  SpO2: 94% 94% 95% 98%  Weight:      Height:       No intake or output data in the 24 hours ending 12/13/19 1107 Filed Weights   12/10/19 1636  Weight: 63.5 kg    Exam: In bed, NAD rrr No increased work of breathing No LE edema A+OX3  Data Reviewed:   I have personally reviewed following labs and imaging studies:  Labs: Labs show the following:   Basic Metabolic Panel: Recent  Labs  Lab 12/10/19 1815 12/10/19 1815 12/11/19 1029 12/13/19 0637  NA 139  --  136 141  K 3.4*   < > 3.2* 3.0*  CL 96*  --  94* 95*  CO2 28  --  29 30  GLUCOSE 164*  --  182* 140*  BUN 39*  --  27* 30*  CREATININE 1.33*  --  0.92 1.11*  CALCIUM 9.7  --  9.0 9.5   < > = values in this interval not displayed.   GFR Estimated Creatinine Clearance: 35.8 mL/min (A) (by C-G formula based on SCr of 1.11 mg/dL (H)). Liver Function Tests: Recent Labs  Lab 12/10/19 1815  AST 19  ALT 11  ALKPHOS 43  BILITOT 0.8  PROT 6.0*  ALBUMIN 3.8   No results for input(s): LIPASE, AMYLASE in the last 168 hours. No results for  input(s): AMMONIA in the last 168 hours. Coagulation profile Recent Labs  Lab 12/10/19 1815 12/13/19 0637  INR 0.9 1.0    CBC: Recent Labs  Lab 12/10/19 1815 12/13/19 0637  WBC 6.3 6.0  NEUTROABS 3.8  --   HGB 13.5 13.1  HCT 38.5 36.8  MCV 97.0 95.8  PLT 161 164   Cardiac Enzymes: No results for input(s): CKTOTAL, CKMB, CKMBINDEX, TROPONINI in the last 168 hours. BNP (last 3 results) No results for input(s): PROBNP in the last 8760 hours. CBG: Recent Labs  Lab 12/12/19 1640 12/12/19 1718 12/12/19 2127 12/13/19 0546 12/13/19 1030  GLUCAP 68* 120* 235* 139* 125*   D-Dimer: No results for input(s): DDIMER in the last 72 hours. Hgb A1c: Recent Labs    12/11/19 0612  HGBA1C 7.6*   Lipid Profile: Recent Labs    12/11/19 0612  CHOL 165  HDL 59  LDLCALC 72  TRIG 168*  CHOLHDL 2.8   Thyroid function studies: No results for input(s): TSH, T4TOTAL, T3FREE, THYROIDAB in the last 72 hours.  Invalid input(s): FREET3 Anemia work up: No results for input(s): VITAMINB12, FOLATE, FERRITIN, TIBC, IRON, RETICCTPCT in the last 72 hours. Sepsis Labs: Recent Labs  Lab 12/10/19 1815 12/13/19 0637  WBC 6.3 6.0    Microbiology Recent Results (from the past 240 hour(s))  SARS CORONAVIRUS 2 (TAT 6-24 HRS) Nasopharyngeal Nasopharyngeal Swab     Status: None   Collection Time: 12/10/19  8:12 PM   Specimen: Nasopharyngeal Swab  Result Value Ref Range Status   SARS Coronavirus 2 NEGATIVE NEGATIVE Final    Comment: (NOTE) SARS-CoV-2 target nucleic acids are NOT DETECTED. The SARS-CoV-2 RNA is generally detectable in upper and lower respiratory specimens during the acute phase of infection. Negative results do not preclude SARS-CoV-2 infection, do not rule out co-infections with other pathogens, and should not be used as the sole basis for treatment or other patient management decisions. Negative results must be combined with clinical observations, patient history, and  epidemiological information. The expected result is Negative. Fact Sheet for Patients: SugarRoll.be Fact Sheet for Healthcare Providers: https://www.woods-mathews.com/ This test is not yet approved or cleared by the [place] FDA and  has been authorized for detection and/or diagnosis of SARS-CoV-2 by FDA under an Emergency Use Authorization (EUA). This EUA will remain  in effect (meaning this test can be used) for the duration of the COVID-19 declaration under Section 56 4(b)(1) of the Act, 21 U.S.C. section 360bbb-3(b)(1), unless the authorization is terminated or revoked sooner. Performed at [facility], [place] [address] [place], [place] [address]   Surgical pcr  screen     Status: None   Collection Time: 12/13/19  7:28 AM   Specimen: Nasal Mucosa; Nasal Swab  Result Value Ref Range Status   MRSA, PCR NEGATIVE NEGATIVE Final   Staphylococcus aureus NEGATIVE NEGATIVE Final    Comment: (NOTE) The Xpert SA Assay (FDA approved for NASAL specimens in patients 81 years of age and older), is one component of a comprehensive surveillance program. It is not intended to diagnose infection nor to guide or monitor treatment. Performed at [facility], [place] [address] [place], [place] [address]     Procedures and diagnostic studies:  No results found.  Medications:   . [MAR Hold] amLODipine  5 mg Oral BID  . [MAR Hold] aspirin  300 mg Rectal Daily   Or  . [MAR Hold] aspirin  325 mg Oral Daily  . [MAR Hold] carvedilol  25 mg Oral BID  . [MAR Hold] citalopram  20 mg Oral Daily  . [MAR Hold] clonazepam  0.25 mg Oral q morning - 10a   And  . [MAR Hold] clonazepam  0.5 mg Oral QPM  . [MAR Hold] clopidogrel  75 mg Oral Daily  . [MAR Hold] heparin  5,000 Units Subcutaneous Q8H  . [MAR Hold] insulin aspart  0-5 Units Subcutaneous QHS  . [MAR Hold] insulin aspart  0-9 Units Subcutaneous TID WC  . [MAR Hold] sodium chloride flush   3 mL Intravenous Q12H  . [MAR Hold] traZODone  50 mg Oral QHS   Continuous Infusions: . [MAR Hold] sodium chloride    .  ceFAZolin (ANCEF) IV Stopped (12/13/19 ED:[id number])     LOS: 2 days   Geradine Girt  [facility]   How to contact the [facility] Attending or Consulting provider [place] or covering provider during after hours [place], for this patient?  1. Check the care team in [facility] and look for a) attending/consulting TRH provider listed and b) the [facility] team listed 2. Log into [email or web address] and use [place]'s universal password to access. If you do not have the password, please contact the hospital operator. 3. Locate the [facility] provider you are looking for under [facility] and page to a number that you can be directly reached. 4. If you still have difficulty reaching the provider, please page the [facility] (Director on Call) for the Hospitalists listed on amion for assistance.  12/13/2019, 11:07 AM

## 2019-12-13 NOTE — Anesthesia Procedure Notes (Signed)
Procedure Name: Intubation Date/Time: 12/13/2019 11:45 AM Performed by: Lavell Luster, CRNA Pre-anesthesia Checklist: Patient identified, Emergency Drugs available, Suction available, Patient being monitored and Timeout performed Patient Re-evaluated:Patient Re-evaluated prior to induction Oxygen Delivery Method: Circle system utilized Preoxygenation: Pre-oxygenation with 100% oxygen Induction Type: IV induction Ventilation: Mask ventilation without difficulty Laryngoscope Size: Mac and 4 Grade View: Grade I Tube type: Oral Tube size: 7.0 mm Number of attempts: 1 Airway Equipment and Method: Stylet Placement Confirmation: ETT inserted through vocal cords under direct vision,  positive ETCO2 and breath sounds checked- equal and bilateral Secured at: 21 cm Tube secured with: Tape Dental Injury: Teeth and Oropharynx as per pre-operative assessment

## 2019-12-13 NOTE — Progress Notes (Signed)
Patient sleepy and does not want to be bothered. Yet patient asked not to be left along briefly before going back to sleep. VSS

## 2019-12-13 NOTE — Progress Notes (Signed)
Patient arrived to unit from PACU. VSS, CCMD called, CHG performed, patient nauseated and not wanting to be assessed. Patient husband arrived and aware that wife given medication for PONV and does not want to be bothered at this time. Husband states wife came to ED because of vision issue caused by blockage. Husband will return tomorrow. Bed alarm on and will continue to monitor.  Patient moved extremities in bed and during CHG bath. Unable to perform NIH due to patient refusal at this time. Will continue to assess.

## 2019-12-13 NOTE — Anesthesia Procedure Notes (Signed)
Arterial Line Insertion Start/End3/09/2020 11:00 AM, 12/13/2019 11:15 AM Performed by: Lavell Luster, CRNA, CRNA  Patient location: Pre-op. Preanesthetic checklist: patient identified, IV checked, site marked, risks and benefits discussed, surgical consent, monitors and equipment checked, pre-op evaluation and timeout performed Lidocaine 1% used for infiltration Right, radial was placed Catheter size: 20 G Hand hygiene performed , maximum sterile barriers used  and Seldinger technique used Allen's test indicative of satisfactory collateral circulation Attempts: 1 Procedure performed without using ultrasound guided technique. Following insertion, Biopatch and dressing applied. Post procedure assessment: normal  Patient tolerated the procedure well with no immediate complications.

## 2019-12-13 NOTE — Anesthesia Preprocedure Evaluation (Signed)
Anesthesia Evaluation  Patient identified by MRN, date of birth, ID band Patient awake    Reviewed: Allergy & Precautions, NPO status , Patient's Chart, lab work & pertinent test results  Airway Mallampati: II  TM Distance: >3 FB Neck ROM: Full    Dental  (+) Teeth Intact, Dental Advisory Given   Pulmonary former smoker,    breath sounds clear to auscultation       Cardiovascular hypertension,  Rhythm:Regular Rate:Normal     Neuro/Psych    GI/Hepatic   Endo/Other  diabetes  Renal/GU      Musculoskeletal   Abdominal   Peds  Hematology   Anesthesia Other Findings   Reproductive/Obstetrics                             Anesthesia Physical Anesthesia Plan  ASA: III  Anesthesia Plan: General   Post-op Pain Management:    Induction: Intravenous  PONV Risk Score and Plan: Ondansetron  Airway Management Planned: Oral ETT  Additional Equipment: Arterial line  Intra-op Plan:   Post-operative Plan: Extubation in OR  Informed Consent: I have reviewed the patients History and Physical, chart, labs and discussed the procedure including the risks, benefits and alternatives for the proposed anesthesia with the patient or authorized representative who has indicated his/her understanding and acceptance.     Dental advisory given  Plan Discussed with: CRNA and Anesthesiologist  Anesthesia Plan Comments:         Anesthesia Quick Evaluation

## 2019-12-13 NOTE — Op Note (Signed)
   OPERATIVE REPORT  DATE OF SURGERY: 12/13/2019  PATIENT: Doris Carr, 84 y.o. female MRN: [id number]  DOB: 13-Jul-1936  PRE-OPERATIVE DIAGNOSIS: Left Carotid Stenosis, Symptomatic  POST-OPERATIVE DIAGNOSIS:  Same  PROCEDURE:  Left Carotid Endarterectomy with Dacron Patch Angioplasty  SURGEON:  Curt Jews, M.D.  PHYSICIAN ASSISTANT: Dr. Deitra Mayo, CoreyBaglia PA-C  ANESTHESIA:   general  EBL: Less than 200 ml  Total I/O In: 950 [I.V.:700; IV Piggyback:250] Out: 700 [Urine:700]  BLOOD ADMINISTERED: none  DRAINS: none   SPECIMEN: none  COUNTS CORRECT:  YES  PLAN OF CARE: Admit to inpatient   PATIENT DISPOSITION:  PACU - hemodynamically stable and neurologically intact.  PROCEDURE DETAILS: The patient was taken to the operating room placed in supine position.  General anesthesia was administered.  The neck was prepped and draped in the usual sterile fashion.  An incision was made anterior to the sternocleidomastoid and carried down through the platysma with electrocautery.  The sternocleidomastoid was reflected posteriorly and the carotid sheath was opened.  The facial vein was ligated with 2-0 silk ties and divided.  The common carotid artery was encircled with an umbilical tape and Rummel tourniquet.  The vagus nerve was identified and preserved.  Dissection was continued onto the carotid bifurcation.  The superior thyroid artery was encircled with a 2-0 silk Potts tie.  The external carotid was encircled with a blue vessel loop and the internal carotid was encircled with an umbilical tape and Rummel tourniquet.  The hypoglossal nerve was identified and preserved.  The patient was given systemic heparin and after adequate circulation time, the internal, external and common carotid arteries were occluded with vascular clamps.  The common carotid artery was opened with an 11 blade and extended  longitudinally with Potts scissors.  A 10 shunt was passed up the internal  carotid and allowed to backbleed.  It was then passed down the common carotid where it was secured with Rummel tourniquet.  The endarterectomy was begun on the common carotid artery and the plaque was divided proximally with Potts scissors.  The endarterectomy was continued onto the bifurcation.  The external carotid was endarterectomized with an eversion technique and the internal carotid was endarterectomized in an open fashion.  Remaining atheromatous debris was removed from the endarterectomy plane.  A Finesse Hemashield Dacron patch was brought onto the field and was sewn as a patch angioplasty with a running 6-0 Prolene suture.  Prior to completion of the closure the shunt was removed and the usual flushing maneuvers were undertaken.  The anastomosis was completed and flow was restored first to the external and then the internal carotid artery.  Excellent flow characteristics were noted with hand-held Doppler in the internal and external carotid arteries.  The patient was given 50 mg of protamine to reverse the heparin.  The wounds were irrigated with saline.  Hemostasis was obtained with electrocautery.  The wounds were closed with 3-0 Vicryl to reapproximate the sternocleidomastoid over the carotid sheath.  The platysma was lysed with a running 3-0 Vicryl suture.  The skin was closed with a 4-0 subcuticular Vicryl stitch.  Dermabond was applied.  The patient was awakened neurologically intact in the operating room and transferred to the recovery room in stable condition   Curt Jews, M.D. 12/13/2019 1:54 PM

## 2019-12-13 NOTE — Transfer of Care (Signed)
Immediate Anesthesia Transfer of Care Note  Patient: Doris Carr  Procedure(s) Performed: ENDARTERECTOMY CAROTID (Left Neck) Patch Angioplasty (Left Neck)  Patient Location: PACU  Anesthesia Type:General  Level of Consciousness: awake, alert , sedated and patient cooperative  Airway & Oxygen Therapy: Patient connected to nasal cannula oxygen  Post-op Assessment: Post -op Vital signs reviewed and stable  Post vital signs: stable  Last Vitals:  Vitals Value Taken Time  BP 145/76 12/13/19 1355  Temp    Pulse 68 12/13/19 1403  Resp 13 12/13/19 1403  SpO2 95 % 12/13/19 1403  Vitals shown include unvalidated device data.  Last Pain:  Vitals:   12/13/19 1033  TempSrc: Oral  PainSc: 0-No pain      Patients Stated Pain Goal: 0 ([id number])  Complications: No apparent anesthesia complications

## 2019-12-13 NOTE — Progress Notes (Signed)
STROKE TEAM PROGRESS NOTE   INTERVAL HISTORY Pt seen in room from PACU. She stated "I am very sick", "I am cold". I covered her with blanket and sheet, she said "thank you". However, she is very lethargic and per RN, she does not want to be bothered at this time. RN said pt was able to move all extremities with bath. BP stable with A line.    Vitals:   12/12/19 1231 12/12/19 1833 12/12/19 2356 12/13/19 0439  BP: 115/61 (!) 148/71 (!) 141/67 (!) 121/52  Pulse: 69 75 78 73  Resp: 16 17 18    Temp: (!) 97.3 F (36.3 C) 98.5 F (36.9 C) 98 F (36.7 C) 98.2 F (36.8 C)  TempSrc: Oral Oral Oral Oral  SpO2: 97% 94% 94% 95%  Weight:      Height:        CBC:  Recent Labs  Lab 12/10/19 1815 12/13/19 0637  WBC 6.3 6.0  NEUTROABS 3.8  --   HGB 13.5 13.1  HCT 38.5 36.8  MCV 97.0 95.8  PLT 161 [id number]    Basic Metabolic Panel:  Recent Labs  Lab 12/11/19 1029 12/13/19 0637  NA 136 141  K 3.2* 3.0*  CL 94* 95*  CO2 29 30  GLUCOSE 182* 140*  BUN 27* 30*  CREATININE 0.92 1.11*  CALCIUM 9.0 9.5   Lipid Panel:     Component Value Date/Time   CHOL 165 12/11/2019 0612   CHOL 230 (H) 06/18/2019 1137   TRIG 168 (H) 12/11/2019 0612   HDL 59 12/11/2019 0612   HDL 59 06/18/2019 1137   CHOLHDL 2.8 12/11/2019 0612   VLDL 34 12/11/2019 0612   LDLCALC 72 12/11/2019 0612   LDLCALC 143 (H) 06/18/2019 1137   HgbA1c:  Lab Results  Component Value Date   HGBA1C 7.6 (H) 12/11/2019    IMAGING past 24 hours No results found.  PHYSICAL EXAM  Temp:  [97.3 F (36.3 C)-98.5 F (36.9 C)] 98.2 F (36.8 C) (03/12 0439) Pulse Rate:  [69-78] 73 (03/12 0439) Resp:  [16-18] 18 (03/11 2356) BP: (115-148)/(52-71) 121/52 (03/12 0439) SpO2:  [94 %-97 %] 95 % (03/12 0439)  General - Well nourished, well developed, lethargic.  Ophthalmologic - fundi not visualized due to noncooperation.  Cardiovascular - Regular rhythm and rate.  Neuro - lethargic, eyes close but easily open with voice,  limited exam with pt declining exam. With eyes open, she is able to track bilaterally, able to have bilateral gaze, PERRL, blinking to visual threat bilaterally. Facial symmetrical. Tongue protrusion not cooperative. Did not moving limbs due to lethargy but as per RN pt moved all extremities with bath. Sensation, coordination and gait not tested.   ASSESSMENT/PLAN [name] is a 84 y.o. female with history of back pain, arthritis, depression, diabetes, diastolic CHF, hyperlipidemia, hypertension, prior history of syncope, right knee pain status post total knee replacement presenting from optometrist office with L eye vision loss w/ possible L CRAO.  L BRAO  Diagnosed by ophthalmology  Likely related to left carotid stenosis and atherosclerosis  S/p L CEA 3/12 with Dr. Donnetta Hutching  Continue follow-up with ophthalmology.  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 Small vessel disease. Atrophy.   MRI  L caudate head infarct. Small vessel disease. Atrophy.   MRA head  No ELVO. High-grade VBJ stenoses.  MRA neck L ICA bifurcation 60% stenosis and excavated plaque  CTA  head & neck ICA origin stenosis R < 50%, L 50%. Posterior circulation atherosclerosis - probable plaque R PICA origin, moderate L VA and BA stenosis, short market L superior cerebellar origin stenosis, L PCA P2 mild stenosis.    2D Echo EF 55-60%. No source of embolus   LDL 72  HgbA1c 7.6  Heparin 5000 units sq tid for VTE prophylaxis  clopidogrel 75 mg daily prior to admission, now on aspirin 81 mg daily and clopidogrel 75 mg daily. Continue DAPT for 3 weeks and then plavix alone  Therapy recommendations:  HH PT, RW  Disposition:  pending    Carotid stenosis  MRA neck left ICA bifurcation 60% stenosis with calcified plaque  CTA head and neck ICA origin left 50% stenosis, right < 50% stenosis  Likely source of left caudate head infarct and left  BRAO  S/p L CEA 3/12 with Dr. Donnetta Hutching  Follow up with [facility] as outpt  Hypertension  Stable . Long-term BP goal normotensive  Hyperlipidemia  Home meds:  repatha (statin ineffective)  Placed on lipitor 40 on admission - now off  LDL 72, goal < 70  Continue home repatha at discharge. No indication to continue statin.  Diabetes type II Uncontrolled  HgbA1c 7.6, goal < 7.0  CBGs  SSI  Close PCP follow-up  Other Stroke Risk Factors  Advanced age  Former Cigarette smoker, quit 35 yrs ago  Chronic diastolic Congestive heart failure  PVD  Estrace 0.5 daily - discuss w/ pt and she is in agreement of trial to stop estrogen given thromboembolic risk  Other Active Problems  Renal insufficiency, creatinine 1.33-> 0.92->1.11   Urinary retention   Anxiety, insomnia  Hypokalemia 3.0  Hospital day # 2  Neurology will sign off. Please call with questions. Pt will follow up with Dr. Krista Carr at [facility] in about 4 weeks. Thanks for the consult.  Doris Hawking, MD PhD Stroke Neurology 12/13/2019 9:02 AM   To contact Stroke Continuity provider, please refer to [email or web address] After hours, contact General Neurology

## 2019-12-13 NOTE — Interval H&P Note (Signed)
History and Physical Interval Note:  12/13/2019 10:44 AM  Doris Carr  has presented today for surgery, with the diagnosis of head infarct.  The various methods of treatment have been discussed with the patient and family. After consideration of risks, benefits and other options for treatment, the patient has consented to  Procedure(s): ENDARTERECTOMY CAROTID (Left) as a surgical intervention.  The patient's history has been reviewed, patient examined, no change in status, stable for surgery.  I have reviewed the patient's chart and labs.  Questions were answered to the patient's satisfaction.     Curt Jews

## 2019-12-14 LAB — CBC
HCT: 34.7 % — ABNORMAL LOW (ref 36.0–46.0)
Hemoglobin: 12.1 g/dL (ref 12.0–15.0)
MCH: 34 pg (ref 26.0–34.0)
MCHC: 34.9 g/dL (ref 30.0–36.0)
MCV: 97.5 fL (ref 80.0–100.0)
Platelets: 141 10*3/uL — ABNORMAL LOW (ref 150–400)
RBC: 3.56 MIL/uL — ABNORMAL LOW (ref 3.87–5.11)
RDW: 11.2 % — ABNORMAL LOW (ref 11.5–15.5)
WBC: 9.6 10*3/uL (ref 4.0–10.5)
nRBC: 0 % (ref 0.0–0.2)

## 2019-12-14 LAB — BASIC METABOLIC PANEL
Anion gap: 15 (ref 5–15)
BUN: 23 mg/dL (ref 8–23)
CO2: 26 mmol/L (ref 22–32)
Calcium: 8.7 mg/dL — ABNORMAL LOW (ref 8.9–10.3)
Chloride: 96 mmol/L — ABNORMAL LOW (ref 98–111)
Creatinine, Ser: 1.1 mg/dL — ABNORMAL HIGH (ref 0.44–1.00)
GFR calc Af Amer: 55 mL/min — ABNORMAL LOW (ref >60)
GFR calc non Af Amer: 47 mL/min — ABNORMAL LOW (ref >60)
Glucose, Bld: 217 mg/dL — ABNORMAL HIGH (ref 70–99)
Potassium: 4 mmol/L (ref 3.5–5.1)
Sodium: 137 mmol/L (ref 135–145)

## 2019-12-14 LAB — GLUCOSE, CAPILLARY: Glucose-Capillary: 198 mg/dL — ABNORMAL HIGH (ref 70–99)

## 2019-12-14 MED ORDER — PHENOL 1.4 % MT LIQD: 1.0000 | OROMUCOSAL | 0 refills | Status: AC | PRN

## 2019-12-14 MED ORDER — ASPIRIN 81 MG PO TBEC: 81.0000 mg | DELAYED_RELEASE_TABLET | Freq: Every day | ORAL | Status: AC

## 2019-12-14 MED ORDER — LOSARTAN POTASSIUM 50 MG PO TABS: 50.0000 mg | ORAL_TABLET | Freq: Every day | ORAL | 3 refills | Status: DC

## 2019-12-14 NOTE — TOC Transition Note (Addendum)
Transition of Care [facility]) - CM/SW Discharge Note   Patient Details  Name: Doris Carr MRN: [id number] Date of Birth: 1936/06/10  [facility] CM/SW Contact:  Claudie Leach, RN 12/14/2019, 6:06 PM   Clinical Narrative:    Pt to d/c home with [facility] PT/OT.  Discussed choice with patient and husband.  They have no preference of agency.  Referral called to [facility] with [facility].  Malachy Mood accepted referral but may have to place with another agency.  No other agencies accept HTA at this time.    Final next level of care: [place] Barriers to Discharge: No Barriers Identified   Patient Goals and CMS Choice Patient states their goals for this hospitalization and ongoing recovery are:: to get home CMS Medicare.gov Compare Post Acute Care list provided to:: Patient Choice offered to / list presented to : Patient   Discharge Plan and Services                 HH Arranged: PT, OT [facility] Agency: [place] Date [place]: 12/14/19 Time [place]: [id number] Representative spoke with at [place]: Sharmon Revere

## 2019-12-14 NOTE — Anesthesia Postprocedure Evaluation (Signed)
Anesthesia Post Note  Patient: Doris Carr  Procedure(s) Performed: ENDARTERECTOMY CAROTID (Left Neck) Patch Angioplasty (Left Neck)     Patient location during evaluation: PACU Anesthesia Type: General Level of consciousness: awake and alert Pain management: pain level controlled Vital Signs Assessment: post-procedure vital signs reviewed and stable Respiratory status: spontaneous breathing, nonlabored ventilation, respiratory function stable and patient connected to nasal cannula oxygen Cardiovascular status: blood pressure returned to baseline and stable Postop Assessment: no apparent nausea or vomiting Anesthetic complications: no    Last Vitals:  Vitals:   12/14/19 0739 12/14/19 1400  BP: 138/71 137/63  Pulse: 85   Resp: 20 16  Temp: 36.7 C 37 C  SpO2: 93% 96%    Last Pain:  Vitals:   12/14/19 1400  TempSrc: Oral  PainSc: 2                  Meshulem Onorato COKER

## 2019-12-14 NOTE — Progress Notes (Signed)
Discharge instructions given to Doris Carr.  Discussed signs and symptoms to watch for and when to contact the physician.  Discussed activities and lifting restrictions.  Discussed new medications, medication changes and side effects.  Discussed follow up appointments.  Verbalized understanding.

## 2019-12-14 NOTE — Progress Notes (Addendum)
[facility]  Subjective  - Doing well, other than throat soreness.   Objective 138/71 85 98.1 F (36.7 C) (Oral) 20 93%  Intake/Output Summary (Last 24 hours) at 12/14/2019 [id number] Last data filed at 12/13/2019 1400 Gross per 24 hour  Intake 1250 ml  Output 700 ml  Net 550 ml    Moving all 4 ext., no tongue deviation, and smile is symmetric Palpable radial pulses B UE No left eye vision changes with partial vision loss baseline pre-op Lungs non labored breathing  Assessment/Planning: POD # 1 Left Carotid Endarterectomy with Dacron Patch Angioplasty Symptomatic left ICA occlusion No neurologic deficit changes post op Stable disposition from a vascular point of view.  Will order Chloracetic spray for her throat.  F/U in 2-3 weeks with Dr. Armstead Peaks 12/14/2019 8:38 AM --  Laboratory Lab Results: Recent Labs    12/13/19 0637 12/14/19 0347  WBC 6.0 9.6  HGB 13.1 12.1  HCT 36.8 34.7*  PLT 164 141*   BMET Recent Labs    12/13/19 0637 12/14/19 0347  NA 141 137  K 3.0* 4.0  CL 95* 96*  CO2 30 26  GLUCOSE 140* 217*  BUN 30* 23  CREATININE 1.11* 1.10*  CALCIUM 9.5 8.7*    COAG Lab Results  Component Value Date   INR 1.0 12/13/2019   INR 0.9 12/10/2019   INR 0.99 11/08/2016   No results found for: PTT  I agree with the above.  I have seen and evaluated the patient.  She is postop day #1, status post left carotid endarterectomy.  She has no neurologic deficits.  Her neck incision is healing nicely.  Her only complaint is throat soreness for which we have ordered Chloraseptic spray.  She is stable for discharge and will follow up with Dr. Donnetta Hutching in 2 to 3 weeks.  Doris Carr

## 2019-12-14 NOTE — Plan of Care (Signed)
  Problem: Education: Goal: Knowledge of disease or condition will improve 12/14/2019 1307 by Glenard Haring, RN Outcome: Adequate for Discharge 12/14/2019 1227 by Glenard Haring, RN Outcome: Progressing Goal: Knowledge of secondary prevention will improve 12/14/2019 1307 by Glenard Haring, RN Outcome: Adequate for Discharge 12/14/2019 1227 by Glenard Haring, RN Outcome: Progressing   Problem: Ischemic Stroke/TIA Tissue Perfusion: Goal: Complications of ischemic stroke/TIA will be minimized 12/14/2019 1307 by Glenard Haring, RN Outcome: Adequate for Discharge 12/14/2019 1227 by Glenard Haring, RN Outcome: Progressing

## 2019-12-14 NOTE — Discharge Summary (Signed)
Physician Discharge Summary  KASHALA TOOKER [id number] DOB: 09/22/1936 DOA: 12/10/2019  PCP: Doris Cruel, MD  Admit date: 12/10/2019 Discharge date: 12/14/2019  Admitted From: Home Discharge disposition: Home   Recommendations for Outpatient Follow-Up:   1. Patient being placed on aspirin plus Plavix for 3 weeks then Plavix alone 2. Patient to follow-up with Dr. Donnetta Hutching in 2 to 3 weeks 3. Patient to do trial of time off Estrace as this can be a risk for thromboemboli   Discharge Diagnosis:   Principal Problem:   Acute ischemic stroke [facility]) Active Problems:   Diabetes mellitus with nephropathy ([place])   Anxiety   HTN (hypertension)   Diastolic CHF, chronic (H[facility])   Transient vision disturbance   Renal insufficiency   Acute CVA (cerebrovascular accident) ([place])   Carotid stenosis    Discharge Condition: Improved.  Diet recommendation: Low sodium, heart healthy.  Carbohydrate-modified  Wound care: Per vascular  Code status: Full.   History of Present Illness:   Doris CORRIE is a 84 y.o. female with medical history significant for anxiety, insomnia, chronic diastolic CHF, and osteoarthritis, uses a walker to ambulate at baseline, now presenting to the emergency department for evaluation of visual disturbance.  Patient reports that she been in her usual state of health and was watching a basketball game on TV the night of 12/07/2019 when she saw some "flashes" involving the left eye.  This was brief and has not recurred but she noted the following morning that she had a visual field deficit, involving the left eye only, and described as being unable to see the top of a page when trying to read.  She denies any headaches, chest pain or palpitations, and denies any focal numbness or weakness.  She was seen by optometry, there was concern for retinal artery occlusion, and she was directed to the ED.    Hospital Course by Problem:   L BRAO  Diagnosed by outpatient  ophthalmology  Likely related to left carotid stenosis and atherosclerosis  S/p L CEA 3/12 with Dr. Donnetta Hutching  Stroke:   L caudate head infarct likely secondary to large vessel source  MRI  L caudate head infarct. Small vessel disease. Atrophy.   MRA head  No ELVO. High-grade VBJ stenoses.  MRA neck L ICA bifurcation 60% stenosis and excavated plaque  CTA head & neck ICA origin stenosis R < 50%, L 50%. Posterior circulation atherosclerosis - probable plaque R PICA origin, moderate L VA and BA stenosis, short market L superior cerebellar origin stenosis, L PCA P2 mild stenosis.    2D Echo EF 55-60%. No source of embolus   LDL 72  HgbA1c 7.6  Per neurology: Aspirin 81 mg daily and clopidogrel 75 mg daily. Continue DAPT for 3 weeks and then plavix alone  Patient also to do trial off Estrace as this can be a thromboembolic risk    Carotid stenosis  MRA neck left ICA bifurcation 60% stenosis with calcified plaque  CTA head and neck ICA origin left 50% stenosis, right < 50% stenosis  Likely source of left caudate head infarct and left BRAO  S/p L CEA 3/12 with Dr. Donnetta Hutching  Patient to follow-up in 2 to 3 weeks with Dr. Donnetta Hutching  Hypertension  Stable  Long-term BP goal normotensive  Hyperlipidemia  LDL 72, goal < 70  Continue home repatha at discharge  Diabetes type II Uncontrolled with hyperglycemia  HgbA1c 7.6, goal < 7.0  Defer changes to PCP  Medical Consultants:   Neurology Vascular surgery   Discharge Exam:   Vitals:   12/13/19 2353 12/14/19 0739  BP: (!) 119/53 138/71  Pulse: 74 85  Resp: 14 20  Temp: 97.9 F (36.6 C) 98.1 F (36.7 C)  SpO2: 96% 93%   Vitals:   12/13/19 1921 12/13/19 1957 12/13/19 2353 12/14/19 0739  BP: (!) 131/94 (!) 131/94 (!) 119/53 138/71  Pulse: 73 73 74 85  Resp: 13 13 14 20   Temp: 97.6 F (36.4 C) 97.6 F (36.4 C) 97.9 F (36.6 C) 98.1 F (36.7 C)  TempSrc:  Oral Oral Oral  SpO2: 93% 93% 96% 93%    Weight:      Height:        General exam: Appears calm and comfortable.    The results of significant diagnostics from this hospitalization (including imaging, microbiology, ancillary and laboratory) are listed below for reference.     Procedures and Diagnostic Studies:   CT ANGIO HEAD W OR WO CONTRAST  Result Date: 12/11/2019 CLINICAL DATA:  Abnormal MRA EXAM: CT ANGIOGRAPHY HEAD AND NECK TECHNIQUE: Multidetector CT imaging of the head and neck was performed using the standard protocol during bolus administration of intravenous contrast. Multiplanar CT image reconstructions and MIPs were obtained to evaluate the vascular anatomy. Carotid stenosis measurements (when applicable) are obtained utilizing NASCET criteria, using the distal internal carotid diameter as the denominator. CONTRAST:  75 mL OMNIPAQUE IOHEXOL 350 MG/ML SOLN COMPARISON:  MRA earlier same day FINDINGS: CTA NECK Aortic arch: Calcified and noncalcified plaque the aortic arch. Great vessel origins are patent. Right carotid system: Patent. There is calcified plaque at the ICA origin causing less than 50% stenosis. Left carotid system: Patent. There is calcified plaque at the ICA origin causing approximately 50% stenosis. Vertebral arteries: Patent. Left vertebral artery is slightly dominant. No significant stenosis. Skeleton: Multilevel degenerative changes, greatest at C5-C6 at C6-C7. Other neck: No mass or adenopathy. Upper chest: No apical lung mass. Review of the MIP images confirms the above findings CTA HEAD Anterior circulation: Intracranial internal carotid arteries are patent with calcified plaque causing mild stenosis. Middle and anterior cerebral arteries are patent. Left A1 ACA is dominant. Patent anterior communicating artery. Posterior circulation: Proximal intracranial right vertebral artery is patent. This either terminates as the PICA or becomes diminutive after PICA origin. There is probable plaque at the right PICA  origin. Intracranial left vertebral artery is patent with plaque causing moderate stenosis. Left PICA is patent. Basilar artery is patent atherosclerotic irregularity particularly proximally where there is moderate stenosis. There is short segment marked stenosis at the origin of the left superior cerebellar artery. Posterior cerebral arteries are patent. Mild stenosis of the proximal left P2 PCA. Bilateral posterior communicating arteries are present. Venous sinuses: As permitted by contrast timing, patent. Present on the prior MRI, there is a partially calcified 9 mm meningioma along the left parietal convexity. Review of the MIP images confirms the above findings IMPRESSION: Plaque at the ICA origins causing less than 50% stenosis on the right and approximately 50% stenosis on the left. Posterior circulation intracranial atherosclerosis. Poorly visualized distal intracranial right vertebral artery, which may reflect plaque. Probable plaque at the right PICA origin. Moderate stenosis of the left vertebral and basilar arteries. Short segment marked stenosis at the left superior cerebellar artery origin. Mild stenosis of the proximal left P2 PCA. Electronically Signed   By: Macy Mis M.D.   On: 12/11/2019 10:58   CT HEAD WO CONTRAST  Result Date: 12/10/2019 CLINICAL DATA:  Vision loss EXAM: CT HEAD WITHOUT CONTRAST TECHNIQUE: Contiguous axial images were obtained from the base of the skull through the vertex without intravenous contrast. COMPARISON:  MRI 12/04/2018 FINDINGS: Brain: No acute territorial infarction, hemorrhage, or intracranial mass. Moderate atrophy. Moderate severe hypodensity within the white matter consistent with chronic small vessel ischemic change. Ventricles appear enlarged but similar as compared with 12/04/2018. Vascular: No hyperdense vessels.  Carotid vascular calcification Skull: Normal. Negative for fracture or focal lesion. Sinuses/Orbits: No acute finding. Other: Incomplete  fusion posterior arch of C1 IMPRESSION: 1. No CT evidence for acute intracranial abnormality. 2. Atrophy and chronic small vessel ischemic change of the white matter Electronically Signed   By: Donavan Foil M.D.   On: 12/10/2019 18:13   CT ANGIO NECK W OR WO CONTRAST  Result Date: 12/11/2019 CLINICAL DATA:  Abnormal MRA EXAM: CT ANGIOGRAPHY HEAD AND NECK TECHNIQUE: Multidetector CT imaging of the head and neck was performed using the standard protocol during bolus administration of intravenous contrast. Multiplanar CT image reconstructions and MIPs were obtained to evaluate the vascular anatomy. Carotid stenosis measurements (when applicable) are obtained utilizing NASCET criteria, using the distal internal carotid diameter as the denominator. CONTRAST:  75 mL OMNIPAQUE IOHEXOL 350 MG/ML SOLN COMPARISON:  MRA earlier same day FINDINGS: CTA NECK Aortic arch: Calcified and noncalcified plaque the aortic arch. Great vessel origins are patent. Right carotid system: Patent. There is calcified plaque at the ICA origin causing less than 50% stenosis. Left carotid system: Patent. There is calcified plaque at the ICA origin causing approximately 50% stenosis. Vertebral arteries: Patent. Left vertebral artery is slightly dominant. No significant stenosis. Skeleton: Multilevel degenerative changes, greatest at C5-C6 at C6-C7. Other neck: No mass or adenopathy. Upper chest: No apical lung mass. Review of the MIP images confirms the above findings CTA HEAD Anterior circulation: Intracranial internal carotid arteries are patent with calcified plaque causing mild stenosis. Middle and anterior cerebral arteries are patent. Left A1 ACA is dominant. Patent anterior communicating artery. Posterior circulation: Proximal intracranial right vertebral artery is patent. This either terminates as the PICA or becomes diminutive after PICA origin. There is probable plaque at the right PICA origin. Intracranial left vertebral artery is  patent with plaque causing moderate stenosis. Left PICA is patent. Basilar artery is patent atherosclerotic irregularity particularly proximally where there is moderate stenosis. There is short segment marked stenosis at the origin of the left superior cerebellar artery. Posterior cerebral arteries are patent. Mild stenosis of the proximal left P2 PCA. Bilateral posterior communicating arteries are present. Venous sinuses: As permitted by contrast timing, patent. Present on the prior MRI, there is a partially calcified 9 mm meningioma along the left parietal convexity. Review of the MIP images confirms the above findings IMPRESSION: Plaque at the ICA origins causing less than 50% stenosis on the right and approximately 50% stenosis on the left. Posterior circulation intracranial atherosclerosis. Poorly visualized distal intracranial right vertebral artery, which may reflect plaque. Probable plaque at the right PICA origin. Moderate stenosis of the left vertebral and basilar arteries. Short segment marked stenosis at the left superior cerebellar artery origin. Mild stenosis of the proximal left P2 PCA. Electronically Signed   By: Macy Mis M.D.   On: 12/11/2019 10:58   MR ANGIO HEAD WO CONTRAST  Result Date: 12/11/2019 CLINICAL DATA:  Visual disturbance. Flashing in the left eye. Retinal artery occlusion by fundoscopy, per report EXAM: MRI HEAD WITHOUT CONTRAST MRA HEAD WITHOUT CONTRAST TECHNIQUE:  Multiplanar, multiecho pulse sequences of the brain and surrounding structures were obtained without intravenous contrast. Angiographic images of the head were obtained using MRA technique without contrast. COMPARISON:  Head CT from yesterday and brain MRI from 12/04/2018 FINDINGS: MRI HEAD FINDINGS Brain: Subcentimeter acute infarct at the left caudate head. No optic pathway ischemia. Clear suprasellar cistern. Atrophy with ventriculomegaly. Medial temporal volume loss is at least moderate but congruous with the  generalized volume loss. Chronic small vessel ischemia in the deep white matter which is moderate to extensive. No hemorrhage, obstructive hydrocephalus, or collection. No chronic blood products. Vascular: Arterial findings below. Normal dural venous sinus flow voids Skull and upper cervical spine: Negative for marrow lesion. There is upper cervical degenerative disease with C2-3 anterolisthesis. Sinuses/Orbits: Bilateral cataract resection. No evidence of intra-ocular collection or orbital inflammation. MRA HEAD FINDINGS Symmetric vertebral and carotid artery size. Hypoplastic right A1 segment. Flow gap in the distal right vertebral artery from stenosis and likely accentuated by vessel tortuosity. There is also high-grade narrowing of the proximal basilar. Posterior circulation branch vessels are patent. Robust posterior communicating arteries. Hypoplastic right A1 segment. Negative for aneurysm or branch occlusion. Symmetric signal in the proximal ophthalmic arteries bilaterally. IMPRESSION: Brain MRI: 1. Acute lacunar infarct at the left caudate head. 2. Generalized atrophy and extensive hemispheric small vessel disease without significant change compared to brain MRI 1 year ago. Intracranial MRA: 1. No emergent finding. No notable narrowing or luminal irregularity at the level of the left cavernous ICA. 2. High-grade vertebrobasilar stenoses with robust posterior communicating arteries. Electronically Signed   By: Monte Fantasia M.D.   On: 12/11/2019 06:36   MR Angiogram Neck W or Wo Contrast  Result Date: 12/11/2019 CLINICAL DATA:  Visual changes in the left eye. ER referral for retinal artery occlusion EXAM: MRA NECK WITHOUT AND WITH CONTRAST TECHNIQUE: Multiplanar and multiecho pulse sequences of the neck were obtained without and with intravenous contrast. Angiographic images of the neck were obtained using MRA technique without and with intravenous contrast. CONTRAST:  6.84mL GADAVIST GADOBUTROL 1  MMOL/ML IV SOLN COMPARISON:  None. FINDINGS: Time-of-flight imaging shows antegrade flow in both carotid and vertebral arteries. This series is degraded by motion. Postcontrast imaging is complicated by patient activating squeeze ball during bolus injections such that images are early and late relative to the arterial phase. Based on source images and accounting for venous contamination: The arch is negative with 3 vessel branching. The bilateral systems show tortuosity with asymmetric left-sided atherosclerotic plaque at the bifurcation leading the 60% luminal stenosis. Plaque at the bulb is excavated, of indeterminate chronicity. No dissection is seen. No proximal subclavian stenosis. Vertebral artery assessment is notably limited by the degree of venous contamination, with no occlusion or flow limiting stenosis seen in the neck. There is occlusion of the distal right V4 segment and high-grade narrowing of the proximal basilar as described on dedicated intracranial MRA. IMPRESSION: 1. Limited study due to patient factors, as above. 2. Atherosclerosis with asymmetric involvement at the left carotid bifurcation where there is 60% luminal stenosis and excavated plaque which could relate to the patient's history. Electronically Signed   By: Monte Fantasia M.D.   On: 12/11/2019 07:18   MR BRAIN WO CONTRAST  Result Date: 12/11/2019 CLINICAL DATA:  Visual disturbance. Flashing in the left eye. Retinal artery occlusion by fundoscopy, per report EXAM: MRI HEAD WITHOUT CONTRAST MRA HEAD WITHOUT CONTRAST TECHNIQUE: Multiplanar, multiecho pulse sequences of the brain and surrounding structures were obtained without intravenous  contrast. Angiographic images of the head were obtained using MRA technique without contrast. COMPARISON:  Head CT from yesterday and brain MRI from 12/04/2018 FINDINGS: MRI HEAD FINDINGS Brain: Subcentimeter acute infarct at the left caudate head. No optic pathway ischemia. Clear suprasellar  cistern. Atrophy with ventriculomegaly. Medial temporal volume loss is at least moderate but congruous with the generalized volume loss. Chronic small vessel ischemia in the deep white matter which is moderate to extensive. No hemorrhage, obstructive hydrocephalus, or collection. No chronic blood products. Vascular: Arterial findings below. Normal dural venous sinus flow voids Skull and upper cervical spine: Negative for marrow lesion. There is upper cervical degenerative disease with C2-3 anterolisthesis. Sinuses/Orbits: Bilateral cataract resection. No evidence of intra-ocular collection or orbital inflammation. MRA HEAD FINDINGS Symmetric vertebral and carotid artery size. Hypoplastic right A1 segment. Flow gap in the distal right vertebral artery from stenosis and likely accentuated by vessel tortuosity. There is also high-grade narrowing of the proximal basilar. Posterior circulation branch vessels are patent. Robust posterior communicating arteries. Hypoplastic right A1 segment. Negative for aneurysm or branch occlusion. Symmetric signal in the proximal ophthalmic arteries bilaterally. IMPRESSION: Brain MRI: 1. Acute lacunar infarct at the left caudate head. 2. Generalized atrophy and extensive hemispheric small vessel disease without significant change compared to brain MRI 1 year ago. Intracranial MRA: 1. No emergent finding. No notable narrowing or luminal irregularity at the level of the left cavernous ICA. 2. High-grade vertebrobasilar stenoses with robust posterior communicating arteries. Electronically Signed   By: Monte Fantasia M.D.   On: 12/11/2019 06:36   ECHOCARDIOGRAM COMPLETE  Result Date: 12/11/2019    ECHOCARDIOGRAM REPORT   Patient Name:   BRAZIL PECORELLA Inoue Date of Exam: 12/11/2019 Medical Rec #:  [id number]    Height:       65.0 in Accession #:    [id number]   Weight:       140.0 lb Date of Birth:  1936/08/23    BSA:          1.700 m Patient Age:    25 years     BP:           143/76 mmHg  Patient Gender: F            HR:           67 bpm. Exam Location:  Inpatient Procedure: 2D Echo, Cardiac Doppler and Color Doppler Indications:    TIA 435.9 / G45.9  History:        Patient has prior history of Echocardiogram examinations, most                 recent 05/27/2011. CHF, Arrythmias:LBBB, Signs/Symptoms:Syncope;                 Risk Factors:Hypertension, Diabetes, Dyslipidemia and GERD. CKD.  Sonographer:    Jonelle Sidle Dance Referring Phys: [id number] [place]  1. Left ventricular ejection fraction, by estimation, is 55 to 60%. The left ventricle has normal function. The left ventricle has no regional wall motion abnormalities. Left ventricular diastolic parameters are consistent with Grade I diastolic dysfunction (impaired relaxation).  2. Right ventricular systolic function is normal. The right ventricular size is normal.  3. The mitral valve is grossly normal. No evidence of mitral valve regurgitation.  4. The aortic valve is tricuspid. Aortic valve regurgitation is not visualized.  5. The inferior vena cava is normal in size with greater than 50% respiratory variability, suggesting right atrial pressure of 3 mmHg. FINDINGS  Left Ventricle: Left ventricular ejection fraction, by estimation, is 55 to 60%. The left ventricle has normal function. The left ventricle has no regional wall motion abnormalities. The left ventricular internal cavity size was normal in size. There is  no left ventricular hypertrophy. Left ventricular diastolic parameters are consistent with Grade I diastolic dysfunction (impaired relaxation). Indeterminate filling pressures. Right Ventricle: The right ventricular size is normal. No increase in right ventricular wall thickness. Right ventricular systolic function is normal. Left Atrium: Left atrial size was normal in size. Right Atrium: Right atrial size was normal in size. Pericardium: There is no evidence of pericardial effusion. Presence of pericardial fat pad.  Mitral Valve: The mitral valve is grossly normal. No evidence of mitral valve regurgitation. Tricuspid Valve: The tricuspid valve is grossly normal. Tricuspid valve regurgitation is not demonstrated. Aortic Valve: The aortic valve is tricuspid. Aortic valve regurgitation is not visualized. Pulmonic Valve: The pulmonic valve was grossly normal. Pulmonic valve regurgitation is not visualized. Aorta: The aortic root, ascending aorta, aortic arch and descending aorta are all structurally normal, with no evidence of dilitation or obstruction. Venous: The inferior vena cava is normal in size with greater than 50% respiratory variability, suggesting right atrial pressure of 3 mmHg. IAS/Shunts: The interatrial septum was not well visualized.  LEFT VENTRICLE PLAX 2D LVIDd:         4.90 cm  Diastology LVIDs:         3.50 cm  LV e' lateral:   3.81 cm/s LV PW:         1.40 cm  LV E/e' lateral: 1.1 LV IVS:        0.70 cm LVOT diam:     2.10 cm LV SV:         82 LV SV Index:   48 LVOT Area:     3.46 cm  RIGHT VENTRICLE             IVC RV Basal diam:  1.90 cm     IVC diam: 2.00 cm RV S prime:     10.10 cm/s TAPSE (M-mode): 1.8 cm LEFT ATRIUM         Index LA diam:    3.10 cm 1.82 cm/m  AORTIC VALVE LVOT Vmax:   101.00 cm/s LVOT Vmean:  67.600 cm/s LVOT VTI:    0.238 m  AORTA Ao Root diam: 3.50 cm Ao Asc diam:  2.80 cm MV E velocity: 4.03 cm/s MV A velocity: 98.05 cm/s  SHUNTS MV E/A ratio:  0.04        Systemic VTI:  0.24 m                            Systemic Diam: 2.10 cm Lyman Bishop MD Electronically signed by Lyman Bishop MD Signature Date/Time: 12/11/2019/10:57:01 AM    Final      Labs:   Basic Metabolic Panel: Recent Labs  Lab 12/10/19 1815 12/10/19 1815 12/11/19 1029 12/11/19 1029 12/13/19 0637 12/14/19 0347  NA 139  --  136  --  141 137  K 3.4*   < > 3.2*   < > 3.0* 4.0  CL 96*  --  94*  --  95* 96*  CO2 28  --  29  --  30 26  GLUCOSE 164*  --  182*  --  140* 217*  BUN 39*  --  27*  --  30* 23    CREATININE 1.33*  --  0.92  --  1.11* 1.10*  CALCIUM 9.7  --  9.0  --  9.5 8.7*   < > = values in this interval not displayed.   GFR Estimated Creatinine Clearance: 36.1 mL/min (A) (by C-G formula based on SCr of 1.1 mg/dL (H)). Liver Function Tests: Recent Labs  Lab 12/10/19 1815  AST 19  ALT 11  ALKPHOS 43  BILITOT 0.8  PROT 6.0*  ALBUMIN 3.8   No results for input(s): LIPASE, AMYLASE in the last 168 hours. No results for input(s): AMMONIA in the last 168 hours. Coagulation profile Recent Labs  Lab 12/10/19 1815 12/13/19 0637  INR 0.9 1.0    CBC: Recent Labs  Lab 12/10/19 1815 12/13/19 0637 12/14/19 0347  WBC 6.3 6.0 9.6  NEUTROABS 3.8  --   --   HGB 13.5 13.1 12.1  HCT 38.5 36.8 34.7*  MCV 97.0 95.8 97.5  PLT 161 164 141*   Cardiac Enzymes: No results for input(s): CKTOTAL, CKMB, CKMBINDEX, TROPONINI in the last 168 hours. BNP: Invalid input(s): POCBNP CBG: Recent Labs  Lab 12/13/19 1030 12/13/19 1358 12/13/19 1719 12/13/19 2145 12/14/19 0639  GLUCAP 125* 180* 221* 211* 198*   D-Dimer No results for input(s): DDIMER in the last 72 hours. Hgb A1c No results for input(s): HGBA1C in the last 72 hours. Lipid Profile No results for input(s): CHOL, HDL, LDLCALC, TRIG, CHOLHDL, LDLDIRECT in the last 72 hours. Thyroid function studies No results for input(s): TSH, T4TOTAL, T3FREE, THYROIDAB in the last 72 hours.  Invalid input(s): FREET3 Anemia work up No results for input(s): VITAMINB12, FOLATE, FERRITIN, TIBC, IRON, RETICCTPCT in the last 72 hours. Microbiology Recent Results (from the past 240 hour(s))  SARS CORONAVIRUS 2 (TAT 6-24 HRS) Nasopharyngeal Nasopharyngeal Swab     Status: None   Collection Time: 12/10/19  8:12 PM   Specimen: Nasopharyngeal Swab  Result Value Ref Range Status   SARS Coronavirus 2 NEGATIVE NEGATIVE Final    Comment: (NOTE) SARS-CoV-2 target nucleic acids are NOT DETECTED. The SARS-CoV-2 RNA is generally detectable in  upper and lower respiratory specimens during the acute phase of infection. Negative results do not preclude SARS-CoV-2 infection, do not rule out co-infections with other pathogens, and should not be used as the sole basis for treatment or other patient management decisions. Negative results must be combined with clinical observations, patient history, and epidemiological information. The expected result is Negative. Fact Sheet for Patients: SugarRoll.be Fact Sheet for Healthcare Providers: https://www.woods-mathews.com/ This test is not yet approved or cleared by the [place] FDA and  has been authorized for detection and/or diagnosis of SARS-CoV-2 by FDA under an Emergency Use Authorization (EUA). This EUA will remain  in effect (meaning this test can be used) for the duration of the COVID-19 declaration under Section 56 4(b)(1) of the Act, 21 U.S.C. section 360bbb-3(b)(1), unless the authorization is terminated or revoked sooner. Performed at [facility], [place] [address] [place], [place] [address]   Surgical pcr screen     Status: None   Collection Time: 12/13/19  7:28 AM   Specimen: Nasal Mucosa; Nasal Swab  Result Value Ref Range Status   MRSA, PCR NEGATIVE NEGATIVE Final   Staphylococcus aureus NEGATIVE NEGATIVE Final    Comment: (NOTE) The Xpert SA Assay (FDA approved for NASAL specimens in patients 17 years of age and older), is one component of a comprehensive surveillance program. It is not intended to diagnose infection nor to guide or monitor treatment. Performed at  [facility], [place] [address] [place], [place] [address]      Discharge Instructions:   Discharge Instructions    Ambulatory referral to Neurology   Complete by: As directed    An appointment is requested in approximately: 4 weeks. Pt is Dr. Rhea Belton pt in the past.   Diet - low sodium heart healthy   Complete by: As directed    Diet Carb  Modified   Complete by: As directed    Discharge instructions   Complete by: As directed    aspirin 81 mg daily and clopidogrel 75 mg daily. Continue DAPT for 3 weeks and then plavix alone Home health   Increase activity slowly   Complete by: As directed      Allergies as of 12/14/2019      Reactions   Hydralazine Other (See Comments)   Unsure of reaction   Ciprofloxacin Nausea And Vomiting   Codeine Nausea And Vomiting   Darvocet [propoxyphene N-acetaminophen] Nausea Only   Darvon Nausea Only   Hydrocodone Nausea And Vomiting   Morphine And Related Nausea And Vomiting   Pentazocine Lactate Nausea And Vomiting   Potassium-containing Compounds Nausea Only   Procaine Hcl Nausea And Vomiting   Sulfa Drugs Cross Reactors Rash      Medication List    STOP taking these medications   ALPRAZolam 1 MG tablet Commonly known as: XANAX   chlorthalidone 25 MG tablet Commonly known as: HYGROTON   estradiol 0.5 MG tablet Commonly known as: ESTRACE     TAKE these medications   acetaminophen 650 MG CR tablet Commonly known as: TYLENOL Take 650 mg by mouth 2 (two) times daily as needed (for arthritis pain.).   amLODipine 5 MG tablet Commonly known as: NORVASC Take 5 mg by mouth 2 (two) times daily.   aspirin 81 MG EC tablet Take 1 tablet (81 mg total) by mouth daily. Start taking on: December 15, 2019   carvedilol 25 MG tablet Commonly known as: COREG Take 1 tablet (25 mg total) by mouth 2 (two) times daily.   citalopram 20 MG tablet Commonly known as: CELEXA Take 20 mg by mouth daily.   clonazePAM 0.5 MG tablet Commonly known as: KLONOPIN Take 0.25-0.5 mg by mouth 2 (two) times daily. 0.25 mg in the morning, 0.5 mg in the evening   clopidogrel 75 MG tablet Commonly known as: PLAVIX Take 1 tablet (75 mg total) by mouth daily.   furosemide 20 MG tablet Commonly known as: LASIX Take 20 mg by mouth.   losartan 50 MG tablet Commonly known as: COZAAR Take 1 tablet (50  mg total) by mouth at bedtime. Start taking on: December 16, 2019 What changed: These instructions start on December 16, 2019. If you are unsure what to do until then, ask your doctor or other care provider.   metFORMIN 500 MG tablet Commonly known as: GLUCOPHAGE Take 500 mg by mouth at bedtime.   ondansetron 4 MG tablet Commonly known as: Zofran Take 1 tablet (4 mg total) by mouth daily as needed for nausea or vomiting.   oxyCODONE-acetaminophen 5-325 MG tablet Commonly known as: Percocet Take 1 tablet by mouth every 4 (four) hours as needed for severe pain.   phenol 1.4 % Liqd Commonly known as: CHLORASEPTIC Use as directed 1 spray in the mouth or throat as needed for throat irritation / pain.   Repatha SureClick XX123456 MG/ML Soaj Generic drug: Evolocumab Inject 140 mg into the skin every 14 (fourteen) days.  traZODone 50 MG tablet Commonly known as: DESYREL Take 50 mg by mouth at bedtime. HALF A PILL AT BEDTIME   VITAMIN B-12 PO Take 1 tablet by mouth daily.      Follow-up Information    Early, Arvilla Meres, MD.   Specialties: Vascular Surgery, Cardiology Why: 2-3 week post op visit. The office will call the patient with an appointment (sent) Contact information: [place] [address] [phone]        Marcial Pacas, MD. Schedule an appointment as soon as possible for a visit in 4 week(s).   Specialty: Neurology Contact information: [address] [place] [address] [phone]        Doris Cruel, MD Follow up in 1 week(s).   Specialty: Family Medicine Why: BP check Contact information: [place] [address] [phone]            Time coordinating discharge: 35 minutes  Signed:  Geradine Girt DO  [facility] 12/14/2019, 11:32 AM

## 2019-12-16 ENCOUNTER — Encounter: Payer: Self-pay | Admitting: *Deleted

## 2019-12-19 DIAGNOSIS — Z7984 Long term (current) use of oral hypoglycemic drugs: Secondary | ICD-10-CM | POA: Diagnosis not present

## 2019-12-19 DIAGNOSIS — E7800 Pure hypercholesterolemia, unspecified: Secondary | ICD-10-CM | POA: Diagnosis not present

## 2019-12-19 DIAGNOSIS — N183 Chronic kidney disease, stage 3 unspecified: Secondary | ICD-10-CM | POA: Diagnosis not present

## 2019-12-19 DIAGNOSIS — I10 Essential (primary) hypertension: Secondary | ICD-10-CM | POA: Diagnosis not present

## 2019-12-19 DIAGNOSIS — M19041 Primary osteoarthritis, right hand: Secondary | ICD-10-CM | POA: Diagnosis not present

## 2019-12-19 DIAGNOSIS — M1990 Unspecified osteoarthritis, unspecified site: Secondary | ICD-10-CM | POA: Diagnosis not present

## 2019-12-19 DIAGNOSIS — G4700 Insomnia, unspecified: Secondary | ICD-10-CM | POA: Diagnosis not present

## 2019-12-19 DIAGNOSIS — E1142 Type 2 diabetes mellitus with diabetic polyneuropathy: Secondary | ICD-10-CM | POA: Diagnosis not present

## 2019-12-19 NOTE — Addendum Note (Signed)
Addendum  created 12/19/19 0735 by Josephine Igo, CRNA   Intraprocedure Event deleted, Intraprocedure Event edited

## 2019-12-20 DIAGNOSIS — Z961 Presence of intraocular lens: Secondary | ICD-10-CM | POA: Diagnosis not present

## 2019-12-20 DIAGNOSIS — H34232 Retinal artery branch occlusion, left eye: Secondary | ICD-10-CM | POA: Diagnosis not present

## 2019-12-26 DIAGNOSIS — I639 Cerebral infarction, unspecified: Secondary | ICD-10-CM | POA: Diagnosis not present

## 2019-12-26 DIAGNOSIS — K219 Gastro-esophageal reflux disease without esophagitis: Secondary | ICD-10-CM | POA: Diagnosis not present

## 2019-12-26 DIAGNOSIS — I5032 Chronic diastolic (congestive) heart failure: Secondary | ICD-10-CM | POA: Diagnosis not present

## 2019-12-26 DIAGNOSIS — I69398 Other sequelae of cerebral infarction: Secondary | ICD-10-CM | POA: Diagnosis not present

## 2019-12-26 DIAGNOSIS — Z09 Encounter for follow-up examination after completed treatment for conditions other than malignant neoplasm: Secondary | ICD-10-CM | POA: Diagnosis not present

## 2019-12-26 DIAGNOSIS — I10 Essential (primary) hypertension: Secondary | ICD-10-CM | POA: Diagnosis not present

## 2019-12-26 DIAGNOSIS — Z48812 Encounter for surgical aftercare following surgery on the circulatory system: Secondary | ICD-10-CM | POA: Diagnosis not present

## 2019-12-26 DIAGNOSIS — F419 Anxiety disorder, unspecified: Secondary | ICD-10-CM | POA: Diagnosis not present

## 2019-12-26 DIAGNOSIS — E785 Hyperlipidemia, unspecified: Secondary | ICD-10-CM | POA: Diagnosis not present

## 2019-12-26 DIAGNOSIS — I679 Cerebrovascular disease, unspecified: Secondary | ICD-10-CM | POA: Diagnosis not present

## 2019-12-26 DIAGNOSIS — Z96651 Presence of right artificial knee joint: Secondary | ICD-10-CM | POA: Diagnosis not present

## 2019-12-26 DIAGNOSIS — Z955 Presence of coronary angioplasty implant and graft: Secondary | ICD-10-CM | POA: Diagnosis not present

## 2019-12-26 DIAGNOSIS — Z7902 Long term (current) use of antithrombotics/antiplatelets: Secondary | ICD-10-CM | POA: Diagnosis not present

## 2019-12-26 DIAGNOSIS — E1151 Type 2 diabetes mellitus with diabetic peripheral angiopathy without gangrene: Secondary | ICD-10-CM | POA: Diagnosis not present

## 2019-12-26 DIAGNOSIS — Z7982 Long term (current) use of aspirin: Secondary | ICD-10-CM | POA: Diagnosis not present

## 2019-12-26 DIAGNOSIS — M1990 Unspecified osteoarthritis, unspecified site: Secondary | ICD-10-CM | POA: Diagnosis not present

## 2019-12-26 DIAGNOSIS — H5462 Unqualified visual loss, left eye, normal vision right eye: Secondary | ICD-10-CM | POA: Diagnosis not present

## 2019-12-26 DIAGNOSIS — M545 Low back pain: Secondary | ICD-10-CM | POA: Diagnosis not present

## 2019-12-26 DIAGNOSIS — Z7984 Long term (current) use of oral hypoglycemic drugs: Secondary | ICD-10-CM | POA: Diagnosis not present

## 2019-12-26 DIAGNOSIS — I447 Left bundle-branch block, unspecified: Secondary | ICD-10-CM | POA: Diagnosis not present

## 2019-12-26 DIAGNOSIS — R32 Unspecified urinary incontinence: Secondary | ICD-10-CM | POA: Diagnosis not present

## 2019-12-26 DIAGNOSIS — I130 Hypertensive heart and chronic kidney disease with heart failure and stage 1 through stage 4 chronic kidney disease, or unspecified chronic kidney disease: Secondary | ICD-10-CM | POA: Diagnosis not present

## 2019-12-26 DIAGNOSIS — H348322 Tributary (branch) retinal vein occlusion, left eye, stable: Secondary | ICD-10-CM | POA: Diagnosis not present

## 2019-12-26 DIAGNOSIS — E1122 Type 2 diabetes mellitus with diabetic chronic kidney disease: Secondary | ICD-10-CM | POA: Diagnosis not present

## 2019-12-26 DIAGNOSIS — F329 Major depressive disorder, single episode, unspecified: Secondary | ICD-10-CM | POA: Diagnosis not present

## 2019-12-26 DIAGNOSIS — K4090 Unilateral inguinal hernia, without obstruction or gangrene, not specified as recurrent: Secondary | ICD-10-CM | POA: Diagnosis not present

## 2019-12-26 DIAGNOSIS — E1142 Type 2 diabetes mellitus with diabetic polyneuropathy: Secondary | ICD-10-CM | POA: Diagnosis not present

## 2019-12-26 DIAGNOSIS — I6521 Occlusion and stenosis of right carotid artery: Secondary | ICD-10-CM | POA: Diagnosis not present

## 2019-12-26 DIAGNOSIS — N183 Chronic kidney disease, stage 3 unspecified: Secondary | ICD-10-CM | POA: Diagnosis not present

## 2019-12-26 DIAGNOSIS — G4700 Insomnia, unspecified: Secondary | ICD-10-CM | POA: Diagnosis not present

## 2019-12-27 ENCOUNTER — Telehealth: Payer: Self-pay | Admitting: Cardiovascular Disease

## 2019-12-27 NOTE — Telephone Encounter (Signed)
New Message    I called pt to schedule recall appt.  She is wondering if she needs to follow up with Dr Burt Knack or with Dr Gwenlyn Found    Please advise

## 2019-12-27 NOTE — Telephone Encounter (Signed)
The patient previously stated she only wanted to follow with Dr. Gwenlyn Found. Dr. Gwenlyn Found saw her in September 2020 and at that time said he wanted to see her back in a year. Spoke with the patient's husband (DPR). Informed him she will get a letter to scheduled with Dr. Gwenlyn Found when his schedule for September is available.

## 2019-12-31 DIAGNOSIS — F419 Anxiety disorder, unspecified: Secondary | ICD-10-CM | POA: Diagnosis not present

## 2019-12-31 DIAGNOSIS — Z7984 Long term (current) use of oral hypoglycemic drugs: Secondary | ICD-10-CM | POA: Diagnosis not present

## 2019-12-31 DIAGNOSIS — Z96651 Presence of right artificial knee joint: Secondary | ICD-10-CM | POA: Diagnosis not present

## 2019-12-31 DIAGNOSIS — E1122 Type 2 diabetes mellitus with diabetic chronic kidney disease: Secondary | ICD-10-CM | POA: Diagnosis not present

## 2019-12-31 DIAGNOSIS — I5032 Chronic diastolic (congestive) heart failure: Secondary | ICD-10-CM | POA: Diagnosis not present

## 2019-12-31 DIAGNOSIS — Z955 Presence of coronary angioplasty implant and graft: Secondary | ICD-10-CM | POA: Diagnosis not present

## 2019-12-31 DIAGNOSIS — M545 Low back pain: Secondary | ICD-10-CM | POA: Diagnosis not present

## 2019-12-31 DIAGNOSIS — I447 Left bundle-branch block, unspecified: Secondary | ICD-10-CM | POA: Diagnosis not present

## 2019-12-31 DIAGNOSIS — G4700 Insomnia, unspecified: Secondary | ICD-10-CM | POA: Diagnosis not present

## 2019-12-31 DIAGNOSIS — H5462 Unqualified visual loss, left eye, normal vision right eye: Secondary | ICD-10-CM | POA: Diagnosis not present

## 2019-12-31 DIAGNOSIS — Z7982 Long term (current) use of aspirin: Secondary | ICD-10-CM | POA: Diagnosis not present

## 2019-12-31 DIAGNOSIS — F329 Major depressive disorder, single episode, unspecified: Secondary | ICD-10-CM | POA: Diagnosis not present

## 2019-12-31 DIAGNOSIS — E1151 Type 2 diabetes mellitus with diabetic peripheral angiopathy without gangrene: Secondary | ICD-10-CM | POA: Diagnosis not present

## 2019-12-31 DIAGNOSIS — Z48812 Encounter for surgical aftercare following surgery on the circulatory system: Secondary | ICD-10-CM | POA: Diagnosis not present

## 2019-12-31 DIAGNOSIS — I130 Hypertensive heart and chronic kidney disease with heart failure and stage 1 through stage 4 chronic kidney disease, or unspecified chronic kidney disease: Secondary | ICD-10-CM | POA: Diagnosis not present

## 2019-12-31 DIAGNOSIS — I69398 Other sequelae of cerebral infarction: Secondary | ICD-10-CM | POA: Diagnosis not present

## 2019-12-31 DIAGNOSIS — M1990 Unspecified osteoarthritis, unspecified site: Secondary | ICD-10-CM | POA: Diagnosis not present

## 2019-12-31 DIAGNOSIS — N183 Chronic kidney disease, stage 3 unspecified: Secondary | ICD-10-CM | POA: Diagnosis not present

## 2019-12-31 DIAGNOSIS — K4090 Unilateral inguinal hernia, without obstruction or gangrene, not specified as recurrent: Secondary | ICD-10-CM | POA: Diagnosis not present

## 2019-12-31 DIAGNOSIS — R32 Unspecified urinary incontinence: Secondary | ICD-10-CM | POA: Diagnosis not present

## 2019-12-31 DIAGNOSIS — K219 Gastro-esophageal reflux disease without esophagitis: Secondary | ICD-10-CM | POA: Diagnosis not present

## 2019-12-31 DIAGNOSIS — H348322 Tributary (branch) retinal vein occlusion, left eye, stable: Secondary | ICD-10-CM | POA: Diagnosis not present

## 2019-12-31 DIAGNOSIS — E785 Hyperlipidemia, unspecified: Secondary | ICD-10-CM | POA: Diagnosis not present

## 2019-12-31 DIAGNOSIS — Z7902 Long term (current) use of antithrombotics/antiplatelets: Secondary | ICD-10-CM | POA: Diagnosis not present

## 2019-12-31 DIAGNOSIS — I6521 Occlusion and stenosis of right carotid artery: Secondary | ICD-10-CM | POA: Diagnosis not present

## 2020-01-01 DIAGNOSIS — M1990 Unspecified osteoarthritis, unspecified site: Secondary | ICD-10-CM | POA: Diagnosis not present

## 2020-01-01 DIAGNOSIS — Z96651 Presence of right artificial knee joint: Secondary | ICD-10-CM | POA: Diagnosis not present

## 2020-01-01 DIAGNOSIS — R32 Unspecified urinary incontinence: Secondary | ICD-10-CM | POA: Diagnosis not present

## 2020-01-01 DIAGNOSIS — I69398 Other sequelae of cerebral infarction: Secondary | ICD-10-CM | POA: Diagnosis not present

## 2020-01-01 DIAGNOSIS — Z7982 Long term (current) use of aspirin: Secondary | ICD-10-CM | POA: Diagnosis not present

## 2020-01-01 DIAGNOSIS — E1151 Type 2 diabetes mellitus with diabetic peripheral angiopathy without gangrene: Secondary | ICD-10-CM | POA: Diagnosis not present

## 2020-01-01 DIAGNOSIS — F329 Major depressive disorder, single episode, unspecified: Secondary | ICD-10-CM | POA: Diagnosis not present

## 2020-01-01 DIAGNOSIS — I447 Left bundle-branch block, unspecified: Secondary | ICD-10-CM | POA: Diagnosis not present

## 2020-01-01 DIAGNOSIS — Z7984 Long term (current) use of oral hypoglycemic drugs: Secondary | ICD-10-CM | POA: Diagnosis not present

## 2020-01-01 DIAGNOSIS — Z955 Presence of coronary angioplasty implant and graft: Secondary | ICD-10-CM | POA: Diagnosis not present

## 2020-01-01 DIAGNOSIS — K4090 Unilateral inguinal hernia, without obstruction or gangrene, not specified as recurrent: Secondary | ICD-10-CM | POA: Diagnosis not present

## 2020-01-01 DIAGNOSIS — M545 Low back pain: Secondary | ICD-10-CM | POA: Diagnosis not present

## 2020-01-01 DIAGNOSIS — Z7902 Long term (current) use of antithrombotics/antiplatelets: Secondary | ICD-10-CM | POA: Diagnosis not present

## 2020-01-01 DIAGNOSIS — E1122 Type 2 diabetes mellitus with diabetic chronic kidney disease: Secondary | ICD-10-CM | POA: Diagnosis not present

## 2020-01-01 DIAGNOSIS — G4700 Insomnia, unspecified: Secondary | ICD-10-CM | POA: Diagnosis not present

## 2020-01-01 DIAGNOSIS — Z48812 Encounter for surgical aftercare following surgery on the circulatory system: Secondary | ICD-10-CM | POA: Diagnosis not present

## 2020-01-01 DIAGNOSIS — H348322 Tributary (branch) retinal vein occlusion, left eye, stable: Secondary | ICD-10-CM | POA: Diagnosis not present

## 2020-01-01 DIAGNOSIS — K219 Gastro-esophageal reflux disease without esophagitis: Secondary | ICD-10-CM | POA: Diagnosis not present

## 2020-01-01 DIAGNOSIS — I5032 Chronic diastolic (congestive) heart failure: Secondary | ICD-10-CM | POA: Diagnosis not present

## 2020-01-01 DIAGNOSIS — N183 Chronic kidney disease, stage 3 unspecified: Secondary | ICD-10-CM | POA: Diagnosis not present

## 2020-01-01 DIAGNOSIS — H5462 Unqualified visual loss, left eye, normal vision right eye: Secondary | ICD-10-CM | POA: Diagnosis not present

## 2020-01-01 DIAGNOSIS — E785 Hyperlipidemia, unspecified: Secondary | ICD-10-CM | POA: Diagnosis not present

## 2020-01-01 DIAGNOSIS — I130 Hypertensive heart and chronic kidney disease with heart failure and stage 1 through stage 4 chronic kidney disease, or unspecified chronic kidney disease: Secondary | ICD-10-CM | POA: Diagnosis not present

## 2020-01-01 DIAGNOSIS — F419 Anxiety disorder, unspecified: Secondary | ICD-10-CM | POA: Diagnosis not present

## 2020-01-01 DIAGNOSIS — I6521 Occlusion and stenosis of right carotid artery: Secondary | ICD-10-CM | POA: Diagnosis not present

## 2020-01-06 ENCOUNTER — Telehealth (HOSPITAL_COMMUNITY): Payer: Self-pay

## 2020-01-06 NOTE — Telephone Encounter (Signed)

## 2020-01-07 ENCOUNTER — Other Ambulatory Visit: Payer: Self-pay

## 2020-01-07 ENCOUNTER — Encounter: Payer: Self-pay | Admitting: Vascular Surgery

## 2020-01-07 ENCOUNTER — Ambulatory Visit (INDEPENDENT_AMBULATORY_CARE_PROVIDER_SITE_OTHER): Payer: Self-pay | Admitting: Vascular Surgery

## 2020-01-07 VITALS — BP 116/73 | HR 83 | Temp 98.2°F | Resp 20 | Ht 65.0 in | Wt 140.0 lb

## 2020-01-07 DIAGNOSIS — M545 Low back pain: Secondary | ICD-10-CM | POA: Diagnosis not present

## 2020-01-07 DIAGNOSIS — I69398 Other sequelae of cerebral infarction: Secondary | ICD-10-CM | POA: Diagnosis not present

## 2020-01-07 DIAGNOSIS — Z48812 Encounter for surgical aftercare following surgery on the circulatory system: Secondary | ICD-10-CM | POA: Diagnosis not present

## 2020-01-07 DIAGNOSIS — H539 Unspecified visual disturbance: Secondary | ICD-10-CM

## 2020-01-07 DIAGNOSIS — E785 Hyperlipidemia, unspecified: Secondary | ICD-10-CM | POA: Diagnosis not present

## 2020-01-07 DIAGNOSIS — H5462 Unqualified visual loss, left eye, normal vision right eye: Secondary | ICD-10-CM | POA: Diagnosis not present

## 2020-01-07 DIAGNOSIS — I447 Left bundle-branch block, unspecified: Secondary | ICD-10-CM | POA: Diagnosis not present

## 2020-01-07 DIAGNOSIS — K219 Gastro-esophageal reflux disease without esophagitis: Secondary | ICD-10-CM | POA: Diagnosis not present

## 2020-01-07 DIAGNOSIS — H348322 Tributary (branch) retinal vein occlusion, left eye, stable: Secondary | ICD-10-CM | POA: Diagnosis not present

## 2020-01-07 DIAGNOSIS — N183 Chronic kidney disease, stage 3 unspecified: Secondary | ICD-10-CM | POA: Diagnosis not present

## 2020-01-07 DIAGNOSIS — G4700 Insomnia, unspecified: Secondary | ICD-10-CM | POA: Diagnosis not present

## 2020-01-07 DIAGNOSIS — I6521 Occlusion and stenosis of right carotid artery: Secondary | ICD-10-CM | POA: Diagnosis not present

## 2020-01-07 DIAGNOSIS — M1990 Unspecified osteoarthritis, unspecified site: Secondary | ICD-10-CM | POA: Diagnosis not present

## 2020-01-07 DIAGNOSIS — I130 Hypertensive heart and chronic kidney disease with heart failure and stage 1 through stage 4 chronic kidney disease, or unspecified chronic kidney disease: Secondary | ICD-10-CM | POA: Diagnosis not present

## 2020-01-07 DIAGNOSIS — F329 Major depressive disorder, single episode, unspecified: Secondary | ICD-10-CM | POA: Diagnosis not present

## 2020-01-07 DIAGNOSIS — Z955 Presence of coronary angioplasty implant and graft: Secondary | ICD-10-CM | POA: Diagnosis not present

## 2020-01-07 DIAGNOSIS — E1122 Type 2 diabetes mellitus with diabetic chronic kidney disease: Secondary | ICD-10-CM | POA: Diagnosis not present

## 2020-01-07 DIAGNOSIS — F419 Anxiety disorder, unspecified: Secondary | ICD-10-CM | POA: Diagnosis not present

## 2020-01-07 DIAGNOSIS — Z7982 Long term (current) use of aspirin: Secondary | ICD-10-CM | POA: Diagnosis not present

## 2020-01-07 DIAGNOSIS — Z7984 Long term (current) use of oral hypoglycemic drugs: Secondary | ICD-10-CM | POA: Diagnosis not present

## 2020-01-07 DIAGNOSIS — E1151 Type 2 diabetes mellitus with diabetic peripheral angiopathy without gangrene: Secondary | ICD-10-CM | POA: Diagnosis not present

## 2020-01-07 DIAGNOSIS — Z96651 Presence of right artificial knee joint: Secondary | ICD-10-CM | POA: Diagnosis not present

## 2020-01-07 DIAGNOSIS — K4090 Unilateral inguinal hernia, without obstruction or gangrene, not specified as recurrent: Secondary | ICD-10-CM | POA: Diagnosis not present

## 2020-01-07 DIAGNOSIS — R32 Unspecified urinary incontinence: Secondary | ICD-10-CM | POA: Diagnosis not present

## 2020-01-07 DIAGNOSIS — Z7902 Long term (current) use of antithrombotics/antiplatelets: Secondary | ICD-10-CM | POA: Diagnosis not present

## 2020-01-07 DIAGNOSIS — I5032 Chronic diastolic (congestive) heart failure: Secondary | ICD-10-CM | POA: Diagnosis not present

## 2020-01-07 NOTE — Progress Notes (Signed)
   Patient name: Doris Carr MRN: [id number] DOB: 1935/11/02 Sex: female  REASON FOR VISIT: Follow-up left carotid endarterectomy  HPI: Doris Carr is a 84 y.o. female here today for follow-up.  He had a symptomatic left carotid stenosis with visual changes.  She had under went valuation with CT scan showing irregular high-grade left carotid stenosis.  She underwent endarterectomy on 12/13/2019.  She had no postoperative complications and was discharged to home.  She has had no difficulty with minimal discomfort in her incision  Current Outpatient Medications  Medication Sig Dispense Refill  . acetaminophen (TYLENOL) 650 MG CR tablet Take 650 mg by mouth 2 (two) times daily as needed (for arthritis pain.).    Marland Kitchen amLODipine (NORVASC) 5 MG tablet Take 5 mg by mouth 2 (two) times daily.    Marland Kitchen aspirin EC 81 MG EC tablet Take 1 tablet (81 mg total) by mouth daily.    . carvedilol (COREG) 25 MG tablet Take 1 tablet (25 mg total) by mouth 2 (two) times daily. 180 tablet 3  . citalopram (CELEXA) 20 MG tablet Take 20 mg by mouth daily.      . clonazePAM (KLONOPIN) 0.5 MG tablet Take 0.25-0.5 mg by mouth 2 (two) times daily. 0.25 mg in the morning, 0.5 mg in the evening    . clopidogrel (PLAVIX) 75 MG tablet Take 1 tablet (75 mg total) by mouth daily. 90 tablet 1  . Cyanocobalamin (VITAMIN B-12 PO) Take 1 tablet by mouth daily.    . Evolocumab (REPATHA SURECLICK) XX123456 MG/ML SOAJ Inject 140 mg into the skin every 14 (fourteen) days. 6 pen 4  . furosemide (LASIX) 20 MG tablet Take 20 mg by mouth.    . losartan (COZAAR) 50 MG tablet Take 1 tablet (50 mg total) by mouth at bedtime. 90 tablet 3  . metFORMIN (GLUCOPHAGE) 500 MG tablet Take 500 mg by mouth at bedtime.  4  . ondansetron (ZOFRAN) 4 MG tablet Take 1 tablet (4 mg total) by mouth daily as needed for nausea or vomiting. 10 tablet 1  . oxyCODONE-acetaminophen (PERCOCET) 5-325 MG tablet Take 1 tablet by mouth every 4  (four) hours as needed for severe pain. 15 tablet 0  . phenol (CHLORASEPTIC) 1.4 % LIQD Use as directed 1 spray in the mouth or throat as needed for throat irritation / pain.  0  . traZODone (DESYREL) 50 MG tablet Take 50 mg by mouth at bedtime. HALF A PILL AT BEDTIME     No current facility-administered medications for this visit.     PHYSICAL EXAM: Vitals:   01/07/20 1551 01/07/20 1554  BP: 122/75 116/73  Pulse: 83   Resp: 20   Temp: 98.2 F (36.8 C)   SpO2: 97%   Weight: 140 lb (63.5 kg)   Height: 5\' 5"  (1.651 m)     GENERAL: The patient is a well-nourished female, in no acute distress. The vital signs are documented above. Carotid incision is well-healed with no bruits bilaterally.  Neurologically intact  MEDICAL ISSUES: Stable overall.  We will continue full activities.  We will see her again in 9 months with repeat carotid duplex   Rosetta Posner, MD [facility] Tel [phone] Pager [phone]

## 2020-01-09 ENCOUNTER — Other Ambulatory Visit: Payer: Self-pay | Admitting: *Deleted

## 2020-01-09 DIAGNOSIS — I6529 Occlusion and stenosis of unspecified carotid artery: Secondary | ICD-10-CM

## 2020-01-15 DIAGNOSIS — Z7984 Long term (current) use of oral hypoglycemic drugs: Secondary | ICD-10-CM | POA: Diagnosis not present

## 2020-01-15 DIAGNOSIS — Z7902 Long term (current) use of antithrombotics/antiplatelets: Secondary | ICD-10-CM | POA: Diagnosis not present

## 2020-01-15 DIAGNOSIS — R32 Unspecified urinary incontinence: Secondary | ICD-10-CM | POA: Diagnosis not present

## 2020-01-15 DIAGNOSIS — I69398 Other sequelae of cerebral infarction: Secondary | ICD-10-CM | POA: Diagnosis not present

## 2020-01-15 DIAGNOSIS — Z48812 Encounter for surgical aftercare following surgery on the circulatory system: Secondary | ICD-10-CM | POA: Diagnosis not present

## 2020-01-15 DIAGNOSIS — H5462 Unqualified visual loss, left eye, normal vision right eye: Secondary | ICD-10-CM | POA: Diagnosis not present

## 2020-01-15 DIAGNOSIS — G4700 Insomnia, unspecified: Secondary | ICD-10-CM | POA: Diagnosis not present

## 2020-01-15 DIAGNOSIS — E1151 Type 2 diabetes mellitus with diabetic peripheral angiopathy without gangrene: Secondary | ICD-10-CM | POA: Diagnosis not present

## 2020-01-15 DIAGNOSIS — M1990 Unspecified osteoarthritis, unspecified site: Secondary | ICD-10-CM | POA: Diagnosis not present

## 2020-01-15 DIAGNOSIS — M545 Low back pain: Secondary | ICD-10-CM | POA: Diagnosis not present

## 2020-01-15 DIAGNOSIS — Z96651 Presence of right artificial knee joint: Secondary | ICD-10-CM | POA: Diagnosis not present

## 2020-01-15 DIAGNOSIS — K4090 Unilateral inguinal hernia, without obstruction or gangrene, not specified as recurrent: Secondary | ICD-10-CM | POA: Diagnosis not present

## 2020-01-15 DIAGNOSIS — E1122 Type 2 diabetes mellitus with diabetic chronic kidney disease: Secondary | ICD-10-CM | POA: Diagnosis not present

## 2020-01-15 DIAGNOSIS — I5032 Chronic diastolic (congestive) heart failure: Secondary | ICD-10-CM | POA: Diagnosis not present

## 2020-01-15 DIAGNOSIS — I6521 Occlusion and stenosis of right carotid artery: Secondary | ICD-10-CM | POA: Diagnosis not present

## 2020-01-15 DIAGNOSIS — F419 Anxiety disorder, unspecified: Secondary | ICD-10-CM | POA: Diagnosis not present

## 2020-01-15 DIAGNOSIS — K219 Gastro-esophageal reflux disease without esophagitis: Secondary | ICD-10-CM | POA: Diagnosis not present

## 2020-01-15 DIAGNOSIS — I447 Left bundle-branch block, unspecified: Secondary | ICD-10-CM | POA: Diagnosis not present

## 2020-01-15 DIAGNOSIS — H348322 Tributary (branch) retinal vein occlusion, left eye, stable: Secondary | ICD-10-CM | POA: Diagnosis not present

## 2020-01-15 DIAGNOSIS — N183 Chronic kidney disease, stage 3 unspecified: Secondary | ICD-10-CM | POA: Diagnosis not present

## 2020-01-15 DIAGNOSIS — I130 Hypertensive heart and chronic kidney disease with heart failure and stage 1 through stage 4 chronic kidney disease, or unspecified chronic kidney disease: Secondary | ICD-10-CM | POA: Diagnosis not present

## 2020-01-15 DIAGNOSIS — Z7982 Long term (current) use of aspirin: Secondary | ICD-10-CM | POA: Diagnosis not present

## 2020-01-15 DIAGNOSIS — E785 Hyperlipidemia, unspecified: Secondary | ICD-10-CM | POA: Diagnosis not present

## 2020-01-15 DIAGNOSIS — Z955 Presence of coronary angioplasty implant and graft: Secondary | ICD-10-CM | POA: Diagnosis not present

## 2020-01-15 DIAGNOSIS — F329 Major depressive disorder, single episode, unspecified: Secondary | ICD-10-CM | POA: Diagnosis not present

## 2020-01-23 DIAGNOSIS — K219 Gastro-esophageal reflux disease without esophagitis: Secondary | ICD-10-CM | POA: Diagnosis not present

## 2020-01-23 DIAGNOSIS — F419 Anxiety disorder, unspecified: Secondary | ICD-10-CM | POA: Diagnosis not present

## 2020-01-23 DIAGNOSIS — R32 Unspecified urinary incontinence: Secondary | ICD-10-CM | POA: Diagnosis not present

## 2020-01-23 DIAGNOSIS — G4700 Insomnia, unspecified: Secondary | ICD-10-CM | POA: Diagnosis not present

## 2020-01-23 DIAGNOSIS — Z7982 Long term (current) use of aspirin: Secondary | ICD-10-CM | POA: Diagnosis not present

## 2020-01-23 DIAGNOSIS — F329 Major depressive disorder, single episode, unspecified: Secondary | ICD-10-CM | POA: Diagnosis not present

## 2020-01-23 DIAGNOSIS — Z96651 Presence of right artificial knee joint: Secondary | ICD-10-CM | POA: Diagnosis not present

## 2020-01-23 DIAGNOSIS — H348322 Tributary (branch) retinal vein occlusion, left eye, stable: Secondary | ICD-10-CM | POA: Diagnosis not present

## 2020-01-23 DIAGNOSIS — K4090 Unilateral inguinal hernia, without obstruction or gangrene, not specified as recurrent: Secondary | ICD-10-CM | POA: Diagnosis not present

## 2020-01-23 DIAGNOSIS — M1990 Unspecified osteoarthritis, unspecified site: Secondary | ICD-10-CM | POA: Diagnosis not present

## 2020-01-23 DIAGNOSIS — I6521 Occlusion and stenosis of right carotid artery: Secondary | ICD-10-CM | POA: Diagnosis not present

## 2020-01-23 DIAGNOSIS — E785 Hyperlipidemia, unspecified: Secondary | ICD-10-CM | POA: Diagnosis not present

## 2020-01-23 DIAGNOSIS — I5032 Chronic diastolic (congestive) heart failure: Secondary | ICD-10-CM | POA: Diagnosis not present

## 2020-01-23 DIAGNOSIS — H5462 Unqualified visual loss, left eye, normal vision right eye: Secondary | ICD-10-CM | POA: Diagnosis not present

## 2020-01-23 DIAGNOSIS — Z48812 Encounter for surgical aftercare following surgery on the circulatory system: Secondary | ICD-10-CM | POA: Diagnosis not present

## 2020-01-23 DIAGNOSIS — Z7902 Long term (current) use of antithrombotics/antiplatelets: Secondary | ICD-10-CM | POA: Diagnosis not present

## 2020-01-23 DIAGNOSIS — M545 Low back pain: Secondary | ICD-10-CM | POA: Diagnosis not present

## 2020-01-23 DIAGNOSIS — I130 Hypertensive heart and chronic kidney disease with heart failure and stage 1 through stage 4 chronic kidney disease, or unspecified chronic kidney disease: Secondary | ICD-10-CM | POA: Diagnosis not present

## 2020-01-23 DIAGNOSIS — Z955 Presence of coronary angioplasty implant and graft: Secondary | ICD-10-CM | POA: Diagnosis not present

## 2020-01-23 DIAGNOSIS — Z7984 Long term (current) use of oral hypoglycemic drugs: Secondary | ICD-10-CM | POA: Diagnosis not present

## 2020-01-23 DIAGNOSIS — E1122 Type 2 diabetes mellitus with diabetic chronic kidney disease: Secondary | ICD-10-CM | POA: Diagnosis not present

## 2020-01-23 DIAGNOSIS — I447 Left bundle-branch block, unspecified: Secondary | ICD-10-CM | POA: Diagnosis not present

## 2020-01-23 DIAGNOSIS — I69398 Other sequelae of cerebral infarction: Secondary | ICD-10-CM | POA: Diagnosis not present

## 2020-01-23 DIAGNOSIS — E1151 Type 2 diabetes mellitus with diabetic peripheral angiopathy without gangrene: Secondary | ICD-10-CM | POA: Diagnosis not present

## 2020-01-23 DIAGNOSIS — N183 Chronic kidney disease, stage 3 unspecified: Secondary | ICD-10-CM | POA: Diagnosis not present

## 2020-01-27 ENCOUNTER — Encounter: Payer: Self-pay | Admitting: Neurology

## 2020-01-27 ENCOUNTER — Other Ambulatory Visit: Payer: Self-pay

## 2020-01-27 ENCOUNTER — Ambulatory Visit: Payer: PPO | Admitting: Neurology

## 2020-01-27 VITALS — BP 122/80 | HR 77 | Temp 97.3°F | Ht 63.0 in | Wt 149.0 lb

## 2020-01-27 DIAGNOSIS — F0391 Unspecified dementia with behavioral disturbance: Secondary | ICD-10-CM

## 2020-01-27 DIAGNOSIS — I639 Cerebral infarction, unspecified: Secondary | ICD-10-CM

## 2020-01-27 DIAGNOSIS — R269 Unspecified abnormalities of gait and mobility: Secondary | ICD-10-CM | POA: Diagnosis not present

## 2020-01-27 DIAGNOSIS — F03918 Unspecified dementia, unspecified severity, with other behavioral disturbance: Secondary | ICD-10-CM | POA: Insufficient documentation

## 2020-01-27 MED ORDER — CLOPIDOGREL BISULFATE 75 MG PO TABS: 75.0000 mg | ORAL_TABLET | Freq: Every day | ORAL | 4 refills | Status: DC

## 2020-01-27 NOTE — Progress Notes (Signed)
PATIENT: Doris Carr DOB: Jan 23, 1936  Chief Complaint  Patient presents with  . Follow-up    New room with husband. ED visit back in March for CVA/visual disturb. Vision is back to baseline. pt reports she is feeling better.      HISTORICAL  Doris Carr is a 84 year old female, seen in request by her primary care physician Dr. Harrington Challenger, Dwyane Luo for evaluation of visual disturbance, she is accompanied by her husband at today's clinical visit on January 27, 2020.  I have reviewed and summarized the referring note from the referring physician.  She has past medical history of hypertension, diabetes, depression anxiety   I reviewed hospital discharge summary from March 9-13, 2021, she was admitted to the hospital for sudden onset left visual disturbance, it happened on December 10 2019 while she was watching a basketball game, the following morning, she noticed visual field deficit, she was not able to see the top of the page when trying to read, she was seen by optometrist, concerning for retinal artery occlusion, was directed to emergency room  I personally reviewed MRI of the brain on December 11, 2019, acute lacunar infarction at left caudate head, generalized atrophy, extensive supratentorium small vessel disease  MRA of the brain and neck showed high-grade vertebrobasilar stenosis,  CT angiogram of head and neck plaque at ICA origins, less than 50% stenosis on the right side, 50% stenosis on the left, posterior circulation intracranial atherosclerosis, poorly visualized distal intracranial right vertebral artery, probable plaque at the right PICA origin, moderate stenosis of left vertebral artery and basilar artery, short segment marked stenosis at left superior cerebellar artery,  Laboratory evaluations showed elevated glucose 217, GFR of 47,  Hemoglobin of 12.1, LDL of 72, A1c 7.6, urinalysis showed trace leukocyte, normal ESR, C-reactive protein,  She has  Left Carotid Endarterectomy  with Dacron Patch Angioplasty by Dr. Donnetta Hutching on December 13 2019  She was discharged with aspirin 81 mg plus Plavix 75 mg daily, double antiplatelet treatment for 3 weeks, Plavix alone.   I have reviewed and summarized the referring note from the referring physician.  She had past medical history of hypertension, diabetes, peripheral vascular disease, she presented with bilateral lower exetreities swelling, pain with ambulation, she has stent in right common iliac artery, and left SFA, it did help her low back pain, she also had a history of right knee replacement in February 2018, previously she had significant gait abnormality, contributing to her peripheral vascular disease, right knee pain,  However, despite aggressive rehabilitation, no longer have significant right knee pain, she had progressive worsening gait abnormality, she complains of low back pain, but no radiating pain to bilateral lower extremity, denies significant neck pain, she has diabetic peripheral neuropathy, most recent A1c was 7.8,  She denies bowel and bladder incontinence, she was also noted to have gradual onset memory loss  REVIEW OF SYSTEMS: Full 14 system review of systems performed and notable only for as above All other review of systems were negative.  ALLERGIES: Allergies  Allergen Reactions  . Hydralazine Other (See Comments)    Unsure of reaction  . Ciprofloxacin Nausea And Vomiting  . Codeine Nausea And Vomiting  . Darvocet [Propoxyphene N-Acetaminophen] Nausea Only  . Darvon Nausea Only  . Hydrocodone Nausea And Vomiting  . Morphine And Related Nausea And Vomiting  . Pentazocine Lactate Nausea And Vomiting  . Potassium-Containing Compounds Nausea Only  . Procaine Hcl Nausea And Vomiting  . Sulfa Drugs Cross  Reactors Rash    HOME MEDICATIONS: Current Outpatient Medications  Medication Sig Dispense Refill  . acetaminophen (TYLENOL) 650 MG CR tablet Take 650 mg by mouth 2 (two) times daily as needed  (for arthritis pain.).    Marland Kitchen amLODipine (NORVASC) 5 MG tablet Take 5 mg by mouth 2 (two) times daily.    Marland Kitchen aspirin EC 81 MG EC tablet Take 1 tablet (81 mg total) by mouth daily.    . carvedilol (COREG) 25 MG tablet Take 1 tablet (25 mg total) by mouth 2 (two) times daily. 180 tablet 3  . citalopram (CELEXA) 20 MG tablet Take 20 mg by mouth daily.      . clonazePAM (KLONOPIN) 0.5 MG tablet Take 0.25-0.5 mg by mouth 2 (two) times daily. 0.25 mg in the morning, 0.5 mg in the evening    . clopidogrel (PLAVIX) 75 MG tablet Take 1 tablet (75 mg total) by mouth daily. 90 tablet 1  . Cyanocobalamin (VITAMIN B-12 PO) Take 1 tablet by mouth daily.    . Evolocumab (REPATHA SURECLICK) 409 MG/ML SOAJ Inject 140 mg into the skin every 14 (fourteen) days. 6 pen 4  . furosemide (LASIX) 20 MG tablet Take 20 mg by mouth.    . losartan (COZAAR) 50 MG tablet Take 1 tablet (50 mg total) by mouth at bedtime. 90 tablet 3  . metFORMIN (GLUCOPHAGE) 500 MG tablet Take 500 mg by mouth at bedtime.  4  . ondansetron (ZOFRAN) 4 MG tablet Take 1 tablet (4 mg total) by mouth daily as needed for nausea or vomiting. 10 tablet 1  . oxyCODONE-acetaminophen (PERCOCET) 5-325 MG tablet Take 1 tablet by mouth every 4 (four) hours as needed for severe pain. 15 tablet 0  . phenol (CHLORASEPTIC) 1.4 % LIQD Use as directed 1 spray in the mouth or throat as needed for throat irritation / pain.  0  . traZODone (DESYREL) 50 MG tablet Take 50 mg by mouth at bedtime. HALF A PILL AT BEDTIME     No current facility-administered medications for this visit.    PAST MEDICAL HISTORY: Past Medical History:  Diagnosis Date  . Anxiety   . Arthritis    osteoarthritis  . Back pain    sees a neurosurgeon  . Balance problems   . Balance problems   . Chronic diastolic heart failure (HCC)    Echo (8/12) with EF 60-65%, moderate LV hypertrophy, mild LVH  . Chronic renal insufficiency, stage III (moderate)   . 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 CKD (chronic kidney disease)    likely due to DM2 and HTN  . Current tear of meniscus right knee  . Depression   . Diabetes mellitus    type  2  . Diastolic CHF, chronic ([place]) 06/12/2011  . Fecal incontinence 05/14/2013  . GERD (gastroesophageal reflux disease)   . History of hiatal hernia    inguinal hernia  . History of total knee arthroplasty, right 11/16/2016  . HLD (hyperlipidemia)    myalgias with statins  . Hyperlipidemia 09/14/2011  . Hypertension    This has been difficult to control. She has had renal artery doppler evaluation on two different occasions, both times with no evidence for renal artery stenosis. She has had urinary catecholeamine collection that was unremarkable. Edema with 10 mg amlodipine.   . Left bundle branch block   . Leg pain 05/14/2011  . Lower extremity edema    CHRONIC  .  Lung nodule    VATS and biopsy in 2006 that was negative for malignancy  . Obesity   . Peripheral vascular disease ([place])    Lower extremity evaluation (12/12): ABI 0.79 right, 0.64 left; > 50% mid left SFA stenosis;  s/p L SFA stent and stent to R CIA 11/2011; ABI's post PTA - R 0.90, L 0.96 (TBI on R 0.51, L 0.61)  . Pneumonia    multiple times in childhood  . PONV (postoperative nausea and vomiting)   . Pre-operative cardiovascular examination 02/03/2016  . S/P cardiac catheterization    Left heart cath in 6/06 with normal coronaries, EF 60%.  . Syncope and collapse     PAST SURGICAL HISTORY: Past Surgical History:  Procedure Laterality Date  . ABDOMINAL AORTAGRAM N/A 11/09/2011   Procedure: ABDOMINAL Maxcine Ham;  Surgeon: Sherren Mocha, MD;  Location: [facility] CATH LAB;  Service: Cardiovascular;  Laterality: N/A;  . abdominal aortogram  11/09/2011  . ANAL RECTAL MANOMETRY N/A 05/08/2013   Procedure: ANAL RECTAL MANOMETRY;  Surgeon: Leighton Ruff, MD;  Location: [facility]OS[facility];  Service: Endoscopy;  Laterality: N/A;  . CARDIAC CATHETERIZATION  2006   NORMAL  CORONARIES  . CATARACT EXTRACTION    . CERVICAL CONE BIOPSY    . ENDARTERECTOMY Left 12/13/2019   Procedure: ENDARTERECTOMY CAROTID;  Surgeon: Rosetta Posner, MD;  Location: [facility];  Service: Vascular;  Laterality: Left;  . FLEXIBLE SIGMOIDOSCOPY N/A 05/08/2013   Procedure: FLEXIBLE SIGMOIDOSCOPY;  Surgeon: Leighton Ruff, MD;  Location: [facility];  Service: Endoscopy;  Laterality: N/A;  rigid proctoscope get from o.r.  . GYNECOLOGIC CRYOSURGERY    . Knee Surg     Arthroscopic  . PATCH ANGIOPLASTY Left 12/13/2019   Procedure: Patch Angioplasty;  Surgeon: Rosetta Posner, MD;  Location: [facility];  Service: Vascular;  Laterality: Left;  . RECTAL ULTRASOUND N/A 05/08/2013   Procedure: RECTAL ULTRASOUND;  Surgeon: Leighton Ruff, MD;  Location: [facility];  Service: Endoscopy;  Laterality: N/A;  . Stints in leg arteries    . THORACOTOMY    . THYMIC CYST REMOVAL    . TONSILLECTOMY    . TOTAL KNEE ARTHROPLASTY Right 11/16/2016   Procedure: RIGHT TOTAL KNEE ARTHROPLASTY;  Surgeon: Latanya Maudlin, MD;  Location: [facility];  Service: Orthopedics;  Laterality: Right;  block  . VAGINAL HYSTERECTOMY  1985   WITH A/P REPAIR    FAMILY HISTORY: Family History  Problem Relation Age of Onset  . Alzheimer's disease Mother   . Hypertension Mother   . Heart disease Mother   . Alzheimer's disease Father   . Diabetes Paternal Grandfather     SOCIAL HISTORY: Social History   Socioeconomic History  . Marital status: Married    Spouse name: Not on file  . Number of children: 3  . Years of education: college  . Highest education level: Master's degree (e.g., MA, MS, MEng, MEd, MSW, MBA)  Occupational History  . Occupation: Retired  Tobacco Use  . Smoking status: Former Smoker    Packs/day: 0.80    Years: 15.00    Pack years: 12.00    Types: Cigarettes    Quit date: 11/03/1984    Years since quitting: 35.2  . Smokeless tobacco: Never Used  Substance and Sexual Activity  . Alcohol use: No     Alcohol/week: 0.0 standard drinks  . Drug use: No  . Sexual activity: Yes    Birth control/protection: Surgical, Post-menopausal    Comment: 1st intercourse 38 yo-1 partner  Other Topics Concern  . Not on file  Social History Narrative   Lives at home with her husband.   Caffeine use: 1 cup per day.   Right-handed.   Social Determinants of Health   Financial Resource Strain:   . Difficulty of Paying Living Expenses:   Food Insecurity:   . Worried About Charity fundraiser in the Last Year:   . Arboriculturist in the Last Year:   Transportation Needs:   . Film/video editor (Medical):   Marland Kitchen Lack of Transportation (Non-Medical):   Physical Activity:   . Days of Exercise per Week:   . Minutes of Exercise per Session:   Stress:   . Feeling of Stress :   Social Connections:   . Frequency of Communication with Friends and Family:   . Frequency of Social Gatherings with Friends and Family:   . Attends Religious Services:   . Active Member of Clubs or Organizations:   . Attends Archivist Meetings:   Marland Kitchen Marital Status:   Intimate Partner Violence:   . Fear of Current or Ex-Partner:   . Emotionally Abused:   Marland Kitchen Physically Abused:   . Sexually Abused:      PHYSICAL EXAM   Vitals:   01/27/20 1337  BP: 122/80  Pulse: 77  Temp: (!) 97.3 F (36.3 C)  TempSrc: Temporal  Weight: 149 lb (67.6 kg)  Height: 5' 3"  (1.6 m)    Not recorded      Body mass index is 26.39 kg/m.  PHYSICAL EXAMNIATION:  Gen: NAD, conversant, well nourised, well groomed                     Cardiovascular: Regular rate rhythm, no peripheral edema, warm, nontender. Eyes: Conjunctivae clear without exudates or hemorrhage Neck: Supple, no carotid bruits. Pulmonary: Clear to auscultation bilaterally   NEUROLOGICAL EXAM:  MENTAL STATUS: Montreal Cognitive Assessment  01/27/2020  Visuospatial/ Executive (0/5) 0  Naming (0/3) 3  Attention: Read list of digits (0/2) 2  Attention: Read  list of letters (0/1) 1  Attention: Serial 7 subtraction starting at 100 (0/3) 2  Language: Repeat phrase (0/2) 2  Language : Fluency (0/1) 0  Abstraction (0/2) 2  Delayed Recall (0/5) 0  Orientation (0/6) 3  Total 15     CRANIAL NERVES: CN II: Visual fields are full to confrontation. Pupils are round equal and briskly reactive to light. CN III, IV, VI: extraocular movement are normal. No ptosis. CN V: Facial sensation is intact to light touch CN VII: Face is symmetric with normal eye closure  CN VIII: Hearing is normal to causal conversation. CN IX, X: Phonation is normal. CN XI: Head turning and shoulder shrug are intact  MOTOR: There is no pronator drift of out-stretched arms. Muscle bulk and tone are normal. Muscle strength is normal.  REFLEXES: Reflexes are 1 and symmetric at the biceps, triceps, knees, and ankles. Plantar responses are flexor.  SENSORY: Decreased light touch, vibratory sensation at right leg.  COORDINATION: There is no trunk or limb dysmetria noted.  GAIT/STANCE: She needs assistant to get up from seated position, cautious, unsteady, right toes pointed outwards  DIAGNOSTIC DATA (LABS, IMAGING, TESTING) - I reviewed patient records, labs, notes, testing and imaging myself where available.   ASSESSMENT AND PLAN  JAYANI ROZMAN is a 84 y.o. female   Stroke  Presented with sudden onset left upper visual field loss,  MRI of the brain on  December 11, 2019 showed acute lacunar infarction in the left caudate head, generalized atrophy, extensive supratentorium small vessel disease  MRI of the brain showed significant intracranial atherosclerotic disease,  CT angiogram showed 60% stenosis involving left carotid artery bifurcation, and excavated plaque,  She underwent left carotid endarterectomy with Dacron patch angioplasty by Dr. Donnetta Hutching on December 13, 2019,  Was discharged home with aspirin 81+ Plavix 75 mg daily, also physical therapy,  Will change to Plavix 75  mg single antiplatelet agent treatment now,  Worsening memory loss, difficulty sleeping at nighttime,  This can be related to her dementia, likely a combination of central nervous system degenerative disorder, with vascular component,  She is already on trazodone 50 mg at bedtime, clonazepam 0.5 mg twice a day, Celexa 20 mg daily,  She complains of worsening nighttime agitation, recurrent hot flash, difficulty sleeping after stopping her extrace days 0.5 mg supplement, which can cause increased thrombotic event,  From neurology, stroke standpoint, she better not to continue hormone supplement, patient insistent on worsening symptoms after stopping estrogen supplement, she will continue to discuss with her primary care   Marcial Pacas, M.D. Ph.D.  [facility] [address], [place], [place] [address] Ph: [phone] Fax: [phone]  CC: Lawerance Cruel, MD

## 2020-01-28 DIAGNOSIS — E1142 Type 2 diabetes mellitus with diabetic polyneuropathy: Secondary | ICD-10-CM | POA: Diagnosis not present

## 2020-01-28 DIAGNOSIS — M1990 Unspecified osteoarthritis, unspecified site: Secondary | ICD-10-CM | POA: Diagnosis not present

## 2020-01-28 DIAGNOSIS — M19041 Primary osteoarthritis, right hand: Secondary | ICD-10-CM | POA: Diagnosis not present

## 2020-01-28 DIAGNOSIS — Z7984 Long term (current) use of oral hypoglycemic drugs: Secondary | ICD-10-CM | POA: Diagnosis not present

## 2020-01-28 DIAGNOSIS — N183 Chronic kidney disease, stage 3 unspecified: Secondary | ICD-10-CM | POA: Diagnosis not present

## 2020-01-28 DIAGNOSIS — G4700 Insomnia, unspecified: Secondary | ICD-10-CM | POA: Diagnosis not present

## 2020-01-28 DIAGNOSIS — E7800 Pure hypercholesterolemia, unspecified: Secondary | ICD-10-CM | POA: Diagnosis not present

## 2020-01-28 DIAGNOSIS — I10 Essential (primary) hypertension: Secondary | ICD-10-CM | POA: Diagnosis not present

## 2020-01-29 ENCOUNTER — Encounter: Payer: Self-pay | Admitting: Neurology

## 2020-02-07 ENCOUNTER — Ambulatory Visit: Payer: PPO | Admitting: Cardiovascular Disease

## 2020-02-16 DIAGNOSIS — E7800 Pure hypercholesterolemia, unspecified: Secondary | ICD-10-CM | POA: Diagnosis not present

## 2020-02-16 DIAGNOSIS — E1142 Type 2 diabetes mellitus with diabetic polyneuropathy: Secondary | ICD-10-CM | POA: Diagnosis not present

## 2020-02-16 DIAGNOSIS — G4700 Insomnia, unspecified: Secondary | ICD-10-CM | POA: Diagnosis not present

## 2020-02-16 DIAGNOSIS — N183 Chronic kidney disease, stage 3 unspecified: Secondary | ICD-10-CM | POA: Diagnosis not present

## 2020-02-16 DIAGNOSIS — M19041 Primary osteoarthritis, right hand: Secondary | ICD-10-CM | POA: Diagnosis not present

## 2020-02-16 DIAGNOSIS — I10 Essential (primary) hypertension: Secondary | ICD-10-CM | POA: Diagnosis not present

## 2020-02-16 DIAGNOSIS — M1990 Unspecified osteoarthritis, unspecified site: Secondary | ICD-10-CM | POA: Diagnosis not present

## 2020-02-18 ENCOUNTER — Ambulatory Visit: Payer: PPO | Admitting: Cardiovascular Disease

## 2020-03-06 DIAGNOSIS — R609 Edema, unspecified: Secondary | ICD-10-CM | POA: Diagnosis not present

## 2020-03-10 DIAGNOSIS — H34232 Retinal artery branch occlusion, left eye: Secondary | ICD-10-CM | POA: Diagnosis not present

## 2020-03-10 DIAGNOSIS — Z961 Presence of intraocular lens: Secondary | ICD-10-CM | POA: Diagnosis not present

## 2020-03-11 ENCOUNTER — Other Ambulatory Visit (HOSPITAL_COMMUNITY): Payer: Self-pay | Admitting: Cardiovascular Disease

## 2020-03-11 DIAGNOSIS — I739 Peripheral vascular disease, unspecified: Secondary | ICD-10-CM

## 2020-03-17 ENCOUNTER — Telehealth: Payer: Self-pay | Admitting: Cardiovascular Disease

## 2020-03-17 NOTE — Telephone Encounter (Signed)
Called and spoke with pts husband, notified that since his wife was calling in asking for him to attend her appt that it was fine for him to come. Husband verbalized understanding and was thankful for the call.

## 2020-03-17 NOTE — Telephone Encounter (Signed)
Patient states that she will need her husband to come with her to her appointments on 03/20/20. She states she needs help walking with her walker.

## 2020-03-18 ENCOUNTER — Encounter (HOSPITAL_COMMUNITY): Payer: PPO

## 2020-03-18 DIAGNOSIS — E7800 Pure hypercholesterolemia, unspecified: Secondary | ICD-10-CM | POA: Diagnosis not present

## 2020-03-18 DIAGNOSIS — M19041 Primary osteoarthritis, right hand: Secondary | ICD-10-CM | POA: Diagnosis not present

## 2020-03-18 DIAGNOSIS — M1990 Unspecified osteoarthritis, unspecified site: Secondary | ICD-10-CM | POA: Diagnosis not present

## 2020-03-18 DIAGNOSIS — I10 Essential (primary) hypertension: Secondary | ICD-10-CM | POA: Diagnosis not present

## 2020-03-18 DIAGNOSIS — N183 Chronic kidney disease, stage 3 unspecified: Secondary | ICD-10-CM | POA: Diagnosis not present

## 2020-03-18 DIAGNOSIS — G4700 Insomnia, unspecified: Secondary | ICD-10-CM | POA: Diagnosis not present

## 2020-03-18 DIAGNOSIS — E1142 Type 2 diabetes mellitus with diabetic polyneuropathy: Secondary | ICD-10-CM | POA: Diagnosis not present

## 2020-03-20 ENCOUNTER — Other Ambulatory Visit (HOSPITAL_COMMUNITY): Payer: Self-pay | Admitting: Cardiovascular Disease

## 2020-03-20 ENCOUNTER — Other Ambulatory Visit (HOSPITAL_COMMUNITY): Payer: Self-pay | Admitting: Vascular Surgery

## 2020-03-20 ENCOUNTER — Ambulatory Visit (HOSPITAL_BASED_OUTPATIENT_CLINIC_OR_DEPARTMENT_OTHER)
Admission: RE | Admit: 2020-03-20 | Discharge: 2020-03-20 | Disposition: A | Discharge status: Home / Self Care | Payer: PPO | Source: Ambulatory Visit | Attending: Vascular Surgery | Admitting: Vascular Surgery

## 2020-03-20 ENCOUNTER — Other Ambulatory Visit: Payer: Self-pay

## 2020-03-20 ENCOUNTER — Ambulatory Visit (HOSPITAL_COMMUNITY)
Admission: RE | Admit: 2020-03-20 | Discharge: 2020-03-20 | Disposition: A | Discharge status: Home / Self Care | Payer: PPO | Source: Ambulatory Visit | Attending: Cardiovascular Disease | Admitting: Cardiovascular Disease

## 2020-03-20 DIAGNOSIS — Z48812 Encounter for surgical aftercare following surgery on the circulatory system: Secondary | ICD-10-CM | POA: Diagnosis not present

## 2020-03-20 DIAGNOSIS — I739 Peripheral vascular disease, unspecified: Secondary | ICD-10-CM

## 2020-03-20 DIAGNOSIS — Z95820 Peripheral vascular angioplasty status with implants and grafts: Secondary | ICD-10-CM

## 2020-03-20 DIAGNOSIS — Z95828 Presence of other vascular implants and grafts: Secondary | ICD-10-CM

## 2020-03-20 DIAGNOSIS — I6529 Occlusion and stenosis of unspecified carotid artery: Secondary | ICD-10-CM

## 2020-03-20 DIAGNOSIS — Z9889 Other specified postprocedural states: Secondary | ICD-10-CM

## 2020-03-20 DIAGNOSIS — I6523 Occlusion and stenosis of bilateral carotid arteries: Secondary | ICD-10-CM

## 2020-03-24 ENCOUNTER — Telehealth: Payer: Self-pay

## 2020-03-24 DIAGNOSIS — I739 Peripheral vascular disease, unspecified: Secondary | ICD-10-CM

## 2020-03-24 NOTE — Telephone Encounter (Signed)
Called patient aorta doppler results left on personal voice mail.Advised to repeat in 12 months.

## 2020-03-24 NOTE — Telephone Encounter (Signed)
Called patient left lower ext doppler results on personal voice mail.

## 2020-04-23 IMAGING — CT CT ANGIO HEAD
1 of 8 series · 14 of 47 positions shown · IV contrast (omnipaque)
Comparison: MRA earlier same day

CLINICAL DATA: Abnormal MRA

EXAM:
CT ANGIOGRAPHY HEAD AND NECK
TECHNIQUE: Multidetector CT imaging of the head and neck was performed using
the standard protocol during bolus administration of intravenous
contrast. Multiplanar CT image reconstructions and MIPs were
obtained to evaluate the vascular anatomy. Carotid stenosis
measurements (when applicable) are obtained utilizing NASCET
criteria, using the distal internal carotid diameter as the
denominator.
CONTRAST:  75 mL OMNIPAQUE IOHEXOL 350 MG/ML SOLN

[Series 4: thin · axial · 0.43mm/px · z∈[-288,+30]mm · 14 of 735 slices shown]
[im 49/735  brain]
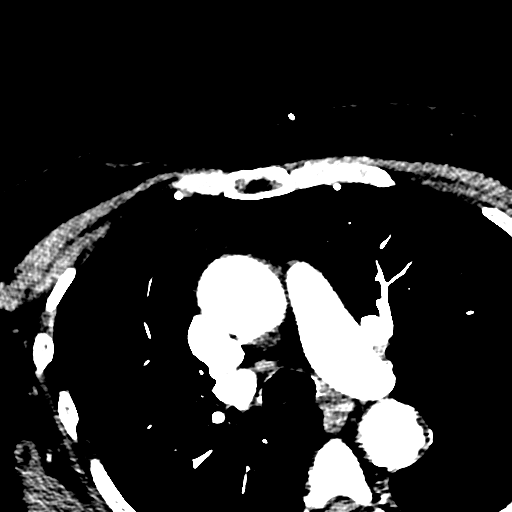
[im 98/735  bone]
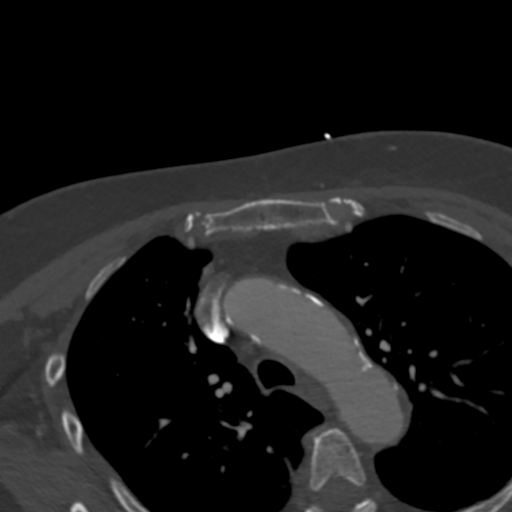
[im 147/735  brain]
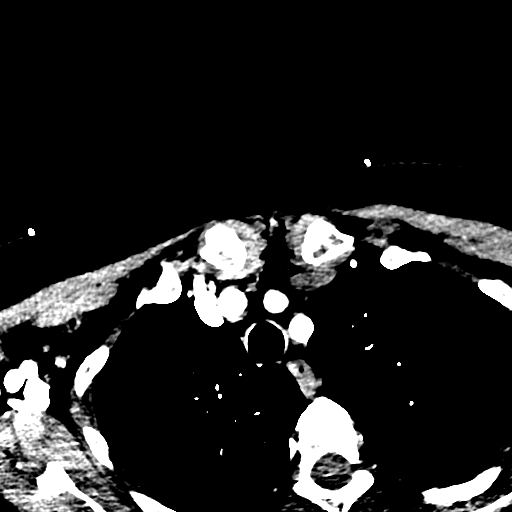
[im 196/735  bone]
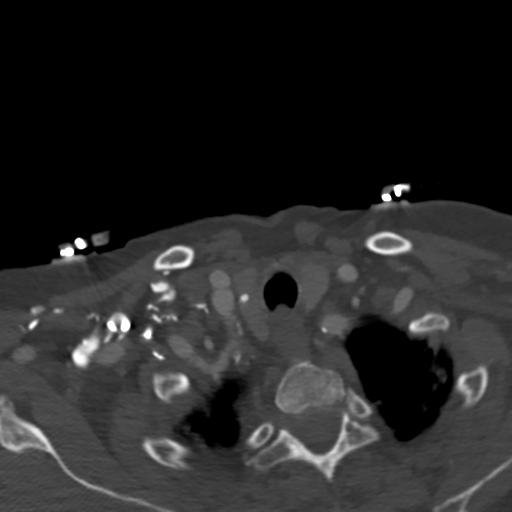
[im 245/735  brain]
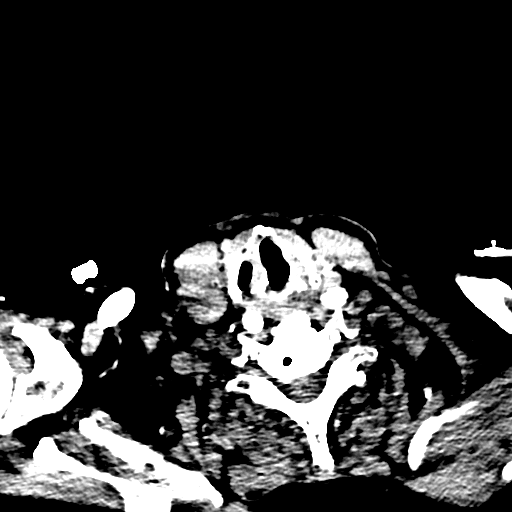
[im 294/735  bone]
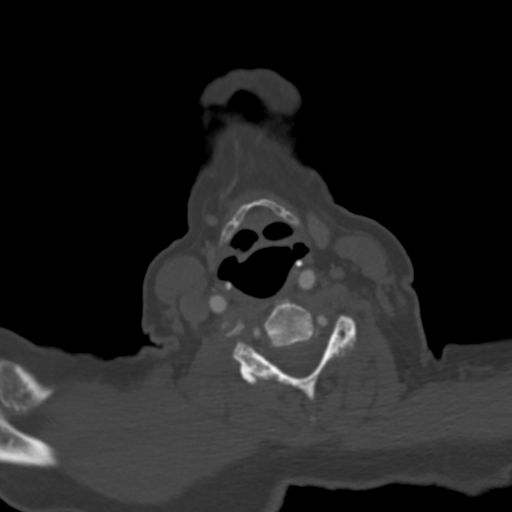
[im 343/735  brain]
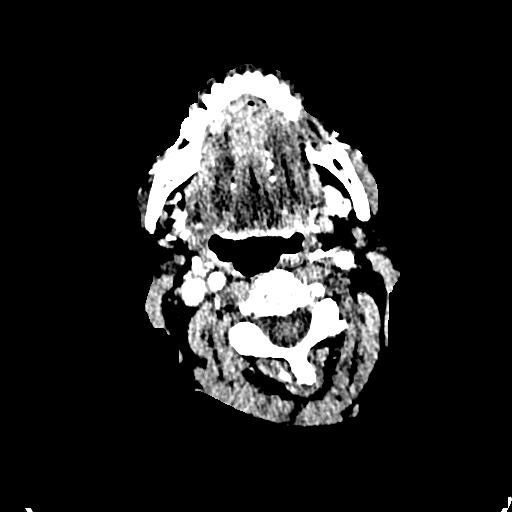
[im 392/735  bone]
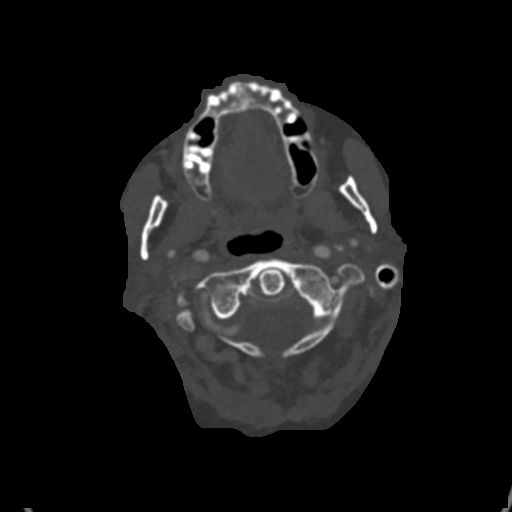
[im 441/735  brain]
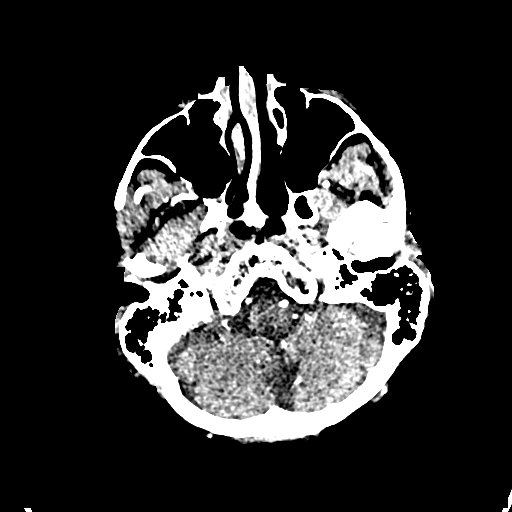
[im 490/735  bone]
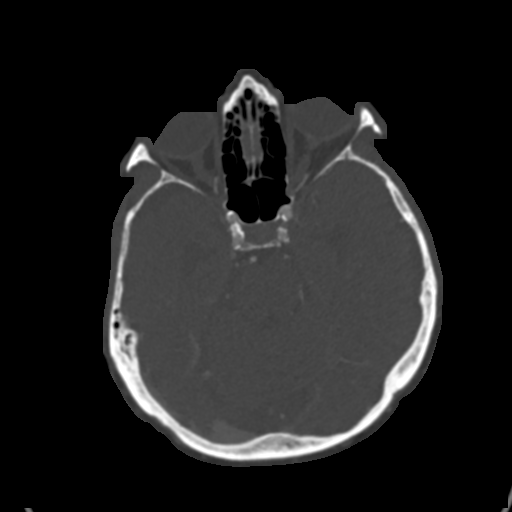
[im 539/735  brain]
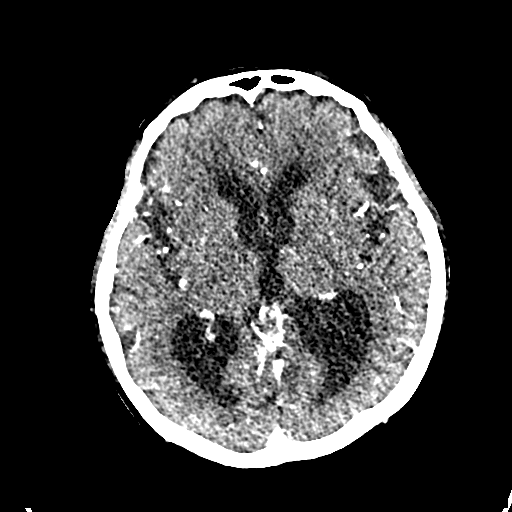
[im 588/735  bone]
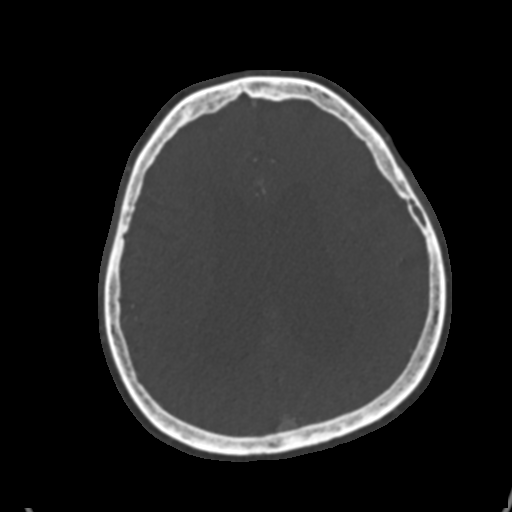
[im 637/735  brain]
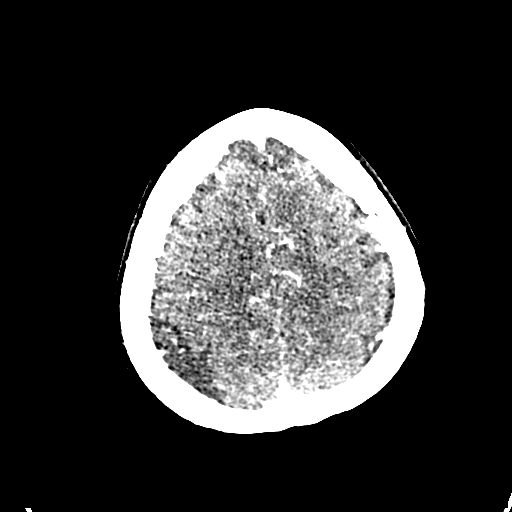
[im 686/735  bone]
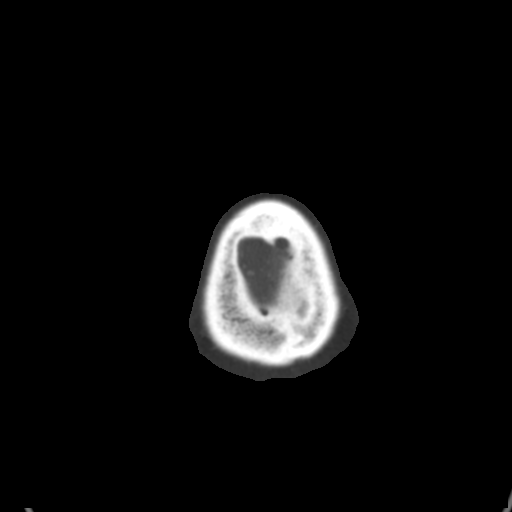

[14 of 47 positions shown; findings below may reference images not displayed]

FINDINGS: CTA NECK

Aortic arch: Calcified and noncalcified plaque the aortic arch.
Great vessel origins are patent.

Right carotid system: Patent. There is calcified plaque at the ICA
origin causing less than 50% stenosis.

Left carotid system: Patent. There is calcified plaque at the ICA
origin causing approximately 50% stenosis.

Vertebral arteries: Patent. Left vertebral artery is slightly
dominant. No significant stenosis.

Skeleton: Multilevel degenerative changes, greatest at C5-C6 at
C6-C7.

Other neck: No mass or adenopathy.

Upper chest: No apical lung mass.

Review of the MIP images confirms the above findings

CTA HEAD

Anterior circulation: Intracranial internal carotid arteries are
patent with calcified plaque causing mild stenosis. Middle and
anterior cerebral arteries are patent. Left A1 ACA is dominant.
Patent anterior communicating artery.

Posterior circulation: Proximal intracranial right vertebral artery
is patent. This either terminates as the PICA or becomes diminutive
after PICA origin. There is probable plaque at the right PICA
origin. Intracranial left vertebral artery is patent with plaque
causing moderate stenosis. Left PICA is patent. Basilar artery is
patent atherosclerotic irregularity particularly proximally where
there is moderate stenosis. There is short segment marked stenosis
at the origin of the left superior cerebellar artery. Posterior
cerebral arteries are patent. Mild stenosis of the proximal left P2
PCA. Bilateral posterior communicating arteries are present.

Venous sinuses: As permitted by contrast timing, patent.

Present on the prior MRI, there is a partially calcified 9 mm
meningioma along the left parietal convexity.

Review of the MIP images confirms the above findings
IMPRESSION: Plaque at the ICA origins causing less than 50% stenosis on the
right and approximately 50% stenosis on the left.

Posterior circulation intracranial atherosclerosis. Poorly
visualized distal intracranial right vertebral artery, which may
reflect plaque. Probable plaque at the right PICA origin. Moderate
stenosis of the left vertebral and basilar arteries. Short segment
marked stenosis at the left superior cerebellar artery origin. Mild
stenosis of the proximal left P2 PCA.

## 2020-04-23 IMAGING — MR MR HEAD W/O CM
10 of 11 series · 42 of 48 positions shown · non-contrast
Comparison: Head CT from yesterday and brain MRI from 12/04/2018

CLINICAL DATA: Visual disturbance. Flashing in the left eye.
Retinal artery occlusion by fundoscopy, per report

EXAM:
MRI HEAD WITHOUT CONTRAST
MRA HEAD WITHOUT CONTRAST
TECHNIQUE: Multiplanar, multiecho pulse sequences of the brain and surrounding
structures were obtained without intravenous contrast. Angiographic
images of the head were obtained using MRA technique without
contrast.

[Series 5: DWI · axial · 3.0mm · 0.88mm/px · z∈[-57,+80]mm · 9 of 94 slices shown (1 of 4)]
[im 1/94]
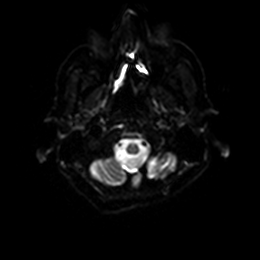
[im 12/94]
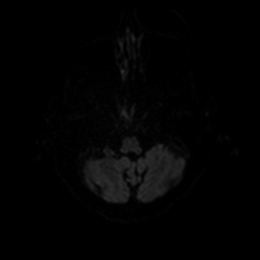
[im 24/94]
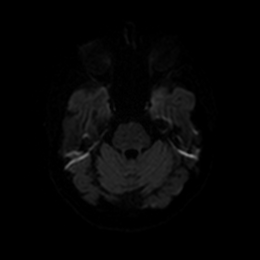
[im 35/94]
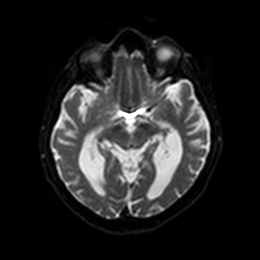
[im 47/94]
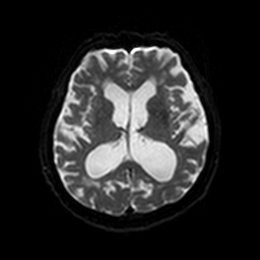
[im 59/94]
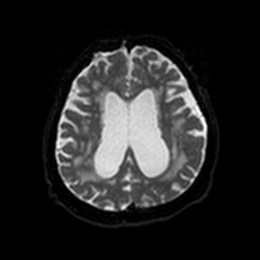
[im 70/94]
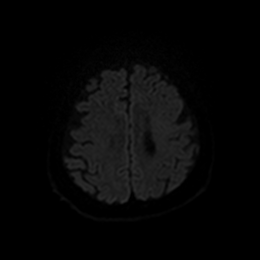
[im 82/94]
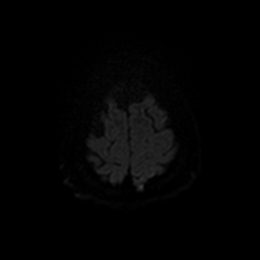
[im 94/94]
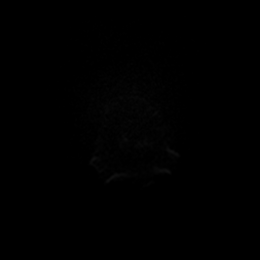

[Series 6: DWI · axial · 3.0mm · 0.88mm/px · z∈[-57,+80]mm · 4 of 47 slices shown (2 of 4)]
[im 1/47]
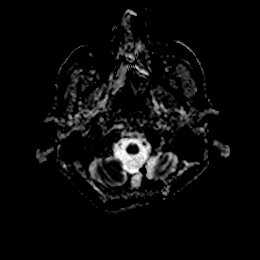
[im 16/47]
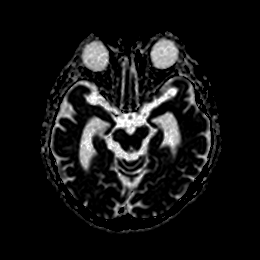
[im 31/47]
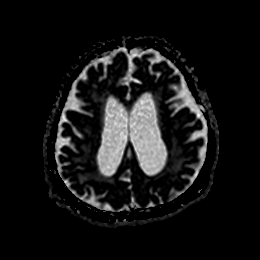
[im 47/47]
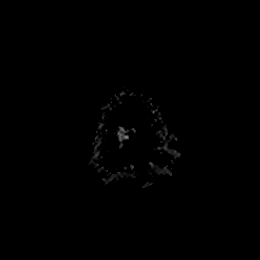

[Series 7: DWI · coronal · 4.0mm · 0.88mm/px · 6 of 66 slices shown (3 of 4)]
[im 1/66]
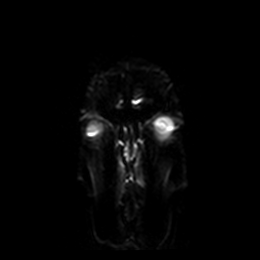
[im 14/66]
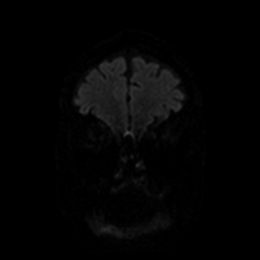
[im 27/66]
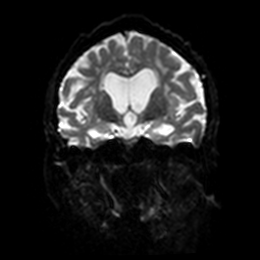
[im 40/66]
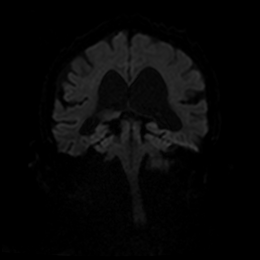
[im 53/66]
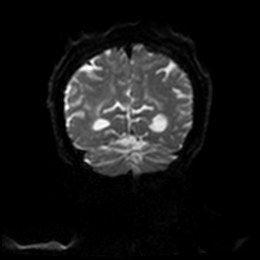
[im 66/66]
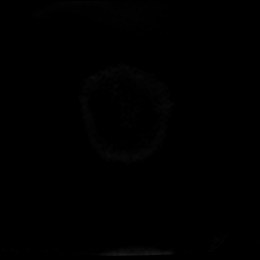

[Series 8: DWI · coronal · 4.0mm · 0.88mm/px · 3 of 33 slices shown (4 of 4)]
[im 1/33]
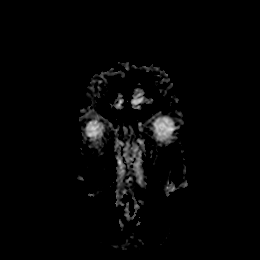
[im 17/33]
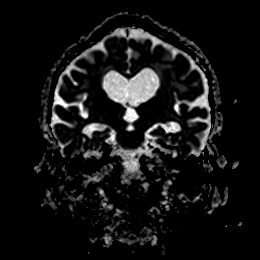
[im 33/33]
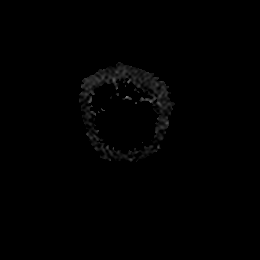

[Series 9: T1 · sagittal · 5.0mm · 0.75mm/px · 2 of 25 slices shown]
[im 1/25]
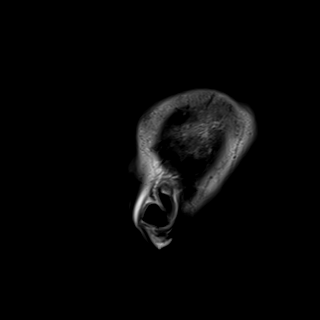
[im 25/25]
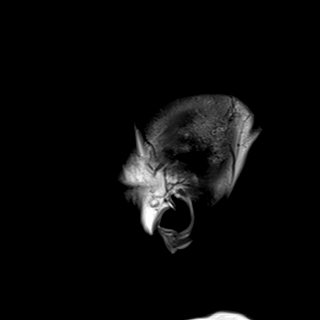

[Series 10: T2 · axial · 5.0mm · 0.72mm/px · z∈[-60,+83]mm · 2 of 25 slices shown (1 of 2)]
[im 1/25]
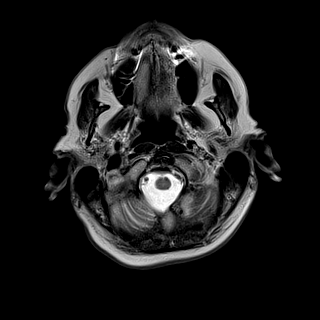
[im 25/25]
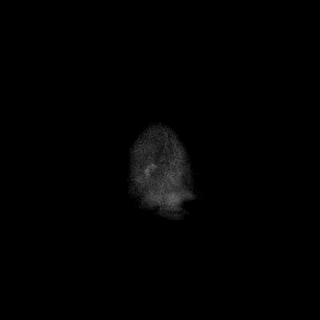

[Series 11: FLAIR · axial · 5.0mm · 0.45mm/px · z∈[-61,+83]mm · 2 of 25 slices shown]
[im 1/25]
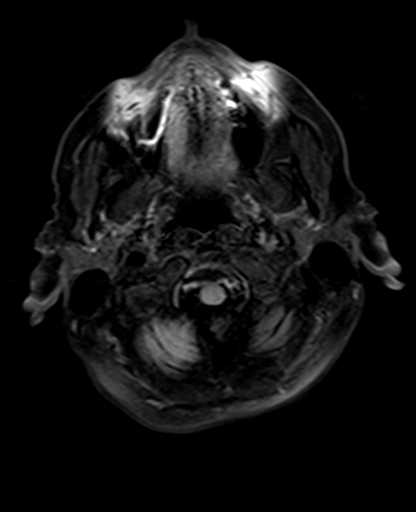
[im 25/25]
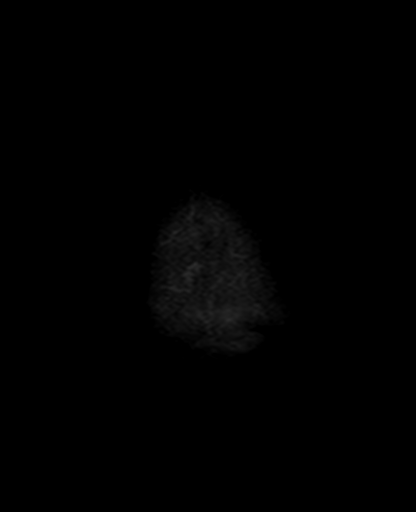

[Series 13: pha_images · axial · 3.0mm · 0.90mm/px · z∈[-77,+97]mm · 5 of 56 slices shown]
[im 1/56]
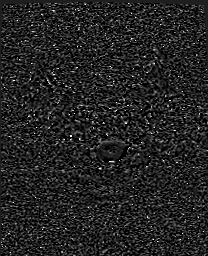
[im 14/56]
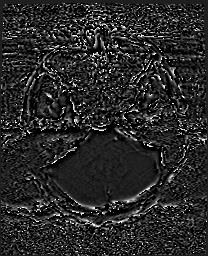
[im 28/56]
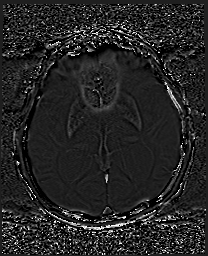
[im 42/56]
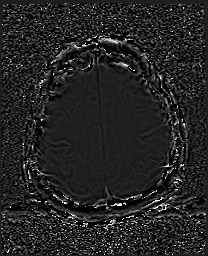
[im 56/56]
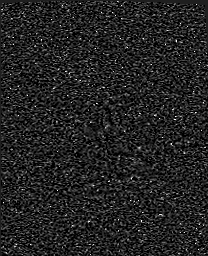

[Series 14: swi_images · axial · 3.0mm · 0.90mm/px · z∈[-77,+100]mm · 6 of 60 slices shown]
[im 1/60]
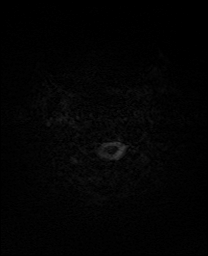
[im 12/60]
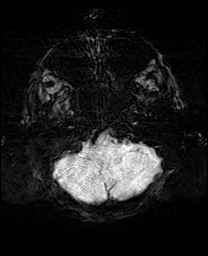
[im 24/60]
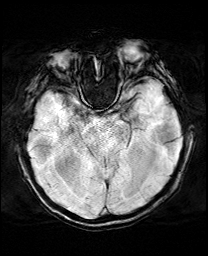
[im 36/60]
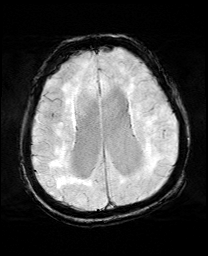
[im 48/60]
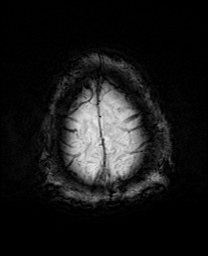
[im 60/60]
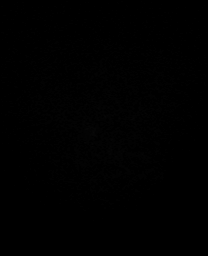

[Series 17: T2 · coronal · 5.0mm · 0.34mm/px · 3 of 29 slices shown (2 of 2)]
[im 1/29]
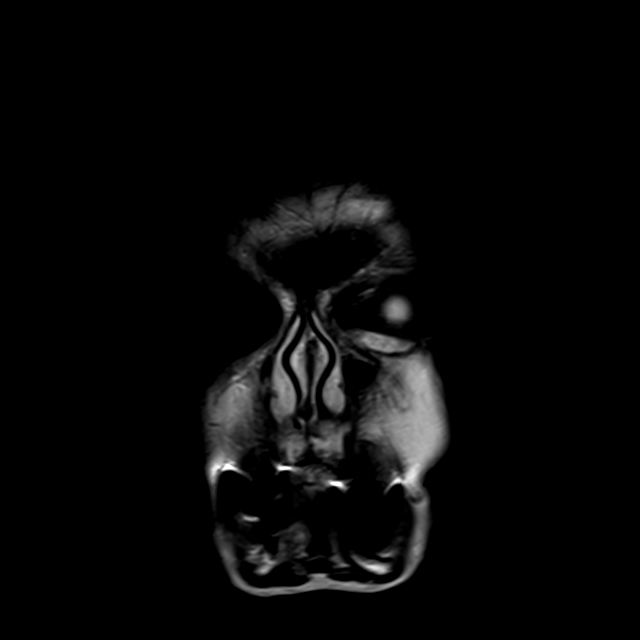
[im 15/29]
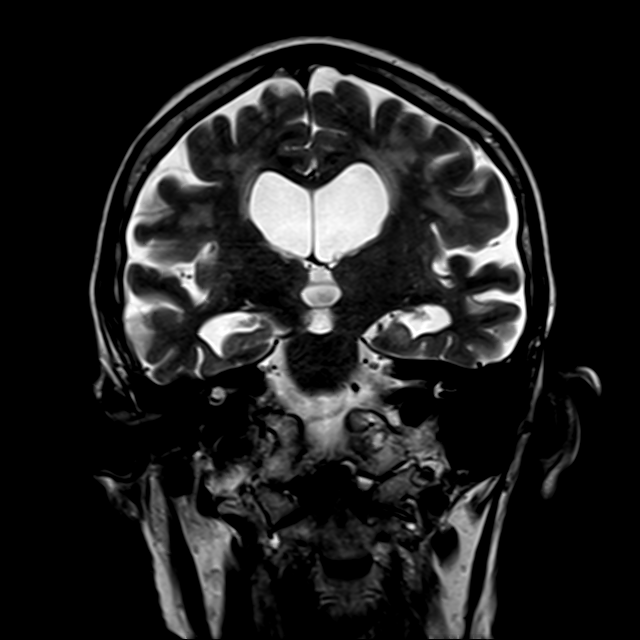
[im 29/29]
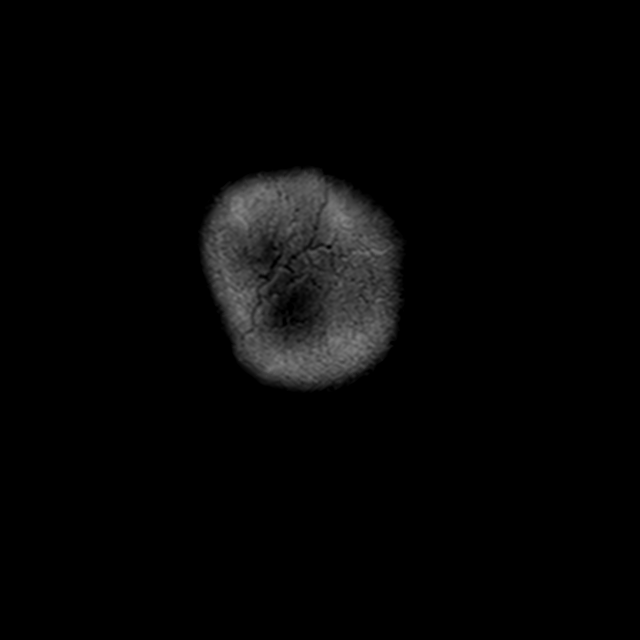

[42 of 48 positions shown; findings below may reference images not displayed]

FINDINGS: MRI HEAD FINDINGS

Brain: Subcentimeter acute infarct at the left caudate head. No
optic pathway ischemia. Clear suprasellar cistern.

Atrophy with ventriculomegaly. Medial temporal volume loss is at
least moderate but congruous with the generalized volume loss.
Chronic small vessel ischemia in the deep white matter which is
moderate to extensive. No hemorrhage, obstructive hydrocephalus, or
collection. No chronic blood products.

Vascular: Arterial findings below. Normal dural venous sinus flow
voids

Skull and upper cervical spine: Negative for marrow lesion. There is
upper cervical degenerative disease with C2-3 anterolisthesis.

Sinuses/Orbits: Bilateral cataract resection. No evidence of
intra-ocular collection or orbital inflammation.

MRA HEAD FINDINGS

Symmetric vertebral and carotid artery size. Hypoplastic right A1
segment.

Flow gap in the distal right vertebral artery from stenosis and
likely accentuated by vessel tortuosity. There is also high-grade
narrowing of the proximal basilar. Posterior circulation branch
vessels are patent. Robust posterior communicating arteries.

Hypoplastic right A1 segment. Negative for aneurysm or branch
occlusion. Symmetric signal in the proximal ophthalmic arteries
bilaterally.
IMPRESSION: Brain MRI:

1. Acute lacunar infarct at the left caudate head.
2. Generalized atrophy and extensive hemispheric small vessel
disease without significant change compared to brain MRI 1 year ago.

Intracranial MRA:

1. No emergent finding. No notable narrowing or luminal irregularity
at the level of the left cavernous ICA.
2. High-grade vertebrobasilar stenoses with robust posterior
communicating arteries.

## 2020-05-01 DIAGNOSIS — G4700 Insomnia, unspecified: Secondary | ICD-10-CM | POA: Diagnosis not present

## 2020-05-01 DIAGNOSIS — E1142 Type 2 diabetes mellitus with diabetic polyneuropathy: Secondary | ICD-10-CM | POA: Diagnosis not present

## 2020-05-01 DIAGNOSIS — E7800 Pure hypercholesterolemia, unspecified: Secondary | ICD-10-CM | POA: Diagnosis not present

## 2020-05-01 DIAGNOSIS — M19041 Primary osteoarthritis, right hand: Secondary | ICD-10-CM | POA: Diagnosis not present

## 2020-05-01 DIAGNOSIS — M1990 Unspecified osteoarthritis, unspecified site: Secondary | ICD-10-CM | POA: Diagnosis not present

## 2020-05-01 DIAGNOSIS — I10 Essential (primary) hypertension: Secondary | ICD-10-CM | POA: Diagnosis not present

## 2020-05-01 DIAGNOSIS — N183 Chronic kidney disease, stage 3 unspecified: Secondary | ICD-10-CM | POA: Diagnosis not present

## 2020-05-11 DIAGNOSIS — N183 Chronic kidney disease, stage 3 unspecified: Secondary | ICD-10-CM | POA: Diagnosis not present

## 2020-05-11 DIAGNOSIS — E7800 Pure hypercholesterolemia, unspecified: Secondary | ICD-10-CM | POA: Diagnosis not present

## 2020-05-11 DIAGNOSIS — E1142 Type 2 diabetes mellitus with diabetic polyneuropathy: Secondary | ICD-10-CM | POA: Diagnosis not present

## 2020-05-11 DIAGNOSIS — I10 Essential (primary) hypertension: Secondary | ICD-10-CM | POA: Diagnosis not present

## 2020-05-11 DIAGNOSIS — G4700 Insomnia, unspecified: Secondary | ICD-10-CM | POA: Diagnosis not present

## 2020-05-11 DIAGNOSIS — M19041 Primary osteoarthritis, right hand: Secondary | ICD-10-CM | POA: Diagnosis not present

## 2020-05-11 DIAGNOSIS — M1990 Unspecified osteoarthritis, unspecified site: Secondary | ICD-10-CM | POA: Diagnosis not present

## 2020-06-04 DIAGNOSIS — Z23 Encounter for immunization: Secondary | ICD-10-CM | POA: Diagnosis not present

## 2020-06-04 DIAGNOSIS — E1142 Type 2 diabetes mellitus with diabetic polyneuropathy: Secondary | ICD-10-CM | POA: Diagnosis not present

## 2020-06-04 DIAGNOSIS — F419 Anxiety disorder, unspecified: Secondary | ICD-10-CM | POA: Diagnosis not present

## 2020-06-17 DIAGNOSIS — Z7984 Long term (current) use of oral hypoglycemic drugs: Secondary | ICD-10-CM | POA: Diagnosis not present

## 2020-06-17 DIAGNOSIS — N183 Chronic kidney disease, stage 3 unspecified: Secondary | ICD-10-CM | POA: Diagnosis not present

## 2020-06-17 DIAGNOSIS — E1142 Type 2 diabetes mellitus with diabetic polyneuropathy: Secondary | ICD-10-CM | POA: Diagnosis not present

## 2020-06-17 DIAGNOSIS — G4700 Insomnia, unspecified: Secondary | ICD-10-CM | POA: Diagnosis not present

## 2020-06-17 DIAGNOSIS — M19041 Primary osteoarthritis, right hand: Secondary | ICD-10-CM | POA: Diagnosis not present

## 2020-06-17 DIAGNOSIS — M1990 Unspecified osteoarthritis, unspecified site: Secondary | ICD-10-CM | POA: Diagnosis not present

## 2020-06-17 DIAGNOSIS — E7800 Pure hypercholesterolemia, unspecified: Secondary | ICD-10-CM | POA: Diagnosis not present

## 2020-06-17 DIAGNOSIS — I10 Essential (primary) hypertension: Secondary | ICD-10-CM | POA: Diagnosis not present

## 2020-06-24 ENCOUNTER — Ambulatory Visit (INDEPENDENT_AMBULATORY_CARE_PROVIDER_SITE_OTHER): Payer: PPO | Admitting: Cardiovascular Disease

## 2020-06-24 ENCOUNTER — Other Ambulatory Visit: Payer: Self-pay

## 2020-06-24 ENCOUNTER — Encounter: Payer: Self-pay | Admitting: Cardiovascular Disease

## 2020-06-24 VITALS — BP 126/60 | HR 76 | Ht 64.0 in | Wt 141.0 lb

## 2020-06-24 DIAGNOSIS — I739 Peripheral vascular disease, unspecified: Secondary | ICD-10-CM | POA: Diagnosis not present

## 2020-06-24 NOTE — Assessment & Plan Note (Signed)
History of peripheral arterial disease status post right common iliac and left SFA intervention by Dr. Burt Knack 2/13.  She has difficulty walking because of orthopedic issues and gait stability still walking with a walker and sometimes requiring a wheelchair.  She specifically denies claudication.  She did have lower extremity arterial Doppler studies performed in our office 03/20/2020 that revealed stable ABIs.  Right iliac stent appears patent as does her left SFA stent with moderate disease in the left SFA just proximal to this.  Given her lack of symptoms I do not feel compelled to pursue this interventionally.  The patient agrees with this approach.  We will recheck lower extremity arterial Doppler studies in 12 months.

## 2020-06-24 NOTE — Progress Notes (Signed)
06/24/2020 Doris Carr   19-Jan-1936  [id number]  Primary Physician Doris Cruel, MD Primary Cardiologist: Doris Harp MD Doris Carr, [place]  HPI:  Doris Carr is a 84 y.o.  mildly overweight married Caucasian female mother of 44, grandmother of 4 grandchildren by her husband Doris Carr today. She was referred by Dr. Burt Carr for peripheral vascular evaluation because of known PAD with an abnormal Doppler study.I last saw her in the office  06/18/2019. She does have a history of treated hypertension, hyper lipidemia has diabetes as well. She is never had a heart attack or stroke. She had a normal cath in 2006. She is statin intolerant. She had a right common iliac artery stent and a left SFA stent performed Dr. Burt Carr 2/13. Recent Dopplers suggest right common iliac artery stenosis which is high-grade although she is completely asymptomatic from this. She has had a right total knee replacement by Dr. Gladstone Carr is currently walking with a walker mostly around the house she also complains of some instability of gait. Since I saw her 6 months ago she is currently walking with a walker.     Since I saw her a year ago she continues to do well.  She still requires a walker and a wheelchair because of weakness in her right leg probably from her orthopedic issues as well as gait instability.  She specifically denies claudication.  Dopplers of her lower extremities performed 03/20/2020 revealed patent right iliac stent, left SFA stent with moderate disease in her left SFA proximal to the previously placed stent although at this point given her lack of claudication I feel watchful waiting is the best decision.  She denies chest pain or shortness of breath.   Current Meds  Medication Sig  . acetaminophen (TYLENOL) 650 MG CR tablet Take 650 mg by mouth 2 (two) times daily as needed (for arthritis pain.).  Marland Kitchen amLODipine (NORVASC) 5 MG tablet Take 5 mg by mouth 2 (two) times daily.  Marland Kitchen aspirin  EC 81 MG EC tablet Take 1 tablet (81 mg total) by mouth daily.  . carvedilol (COREG) 25 MG tablet Take 1 tablet (25 mg total) by mouth 2 (two) times daily.  . citalopram (CELEXA) 20 MG tablet Take 20 mg by mouth daily.    . clonazePAM (KLONOPIN) 0.5 MG tablet Take 0.25-0.5 mg by mouth 2 (two) times daily. 0.25 mg in the morning, 0.5 mg in the evening  . clopidogrel (PLAVIX) 75 MG tablet Take 1 tablet (75 mg total) by mouth daily.  . Cyanocobalamin (VITAMIN B-12 PO) Take 1 tablet by mouth daily.  . Evolocumab (REPATHA SURECLICK) 578 MG/ML SOAJ Inject 140 mg into the skin every 14 (fourteen) days.  . furosemide (LASIX) 20 MG tablet Take 20 mg by mouth.  . losartan (COZAAR) 50 MG tablet Take 1 tablet (50 mg total) by mouth at bedtime.  . metFORMIN (GLUCOPHAGE) 500 MG tablet Take 500 mg by mouth at bedtime.  . ondansetron (ZOFRAN) 4 MG tablet Take 1 tablet (4 mg total) by mouth daily as needed for nausea or vomiting.  Marland Kitchen oxyCODONE-acetaminophen (PERCOCET) 5-325 MG tablet Take 1 tablet by mouth every 4 (four) hours as needed for severe pain.  . phenol (CHLORASEPTIC) 1.4 % LIQD Use as directed 1 spray in the mouth or throat as needed for throat irritation / pain.  . traZODone (DESYREL) 50 MG tablet Take 50 mg by mouth at bedtime. HALF A PILL AT BEDTIME  Allergies  Allergen Reactions  . Hydralazine Other (See Comments)    Unsure of reaction  . Ciprofloxacin Nausea And Vomiting  . Codeine Nausea And Vomiting  . Darvocet [Propoxyphene N-Acetaminophen] Nausea Only  . Darvon Nausea Only  . Hydrocodone Nausea And Vomiting  . Morphine And Related Nausea And Vomiting  . Pentazocine Lactate Nausea And Vomiting  . Potassium-Containing Compounds Nausea Only  . Procaine Hcl Nausea And Vomiting  . Sulfa Drugs Cross Reactors Rash    Social History   Socioeconomic History  . Marital status: Married    Spouse name: Not on file  . Number of children: 3  . Years of education: college  . Highest  education level: Master's degree (e.g., MA, MS, MEng, MEd, MSW, MBA)  Occupational History  . Occupation: Retired  Tobacco Use  . Smoking status: Former Smoker    Packs/day: 0.80    Years: 15.00    Pack years: 12.00    Types: Cigarettes    Quit date: 11/03/1984    Years since quitting: 35.6  . Smokeless tobacco: Never Used  Vaping Use  . Vaping Use: Never used  Substance and Sexual Activity  . Alcohol use: No    Alcohol/week: 0.0 standard drinks  . Drug use: No  . Sexual activity: Yes    Birth control/protection: Surgical, Post-menopausal    Comment: 1st intercourse 5 yo-1 partner  Other Topics Concern  . Not on file  Social History Narrative   Lives at home with her husband.   Caffeine use: 1 cup per day.   Right-handed.   Social Determinants of Health   Financial Resource Strain:   . Difficulty of Paying Living Expenses: Not on file  Food Insecurity:   . Worried About Charity fundraiser in the Last Year: Not on file  . Ran Out of Food in the Last Year: Not on file  Transportation Needs:   . Lack of Transportation (Medical): Not on file  . Lack of Transportation (Non-Medical): Not on file  Physical Activity:   . Days of Exercise per Week: Not on file  . Minutes of Exercise per Session: Not on file  Stress:   . Feeling of Stress : Not on file  Social Connections:   . Frequency of Communication with Friends and Family: Not on file  . Frequency of Social Gatherings with Friends and Family: Not on file  . Attends Religious Services: Not on file  . Active Member of Clubs or Organizations: Not on file  . Attends Archivist Meetings: Not on file  . Marital Status: Not on file  Intimate Partner Violence:   . Fear of Current or Ex-Partner: Not on file  . Emotionally Abused: Not on file  . Physically Abused: Not on file  . Sexually Abused: Not on file     Review of Systems: General: negative for chills, fever, night sweats or weight changes.    Cardiovascular: negative for chest pain, dyspnea on exertion, edema, orthopnea, palpitations, paroxysmal nocturnal dyspnea or shortness of breath Dermatological: negative for rash Respiratory: negative for cough or wheezing Urologic: negative for hematuria Abdominal: negative for nausea, vomiting, diarrhea, bright red blood per rectum, melena, or hematemesis Neurologic: negative for visual changes, syncope, or dizziness All other systems reviewed and are otherwise negative except as noted above.    Blood pressure 126/60, pulse 76, height 5\' 4"  (1.626 m), weight 141 lb (64 kg), SpO2 95 %.  General appearance: alert and no distress Neck: no adenopathy,  no carotid bruit, no JVD, supple, symmetrical, trachea midline and thyroid not enlarged, symmetric, no tenderness/mass/nodules Lungs: clear to auscultation bilaterally Heart: regular rate and rhythm, S1, S2 normal, no murmur, click, rub or gallop Extremities: extremities normal, atraumatic, no cyanosis or edema Pulses: 2+ and symmetric Skin: Skin color, texture, turgor normal. No rashes or lesions Neurologic: Alert and oriented X 3, normal strength and tone. Normal symmetric reflexes. Normal coordination and gait  EKG sinus rhythm at 76 with left bundle branch block.  I personally reviewed this EKG.  ASSESSMENT AND PLAN:   PVD (peripheral vascular disease) ([place]) History of peripheral arterial disease status post right common iliac and left SFA intervention by Dr. Burt Carr 2/13.  She has difficulty walking because of orthopedic issues and gait stability still walking with a walker and sometimes requiring a wheelchair.  She specifically denies claudication.  She did have lower extremity arterial Doppler studies performed in our office 03/20/2020 that revealed stable ABIs.  Right iliac stent appears patent as does her left SFA stent with moderate disease in the left SFA just proximal to this.  Given her lack of symptoms I do not feel compelled to  pursue this interventionally.  The patient agrees with this approach.  We will recheck lower extremity arterial Doppler studies in 12 months.      Doris Harp MD FACP,FACC,FAHA, [facility] 06/24/2020 10:52 AM

## 2020-06-24 NOTE — Patient Instructions (Signed)
Medication Instructions:  Your physician recommends that you continue on your current medications as directed. Please refer to the Current Medication list given to you today.  Testing/Procedures: Doppler testing in 1 year   Follow-Up: At [facility], you and your health needs are our priority.  As part of our continuing mission to provide you with exceptional heart care, we have created designated Provider Care Teams.  These Care Teams include your primary Cardiologist (physician) and Advanced Practice Providers (APPs -  Physician Assistants and Nurse Practitioners) who all work together to provide you with the care you need, when you need it.  We recommend signing up for the patient portal called "MyChart".  Sign up information is provided on this After Visit Summary.  MyChart is used to connect with patients for Virtual Visits (Telemedicine).  Patients are able to view lab/test results, encounter notes, upcoming appointments, etc.  Non-urgent messages can be sent to your provider as well.   To learn more about what you can do with MyChart, go to [email or web address].    Your next appointment:   12 month(s)  The format for your next appointment:   In Person  Provider:   Quay Burow, MD   Other Instructions

## 2020-06-30 ENCOUNTER — Ambulatory Visit: Payer: PPO | Attending: Internal Medicine

## 2020-06-30 DIAGNOSIS — Z23 Encounter for immunization: Secondary | ICD-10-CM

## 2020-06-30 NOTE — Progress Notes (Signed)
° °  Covid-19 Vaccination Clinic  Name:  NORRIS BODLEY    MRN: [id number] DOB: [birth year]  06/30/2020  Ms. Towe was observed post Covid-19 immunization for 30 minutes based on pre-vaccination screening without incident. She was provided with Vaccine Information Sheet and instruction to access the V-Safe system.   Ms. Isidoro was instructed to call 911 with any severe reactions post vaccine:  Difficulty breathing   Swelling of face and throat   A fast heartbeat   A bad rash all over body   Dizziness and weakness

## 2020-07-27 DIAGNOSIS — I10 Essential (primary) hypertension: Secondary | ICD-10-CM | POA: Diagnosis not present

## 2020-07-27 DIAGNOSIS — M19041 Primary osteoarthritis, right hand: Secondary | ICD-10-CM | POA: Diagnosis not present

## 2020-07-27 DIAGNOSIS — E1142 Type 2 diabetes mellitus with diabetic polyneuropathy: Secondary | ICD-10-CM | POA: Diagnosis not present

## 2020-07-27 DIAGNOSIS — E7800 Pure hypercholesterolemia, unspecified: Secondary | ICD-10-CM | POA: Diagnosis not present

## 2020-07-27 DIAGNOSIS — N183 Chronic kidney disease, stage 3 unspecified: Secondary | ICD-10-CM | POA: Diagnosis not present

## 2020-07-27 DIAGNOSIS — G4700 Insomnia, unspecified: Secondary | ICD-10-CM | POA: Diagnosis not present

## 2020-07-27 DIAGNOSIS — M1990 Unspecified osteoarthritis, unspecified site: Secondary | ICD-10-CM | POA: Diagnosis not present

## 2020-08-05 ENCOUNTER — Other Ambulatory Visit: Payer: Self-pay | Admitting: Cardiovascular Disease

## 2020-08-10 ENCOUNTER — Ambulatory Visit (INDEPENDENT_AMBULATORY_CARE_PROVIDER_SITE_OTHER): Payer: PPO | Admitting: Obstetrics and Gynecology

## 2020-08-10 ENCOUNTER — Encounter: Payer: Self-pay | Admitting: Obstetrics and Gynecology

## 2020-08-10 ENCOUNTER — Other Ambulatory Visit: Payer: Self-pay

## 2020-08-10 VITALS — BP 118/76 | Ht 64.0 in | Wt 141.0 lb

## 2020-08-10 DIAGNOSIS — Z01419 Encounter for gynecological examination (general) (routine) without abnormal findings: Secondary | ICD-10-CM | POA: Diagnosis not present

## 2020-08-10 DIAGNOSIS — N816 Rectocele: Secondary | ICD-10-CM | POA: Diagnosis not present

## 2020-08-10 NOTE — Progress Notes (Signed)
Doris Carr May 09, 1936 [id number]  SUBJECTIVE:  84 y.o. [id number] female here for a breast and pelvic exam. She has no gynecologic concerns.  Current Outpatient Medications  Medication Sig Dispense Refill  . amLODipine (NORVASC) 5 MG tablet Take 5 mg by mouth 2 (two) times daily.    Marland Kitchen aspirin EC 81 MG EC tablet Take 1 tablet (81 mg total) by mouth daily.    . carvedilol (COREG) 25 MG tablet Take 1 tablet (25 mg total) by mouth 2 (two) times daily. 180 tablet 3  . citalopram (CELEXA) 20 MG tablet Take 20 mg by mouth daily.      . clonazePAM (KLONOPIN) 0.5 MG tablet Take 0.25-0.5 mg by mouth 2 (two) times daily. 0.25 mg in the morning, 0.5 mg in the evening    . clopidogrel (PLAVIX) 75 MG tablet Take 1 tablet (75 mg total) by mouth daily. 90 tablet 4  . Cyanocobalamin (VITAMIN B-12 PO) Take 1 tablet by mouth daily.    . Evolocumab (REPATHA SURECLICK) 409 MG/ML SOAJ Inject 140 mg into the skin every 14 (fourteen) days. 6 pen 4  . furosemide (LASIX) 20 MG tablet Take 20 mg by mouth.    . losartan (COZAAR) 50 MG tablet TAKE ONE TABLET BY MOUTH EVERY EVENING 90 tablet 3  . metFORMIN (GLUCOPHAGE) 500 MG tablet Take 500 mg by mouth at bedtime.  4  . phenol (CHLORASEPTIC) 1.4 % LIQD Use as directed 1 spray in the mouth or throat as needed for throat irritation / pain.  0  . traZODone (DESYREL) 50 MG tablet Take 50 mg by mouth at bedtime. HALF A PILL AT BEDTIME    . acetaminophen (TYLENOL) 650 MG CR tablet Take 650 mg by mouth 2 (two) times daily as needed (for arthritis pain.). (Patient not taking: Reported on 08/10/2020)    . ondansetron (ZOFRAN) 4 MG tablet Take 1 tablet (4 mg total) by mouth daily as needed for nausea or vomiting. (Patient not taking: Reported on 08/10/2020) 10 tablet 1  . oxyCODONE-acetaminophen (PERCOCET) 5-325 MG tablet Take 1 tablet by mouth every 4 (four) hours as needed for severe pain. (Patient not taking: Reported on 08/10/2020) 15 tablet 0   No current facility-administered  medications for this visit.   Allergies: Hydralazine, Ciprofloxacin, Codeine, Darvocet [propoxyphene n-acetaminophen], Darvon, Hydrocodone, Morphine and related, Pentazocine lactate, Potassium-containing compounds, Procaine hcl, and Sulfa drugs cross reactors  No LMP recorded. Patient has had a hysterectomy.  Past medical history,surgical history, problem list, medications, allergies, family history and social history were all reviewed and documented as reviewed in the EPIC chart.  GYN ROS: no abnormal bleeding, pelvic pain or discharge, no breast pain or new or enlarging lumps on self exam.  No dysuria, frequency, burning, pain with urination, cloudy/malodorous urine.   OBJECTIVE:  BP 118/76   Ht 5\' 4"  (1.626 m)   Wt 141 lb (64 kg)   BMI 24.20 kg/m  The patient appears well, alert, oriented, in no distress.  BREAST EXAM: breasts appear normal, no suspicious masses, no skin or nipple changes or axillary nodes  PELVIC EXAM: VULVA: normal appearing vulva with atrophic change, no masses, tenderness or lesions, VAGINA: normal appearing vagina with trophic change, normal color and discharge, no lesions, CERVIX: surgically absent, UTERUS: surgically absent, vaginal cuff normal, ADNEXA: no masses, nontender  Chaperone: [name] present during the examination  ASSESSMENT:  84 y.o. [id number] here for a breast and pelvic exam  PLAN:   1. Postmenopausal. Prior TVH A&P  repair.  Had been on oral estradiol through last year and then stopped.  Doing fine without any significant symptoms.  No vaginal bleeding. 2. Stable rectocele noted on exam.  Asymptomatic. 3. Pap smear 2011.  No significant history of abnormal Pap smears.  Comfortable with not continuing on with Pap smear screening after discussion of the guidelines. 4. Mammogram 08/2019.  Normal breast exam today.  Plans to do her annual mammogram later this month. 5. Sigmoidoscopy in 2014.  She will follow-up for further colon cancer screening  at the recommendations of her primary care provider. 6. DEXA 2018 was normal.  Next DEXA recommended 2023 at the 5-year interval. 7. Health maintenance.  No labs today as she normally has these completed elsewhere.  Return annually or sooner, prn.  Joseph Pierini MD 08/10/20

## 2020-08-12 DIAGNOSIS — M19041 Primary osteoarthritis, right hand: Secondary | ICD-10-CM | POA: Diagnosis not present

## 2020-08-12 DIAGNOSIS — E7800 Pure hypercholesterolemia, unspecified: Secondary | ICD-10-CM | POA: Diagnosis not present

## 2020-08-12 DIAGNOSIS — N183 Chronic kidney disease, stage 3 unspecified: Secondary | ICD-10-CM | POA: Diagnosis not present

## 2020-08-12 DIAGNOSIS — G4700 Insomnia, unspecified: Secondary | ICD-10-CM | POA: Diagnosis not present

## 2020-08-12 DIAGNOSIS — I10 Essential (primary) hypertension: Secondary | ICD-10-CM | POA: Diagnosis not present

## 2020-08-12 DIAGNOSIS — E1142 Type 2 diabetes mellitus with diabetic polyneuropathy: Secondary | ICD-10-CM | POA: Diagnosis not present

## 2020-08-12 DIAGNOSIS — M1990 Unspecified osteoarthritis, unspecified site: Secondary | ICD-10-CM | POA: Diagnosis not present

## 2020-08-14 ENCOUNTER — Other Ambulatory Visit: Payer: Self-pay | Admitting: Cardiovascular Disease

## 2020-08-18 DIAGNOSIS — Z1231 Encounter for screening mammogram for malignant neoplasm of breast: Secondary | ICD-10-CM | POA: Diagnosis not present

## 2020-09-07 DIAGNOSIS — M19041 Primary osteoarthritis, right hand: Secondary | ICD-10-CM | POA: Diagnosis not present

## 2020-09-07 DIAGNOSIS — G4700 Insomnia, unspecified: Secondary | ICD-10-CM | POA: Diagnosis not present

## 2020-09-07 DIAGNOSIS — E1142 Type 2 diabetes mellitus with diabetic polyneuropathy: Secondary | ICD-10-CM | POA: Diagnosis not present

## 2020-09-07 DIAGNOSIS — I10 Essential (primary) hypertension: Secondary | ICD-10-CM | POA: Diagnosis not present

## 2020-09-07 DIAGNOSIS — M1990 Unspecified osteoarthritis, unspecified site: Secondary | ICD-10-CM | POA: Diagnosis not present

## 2020-09-07 DIAGNOSIS — N183 Chronic kidney disease, stage 3 unspecified: Secondary | ICD-10-CM | POA: Diagnosis not present

## 2020-09-07 DIAGNOSIS — E7800 Pure hypercholesterolemia, unspecified: Secondary | ICD-10-CM | POA: Diagnosis not present

## 2020-09-15 DIAGNOSIS — H40013 Open angle with borderline findings, low risk, bilateral: Secondary | ICD-10-CM | POA: Diagnosis not present

## 2020-09-15 DIAGNOSIS — H34232 Retinal artery branch occlusion, left eye: Secondary | ICD-10-CM | POA: Diagnosis not present

## 2020-10-08 DIAGNOSIS — M19041 Primary osteoarthritis, right hand: Secondary | ICD-10-CM | POA: Diagnosis not present

## 2020-10-08 DIAGNOSIS — E1142 Type 2 diabetes mellitus with diabetic polyneuropathy: Secondary | ICD-10-CM | POA: Diagnosis not present

## 2020-10-08 DIAGNOSIS — G4700 Insomnia, unspecified: Secondary | ICD-10-CM | POA: Diagnosis not present

## 2020-10-08 DIAGNOSIS — M1990 Unspecified osteoarthritis, unspecified site: Secondary | ICD-10-CM | POA: Diagnosis not present

## 2020-10-08 DIAGNOSIS — I10 Essential (primary) hypertension: Secondary | ICD-10-CM | POA: Diagnosis not present

## 2020-10-08 DIAGNOSIS — E7800 Pure hypercholesterolemia, unspecified: Secondary | ICD-10-CM | POA: Diagnosis not present

## 2020-10-08 DIAGNOSIS — N183 Chronic kidney disease, stage 3 unspecified: Secondary | ICD-10-CM | POA: Diagnosis not present

## 2020-10-15 ENCOUNTER — Ambulatory Visit: Payer: PPO | Admitting: Dermatology

## 2020-10-15 ENCOUNTER — Other Ambulatory Visit: Payer: Self-pay

## 2020-10-15 ENCOUNTER — Encounter: Payer: Self-pay | Admitting: Dermatology

## 2020-10-15 DIAGNOSIS — D0462 Carcinoma in situ of skin of left upper limb, including shoulder: Secondary | ICD-10-CM

## 2020-10-15 DIAGNOSIS — D485 Neoplasm of uncertain behavior of skin: Secondary | ICD-10-CM

## 2020-10-15 NOTE — Patient Instructions (Signed)

## 2020-11-03 ENCOUNTER — Ambulatory Visit (HOSPITAL_COMMUNITY)
Admission: RE | Admit: 2020-11-03 | Discharge: 2020-11-03 | Disposition: A | Discharge status: Home / Self Care | Payer: PPO | Source: Ambulatory Visit | Attending: Vascular Surgery | Admitting: Vascular Surgery

## 2020-11-03 ENCOUNTER — Ambulatory Visit: Payer: PPO | Admitting: Vascular Surgery

## 2020-11-03 ENCOUNTER — Encounter: Payer: Self-pay | Admitting: Vascular Surgery

## 2020-11-03 ENCOUNTER — Other Ambulatory Visit: Payer: Self-pay

## 2020-11-03 VITALS — BP 117/65 | HR 76 | Temp 98.1°F | Resp 20 | Ht 64.0 in | Wt 140.0 lb

## 2020-11-03 DIAGNOSIS — I6523 Occlusion and stenosis of bilateral carotid arteries: Secondary | ICD-10-CM

## 2020-11-03 DIAGNOSIS — Z9889 Other specified postprocedural states: Secondary | ICD-10-CM | POA: Diagnosis not present

## 2020-11-03 NOTE — Progress Notes (Signed)
Patient name: Doris Carr MRN: [id number] DOB: [birth year] Sex: female  REASON FOR VISIT: Follow-up left carotid endarterectomy  HPI: Doris Carr is a 85 y.o. female here today for follow-up.  He had a symptomatic left carotid stenosis with visual changes.  She had under went evaluation with CT scan showing irregular high-grade left carotid stenosis.  She underwent endarterectomy on 12/13/2019.  She had no postoperative complications and was discharged to home.  She has had no difficulty with minimal discomfort in her incision  11/03/20: Doris Carr returns about a year post L CEA for symptomatic carotid stenosis. No recurrent symptoms. Some difficulty with attention in regards to reading and writing. Seems sharp as a tack and still quite spry.  Current Outpatient Medications  Medication Sig Dispense Refill  . acetaminophen (TYLENOL) 650 MG CR tablet Take 650 mg by mouth 2 (two) times daily as needed (for arthritis pain.). (Patient not taking: Reported on 08/10/2020)    . acyclovir (ZOVIRAX) 400 MG tablet Take 400 mg by mouth 2 (two) times daily.    Ilean Skill Lipoic Acid 200 MG CAPS See admin instructions.    Marland Kitchen amLODipine (NORVASC) 5 MG tablet Take 5 mg by mouth 2 (two) times daily.    Marland Kitchen aspirin EC 81 MG EC tablet Take 1 tablet (81 mg total) by mouth daily.    . carvedilol (COREG) 25 MG tablet Take 1 tablet (25 mg total) by mouth 2 (two) times daily. 180 tablet 3  . citalopram (CELEXA) 20 MG tablet Take 20 mg by mouth daily.      . clonazePAM (KLONOPIN) 0.5 MG tablet Take 0.25-0.5 mg by mouth 2 (two) times daily. 0.25 mg in the morning, 0.5 mg in the evening    . clopidogrel (PLAVIX) 75 MG tablet Take 1 tablet (75 mg total) by mouth daily. 90 tablet 4  . Cyanocobalamin (VITAMIN B-12 PO) Take 1 tablet by mouth daily.    Marland Kitchen estradiol (ESTRACE) 0.5 MG tablet Take 0.5 mg by mouth daily.    . furosemide (LASIX) 20 MG tablet Take 20 mg by mouth.    Marland Kitchen l-methylfolate-B6-B12  (METANX) 3-35-2 MG TABS tablet 1 tablet    . losartan (COZAAR) 50 MG tablet TAKE ONE TABLET BY MOUTH EVERY EVENING 90 tablet 3  . metFORMIN (GLUCOPHAGE) 500 MG tablet Take 500 mg by mouth at bedtime.  4  . ondansetron (ZOFRAN) 4 MG tablet Take 1 tablet (4 mg total) by mouth daily as needed for nausea or vomiting. (Patient not taking: Reported on 08/10/2020) 10 tablet 1  . oxyCODONE-acetaminophen (PERCOCET) 5-325 MG tablet Take 1 tablet by mouth every 4 (four) hours as needed for severe pain. (Patient not taking: Reported on 08/10/2020) 15 tablet 0  . phenol (CHLORASEPTIC) 1.4 % LIQD Use as directed 1 spray in the mouth or throat as needed for throat irritation / pain.  0  . potassium chloride (KLOR-CON) 10 MEQ tablet Take 10 mEq by mouth daily.    Marland Kitchen REPATHA SURECLICK 102 MG/ML SOAJ Inject 140 mg into the skin every 14 (fourteen) days. 6 mL 4  . traZODone (DESYREL) 50 MG tablet Take 50 mg by mouth at bedtime. HALF A PILL AT BEDTIME     No current facility-administered medications for this visit.    PHYSICAL EXAM: Vitals:   11/03/20 0937 11/03/20 0940  BP: 114/62 117/65  Pulse: 76   Resp: 20   Temp: 98.1 F (36.7 C)   SpO2: 97%   Weight: 140 lb (  63.5 kg)   Height: 5\' 4"  (1.626 m)    GENERAL: The patient is a well-nourished female, in no acute distress. Carotid incision is well-healed with no bruits bilaterally.  Neurologically intact  ASSESSMENT/PLAN: Doris Carr is a 85 y.o. female status post L CEA 12/13/19 for symptomatic carotid stenosis.  No residual or recurrent disease on duplex today. Follow up yearly with carotid duplex  Yevonne Aline. Stanford Breed, MD [facility] Phone Number: [phone] 11/03/2020 9:37 AM

## 2020-11-05 ENCOUNTER — Telehealth: Payer: Self-pay | Admitting: Dermatology

## 2020-11-05 NOTE — Telephone Encounter (Signed)
Patient is calling with a progress report from her last visit with Lavonna Monarch, MD.  Patient states that she is doing great and that her finger has healed beautifully.

## 2020-11-06 DIAGNOSIS — E7800 Pure hypercholesterolemia, unspecified: Secondary | ICD-10-CM | POA: Diagnosis not present

## 2020-11-06 DIAGNOSIS — M1990 Unspecified osteoarthritis, unspecified site: Secondary | ICD-10-CM | POA: Diagnosis not present

## 2020-11-06 DIAGNOSIS — N183 Chronic kidney disease, stage 3 unspecified: Secondary | ICD-10-CM | POA: Diagnosis not present

## 2020-11-06 DIAGNOSIS — E1142 Type 2 diabetes mellitus with diabetic polyneuropathy: Secondary | ICD-10-CM | POA: Diagnosis not present

## 2020-11-06 DIAGNOSIS — I10 Essential (primary) hypertension: Secondary | ICD-10-CM | POA: Diagnosis not present

## 2020-11-06 DIAGNOSIS — G4700 Insomnia, unspecified: Secondary | ICD-10-CM | POA: Diagnosis not present

## 2020-11-06 DIAGNOSIS — M19041 Primary osteoarthritis, right hand: Secondary | ICD-10-CM | POA: Diagnosis not present

## 2020-11-06 NOTE — Telephone Encounter (Signed)
Just FYI.

## 2020-11-26 DIAGNOSIS — I10 Essential (primary) hypertension: Secondary | ICD-10-CM | POA: Diagnosis not present

## 2020-11-26 DIAGNOSIS — E7800 Pure hypercholesterolemia, unspecified: Secondary | ICD-10-CM | POA: Diagnosis not present

## 2020-11-26 DIAGNOSIS — E1142 Type 2 diabetes mellitus with diabetic polyneuropathy: Secondary | ICD-10-CM | POA: Diagnosis not present

## 2020-11-26 DIAGNOSIS — F419 Anxiety disorder, unspecified: Secondary | ICD-10-CM | POA: Diagnosis not present

## 2020-12-04 DIAGNOSIS — Z0000 Encounter for general adult medical examination without abnormal findings: Secondary | ICD-10-CM | POA: Diagnosis not present

## 2020-12-04 DIAGNOSIS — Z1389 Encounter for screening for other disorder: Secondary | ICD-10-CM | POA: Diagnosis not present

## 2020-12-07 DIAGNOSIS — M1990 Unspecified osteoarthritis, unspecified site: Secondary | ICD-10-CM | POA: Diagnosis not present

## 2020-12-07 DIAGNOSIS — E1142 Type 2 diabetes mellitus with diabetic polyneuropathy: Secondary | ICD-10-CM | POA: Diagnosis not present

## 2020-12-07 DIAGNOSIS — I10 Essential (primary) hypertension: Secondary | ICD-10-CM | POA: Diagnosis not present

## 2020-12-07 DIAGNOSIS — N183 Chronic kidney disease, stage 3 unspecified: Secondary | ICD-10-CM | POA: Diagnosis not present

## 2020-12-07 DIAGNOSIS — M19041 Primary osteoarthritis, right hand: Secondary | ICD-10-CM | POA: Diagnosis not present

## 2020-12-07 DIAGNOSIS — E7800 Pure hypercholesterolemia, unspecified: Secondary | ICD-10-CM | POA: Diagnosis not present

## 2020-12-07 DIAGNOSIS — G4700 Insomnia, unspecified: Secondary | ICD-10-CM | POA: Diagnosis not present

## 2020-12-10 DIAGNOSIS — I10 Essential (primary) hypertension: Secondary | ICD-10-CM | POA: Diagnosis not present

## 2020-12-10 DIAGNOSIS — G629 Polyneuropathy, unspecified: Secondary | ICD-10-CM | POA: Diagnosis not present

## 2020-12-10 DIAGNOSIS — N952 Postmenopausal atrophic vaginitis: Secondary | ICD-10-CM | POA: Diagnosis not present

## 2020-12-10 DIAGNOSIS — F419 Anxiety disorder, unspecified: Secondary | ICD-10-CM | POA: Diagnosis not present

## 2020-12-10 DIAGNOSIS — E1142 Type 2 diabetes mellitus with diabetic polyneuropathy: Secondary | ICD-10-CM | POA: Diagnosis not present

## 2020-12-10 DIAGNOSIS — G4700 Insomnia, unspecified: Secondary | ICD-10-CM | POA: Diagnosis not present

## 2020-12-10 DIAGNOSIS — N183 Chronic kidney disease, stage 3 unspecified: Secondary | ICD-10-CM | POA: Diagnosis not present

## 2020-12-10 DIAGNOSIS — I739 Peripheral vascular disease, unspecified: Secondary | ICD-10-CM | POA: Diagnosis not present

## 2021-01-25 DIAGNOSIS — I10 Essential (primary) hypertension: Secondary | ICD-10-CM | POA: Diagnosis not present

## 2021-01-25 DIAGNOSIS — E7800 Pure hypercholesterolemia, unspecified: Secondary | ICD-10-CM | POA: Diagnosis not present

## 2021-01-25 DIAGNOSIS — M19041 Primary osteoarthritis, right hand: Secondary | ICD-10-CM | POA: Diagnosis not present

## 2021-01-25 DIAGNOSIS — G4700 Insomnia, unspecified: Secondary | ICD-10-CM | POA: Diagnosis not present

## 2021-01-25 DIAGNOSIS — N183 Chronic kidney disease, stage 3 unspecified: Secondary | ICD-10-CM | POA: Diagnosis not present

## 2021-01-25 DIAGNOSIS — M1990 Unspecified osteoarthritis, unspecified site: Secondary | ICD-10-CM | POA: Diagnosis not present

## 2021-01-25 DIAGNOSIS — E1142 Type 2 diabetes mellitus with diabetic polyneuropathy: Secondary | ICD-10-CM | POA: Diagnosis not present

## 2021-02-22 DIAGNOSIS — G4700 Insomnia, unspecified: Secondary | ICD-10-CM | POA: Diagnosis not present

## 2021-02-22 DIAGNOSIS — I10 Essential (primary) hypertension: Secondary | ICD-10-CM | POA: Diagnosis not present

## 2021-02-22 DIAGNOSIS — E7800 Pure hypercholesterolemia, unspecified: Secondary | ICD-10-CM | POA: Diagnosis not present

## 2021-02-22 DIAGNOSIS — M1990 Unspecified osteoarthritis, unspecified site: Secondary | ICD-10-CM | POA: Diagnosis not present

## 2021-02-22 DIAGNOSIS — E1142 Type 2 diabetes mellitus with diabetic polyneuropathy: Secondary | ICD-10-CM | POA: Diagnosis not present

## 2021-02-22 DIAGNOSIS — M19041 Primary osteoarthritis, right hand: Secondary | ICD-10-CM | POA: Diagnosis not present

## 2021-02-22 DIAGNOSIS — N183 Chronic kidney disease, stage 3 unspecified: Secondary | ICD-10-CM | POA: Diagnosis not present

## 2021-03-05 ENCOUNTER — Other Ambulatory Visit: Payer: Self-pay | Admitting: Neurology

## 2021-03-05 ENCOUNTER — Other Ambulatory Visit (HOSPITAL_COMMUNITY): Payer: Self-pay | Admitting: Cardiovascular Disease

## 2021-03-05 DIAGNOSIS — I739 Peripheral vascular disease, unspecified: Secondary | ICD-10-CM

## 2021-03-16 DIAGNOSIS — N183 Chronic kidney disease, stage 3 unspecified: Secondary | ICD-10-CM | POA: Diagnosis not present

## 2021-03-16 DIAGNOSIS — G4700 Insomnia, unspecified: Secondary | ICD-10-CM | POA: Diagnosis not present

## 2021-03-16 DIAGNOSIS — I10 Essential (primary) hypertension: Secondary | ICD-10-CM | POA: Diagnosis not present

## 2021-03-16 DIAGNOSIS — H04123 Dry eye syndrome of bilateral lacrimal glands: Secondary | ICD-10-CM | POA: Diagnosis not present

## 2021-03-16 DIAGNOSIS — Z961 Presence of intraocular lens: Secondary | ICD-10-CM | POA: Diagnosis not present

## 2021-03-16 DIAGNOSIS — E7800 Pure hypercholesterolemia, unspecified: Secondary | ICD-10-CM | POA: Diagnosis not present

## 2021-03-16 DIAGNOSIS — M1990 Unspecified osteoarthritis, unspecified site: Secondary | ICD-10-CM | POA: Diagnosis not present

## 2021-03-16 DIAGNOSIS — E1142 Type 2 diabetes mellitus with diabetic polyneuropathy: Secondary | ICD-10-CM | POA: Diagnosis not present

## 2021-03-16 DIAGNOSIS — H40013 Open angle with borderline findings, low risk, bilateral: Secondary | ICD-10-CM | POA: Diagnosis not present

## 2021-03-16 DIAGNOSIS — H34232 Retinal artery branch occlusion, left eye: Secondary | ICD-10-CM | POA: Diagnosis not present

## 2021-03-16 DIAGNOSIS — M19041 Primary osteoarthritis, right hand: Secondary | ICD-10-CM | POA: Diagnosis not present

## 2021-03-22 ENCOUNTER — Ambulatory Visit (HOSPITAL_BASED_OUTPATIENT_CLINIC_OR_DEPARTMENT_OTHER)
Admission: RE | Admit: 2021-03-22 | Discharge: 2021-03-22 | Disposition: A | Discharge status: Home / Self Care | Payer: PPO | Source: Ambulatory Visit | Attending: Cardiology | Admitting: Cardiology

## 2021-03-22 ENCOUNTER — Encounter (HOSPITAL_COMMUNITY): Payer: PPO

## 2021-03-22 ENCOUNTER — Other Ambulatory Visit: Payer: Self-pay

## 2021-03-22 ENCOUNTER — Other Ambulatory Visit (HOSPITAL_COMMUNITY): Payer: Self-pay | Admitting: Cardiovascular Disease

## 2021-03-22 ENCOUNTER — Ambulatory Visit (HOSPITAL_COMMUNITY)
Admission: RE | Admit: 2021-03-22 | Discharge: 2021-03-22 | Disposition: A | Discharge status: Home / Self Care | Payer: PPO | Source: Ambulatory Visit | Attending: Cardiology | Admitting: Cardiology

## 2021-03-22 DIAGNOSIS — Z95828 Presence of other vascular implants and grafts: Secondary | ICD-10-CM

## 2021-03-22 DIAGNOSIS — I739 Peripheral vascular disease, unspecified: Secondary | ICD-10-CM

## 2021-03-22 DIAGNOSIS — Z95820 Peripheral vascular angioplasty status with implants and grafts: Secondary | ICD-10-CM

## 2021-03-24 ENCOUNTER — Inpatient Hospital Stay (HOSPITAL_COMMUNITY): Admission: RE | Admit: 2021-03-24 | Payer: PPO | Source: Ambulatory Visit

## 2021-03-26 DIAGNOSIS — H04123 Dry eye syndrome of bilateral lacrimal glands: Secondary | ICD-10-CM | POA: Diagnosis not present

## 2021-03-26 DIAGNOSIS — H34232 Retinal artery branch occlusion, left eye: Secondary | ICD-10-CM | POA: Diagnosis not present

## 2021-03-26 DIAGNOSIS — H40013 Open angle with borderline findings, low risk, bilateral: Secondary | ICD-10-CM | POA: Diagnosis not present

## 2021-04-06 DIAGNOSIS — N183 Chronic kidney disease, stage 3 unspecified: Secondary | ICD-10-CM | POA: Diagnosis not present

## 2021-04-06 DIAGNOSIS — E1142 Type 2 diabetes mellitus with diabetic polyneuropathy: Secondary | ICD-10-CM | POA: Diagnosis not present

## 2021-04-06 DIAGNOSIS — M1990 Unspecified osteoarthritis, unspecified site: Secondary | ICD-10-CM | POA: Diagnosis not present

## 2021-04-06 DIAGNOSIS — I10 Essential (primary) hypertension: Secondary | ICD-10-CM | POA: Diagnosis not present

## 2021-04-06 DIAGNOSIS — G4700 Insomnia, unspecified: Secondary | ICD-10-CM | POA: Diagnosis not present

## 2021-04-06 DIAGNOSIS — M19041 Primary osteoarthritis, right hand: Secondary | ICD-10-CM | POA: Diagnosis not present

## 2021-04-06 DIAGNOSIS — E7800 Pure hypercholesterolemia, unspecified: Secondary | ICD-10-CM | POA: Diagnosis not present

## 2021-05-06 DIAGNOSIS — M1990 Unspecified osteoarthritis, unspecified site: Secondary | ICD-10-CM | POA: Diagnosis not present

## 2021-05-06 DIAGNOSIS — N183 Chronic kidney disease, stage 3 unspecified: Secondary | ICD-10-CM | POA: Diagnosis not present

## 2021-05-06 DIAGNOSIS — E1142 Type 2 diabetes mellitus with diabetic polyneuropathy: Secondary | ICD-10-CM | POA: Diagnosis not present

## 2021-05-06 DIAGNOSIS — M19041 Primary osteoarthritis, right hand: Secondary | ICD-10-CM | POA: Diagnosis not present

## 2021-05-06 DIAGNOSIS — G4700 Insomnia, unspecified: Secondary | ICD-10-CM | POA: Diagnosis not present

## 2021-05-06 DIAGNOSIS — E7800 Pure hypercholesterolemia, unspecified: Secondary | ICD-10-CM | POA: Diagnosis not present

## 2021-05-06 DIAGNOSIS — I10 Essential (primary) hypertension: Secondary | ICD-10-CM | POA: Diagnosis not present

## 2021-05-08 ENCOUNTER — Encounter: Payer: Self-pay | Admitting: Dermatology

## 2021-05-08 NOTE — Progress Notes (Signed)
   Follow-Up Visit   Subjective  Doris Carr is a 85 y.o. female who presents for the following: New Patient (Initial Visit) (Patient's husband in the room with her today. Patient is here today for growth on left middle finger x 1 year no bleeding, no pain.).  Growth left hand Location:  Duration:  Quality:  Associated Signs/Symptoms: Modifying Factors:  Severity:  Timing: Context:   Objective  Well appearing patient in no apparent distress; mood and affect are within normal limits. Left Proximal 3rd Finger-Base Hypertrophic pink 8 mm crust         All sun exposed areas plus back examined.   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, left Left Proximal 3rd Finger-Base  Skin / nail biopsy Type of biopsy: tangential   Informed consent: discussed and consent obtained   Timeout: patient name, date of birth, surgical site, and procedure verified   Procedure prep:  Patient was prepped and draped in usual sterile fashion (Non sterile) Prep type:  Chlorhexidine Anesthesia: the lesion was anesthetized in a standard fashion   Anesthetic:  1% lidocaine w/ epinephrine 1-100,000 local infiltration Instrument used: flexible razor blade   Hemostasis achieved with: ferric subsulfate   Outcome: patient tolerated procedure well   Post-procedure details: wound care instructions given    Destruction of lesion Complexity: simple   Destruction method: electrodesiccation and curettage   Informed consent: discussed and consent obtained   Timeout:  patient name, date of birth, surgical site, and procedure verified Anesthesia: the lesion was anesthetized in a standard fashion   Anesthetic:  1% lidocaine w/ epinephrine 1-100,000 local infiltration Curettage performed in three different directions: Yes   Electrodesiccation performed over the curetted area: Yes   Curettage cycles:  3 Lesion length (cm):  1.1 Lesion width (cm):  1.1 Margin per side (cm):   0 Final wound size (cm):  1.1 Hemostasis achieved with:  ferric subsulfate and electrodesiccation Outcome: patient tolerated procedure well with no complications   Post-procedure details: sterile dressing applied and wound care instructions given   Dressing type: bandage and petrolatum    Specimen 1 - Surgical pathology Differential Diagnosis: R/O BCC vs SCC  Check Margins: No  Treated after biopsy  After shave biopsy the base and margins were treated with curettage plus cautery      I, Lavonna Monarch, MD, have reviewed all documentation for this visit.  The documentation on 05/08/21 for the exam, diagnosis, procedures, and orders are all accurate and complete.

## 2021-06-23 DIAGNOSIS — E1142 Type 2 diabetes mellitus with diabetic polyneuropathy: Secondary | ICD-10-CM | POA: Diagnosis not present

## 2021-06-23 DIAGNOSIS — F419 Anxiety disorder, unspecified: Secondary | ICD-10-CM | POA: Diagnosis not present

## 2021-06-23 DIAGNOSIS — Z23 Encounter for immunization: Secondary | ICD-10-CM | POA: Diagnosis not present

## 2021-06-23 DIAGNOSIS — I10 Essential (primary) hypertension: Secondary | ICD-10-CM | POA: Diagnosis not present

## 2021-06-23 DIAGNOSIS — E7800 Pure hypercholesterolemia, unspecified: Secondary | ICD-10-CM | POA: Diagnosis not present

## 2021-07-01 DIAGNOSIS — M1990 Unspecified osteoarthritis, unspecified site: Secondary | ICD-10-CM | POA: Diagnosis not present

## 2021-07-01 DIAGNOSIS — N183 Chronic kidney disease, stage 3 unspecified: Secondary | ICD-10-CM | POA: Diagnosis not present

## 2021-07-01 DIAGNOSIS — E7800 Pure hypercholesterolemia, unspecified: Secondary | ICD-10-CM | POA: Diagnosis not present

## 2021-07-01 DIAGNOSIS — M19041 Primary osteoarthritis, right hand: Secondary | ICD-10-CM | POA: Diagnosis not present

## 2021-07-01 DIAGNOSIS — E1142 Type 2 diabetes mellitus with diabetic polyneuropathy: Secondary | ICD-10-CM | POA: Diagnosis not present

## 2021-07-01 DIAGNOSIS — I10 Essential (primary) hypertension: Secondary | ICD-10-CM | POA: Diagnosis not present

## 2021-07-01 DIAGNOSIS — G4700 Insomnia, unspecified: Secondary | ICD-10-CM | POA: Diagnosis not present

## 2021-07-27 ENCOUNTER — Other Ambulatory Visit: Payer: Self-pay | Admitting: Cardiovascular Disease

## 2021-08-02 DIAGNOSIS — E1142 Type 2 diabetes mellitus with diabetic polyneuropathy: Secondary | ICD-10-CM | POA: Diagnosis not present

## 2021-08-02 DIAGNOSIS — G4700 Insomnia, unspecified: Secondary | ICD-10-CM | POA: Diagnosis not present

## 2021-08-02 DIAGNOSIS — I10 Essential (primary) hypertension: Secondary | ICD-10-CM | POA: Diagnosis not present

## 2021-08-02 DIAGNOSIS — M1990 Unspecified osteoarthritis, unspecified site: Secondary | ICD-10-CM | POA: Diagnosis not present

## 2021-08-02 DIAGNOSIS — N183 Chronic kidney disease, stage 3 unspecified: Secondary | ICD-10-CM | POA: Diagnosis not present

## 2021-08-02 DIAGNOSIS — E7800 Pure hypercholesterolemia, unspecified: Secondary | ICD-10-CM | POA: Diagnosis not present

## 2021-08-24 DIAGNOSIS — Z1231 Encounter for screening mammogram for malignant neoplasm of breast: Secondary | ICD-10-CM | POA: Diagnosis not present

## 2021-08-31 ENCOUNTER — Other Ambulatory Visit: Payer: Self-pay | Admitting: Cardiovascular Disease

## 2021-08-31 DIAGNOSIS — M19041 Primary osteoarthritis, right hand: Secondary | ICD-10-CM | POA: Diagnosis not present

## 2021-08-31 DIAGNOSIS — G4700 Insomnia, unspecified: Secondary | ICD-10-CM | POA: Diagnosis not present

## 2021-08-31 DIAGNOSIS — M1990 Unspecified osteoarthritis, unspecified site: Secondary | ICD-10-CM | POA: Diagnosis not present

## 2021-08-31 DIAGNOSIS — N183 Chronic kidney disease, stage 3 unspecified: Secondary | ICD-10-CM | POA: Diagnosis not present

## 2021-08-31 DIAGNOSIS — I10 Essential (primary) hypertension: Secondary | ICD-10-CM | POA: Diagnosis not present

## 2021-08-31 DIAGNOSIS — E1142 Type 2 diabetes mellitus with diabetic polyneuropathy: Secondary | ICD-10-CM | POA: Diagnosis not present

## 2021-08-31 DIAGNOSIS — E7800 Pure hypercholesterolemia, unspecified: Secondary | ICD-10-CM | POA: Diagnosis not present

## 2021-09-30 DIAGNOSIS — R928 Other abnormal and inconclusive findings on diagnostic imaging of breast: Secondary | ICD-10-CM | POA: Diagnosis not present

## 2021-09-30 DIAGNOSIS — R922 Inconclusive mammogram: Secondary | ICD-10-CM | POA: Diagnosis not present

## 2021-09-30 DIAGNOSIS — R921 Mammographic calcification found on diagnostic imaging of breast: Secondary | ICD-10-CM | POA: Diagnosis not present

## 2021-10-02 DIAGNOSIS — N183 Chronic kidney disease, stage 3 unspecified: Secondary | ICD-10-CM | POA: Diagnosis not present

## 2021-10-02 DIAGNOSIS — E7800 Pure hypercholesterolemia, unspecified: Secondary | ICD-10-CM | POA: Diagnosis not present

## 2021-10-02 DIAGNOSIS — G4700 Insomnia, unspecified: Secondary | ICD-10-CM | POA: Diagnosis not present

## 2021-10-02 DIAGNOSIS — M1990 Unspecified osteoarthritis, unspecified site: Secondary | ICD-10-CM | POA: Diagnosis not present

## 2021-10-02 DIAGNOSIS — E1142 Type 2 diabetes mellitus with diabetic polyneuropathy: Secondary | ICD-10-CM | POA: Diagnosis not present

## 2021-10-02 DIAGNOSIS — M19041 Primary osteoarthritis, right hand: Secondary | ICD-10-CM | POA: Diagnosis not present

## 2021-10-02 DIAGNOSIS — I10 Essential (primary) hypertension: Secondary | ICD-10-CM | POA: Diagnosis not present

## 2021-10-29 DIAGNOSIS — E7800 Pure hypercholesterolemia, unspecified: Secondary | ICD-10-CM | POA: Diagnosis not present

## 2021-10-29 DIAGNOSIS — M19041 Primary osteoarthritis, right hand: Secondary | ICD-10-CM | POA: Diagnosis not present

## 2021-10-29 DIAGNOSIS — M1990 Unspecified osteoarthritis, unspecified site: Secondary | ICD-10-CM | POA: Diagnosis not present

## 2021-10-29 DIAGNOSIS — N183 Chronic kidney disease, stage 3 unspecified: Secondary | ICD-10-CM | POA: Diagnosis not present

## 2021-10-29 DIAGNOSIS — I10 Essential (primary) hypertension: Secondary | ICD-10-CM | POA: Diagnosis not present

## 2021-10-29 DIAGNOSIS — G4700 Insomnia, unspecified: Secondary | ICD-10-CM | POA: Diagnosis not present

## 2021-10-29 DIAGNOSIS — E1142 Type 2 diabetes mellitus with diabetic polyneuropathy: Secondary | ICD-10-CM | POA: Diagnosis not present

## 2021-11-27 ENCOUNTER — Other Ambulatory Visit: Payer: Self-pay

## 2021-11-27 DIAGNOSIS — I6529 Occlusion and stenosis of unspecified carotid artery: Secondary | ICD-10-CM

## 2021-11-29 NOTE — Progress Notes (Unsigned)
° °  Patient name: Doris Carr MRN: [id number] DOB: 1936-01-04 Sex: female  REASON FOR VISIT: Follow-up left carotid endarterectomy  HPI: Doris Carr is a 86 y.o. female here today for follow-up.  He had a symptomatic left carotid stenosis with visual changes.  She had under went evaluation with CT scan showing irregular high-grade left carotid stenosis.  She underwent endarterectomy on 12/13/2019.  She had no postoperative complications and was discharged to home.  She has had no difficulty with minimal discomfort in her incision  11/03/20: Doris Carr returns about a year post L CEA for symptomatic carotid stenosis. No recurrent symptoms. Some difficulty with attention in regards to reading and writing. Seems sharp as a tack and still quite spry.  Current Outpatient Medications  Medication Sig Dispense Refill   acetaminophen (TYLENOL) 650 MG CR tablet Take 650 mg by mouth 2 (two) times daily as needed (for arthritis pain.). (Patient not taking: No sig reported)     acyclovir (ZOVIRAX) 400 MG tablet Take 400 mg by mouth 2 (two) times daily.     Alpha Lipoic Acid 200 MG CAPS See admin instructions.     amLODipine (NORVASC) 5 MG tablet Take 5 mg by mouth 2 (two) times daily.     aspirin EC 81 MG EC tablet Take 1 tablet (81 mg total) by mouth daily.     carvedilol (COREG) 25 MG tablet Take 1 tablet (25 mg total) by mouth 2 (two) times daily. 180 tablet 3   citalopram (CELEXA) 20 MG tablet Take 20 mg by mouth daily.       clonazePAM (KLONOPIN) 0.5 MG tablet Take 0.25-0.5 mg by mouth 2 (two) times daily. 0.25 mg in the morning, 0.5 mg in the evening     clopidogrel (PLAVIX) 75 MG tablet Take 1 tablet (75 mg total) by mouth daily. 90 tablet 4   Cyanocobalamin (VITAMIN B-12 PO) Take 1 tablet by mouth daily.     estradiol (ESTRACE) 0.5 MG tablet Take 0.5 mg by mouth daily.     Evolocumab (REPATHA SURECLICK) 767 MG/ML SOAJ INJECT 140mg  into THE SKIN EVERY 14 DAYS 6 mL 3    furosemide (LASIX) 20 MG tablet Take 20 mg by mouth.     l-methylfolate-B6-B12 (METANX) 3-35-2 MG TABS tablet 1 tablet     losartan (COZAAR) 50 MG tablet TAKE ONE TABLET BY MOUTH EVERY EVENING 90 tablet 3   metFORMIN (GLUCOPHAGE) 500 MG tablet Take 500 mg by mouth at bedtime.  4   phenol (CHLORASEPTIC) 1.4 % LIQD Use as directed 1 spray in the mouth or throat as needed for throat irritation / pain.  0   potassium chloride (KLOR-CON) 10 MEQ tablet Take 10 mEq by mouth daily.     traZODone (DESYREL) 50 MG tablet Take 50 mg by mouth at bedtime. HALF A PILL AT BEDTIME     No current facility-administered medications for this visit.    PHYSICAL EXAM: There were no vitals filed for this visit.  GENERAL: The patient is a well-nourished female, in no acute distress. Carotid incision is well-healed with no bruits bilaterally.  Neurologically intact  ASSESSMENT/PLAN: Doris Carr is a 86 y.o. female status post L CEA 12/13/19 for symptomatic carotid stenosis.  No residual or recurrent disease on duplex today. Follow up yearly with carotid duplex  Yevonne Aline. Stanford Breed, MD [facility] Phone Number: [phone] 11/29/2021 5:24 PM

## 2021-11-30 ENCOUNTER — Ambulatory Visit: Payer: PPO | Admitting: Vascular Surgery

## 2021-11-30 ENCOUNTER — Encounter (HOSPITAL_COMMUNITY): Payer: PPO

## 2021-11-30 DIAGNOSIS — G720 Drug-induced myopathy: Secondary | ICD-10-CM | POA: Insufficient documentation

## 2021-11-30 DIAGNOSIS — M19041 Primary osteoarthritis, right hand: Secondary | ICD-10-CM | POA: Insufficient documentation

## 2021-11-30 DIAGNOSIS — J329 Chronic sinusitis, unspecified: Secondary | ICD-10-CM | POA: Insufficient documentation

## 2021-11-30 DIAGNOSIS — G629 Polyneuropathy, unspecified: Secondary | ICD-10-CM | POA: Insufficient documentation

## 2021-11-30 DIAGNOSIS — E1142 Type 2 diabetes mellitus with diabetic polyneuropathy: Secondary | ICD-10-CM | POA: Insufficient documentation

## 2021-11-30 DIAGNOSIS — I739 Peripheral vascular disease, unspecified: Secondary | ICD-10-CM | POA: Insufficient documentation

## 2021-11-30 DIAGNOSIS — M1990 Unspecified osteoarthritis, unspecified site: Secondary | ICD-10-CM | POA: Insufficient documentation

## 2021-11-30 DIAGNOSIS — R296 Repeated falls: Secondary | ICD-10-CM | POA: Insufficient documentation

## 2021-11-30 DIAGNOSIS — M549 Dorsalgia, unspecified: Secondary | ICD-10-CM | POA: Insufficient documentation

## 2021-11-30 DIAGNOSIS — L821 Other seborrheic keratosis: Secondary | ICD-10-CM | POA: Insufficient documentation

## 2021-11-30 DIAGNOSIS — G4700 Insomnia, unspecified: Secondary | ICD-10-CM | POA: Insufficient documentation

## 2021-11-30 DIAGNOSIS — N952 Postmenopausal atrophic vaginitis: Secondary | ICD-10-CM | POA: Insufficient documentation

## 2021-12-08 DIAGNOSIS — N952 Postmenopausal atrophic vaginitis: Secondary | ICD-10-CM | POA: Diagnosis not present

## 2021-12-08 DIAGNOSIS — I679 Cerebrovascular disease, unspecified: Secondary | ICD-10-CM | POA: Diagnosis not present

## 2021-12-08 DIAGNOSIS — Z0000 Encounter for general adult medical examination without abnormal findings: Secondary | ICD-10-CM | POA: Diagnosis not present

## 2021-12-08 DIAGNOSIS — I10 Essential (primary) hypertension: Secondary | ICD-10-CM | POA: Diagnosis not present

## 2021-12-08 DIAGNOSIS — G629 Polyneuropathy, unspecified: Secondary | ICD-10-CM | POA: Diagnosis not present

## 2021-12-08 DIAGNOSIS — F0390 Unspecified dementia without behavioral disturbance: Secondary | ICD-10-CM | POA: Diagnosis not present

## 2021-12-08 DIAGNOSIS — E1142 Type 2 diabetes mellitus with diabetic polyneuropathy: Secondary | ICD-10-CM | POA: Diagnosis not present

## 2021-12-08 DIAGNOSIS — I739 Peripheral vascular disease, unspecified: Secondary | ICD-10-CM | POA: Diagnosis not present

## 2021-12-08 DIAGNOSIS — F411 Generalized anxiety disorder: Secondary | ICD-10-CM | POA: Diagnosis not present

## 2021-12-08 DIAGNOSIS — R197 Diarrhea, unspecified: Secondary | ICD-10-CM | POA: Diagnosis not present

## 2021-12-08 DIAGNOSIS — E7800 Pure hypercholesterolemia, unspecified: Secondary | ICD-10-CM | POA: Diagnosis not present

## 2021-12-08 DIAGNOSIS — G4700 Insomnia, unspecified: Secondary | ICD-10-CM | POA: Diagnosis not present

## 2021-12-20 NOTE — Progress Notes (Signed)
? ?Patient name: Doris Carr MRN: [id number] DOB: [birth year] Sex: female ? ?REASON FOR VISIT: Follow-up left carotid endarterectomy ? ?HPI: ?Doris Carr is a 86 y.o. female here today for follow-up.  He had a symptomatic left carotid stenosis with visual changes.  She had under went evaluation with CT scan showing irregular high-grade left carotid stenosis.  She underwent endarterectomy on 12/13/2019.  She had no postoperative complications and was discharged to home.  She has had no difficulty with minimal discomfort in her incision ? ?11/03/20: Doris Carr returns about a year post L CEA for symptomatic carotid stenosis. No recurrent symptoms. Some difficulty with attention in regards to reading and writing. Seems sharp as a tack and still quite spry. ? ?12/21/20: Returns for carotid artery stenosis surveillance.  She is doing great.  No new issues.  No neurologic symptoms attributable to carotid artery stenosis. ? ?Current Outpatient Medications  ?Medication Sig Dispense Refill  ? acetaminophen (TYLENOL) 650 MG CR tablet Take 650 mg by mouth 2 (two) times daily as needed (for arthritis pain.). (Patient not taking: No sig reported)    ? acyclovir (ZOVIRAX) 400 MG tablet Take 400 mg by mouth 2 (two) times daily.    ? Alpha Lipoic Acid 200 MG CAPS See admin instructions.    ? amLODipine (NORVASC) 5 MG tablet Take 5 mg by mouth 2 (two) times daily.    ? aspirin EC 81 MG EC tablet Take 1 tablet (81 mg total) by mouth daily.    ? carvedilol (COREG) 25 MG tablet Take 1 tablet (25 mg total) by mouth 2 (two) times daily. 180 tablet 3  ? citalopram (CELEXA) 20 MG tablet Take 20 mg by mouth daily.      ? clonazePAM (KLONOPIN) 0.5 MG tablet Take 0.25-0.5 mg by mouth 2 (two) times daily. 0.25 mg in the morning, 0.5 mg in the evening    ? clopidogrel (PLAVIX) 75 MG tablet Take 1 tablet (75 mg total) by mouth daily. 90 tablet 4  ? Cyanocobalamin (VITAMIN B-12 PO) Take 1 tablet by mouth daily.    ?  estradiol (ESTRACE) 0.5 MG tablet Take 0.5 mg by mouth daily.    ? Evolocumab (REPATHA SURECLICK) 591 MG/ML SOAJ INJECT '140mg'$  into THE SKIN EVERY 14 DAYS 6 mL 3  ? furosemide (LASIX) 20 MG tablet Take 20 mg by mouth.    ? l-methylfolate-B6-B12 (METANX) 3-35-2 MG TABS tablet 1 tablet    ? losartan (COZAAR) 50 MG tablet TAKE ONE TABLET BY MOUTH EVERY EVENING 90 tablet 3  ? metFORMIN (GLUCOPHAGE) 500 MG tablet Take 500 mg by mouth at bedtime.  4  ? phenol (CHLORASEPTIC) 1.4 % LIQD Use as directed 1 spray in the mouth or throat as needed for throat irritation / pain.  0  ? potassium chloride (KLOR-CON) 10 MEQ tablet Take 10 mEq by mouth daily.    ? traZODone (DESYREL) 50 MG tablet Take 50 mg by mouth at bedtime. HALF A PILL AT BEDTIME    ? ?No current facility-administered medications for this visit.  ? ? ?PHYSICAL EXAM: ?Vitals:  ? 12/21/21 1530  ?BP: 138/66  ?Pulse: 65  ?Resp: 20  ?Temp: 98.2 ?F (36.8 ?C)  ?SpO2: 96%  ?Weight: 140 lb (63.5 kg)  ?Height: '5\' 4"'$  (1.626 m)  ? ? ?GENERAL: The patient is a well-nourished female, in no acute distress. ?Carotid incision is well-healed with no bruits bilaterally.  Neurologically intact ? ?ASSESSMENT/PLAN: ?Doris Carr is a 86 y.o. female  status post L CEA 12/13/19 for symptomatic carotid stenosis.  ?No residual or recurrent disease on duplex today. ?Follow up in 2 years with carotid artery duplex ? ?Yevonne Aline. Stanford Breed, MD ?[facility] [place] ?Office Phone Number: [phone] ?12/20/2021 8:00 PM ? ? ?

## 2021-12-21 ENCOUNTER — Ambulatory Visit: Payer: PPO | Admitting: Vascular Surgery

## 2021-12-21 ENCOUNTER — Encounter: Payer: Self-pay | Admitting: Vascular Surgery

## 2021-12-21 ENCOUNTER — Ambulatory Visit (HOSPITAL_COMMUNITY)
Admission: RE | Admit: 2021-12-21 | Discharge: 2021-12-21 | Disposition: A | Discharge status: Home / Self Care | Payer: PPO | Source: Ambulatory Visit | Attending: Vascular Surgery | Admitting: Vascular Surgery

## 2021-12-21 ENCOUNTER — Other Ambulatory Visit: Payer: Self-pay

## 2021-12-21 VITALS — BP 138/66 | HR 65 | Temp 98.2°F | Resp 20 | Ht 64.0 in | Wt 140.0 lb

## 2021-12-21 DIAGNOSIS — I6523 Occlusion and stenosis of bilateral carotid arteries: Secondary | ICD-10-CM | POA: Diagnosis not present

## 2021-12-21 DIAGNOSIS — I6529 Occlusion and stenosis of unspecified carotid artery: Secondary | ICD-10-CM

## 2021-12-23 DIAGNOSIS — K5900 Constipation, unspecified: Secondary | ICD-10-CM | POA: Diagnosis not present

## 2021-12-27 DIAGNOSIS — K5900 Constipation, unspecified: Secondary | ICD-10-CM | POA: Diagnosis not present

## 2022-02-01 DIAGNOSIS — M25561 Pain in right knee: Secondary | ICD-10-CM | POA: Diagnosis not present

## 2022-02-01 DIAGNOSIS — Z96651 Presence of right artificial knee joint: Secondary | ICD-10-CM | POA: Diagnosis not present

## 2022-02-15 DIAGNOSIS — H40013 Open angle with borderline findings, low risk, bilateral: Secondary | ICD-10-CM | POA: Diagnosis not present

## 2022-02-15 DIAGNOSIS — H04123 Dry eye syndrome of bilateral lacrimal glands: Secondary | ICD-10-CM | POA: Diagnosis not present

## 2022-02-15 DIAGNOSIS — Z961 Presence of intraocular lens: Secondary | ICD-10-CM | POA: Diagnosis not present

## 2022-03-03 ENCOUNTER — Other Ambulatory Visit: Payer: Self-pay | Admitting: Cardiovascular Disease

## 2022-03-03 DIAGNOSIS — I739 Peripheral vascular disease, unspecified: Secondary | ICD-10-CM

## 2022-03-14 DIAGNOSIS — M549 Dorsalgia, unspecified: Secondary | ICD-10-CM | POA: Diagnosis not present

## 2022-03-14 DIAGNOSIS — M1990 Unspecified osteoarthritis, unspecified site: Secondary | ICD-10-CM | POA: Diagnosis not present

## 2022-03-14 DIAGNOSIS — F0390 Unspecified dementia without behavioral disturbance: Secondary | ICD-10-CM | POA: Diagnosis not present

## 2022-03-14 DIAGNOSIS — G629 Polyneuropathy, unspecified: Secondary | ICD-10-CM | POA: Diagnosis not present

## 2022-03-14 DIAGNOSIS — R2689 Other abnormalities of gait and mobility: Secondary | ICD-10-CM | POA: Diagnosis not present

## 2022-03-22 DIAGNOSIS — R350 Frequency of micturition: Secondary | ICD-10-CM | POA: Diagnosis not present

## 2022-03-23 ENCOUNTER — Encounter (HOSPITAL_COMMUNITY): Payer: PPO

## 2022-03-31 DIAGNOSIS — R928 Other abnormal and inconclusive findings on diagnostic imaging of breast: Secondary | ICD-10-CM | POA: Diagnosis not present

## 2022-04-15 ENCOUNTER — Ambulatory Visit (HOSPITAL_COMMUNITY)
Admission: RE | Admit: 2022-04-15 | Discharge: 2022-04-15 | Disposition: A | Discharge status: Home / Self Care | Payer: PPO | Source: Ambulatory Visit | Attending: Cardiovascular Disease | Admitting: Cardiovascular Disease

## 2022-04-15 ENCOUNTER — Ambulatory Visit (HOSPITAL_BASED_OUTPATIENT_CLINIC_OR_DEPARTMENT_OTHER)
Admission: RE | Admit: 2022-04-15 | Discharge: 2022-04-15 | Disposition: A | Discharge status: Home / Self Care | Payer: PPO | Source: Ambulatory Visit | Attending: Cardiovascular Disease | Admitting: Cardiovascular Disease

## 2022-04-15 DIAGNOSIS — Z95828 Presence of other vascular implants and grafts: Secondary | ICD-10-CM | POA: Diagnosis not present

## 2022-04-15 DIAGNOSIS — I739 Peripheral vascular disease, unspecified: Secondary | ICD-10-CM | POA: Insufficient documentation

## 2022-05-03 ENCOUNTER — Emergency Department (HOSPITAL_COMMUNITY): Payer: PPO

## 2022-05-03 ENCOUNTER — Emergency Department (HOSPITAL_COMMUNITY)
Admission: EM | Admit: 2022-05-03 | Discharge: 2022-05-03 | Disposition: A | Discharge status: Home / Self Care | Payer: PPO | Attending: Emergency Medicine | Admitting: Emergency Medicine

## 2022-05-03 ENCOUNTER — Other Ambulatory Visit: Payer: Self-pay

## 2022-05-03 DIAGNOSIS — E1165 Type 2 diabetes mellitus with hyperglycemia: Secondary | ICD-10-CM | POA: Diagnosis not present

## 2022-05-03 DIAGNOSIS — W06XXXA Fall from bed, initial encounter: Secondary | ICD-10-CM | POA: Diagnosis not present

## 2022-05-03 DIAGNOSIS — I10 Essential (primary) hypertension: Secondary | ICD-10-CM | POA: Insufficient documentation

## 2022-05-03 DIAGNOSIS — R519 Headache, unspecified: Secondary | ICD-10-CM | POA: Diagnosis present

## 2022-05-03 DIAGNOSIS — Z7902 Long term (current) use of antithrombotics/antiplatelets: Secondary | ICD-10-CM | POA: Insufficient documentation

## 2022-05-03 DIAGNOSIS — Z79899 Other long term (current) drug therapy: Secondary | ICD-10-CM | POA: Insufficient documentation

## 2022-05-03 DIAGNOSIS — Z7984 Long term (current) use of oral hypoglycemic drugs: Secondary | ICD-10-CM | POA: Insufficient documentation

## 2022-05-03 DIAGNOSIS — Z20822 Contact with and (suspected) exposure to covid-19: Secondary | ICD-10-CM | POA: Insufficient documentation

## 2022-05-03 DIAGNOSIS — M542 Cervicalgia: Secondary | ICD-10-CM | POA: Insufficient documentation

## 2022-05-03 DIAGNOSIS — Z7982 Long term (current) use of aspirin: Secondary | ICD-10-CM | POA: Insufficient documentation

## 2022-05-03 DIAGNOSIS — S0990XA Unspecified injury of head, initial encounter: Secondary | ICD-10-CM | POA: Diagnosis not present

## 2022-05-03 DIAGNOSIS — R739 Hyperglycemia, unspecified: Secondary | ICD-10-CM | POA: Diagnosis not present

## 2022-05-03 DIAGNOSIS — R102 Pelvic and perineal pain: Secondary | ICD-10-CM | POA: Diagnosis not present

## 2022-05-03 DIAGNOSIS — F0390 Unspecified dementia without behavioral disturbance: Secondary | ICD-10-CM | POA: Diagnosis not present

## 2022-05-03 DIAGNOSIS — S199XXA Unspecified injury of neck, initial encounter: Secondary | ICD-10-CM | POA: Diagnosis not present

## 2022-05-03 DIAGNOSIS — G4489 Other headache syndrome: Secondary | ICD-10-CM | POA: Diagnosis not present

## 2022-05-03 DIAGNOSIS — I6521 Occlusion and stenosis of right carotid artery: Secondary | ICD-10-CM | POA: Diagnosis not present

## 2022-05-03 LAB — CBG MONITORING, ED
Glucose-Capillary: 414 mg/dL — ABNORMAL HIGH (ref 70–99)
Glucose-Capillary: 420 mg/dL — ABNORMAL HIGH (ref 70–99)
Glucose-Capillary: 260 mg/dL — ABNORMAL HIGH (ref 70–99)

## 2022-05-03 LAB — URINALYSIS, ROUTINE W REFLEX MICROSCOPIC
Bilirubin Urine: NEGATIVE
Glucose, UA: >=500 mg/dL — AB
Ketones, ur: NEGATIVE mg/dL
Nitrite: NEGATIVE
Protein, ur: NEGATIVE mg/dL
Specific Gravity, Urine: 1.01 (ref 1.005–1.030)
WBC, UA: >50 WBC/hpf — ABNORMAL HIGH (ref 0–5)
pH: 7 (ref 5.0–8.0)

## 2022-05-03 LAB — TROPONIN I (HIGH SENSITIVITY)
Troponin I (High Sensitivity): 16 ng/L (ref <18)
Troponin I (High Sensitivity): 16 ng/L (ref <18)

## 2022-05-03 LAB — CBC WITH DIFFERENTIAL/PLATELET
Abs Immature Granulocytes: 0.03 10*3/uL (ref 0.00–0.07)
Basophils Absolute: 0 10*3/uL (ref 0.0–0.1)
Basophils Relative: 1 %
Eosinophils Absolute: 0.3 10*3/uL (ref 0.0–0.5)
Eosinophils Relative: 5 %
HCT: 37 % (ref 36.0–46.0)
Hemoglobin: 13.4 g/dL (ref 12.0–15.0)
Immature Granulocytes: 1 %
Lymphocytes Relative: 14 %
Lymphs Abs: 0.9 10*3/uL (ref 0.7–4.0)
MCH: 33.3 pg (ref 26.0–34.0)
MCHC: 36.2 g/dL — ABNORMAL HIGH (ref 30.0–36.0)
MCV: 91.8 fL (ref 80.0–100.0)
Monocytes Absolute: 0.7 10*3/uL (ref 0.1–1.0)
Monocytes Relative: 11 %
Neutro Abs: 4.2 10*3/uL (ref 1.7–7.7)
Neutrophils Relative %: 68 %
Platelets: 150 10*3/uL (ref 150–400)
RBC: 4.03 MIL/uL (ref 3.87–5.11)
RDW: 11.3 % — ABNORMAL LOW (ref 11.5–15.5)
WBC: 6.1 10*3/uL (ref 4.0–10.5)
nRBC: 0 % (ref 0.0–0.2)

## 2022-05-03 LAB — COMPREHENSIVE METABOLIC PANEL
ALT: 11 U/L (ref 0–44)
AST: 20 U/L (ref 15–41)
Albumin: 3.2 g/dL — ABNORMAL LOW (ref 3.5–5.0)
Alkaline Phosphatase: 68 U/L (ref 38–126)
Anion gap: 9 (ref 5–15)
BUN: 20 mg/dL (ref 8–23)
CO2: 26 mmol/L (ref 22–32)
Calcium: 9.1 mg/dL (ref 8.9–10.3)
Chloride: 97 mmol/L — ABNORMAL LOW (ref 98–111)
Creatinine, Ser: 1.24 mg/dL — ABNORMAL HIGH (ref 0.44–1.00)
GFR, Estimated: 43 mL/min — ABNORMAL LOW (ref >60)
Glucose, Bld: 430 mg/dL — ABNORMAL HIGH (ref 70–99)
Potassium: 3.5 mmol/L (ref 3.5–5.1)
Sodium: 132 mmol/L — ABNORMAL LOW (ref 135–145)
Total Bilirubin: 0.8 mg/dL (ref 0.3–1.2)
Total Protein: 5.8 g/dL — ABNORMAL LOW (ref 6.5–8.1)

## 2022-05-03 LAB — RESP PANEL BY RT-PCR (FLU A&B, COVID) ARPGX2
Influenza A by PCR: NEGATIVE
Influenza B by PCR: NEGATIVE
SARS Coronavirus 2 by RT PCR: NEGATIVE

## 2022-05-03 MED ORDER — CEPHALEXIN 500 MG PO CAPS: 500.0000 mg | ORAL_CAPSULE | Freq: Two times a day (BID) | ORAL | 0 refills | Status: AC

## 2022-05-03 MED ORDER — CEPHALEXIN 500 MG PO CAPS: 500.0000 mg | ORAL_CAPSULE | Freq: Two times a day (BID) | ORAL | 0 refills | Status: DC

## 2022-05-03 MED ORDER — SODIUM CHLORIDE 0.9 % IV BOLUS
500.0000 mL | Freq: Once | INTRAVENOUS | Status: AC
  Administered 2022-05-03: 500 mL via INTRAVENOUS

## 2022-05-03 MED ORDER — ACETAMINOPHEN 500 MG PO TABS
1000.0000 mg | ORAL_TABLET | Freq: Once | ORAL | Status: AC
Start: 2022-05-03 — End: 2022-05-03
  Administered 2022-05-03: 1000 mg via ORAL
  Filled 2022-05-03: qty 2

## 2022-05-03 MED ORDER — INSULIN ASPART 100 UNIT/ML IJ SOLN
8.0000 [IU] | Freq: Once | INTRAMUSCULAR | Status: AC
  Administered 2022-05-03: 8 [IU] via INTRAVENOUS

## 2022-05-03 NOTE — ED Provider Notes (Signed)
[facility] EMERGENCY DEPARTMENT Provider Note   CSN: [id number] Arrival date & time: 05/03/22  0700     History  Chief Complaint  Patient presents with   Headache    Doris Carr is a 86 y.o. female with a past medical history of dementia, stroke on Plavix, arthritis, diabetes, and hypertension who presents to the emergency department after a fall.  Patient is accompanied by husband at bedside who provides most of the history.  He states that the patient had a headache at 10 PM yesterday, and had Tylenol.  Patient then went to sleep.  She woke up at 2 AM to use her bedside commode and fell trying to get to it.  Patient's husband notes that she was found on her back.  Patient is unable to tell me how she fell, but notes that she knows that she did fall.  Patient denies any loss of consciousness.  Her husband notes that he was afraid that she may have had another stroke, and called EMS.  Patient is currently complaining of right base of neck pain.  She denies any vision loss, sensation loss, nausea, or vomiting.  She denies any photophobia or phonophobia.  She states that her pain has not worsened at all.  She describes this to be a dull achy pain.   Headache Associated symptoms: no dizziness, no nausea, no numbness, no photophobia, no vomiting and no weakness        Home Medications Prior to Admission medications   Medication Sig Start Date End Date Taking? Authorizing Provider  acetaminophen (TYLENOL) 650 MG CR tablet Take 650 mg by mouth 2 (two) times daily as needed (for arthritis pain.).    [provider]  acyclovir (ZOVIRAX) 400 MG tablet Take 400 mg by mouth 2 (two) times daily. 07/03/20   [provider]  Alpha Lipoic Acid 200 MG CAPS See admin instructions.    [provider]  amLODipine (NORVASC) 5 MG tablet Take 5 mg by mouth 2 (two) times daily.    [provider]  aspirin EC 81 MG EC tablet Take 1 tablet (81 mg total) by  mouth daily. 12/15/19   Geradine Girt, DO  carvedilol (COREG) 25 MG tablet Take 1 tablet (25 mg total) by mouth 2 (two) times daily. 08/05/19   Lorretta Harp, MD  cephALEXin (KEFLEX) 500 MG capsule Take 1 capsule (500 mg total) by mouth 2 (two) times daily for 7 days. 05/03/22 05/10/22  Leigh Aurora, DO  citalopram (CELEXA) 20 MG tablet Take 20 mg by mouth daily.      [provider]  clonazePAM (KLONOPIN) 0.5 MG tablet Take 0.25-0.5 mg by mouth 2 (two) times daily. 0.25 mg in the morning, 0.5 mg in the evening    [provider]  clopidogrel (PLAVIX) 75 MG tablet Take 1 tablet (75 mg total) by mouth daily. 01/27/20   Marcial Pacas, MD  Cyanocobalamin (VITAMIN B-12 PO) Take 1 tablet by mouth daily.    [provider]  estradiol (ESTRACE) 0.5 MG tablet Take 0.5 mg by mouth daily. 10/14/20   [provider]  Evolocumab (REPATHA SURECLICK) 453 MG/ML SOAJ INJECT '140mg'$  into THE SKIN EVERY 14 DAYS 08/31/21   Lorretta Harp, MD  furosemide (LASIX) 20 MG tablet Take 20 mg by mouth.    [provider]  l-methylfolate-B6-B12 Glade Stanford) 3-35-2 MG TABS tablet 1 tablet 06/25/15   [provider]  losartan (COZAAR) 50 MG tablet TAKE ONE  TABLET BY MOUTH EVERY EVENING 07/27/21   Lorretta Harp, MD  metFORMIN (GLUCOPHAGE) 500 MG tablet Take 500 mg by mouth at bedtime. 09/16/16   [provider]  phenol (CHLORASEPTIC) 1.4 % LIQD Use as directed 1 spray in the mouth or throat as needed for throat irritation / pain. 12/14/19   Geradine Girt, DO  potassium chloride (KLOR-CON) 10 MEQ tablet Take 10 mEq by mouth daily. 10/14/20   [provider]  traZODone (DESYREL) 50 MG tablet Take 50 mg by mouth at bedtime. HALF A PILL AT BEDTIME    [provider]      Allergies    Ezetimibe, Fenofibrate, Gabapentin, Hydralazine, Other, Statins, Ciprofloxacin, Codeine, Darvocet [propoxyphene n-acetaminophen], Darvon, Hydrocodone, Morphine and related,  Pentazocine lactate, Potassium-containing compounds, Procaine hcl, and Sulfa drugs cross reactors    Review of Systems   Review of Systems  Eyes:  Negative for photophobia and visual disturbance.  Respiratory:  Negative for shortness of breath.   Cardiovascular:  Negative for chest pain.  Gastrointestinal:  Negative for nausea and vomiting.  Neurological:  Positive for headaches. Negative for dizziness, weakness and numbness.    Physical Exam Updated Vital Signs BP (!) 148/96 (BP Location: Left Arm)   Pulse 69   Temp 98.2 F (36.8 C) (Oral)   Resp 18   SpO2 96%  Physical Exam  General: Alert and orientated x3. Patient is resting comfortably in bed in no acute distress  Eyes: Pupils equal and reactive to light, EOM intact  Head: Normocephalic, atraumatic  Neck: Supple, nontender, full range of motion, No JVD Cardio: Regular rate and rhythm, no murmurs, rubs or gallops. 2+ pulses to bilateral upper and lower extremities  Pulmonary: Clear to ausculation bilaterally with no rales, rhonchi, and crackles  Neuro: CN II-XII intact. Sensation intact to upper and lower extremities.  Finger-to-nose intact.  Motor movements coordinated. MSK: 5/5 strength to upper and lower extremities.     ED Results / Procedures / Treatments   Labs (all labs ordered are listed, but only abnormal results are displayed) Labs Reviewed  COMPREHENSIVE METABOLIC PANEL - Abnormal; Notable for the following components:      Result Value   Sodium 132 (*)    Chloride 97 (*)    Glucose, Bld 430 (*)    Creatinine, Ser 1.24 (*)    Total Protein 5.8 (*)    Albumin 3.2 (*)    GFR, Estimated 43 (*)    All other components within normal limits  CBC WITH DIFFERENTIAL/PLATELET - Abnormal; Notable for the following components:   MCHC 36.2 (*)    RDW 11.3 (*)    All other components within normal limits  URINALYSIS, ROUTINE W REFLEX MICROSCOPIC - Abnormal; Notable for the following components:   APPearance CLOUDY  (*)    Glucose, UA >=500 (*)    Hgb urine dipstick MODERATE (*)    Leukocytes,Ua LARGE (*)    WBC, UA >50 (*)    Bacteria, UA FEW (*)    All other components within normal limits  CBG MONITORING, ED - Abnormal; Notable for the following components:   Glucose-Capillary 414 (*)    All other components within normal limits  CBG MONITORING, ED - Abnormal; Notable for the following components:   Glucose-Capillary 420 (*)    All other components within normal limits  CBG MONITORING, ED - Abnormal; Notable for the following components:   Glucose-Capillary 260 (*)    All other components within normal limits  RESP PANEL BY RT-PCR (FLU A&B, COVID) [id number]  URINE CULTURE  TROPONIN I (HIGH SENSITIVITY)  TROPONIN I (HIGH SENSITIVITY)    EKG None  Radiology DG Pelvis 1-2 Views  Result Date: 05/03/2022 CLINICAL DATA:  Pelvic tenderness, fell today EXAM: PELVIS - 1-2 VIEW COMPARISON:  None FINDINGS: Osseous demineralization. Hip and SI joint spaces preserved. No acute fracture, dislocation, or bone destruction. Scattered atherosclerotic calcifications. Increased stool within rectum and sigmoid colon. IMPRESSION: No acute osseous abnormalities. Increased stool in rectum and sigmoid colon. Electronically Signed   By: Lavonia Dana M.D.   On: 05/03/2022 09:46   CT Cervical Spine Wo Contrast  Result Date: 05/03/2022 CLINICAL DATA:  Neck trauma (Age >= 65y) EXAM: CT CERVICAL SPINE WITHOUT CONTRAST TECHNIQUE: Multidetector CT imaging of the cervical spine was performed without intravenous contrast. Multiplanar CT image reconstructions were also generated. RADIATION DOSE REDUCTION: This exam was performed according to the departmental dose-optimization program which includes automated exposure control, adjustment of the mA and/or kV according to patient size and/or use of iterative reconstruction technique. COMPARISON:  None Available. FINDINGS: Alignment: Minor degenerative listhesis. Skull base and vertebrae:  No acute fracture. Degenerative endplate irregularity. Soft tissues and spinal canal: No prevertebral fluid or swelling. No visible canal hematoma. Disc levels: Multilevel degenerative changes are present including disc space narrowing, endplate osteophytes, and facet and uncovertebral hypertrophy. Upper chest: No apical lung mass. Other: Calcified plaque at the right common carotid bifurcation. IMPRESSION: No acute cervical spine fracture. Electronically Signed   By: Macy Mis M.D.   On: 05/03/2022 08:35   CT Head Wo Contrast  Result Date: 05/03/2022 CLINICAL DATA:  Head trauma, minor (Age >= 65y) EXAM: CT HEAD WITHOUT CONTRAST TECHNIQUE: Contiguous axial images were obtained from the base of the skull through the vertex without intravenous contrast. RADIATION DOSE REDUCTION: This exam was performed according to the departmental dose-optimization program which includes automated exposure control, adjustment of the mA and/or kV according to patient size and/or use of iterative reconstruction technique. COMPARISON:  March 2021 FINDINGS: Brain: There is no acute intracranial hemorrhage, mass effect, or edema. Gray-white differentiation is preserved. There is no extra-axial fluid collection. Stable prominence of the ventricles and sulci. Patchy and confluent hypoattenuation supratentorial white matter is nonspecific but probably reflects stable chronic microvascular ischemic changes. There is a probable small calcified meningioma along left parietal convexity. Vascular: There is atherosclerotic calcification at the skull base. Skull: Calvarium is unremarkable. Sinuses/Orbits: No acute finding. Other: Mastoid air cells are clear. IMPRESSION: No evidence of acute intracranial injury. Stable chronic/nonemergent findings detailed above. Electronically Signed   By: Macy Mis M.D.   On: 05/03/2022 08:32    Procedures Procedures    Medications Ordered in ED Medications  sodium chloride 0.9 % bolus 500 mL  (0 mLs Intravenous Stopped 05/03/22 0947)  insulin aspart (novoLOG) injection 8 Units (8 Units Intravenous Given 05/03/22 0933)  acetaminophen (TYLENOL) tablet 1,000 mg (1,000 mg Oral Given 05/03/22 1145)    ED Course/ Medical Decision Making/ A&P                           Medical Decision Making This is a 86 year old female presenting to the emergency department after a fall.  Patient has a history of stroke and on Plavix, arthritis, diabetes, hypertension, and PVD.  This was an unwitnessed fall and patient is a poor historian given possible dementia.  Given her fall history, with headache, and on Plavix, most  important issue to rule out is intracranial hemorrhage.  Will obtain CT head and neck. Given this was a unwitnessed fall and patient is a poor historian due to possible dementia, will obtain work-up possible etiologies of fall.  Initial labs showing glucose elevated at 414.  Given her history of diabetes, will obtain labs to rule out DKA.  Amount and/or Complexity of Data Reviewed Independent Historian: spouse    Details: Most history reported by spouse.  He states that he did not witness the fall, but states that she was going from her bed to her bedside commode and fell.  He did not witness this, but he did find her on her back. External Data Reviewed: notes.    Details: Patient is currently followed by dermatology, vascular surgery, neurology, and cardiology. Labs: ordered.    Details: CMP, troponin, UA, Resp panel pending Radiology: ordered.    Details: CT head and neck pending ECG/medicine tests: ordered.    Details: ECG today showing sinus rhythm of 72 bpm.  Left bundle branch block noted which is unchanged from previous ECG from 06/2020.  Risk OTC drugs. Prescription drug management. Risk Details: Insulin given in the department. Risks include hypokalemia and hypoglycemia    0847: CT head showing no acute Intracranial injury.  CT cervical spine showing no acute cervical spine  fracture.  Calcified plaque found at right common carotid bifurcation.  8786: CBC showing no signs of anemia or infection. It was noted that the patient was having troubles pivoting to the wheelchair when going to the bathroom, will obtain Hip Xray at this time.   7672: CMP showing elevated glucose of 430. Corrected Na 137. Crt is slightly elevated from baseline. No concern for DKA at this time given CO2 is normal and no elevated Anion gap. Will plan to give novolog 8 units and recheck blood glucose.  0947: Troponin negative. Can rule out ACS   0949: Bilateral Hip Xray showing no acute fractures per my read    1005: Respiratory panel negative   1100: Reassessed patient.  Patient still having headache.  We will give 1000 mg p.o.  Given her trouble ambulating earlier, I speak to her husband regarding her situation.  Husband reports that this is her normal baseline ambulatory status.  Blood glucose at 260.  Urine studies pending.  1330: Reassessed the patient.  Patient was sleeping peacefully.  She states her headache is gone.  She states that she can give [place] some urine.  1440: Patient is stable for discharge at this time. Patient is resting comfortably and does not have a headache anymore.Troponins negative x2. Blood sugar came down to 260 during ED visit. There are no signs of DKA at this point. CT head and neck negative. Hip Xrays negative. This headache is likely exacerbated pain from the fall that she took today. UA showing possible infection. Start Keflex 500 mg BID for 7 days. Plan moving forward would be for patient to be discharged given that there is no indication for hospitalization. Have the patient follow up with PCP in about a week to be evaluated for headache, and to discuss good glycemic control.         Final Clinical Impression(s) / ED Diagnoses Final diagnoses:  Fall from bed, initial encounter  Hyperglycemia due to diabetes mellitus ([place])    Rx / DC Orders ED Discharge  Orders          Ordered    cephALEXin (KEFLEX) 500 MG capsule  2 times daily,  Status:  Discontinued        05/03/22 1538    cephALEXin (KEFLEX) 500 MG capsule  2 times daily        05/03/22 [place], Holly, DO 05/03/22 1545    Long, Wonda Olds, MD 05/04/22 [phone]

## 2022-05-03 NOTE — ED Notes (Signed)
Pts husband insisted on taking pt home. Husband went to get wheelchair and took pt. RN explained safety risk and education how Doris Carr was a resource. Pt is unable to walk. Husband carried pt to wheelchair.

## 2022-05-03 NOTE — Discharge Instructions (Addendum)
Doris Carr,Thank you for allowing me to take part in your care today. Here is what we discussed today.  Start taking your antibiotic. This is called Keflex. You will take this twice a day for the next 7 days.   2. Regarding your headache, Please continue to take tylenol as needed for the pain. Please call to schedule a follow up with your primary doctor to get follow up on this headache.   3. Regarding your elevated blood sugars in the emergency department, we have given you insulin here to lower the levels. Please contact your primary care physician to make appointment to be seen for your diabetes.  4. Regarding your fall, please take caution when you are moving from the bed to the bedside commode.   5. If you start developing the worst headache in the world, lose vision, nausea, or vomiting, please return.   Thank you, Dr. Posey Pronto

## 2022-05-03 NOTE — ED Notes (Signed)
Pt saturated in urine. RN and NT cleaned up pt. New sheet, chuck, and diaper applied.

## 2022-05-03 NOTE — ED Notes (Signed)
RN and NT attempted to in and out pt. Pt had just urinated. In and out attempt unsuccessful.

## 2022-05-03 NOTE — ED Triage Notes (Signed)
Pt BIB EMS due to HA that started at 10pm from last night.. Pt is from home. Hx of diabetes. Pt at baseline axox3.

## 2022-05-03 NOTE — ED Notes (Signed)
RN called [facility] for pt to be picked up.

## 2022-05-03 NOTE — ED Notes (Signed)
Pt cleaned up and new brief placed.

## 2022-05-05 ENCOUNTER — Encounter (HOSPITAL_BASED_OUTPATIENT_CLINIC_OR_DEPARTMENT_OTHER): Payer: Self-pay | Admitting: Emergency Medicine

## 2022-05-05 ENCOUNTER — Emergency Department (HOSPITAL_BASED_OUTPATIENT_CLINIC_OR_DEPARTMENT_OTHER)
Admission: EM | Admit: 2022-05-05 | Discharge: 2022-05-06 | Disposition: A | Discharge status: Home / Self Care | Payer: PPO | Attending: Emergency Medicine | Admitting: Emergency Medicine

## 2022-05-05 ENCOUNTER — Other Ambulatory Visit: Payer: Self-pay

## 2022-05-05 ENCOUNTER — Emergency Department (HOSPITAL_BASED_OUTPATIENT_CLINIC_OR_DEPARTMENT_OTHER): Payer: PPO

## 2022-05-05 DIAGNOSIS — R739 Hyperglycemia, unspecified: Secondary | ICD-10-CM

## 2022-05-05 DIAGNOSIS — Z7982 Long term (current) use of aspirin: Secondary | ICD-10-CM | POA: Diagnosis not present

## 2022-05-05 DIAGNOSIS — Z79899 Other long term (current) drug therapy: Secondary | ICD-10-CM | POA: Diagnosis not present

## 2022-05-05 DIAGNOSIS — Z7901 Long term (current) use of anticoagulants: Secondary | ICD-10-CM | POA: Insufficient documentation

## 2022-05-05 DIAGNOSIS — R5383 Other fatigue: Secondary | ICD-10-CM | POA: Diagnosis not present

## 2022-05-05 DIAGNOSIS — I10 Essential (primary) hypertension: Secondary | ICD-10-CM | POA: Insufficient documentation

## 2022-05-05 DIAGNOSIS — Z7984 Long term (current) use of oral hypoglycemic drugs: Secondary | ICD-10-CM | POA: Diagnosis not present

## 2022-05-05 DIAGNOSIS — E1165 Type 2 diabetes mellitus with hyperglycemia: Secondary | ICD-10-CM | POA: Insufficient documentation

## 2022-05-05 DIAGNOSIS — N289 Disorder of kidney and ureter, unspecified: Secondary | ICD-10-CM

## 2022-05-05 LAB — I-STAT VENOUS BLOOD GAS, ED
Acid-Base Excess: 5 mmol/L — ABNORMAL HIGH (ref 0.0–2.0)
Bicarbonate: 31.9 mmol/L — ABNORMAL HIGH (ref 20.0–28.0)
Calcium, Ion: 1.28 mmol/L (ref 1.15–1.40)
HCT: 43 % (ref 36.0–46.0)
Hemoglobin: 14.6 g/dL (ref 12.0–15.0)
O2 Saturation: 48 %
Patient temperature: 97.8
Potassium: 3.9 mmol/L (ref 3.5–5.1)
Sodium: 133 mmol/L — ABNORMAL LOW (ref 135–145)
TCO2: 33 mmol/L — ABNORMAL HIGH (ref 22–32)
pCO2, Ven: 52.8 mmHg (ref 44–60)
pH, Ven: 7.387 (ref 7.25–7.43)
pO2, Ven: 26 mmHg — CL (ref 32–45)

## 2022-05-05 LAB — CBC WITH DIFFERENTIAL/PLATELET
Abs Immature Granulocytes: 0.02 10*3/uL (ref 0.00–0.07)
Basophils Absolute: 0.1 10*3/uL (ref 0.0–0.1)
Basophils Relative: 1 %
Eosinophils Absolute: 0.3 10*3/uL (ref 0.0–0.5)
Eosinophils Relative: 5 %
HCT: 40.7 % (ref 36.0–46.0)
Hemoglobin: 14.4 g/dL (ref 12.0–15.0)
Immature Granulocytes: 0 %
Lymphocytes Relative: 23 %
Lymphs Abs: 1.4 10*3/uL (ref 0.7–4.0)
MCH: 32.7 pg (ref 26.0–34.0)
MCHC: 35.4 g/dL (ref 30.0–36.0)
MCV: 92.3 fL (ref 80.0–100.0)
Monocytes Absolute: 0.6 10*3/uL (ref 0.1–1.0)
Monocytes Relative: 10 %
Neutro Abs: 3.6 10*3/uL (ref 1.7–7.7)
Neutrophils Relative %: 61 %
Platelets: 181 10*3/uL (ref 150–400)
RBC: 4.41 MIL/uL (ref 3.87–5.11)
RDW: 11.5 % (ref 11.5–15.5)
WBC: 6 10*3/uL (ref 4.0–10.5)
nRBC: 0 % (ref 0.0–0.2)

## 2022-05-05 LAB — BASIC METABOLIC PANEL
Anion gap: 9 (ref 5–15)
BUN: 28 mg/dL — ABNORMAL HIGH (ref 8–23)
CO2: 32 mmol/L (ref 22–32)
Calcium: 10 mg/dL (ref 8.9–10.3)
Chloride: 91 mmol/L — ABNORMAL LOW (ref 98–111)
Creatinine, Ser: 1.58 mg/dL — ABNORMAL HIGH (ref 0.44–1.00)
GFR, Estimated: 32 mL/min — ABNORMAL LOW (ref >60)
Glucose, Bld: 496 mg/dL — ABNORMAL HIGH (ref 70–99)
Potassium: 3.9 mmol/L (ref 3.5–5.1)
Sodium: 132 mmol/L — ABNORMAL LOW (ref 135–145)

## 2022-05-05 LAB — CBG MONITORING, ED
Glucose-Capillary: 532 mg/dL (ref 70–99)
Glucose-Capillary: 462 mg/dL — ABNORMAL HIGH (ref 70–99)

## 2022-05-05 LAB — URINALYSIS, ROUTINE W REFLEX MICROSCOPIC
Bilirubin Urine: NEGATIVE
Glucose, UA: >1000 mg/dL — AB
Hgb urine dipstick: NEGATIVE
Ketones, ur: NEGATIVE mg/dL
Nitrite: NEGATIVE
Protein, ur: NEGATIVE mg/dL
Specific Gravity, Urine: 1.012 (ref 1.005–1.030)
pH: 5 (ref 5.0–8.0)

## 2022-05-05 MED ORDER — INSULIN ASPART 100 UNIT/ML IJ SOLN
8.0000 [IU] | Freq: Once | INTRAMUSCULAR | Status: AC
  Administered 2022-05-05: 8 [IU] via SUBCUTANEOUS

## 2022-05-05 MED ORDER — SODIUM CHLORIDE 0.9 % IV BOLUS
1000.0000 mL | Freq: Once | INTRAVENOUS | Status: AC
  Administered 2022-05-05: 1000 mL via INTRAVENOUS

## 2022-05-05 NOTE — ED Notes (Signed)
RT made two attempts to place PIV. Both PIV attempts obtained flash of blood then vein blew. First attempt in right [facility] and second attempt in right hand. RN aware.

## 2022-05-05 NOTE — ED Notes (Signed)
Pt assisted to bedside commode with 2 person staff assistance -- w/c bound at baseline.  Reports she is unable to void with purewick.  Ecchymosis noted to L knee and RLE anterior leg

## 2022-05-05 NOTE — ED Notes (Signed)
RT obtained VBG from PIV with the following results and criticals. MD notified of results and critical values if any.   Latest Reference Range & Units 05/05/22 23:05  Sample type  VENOUS  pH, Ven 7.25 - 7.43  7.387  pCO2, Ven 44 - 60 mmHg 52.8  pO2, Ven 32 - 45 mmHg 26 (LL)  TCO2 22 - 32 mmol/L 33 (H)  Acid-Base Excess 0.0 - 2.0 mmol/L 5.0 (H)  Bicarbonate 20.0 - 28.0 mmol/L 31.9 (H)  O2 Saturation % 48  Patient temperature  97.8 F  Collection site  IV start

## 2022-05-05 NOTE — ED Provider Notes (Signed)
[facility] EMERGENCY DEPT Provider Note   CSN: [id number] Arrival date & time: 05/05/22  2034     History  Chief Complaint  Patient presents with   Hyperglycemia    Doris Carr is a 86 y.o. female.  Patient arrives with fatigue and concern for high blood sugar.  Diabetes history but no longer taking metformin.  Currently on antibiotics for UTI.  She has been feeling increased fatigue, increased thirst.  Denies any fevers or chills.  History of hypertension, high cholesterol.  Nothing makes it feel worse or better.  Denies any abdominal pain, nausea, vomiting.  Concern for dehydration.  The history is provided by the patient.       Home Medications Prior to Admission medications   Medication Sig Start Date End Date Taking? Authorizing Provider  acetaminophen (TYLENOL) 650 MG CR tablet Take 650 mg by mouth 2 (two) times daily as needed (for arthritis pain.).    [provider]  acyclovir (ZOVIRAX) 400 MG tablet Take 400 mg by mouth 2 (two) times daily. 07/03/20   [provider]  Alpha Lipoic Acid 200 MG CAPS See admin instructions.    [provider]  amLODipine (NORVASC) 5 MG tablet Take 5 mg by mouth 2 (two) times daily.    [provider]  aspirin EC 81 MG EC tablet Take 1 tablet (81 mg total) by mouth daily. 12/15/19   Geradine Girt, DO  carvedilol (COREG) 25 MG tablet Take 1 tablet (25 mg total) by mouth 2 (two) times daily. 08/05/19   Lorretta Harp, MD  cephALEXin (KEFLEX) 500 MG capsule Take 1 capsule (500 mg total) by mouth 2 (two) times daily for 7 days. 05/03/22 05/10/22  Leigh Aurora, DO  citalopram (CELEXA) 20 MG tablet Take 20 mg by mouth daily.      [provider]  clonazePAM (KLONOPIN) 0.5 MG tablet Take 0.25-0.5 mg by mouth 2 (two) times daily. 0.25 mg in the morning, 0.5 mg in the evening    [provider]  clopidogrel (PLAVIX) 75 MG tablet Take 1 tablet (75 mg total) by mouth daily. 01/27/20    Marcial Pacas, MD  Cyanocobalamin (VITAMIN B-12 PO) Take 1 tablet by mouth daily.    [provider]  estradiol (ESTRACE) 0.5 MG tablet Take 0.5 mg by mouth daily. 10/14/20   [provider]  Evolocumab (REPATHA SURECLICK) 563 MG/ML SOAJ INJECT '140mg'$  into THE SKIN EVERY 14 DAYS 08/31/21   Lorretta Harp, MD  furosemide (LASIX) 20 MG tablet Take 20 mg by mouth.    [provider]  l-methylfolate-B6-B12 Glade Stanford) 3-35-2 MG TABS tablet 1 tablet 06/25/15   [provider]  losartan (COZAAR) 50 MG tablet TAKE ONE TABLET BY MOUTH EVERY EVENING 07/27/21   Lorretta Harp, MD  metFORMIN (GLUCOPHAGE) 500 MG tablet Take 500 mg by mouth at bedtime. 09/16/16   [provider]  phenol (CHLORASEPTIC) 1.4 % LIQD Use as directed 1 spray in the mouth or throat as needed for throat irritation / pain. 12/14/19   Geradine Girt, DO  potassium chloride (KLOR-CON) 10 MEQ tablet Take 10 mEq by mouth daily. 10/14/20   [provider]  traZODone (DESYREL) 50 MG tablet Take 50 mg by mouth at bedtime. HALF A PILL AT BEDTIME    [provider]      Allergies    Ezetimibe, Fenofibrate, Gabapentin, Hydralazine, Other, Statins, Ciprofloxacin, Codeine, Darvocet [propoxyphene n-acetaminophen], Darvon, Hydrocodone, Morphine and related, Pentazocine lactate,  Potassium-containing compounds, Procaine hcl, and Sulfa drugs cross reactors    Review of Systems   Review of Systems  Physical Exam Updated Vital Signs  ED Triage Vitals  Enc Vitals Group     BP 05/05/22 2136 117/63     Pulse Rate 05/05/22 2136 71     Resp 05/05/22 2136 16     Temp 05/05/22 2136 97.8 F (36.6 C)     Temp src --      SpO2 05/05/22 2136 96 %     Weight --      Height --      Head Circumference --      Peak Flow --      Pain Score 05/05/22 2136 0     Pain Loc --      Pain Edu? --      Excl. in [place]? --     Physical Exam Vitals and nursing note reviewed.  Constitutional:       General: She is not in acute distress.    Appearance: She is well-developed. She is not ill-appearing.  HENT:     Head: Normocephalic and atraumatic.     Mouth/Throat:     Mouth: Mucous membranes are dry.  Eyes:     Conjunctiva/sclera: Conjunctivae normal.  Cardiovascular:     Rate and Rhythm: Normal rate and regular rhythm.     Pulses: Normal pulses.     Heart sounds: Normal heart sounds. No murmur heard. Pulmonary:     Effort: Pulmonary effort is normal. No respiratory distress.     Breath sounds: Normal breath sounds.  Abdominal:     Palpations: Abdomen is soft.     Tenderness: There is no abdominal tenderness.  Musculoskeletal:        General: No swelling.     Cervical back: Normal range of motion and neck supple.  Skin:    General: Skin is warm and dry.     Capillary Refill: Capillary refill takes less than 2 seconds.  Neurological:     General: No focal deficit present.     Mental Status: She is alert.  Psychiatric:        Mood and Affect: Mood normal.     ED Results / Procedures / Treatments   Labs (all labs ordered are listed, but only abnormal results are displayed) Labs Reviewed  URINALYSIS, ROUTINE W REFLEX MICROSCOPIC - Abnormal; Notable for the following components:      Result Value   APPearance HAZY (*)    Glucose, UA >1,000 (*)    Leukocytes,Ua LARGE (*)    All other components within normal limits  CBG MONITORING, ED - Abnormal; Notable for the following components:   Glucose-Capillary 532 (*)    All other components within normal limits  URINE CULTURE  CBC WITH DIFFERENTIAL/PLATELET  BASIC METABOLIC PANEL  BLOOD GAS, VENOUS  BETA-HYDROXYBUTYRIC ACID    EKG EKG Interpretation  Date/Time:  Thursday May 05 2022 22:39:58 EDT Ventricular Rate:  66 PR Interval:  155 QRS Duration: 155 QT Interval:  486 QTC Calculation: 510 R Axis:   36 Text Interpretation: Sinus rhythm Left bundle branch block Confirmed by Ronnald Nian, Creola Krotz ([id number]) on 05/05/2022  10:41:53 PM  Radiology No results found.  Procedures Procedures    Medications Ordered in ED Medications  sodium chloride 0.9 % bolus 1,000 mL (has no administration in time range)    ED Course/ Medical Decision Making/ A&P  Medical Decision Making Amount and/or Complexity of Data Reviewed Labs: ordered. Radiology: ordered.   Doris Carr is here with concern for hyperglycemia.  Has not been on metformin for several months.  History of diabetes, hypertension, CKD.  Generally feeling unwell and concern for elevated blood sugar.  Normal vitals.  No fever.  Is currently on antibiotics for UTI.  Was seen in the ED several days ago and had elevated blood sugar at that time but did not restart metformin.  Has had increased thirst.  General fatigue.  Denies any chest pain or shortness of breath or abdominal pain.  Blood sugar is greater than 500 upon arrival here.  Differential diagnosis is likely hypoglycemia causing some fatigue and dehydration.  Will evaluate for DKA, ongoing infectious process with urinalysis.  Will give IV fluid bolus.  Will check blood gas.  Overall urinalysis with some leukocytes but currently on antibiotics.  No fever.  I will have any concern for sepsis at this time.  Blood work, fluids, further interventions are pending at time of handoff to oncoming ED staff.  Please see their note for further results, evaluation, disposition of the patient.  This chart was dictated using voice recognition software.  Despite best efforts to proofread,  errors can occur which can change the documentation meaning.         Final Clinical Impression(s) / ED Diagnoses Final diagnoses:  Hyperglycemia    Rx / DC Orders ED Discharge Orders     None         Lennice Sites, DO 05/05/22 2248

## 2022-05-05 NOTE — ED Provider Notes (Signed)
Care assumed from Dr. Ronnald Nian, patient with hyperglycemia and not on any medication.  Initial glucose of 532, getting IV fluids.  Labs are pending.  I have reviewed and interpreted her laboratory test, and my interpretation is hyperglycemia, hyponatremia appropriate to the degree of hyperglycemia, renal insufficiency which is slightly worse than recent values and likely related to relative dehydration.  Venous blood gas shows no evidence of acidosis, anion gap is normal, no evidence of ketoacidosis.  Normal CBC.  Hemoglobin has increased, consistent with relative dehydration.  With IV fluids and insulin, glucoses come down to 294, patient is felt to be safe for discharge.  She is discharged with prescription for metformin, recommended follow-up with PCP in 1 week.  She will need to have her renal function monitored closely.  Results for orders placed or performed during the hospital encounter of 05/05/22  CBC with Differential  Result Value Ref Range   WBC 6.0 4.0 - 10.5 K/uL   RBC 4.41 3.87 - 5.11 MIL/uL   Hemoglobin 14.4 12.0 - 15.0 g/dL   HCT 40.7 36.0 - 46.0 %   MCV 92.3 80.0 - 100.0 fL   MCH 32.7 26.0 - 34.0 pg   MCHC 35.4 30.0 - 36.0 g/dL   RDW 11.5 11.5 - 15.5 %   Platelets 181 150 - 400 K/uL   nRBC 0.0 0.0 - 0.2 %   Neutrophils Relative % 61 %   Neutro Abs 3.6 1.7 - 7.7 K/uL   Lymphocytes Relative 23 %   Lymphs Abs 1.4 0.7 - 4.0 K/uL   Monocytes Relative 10 %   Monocytes Absolute 0.6 0.1 - 1.0 K/uL   Eosinophils Relative 5 %   Eosinophils Absolute 0.3 0.0 - 0.5 K/uL   Basophils Relative 1 %   Basophils Absolute 0.1 0.0 - 0.1 K/uL   Immature Granulocytes 0 %   Abs Immature Granulocytes 0.02 0.00 - 0.07 K/uL  Basic metabolic panel  Result Value Ref Range   Sodium 132 (L) 135 - 145 mmol/L   Potassium 3.9 3.5 - 5.1 mmol/L   Chloride 91 (L) 98 - 111 mmol/L   CO2 32 22 - 32 mmol/L   Glucose, Bld 496 (H) 70 - 99 mg/dL   BUN 28 (H) 8 - 23 mg/dL   Creatinine, Ser 1.58 (H) 0.44 -  1.00 mg/dL   Calcium 10.0 8.9 - 10.3 mg/dL   GFR, Estimated 32 (L) >60 mL/min   Anion gap 9 5 - 15  Urinalysis, Routine w reflex microscopic Urine, Clean Catch  Result Value Ref Range   Color, Urine YELLOW YELLOW   APPearance HAZY (A) CLEAR   Specific Gravity, Urine 1.012 1.005 - 1.030   pH 5.0 5.0 - 8.0   Glucose, UA >1,000 (A) NEGATIVE mg/dL   Hgb urine dipstick NEGATIVE NEGATIVE   Bilirubin Urine NEGATIVE NEGATIVE   Ketones, ur NEGATIVE NEGATIVE mg/dL   Protein, ur NEGATIVE NEGATIVE mg/dL   Nitrite NEGATIVE NEGATIVE   Leukocytes,Ua LARGE (A) NEGATIVE   RBC / HPF 6-10 0 - 5 RBC/hpf   WBC, UA 21-50 0 - 5 WBC/hpf   Squamous Epithelial / LPF 11-20 0 - 5  POC CBG, ED  Result Value Ref Range   Glucose-Capillary 532 (HH) 70 - 99 mg/dL   Comment 1 RN AWARE   I-Stat venous blood gas, ED  Result Value Ref Range   pH, Ven 7.387 7.25 - 7.43   pCO2, Ven 52.8 44 - 60 mmHg   pO2, Ven 26 (  LL) 32 - 45 mmHg   Bicarbonate 31.9 (H) 20.0 - 28.0 mmol/L   TCO2 33 (H) 22 - 32 mmol/L   O2 Saturation 48 %   Acid-Base Excess 5.0 (H) 0.0 - 2.0 mmol/L   Sodium 133 (L) 135 - 145 mmol/L   Potassium 3.9 3.5 - 5.1 mmol/L   Calcium, Ion 1.28 1.15 - 1.40 mmol/L   HCT 43.0 36.0 - 46.0 %   Hemoglobin 14.6 12.0 - 15.0 g/dL   Patient temperature 97.8 F    Collection site IV start    Drawn by RT    Sample type VENOUS    Comment NOTIFIED PHYSICIAN   CBG monitoring, ED  Result Value Ref Range   Glucose-Capillary 462 (H) 70 - 99 mg/dL  CBG monitoring, ED  Result Value Ref Range   Glucose-Capillary 294 (H) 70 - 99 mg/dL   DG Chest Portable 1 View  Result Date: 05/05/2022 CLINICAL DATA:  Fatigue EXAM: PORTABLE CHEST 1 VIEW COMPARISON:  11/08/2016 FINDINGS: Cardiac shadow is within normal limits. Patient rotation to the right accentuates the mediastinum. Tortuous thoracic aorta with calcifications is noted. No focal infiltrate or sizable effusion is seen. No bony abnormality is noted. IMPRESSION: No active  disease. Electronically Signed   By: Inez Catalina M.D.   On: 05/05/2022 23:01   DG Pelvis 1-2 Views  Result Date: 05/03/2022 CLINICAL DATA:  Pelvic tenderness, fell today EXAM: PELVIS - 1-2 VIEW COMPARISON:  None FINDINGS: Osseous demineralization. Hip and SI joint spaces preserved. No acute fracture, dislocation, or bone destruction. Scattered atherosclerotic calcifications. Increased stool within rectum and sigmoid colon. IMPRESSION: No acute osseous abnormalities. Increased stool in rectum and sigmoid colon. Electronically Signed   By: Lavonia Dana M.D.   On: 05/03/2022 09:46   CT Cervical Spine Wo Contrast  Result Date: 05/03/2022 CLINICAL DATA:  Neck trauma (Age >= 65y) EXAM: CT CERVICAL SPINE WITHOUT CONTRAST TECHNIQUE: Multidetector CT imaging of the cervical spine was performed without intravenous contrast. Multiplanar CT image reconstructions were also generated. RADIATION DOSE REDUCTION: This exam was performed according to the departmental dose-optimization program which includes automated exposure control, adjustment of the mA and/or kV according to patient size and/or use of iterative reconstruction technique. COMPARISON:  None Available. FINDINGS: Alignment: Minor degenerative listhesis. Skull base and vertebrae: No acute fracture. Degenerative endplate irregularity. Soft tissues and spinal canal: No prevertebral fluid or swelling. No visible canal hematoma. Disc levels: Multilevel degenerative changes are present including disc space narrowing, endplate osteophytes, and facet and uncovertebral hypertrophy. Upper chest: No apical lung mass. Other: Calcified plaque at the right common carotid bifurcation. IMPRESSION: No acute cervical spine fracture. Electronically Signed   By: Macy Mis M.D.   On: 05/03/2022 08:35   CT Head Wo Contrast  Result Date: 05/03/2022 CLINICAL DATA:  Head trauma, minor (Age >= 65y) EXAM: CT HEAD WITHOUT CONTRAST TECHNIQUE: Contiguous axial images were obtained  from the base of the skull through the vertex without intravenous contrast. RADIATION DOSE REDUCTION: This exam was performed according to the departmental dose-optimization program which includes automated exposure control, adjustment of the mA and/or kV according to patient size and/or use of iterative reconstruction technique. COMPARISON:  March 2021 FINDINGS: Brain: There is no acute intracranial hemorrhage, mass effect, or edema. Gray-white differentiation is preserved. There is no extra-axial fluid collection. Stable prominence of the ventricles and sulci. Patchy and confluent hypoattenuation supratentorial white matter is nonspecific but probably reflects stable chronic microvascular ischemic changes. There is a probable small  calcified meningioma along left parietal convexity. Vascular: There is atherosclerotic calcification at the skull base. Skull: Calvarium is unremarkable. Sinuses/Orbits: No acute finding. Other: Mastoid air cells are clear. IMPRESSION: No evidence of acute intracranial injury. Stable chronic/nonemergent findings detailed above. Electronically Signed   By: Macy Mis M.D.   On: 05/03/2022 08:32   VAS [place] LOWER EXTREMITY ARTERIAL DUPLEX  Result Date: 04/17/2022 LOWER EXTREMITY ARTERIAL DUPLEX STUDY Patient Name:  SHIRLYN SAVIN  Date of Exam:   04/15/2022 Medical Rec #: [id number]     Accession #:    [id number] Date of Birth: 01-Dec-1935     Patient Gender: F Patient Age:   33 years Exam Location:  [facility] Procedure:      VAS [place] LOWER EXTREMITY ARTERIAL DUPLEX Referring Phys: Roderic Palau BERRY --------------------------------------------------------------------------------  Indications: Peripheral artery disease. High Risk Factors: Hypertension, hyperlipidemia, Diabetes, past history of                    smoking, prior CVA.  Vascular Interventions: Right CIA and left SFA stent placement 11/2011. Current ABI:            Non compressible ABI's Comparison Study: 03/2021-High end range 50-74%  stenosis noted in the proximal                   superficial femoral artery. Patent stent with no evidence of                   stenosis in the PRX/MID-MID SFA artery. Performing Technologist: Wilkie Aye RVT  Examination Guidelines: A complete evaluation includes B-mode imaging, spectral Doppler, color Doppler, and power Doppler as needed of all accessible portions of each vessel. Bilateral testing is considered an integral part of a complete examination. Limited examinations for reoccurring indications may be performed as noted.   +----------+--------+-----+--------+--------+--------+ LEFT      PSV cm/sRatioStenosisWaveformComments +----------+--------+-----+--------+--------+--------+ CFA Prox  120                  biphasic         +----------+--------+-----+--------+--------+--------+ CFA Distal76                   biphasic         +----------+--------+-----+--------+--------+--------+ DFA       88                   biphasic         +----------+--------+-----+--------+--------+--------+ SFA Prox  31                   biphasic         +----------+--------+-----+--------+--------+--------+ SFA Mid   53                   biphasic         +----------+--------+-----+--------+--------+--------+ SFA Distal50                   biphasic         +----------+--------+-----+--------+--------+--------+ POP Prox  20                   biphasic         +----------+--------+-----+--------+--------+--------+ POP Distal21                   biphasic         +----------+--------+-----+--------+--------+--------+ TP Trunk  33  biphasic         +----------+--------+-----+--------+--------+--------+ Unable to visualize stent struts.  Summary: Left: Atherosclerosis in the common femoral, femoral and popliteal arteries. Patent SFA with no focal stenosis; s/p stent.  See table(s) above for measurements and observations. Suggest follow up study in 12  months. Electronically signed by Kathlyn Sacramento MD on 04/17/2022 at 6:44:47 PM.    Final    VAS [place] ABI WITH/WO TBI  Result Date: 04/17/2022  LOWER EXTREMITY DOPPLER STUDY Patient Name:  BETHLEHEM LANGSTAFF  Date of Exam:   04/15/2022 Medical Rec #: [id number]     Accession #:    [id number] Date of Birth: 10-06-1935     Patient Gender: F Patient Age:   22 years Exam Location:  [facility] Procedure:      VAS [place] ABI WITH/WO TBI Referring Phys: Roderic Palau BERRY --------------------------------------------------------------------------------  Indications: Peripheral artery disease. High Risk Factors: Hypertension, hyperlipidemia, Diabetes, past history of                    smoking, prior CVA.  Vascular Interventions: Right CIA and left SFA stent placement 11/2011. Comparison Study: 03/2021-Right [place]; Left 1.07 Performing Technologist: Wilkie Aye RVT  Examination Guidelines: A complete evaluation includes at minimum, Doppler waveform signals and systolic blood pressure reading at the level of bilateral brachial, anterior tibial, and posterior tibial arteries, when vessel segments are accessible. Bilateral testing is considered an integral part of a complete examination. Photoelectric Plethysmograph (PPG) waveforms and toe systolic pressure readings are included as required and additional duplex testing as needed. Limited examinations for reoccurring indications may be performed as noted.  ABI Findings: +---------+------------------+-----+-----------+--------+ Right    Rt Pressure (mmHg)IndexWaveform   Comment  +---------+------------------+-----+-----------+--------+ Brachial 153                                        +---------+------------------+-----+-----------+--------+ PTA      255               1.67 multiphasic         +---------+------------------+-----+-----------+--------+ PERO     255               1.67 multiphasic         +---------+------------------+-----+-----------+--------+ DP       255                1.67 multiphasic         +---------+------------------+-----+-----------+--------+ Great Toe135               0.88 Normal              +---------+------------------+-----+-----------+--------+ +---------+------------------+-----+-----------+-------+ Left     Lt Pressure (mmHg)IndexWaveform   Comment +---------+------------------+-----+-----------+-------+ Brachial 152                                       +---------+------------------+-----+-----------+-------+ PTA      255               1.67 multiphasic        +---------+------------------+-----+-----------+-------+ PERO     255               1.67 multiphasic        +---------+------------------+-----+-----------+-------+ DP       255  1.67 multiphasic        +---------+------------------+-----+-----------+-------+ Doristine Devoid [id number]               0.74 Normal             +---------+------------------+-----+-----------+-------+ +-------+-----------+-----------+------------+------------+ ABI/TBIToday's ABIToday's TBIPrevious ABIPrevious TBI +-------+-----------+-----------+------------+------------+ Right  [place]         0.88       [place]          0.59         +-------+-----------+-----------+------------+------------+ Left   [place]         0.74       1.07        0.63         +-------+-----------+-----------+------------+------------+  Right ABIs appear essentially unchanged compared to prior study on 03/2021. Left ABIs appear increased compared to prior study on 03/2021.  Summary: Right: Resting right ankle-brachial index indicates noncompressible right lower extremity arteries. The right toe-brachial index is normal. Left: Resting left ankle-brachial index indicates noncompressible left lower extremity arteries. The left toe-brachial index is normal. *See table(s) above for measurements and observations.  Suggest follow up study in 12 months. Electronically signed by Kathlyn Sacramento MD on 04/17/2022  at 6:38:15 PM.    Final    VAS US AORTA/IVC/ILIACS  Result Date: 04/17/2022 ABDOMINAL AORTA STUDY Patient Name:  CALLISTA HOH Glendenning  Date of Exam:   04/15/2022 Medical Rec #: [id number]     Accession #:    [id number] Date of Birth: 10-Nov-1935     Patient Gender: F Patient Age:   40 years Exam Location:  [facility] Procedure:      VAS US AORTA/IVC/ILIACS Referring Phys: Roderic Palau BERRY --------------------------------------------------------------------------------  Risk Factors: Hypertension, hyperlipidemia, Diabetes, past history of smoking,               prior CVA. Vascular Interventions: Right CIA and left SFA stent placement 11/2011. Limitations: Air/bowel gas, patient discomfort and Technically difficult due to bowel gas and flash artifact.  Comparison Study: 03/2021-Atherosclerosis noted throughout bilateral iliac                   arteries. No evidence of right CIA stent stenosis. Performing Technologist: Wilkie Aye RVT  Examination Guidelines: A complete evaluation includes B-mode imaging, spectral Doppler, color Doppler, and power Doppler as needed of all accessible portions of each vessel. Bilateral testing is considered an integral part of a complete examination. Limited examinations for reoccurring indications may be performed as noted.  Abdominal Aorta Findings: +-------------+-------+----------+----------+--------+--------+---------+ Location     AP (cm)Trans (cm)PSV (cm/s)WaveformThrombusComments  +-------------+-------+----------+----------+--------+--------+---------+ Proximal                      71                                  +-------------+-------+----------+----------+--------+--------+---------+ Mid                           45                                  +-------------+-------+----------+----------+--------+--------+---------+ Distal                        46                                   +-------------+-------+----------+----------+--------+--------+---------+  RT CIA Prox                   103       biphasic                  +-------------+-------+----------+----------+--------+--------+---------+ RT CIA Mid                    108       biphasic                  +-------------+-------+----------+----------+--------+--------+---------+ RT CIA Distal                                           bowel gas +-------------+-------+----------+----------+--------+--------+---------+ RT EIA Prox                   54        biphasic                  +-------------+-------+----------+----------+--------+--------+---------+ RT EIA Mid                    64        biphasic                  +-------------+-------+----------+----------+--------+--------+---------+ RT EIA Distal                 69        biphasic                  +-------------+-------+----------+----------+--------+--------+---------+ LT CIA Prox                   131       biphasic                  +-------------+-------+----------+----------+--------+--------+---------+ LT CIA Mid                    117       biphasic                  +-------------+-------+----------+----------+--------+--------+---------+ LT CIA Distal                 99        biphasic                  +-------------+-------+----------+----------+--------+--------+---------+ LT EIA Prox                                             bowel gas +-------------+-------+----------+----------+--------+--------+---------+ LT EIA Mid                                              bowel gas +-------------+-------+----------+----------+--------+--------+---------+ LT EIA Distal                 60        biphasic                  +-------------+-------+----------+----------+--------+--------+---------+ IVC/Iliac Findings: +--------+------+   IVC   Patent +--------+------+ IVC Proxpatent  +--------+------+   Summary: Stenosis: Atherosclerosis noted throughout bilateral iliac arteries. No evidence  of right CIA stent stenosis. IVC/Iliac: There is no evidence of thrombus involving the IVC.  *See table(s) above for measurements and observations. Suggest follow up study in 12 months.  Electronically signed by Kathlyn Sacramento MD on 04/17/2022 at 6:37:49 PM.    Final        Delora Fuel, MD 84/12/82 [phone]

## 2022-05-05 NOTE — ED Triage Notes (Signed)
Family noticed suspected s/s of hyper glycemia.  Patient states "I just feel awful", and some lethargy in triage. CBG 532 in triage.  Stopped metformin "a while ago" and is not on any insulin.

## 2022-05-06 LAB — URINE CULTURE: Culture: 20000 — AB

## 2022-05-06 LAB — CBG MONITORING, ED: Glucose-Capillary: 294 mg/dL — ABNORMAL HIGH (ref 70–99)

## 2022-05-06 LAB — BETA-HYDROXYBUTYRIC ACID: Beta-Hydroxybutyric Acid: 0.12 mmol/L (ref 0.05–0.27)

## 2022-05-06 MED ORDER — METFORMIN HCL 500 MG PO TABS: 500.0000 mg | ORAL_TABLET | Freq: Every day | ORAL | 0 refills | Status: DC

## 2022-05-06 MED ORDER — METFORMIN HCL 500 MG PO TABS: 500.0000 mg | ORAL_TABLET | Freq: Every day | ORAL | 0 refills | Status: AC

## 2022-05-06 MED ORDER — METFORMIN HCL 500 MG PO TABS
500.0000 mg | ORAL_TABLET | Freq: Once | ORAL | Status: AC
  Administered 2022-05-06: 500 mg via ORAL
  Filled 2022-05-06: qty 1

## 2022-05-06 MED ORDER — ACETAMINOPHEN 325 MG PO TABS
650.0000 mg | ORAL_TABLET | Freq: Once | ORAL | Status: AC
  Administered 2022-05-06: 650 mg via ORAL
  Filled 2022-05-06: qty 2

## 2022-05-06 NOTE — Discharge Instructions (Signed)
Please resume taking your metformin.  I given you a new prescription, you need to take it twice a day.  Please check your blood sugars at home.  Return if you are having any problems.

## 2022-05-06 NOTE — ED Notes (Signed)
Pt and spouse agreeable with d/c plan as discussed by provider- this nurse has verbally reinforced d/c instructions and provided pt with written copy- pt and spouse acknowledge verbal understanding- no additional questions, concerns, needs verbalized- pt escorted to exit via personal w/c by this nurse and spouse- no distress; vitals within baseline

## 2022-05-06 NOTE — ED Notes (Signed)
Nurse to bedside for toileting assistance; while assisting pt to bedside commode pt reports acute onset HA; requesting tylenol; Dr. Roxanne Mins notified of HA and request for Tylenol via secure chat

## 2022-05-07 ENCOUNTER — Telehealth (HOSPITAL_BASED_OUTPATIENT_CLINIC_OR_DEPARTMENT_OTHER): Payer: Self-pay | Admitting: *Deleted

## 2022-05-07 LAB — URINE CULTURE

## 2022-05-07 NOTE — Telephone Encounter (Signed)
Post ED Visit - Positive Culture Follow-up  Culture report reviewed by antimicrobial stewardship pharmacist: [place] Team '[]'$  Elenor Quinones, Pharm.D. '[]'$  Heide Guile, Pharm.D., BCPS AQ-ID '[]'$  Parks Neptune, Pharm.D., BCPS '[]'$  Alycia Rossetti, Pharm.D., BCPS '[]'$  Killian, Pharm.D., BCPS, AAHIVP '[]'$  Legrand Como, Pharm.D., BCPS, AAHIVP '[]'$  Salome Arnt, PharmD, BCPS '[]'$  Johnnette Gourd, PharmD, BCPS '[]'$  Hughes Better, PharmD, BCPS '[]'$  Leeroy Cha, PharmD '[]'$  Laqueta Linden, PharmD, BCPS '[x]'$  Bertis Ruddy, PharmD  [place] Team '[]'$  Leodis Sias, PharmD '[]'$  Lindell Spar, PharmD '[]'$  Royetta Asal, PharmD '[]'$  Graylin Shiver, Rph '[]'$  Rema Fendt) Glennon Mac, PharmD '[]'$  Arlyn Dunning, PharmD '[]'$  Netta Cedars, PharmD '[]'$  Dia Sitter, PharmD '[]'$  Leone Haven, PharmD '[]'$  Gretta Arab, PharmD '[]'$  Theodis Shove, PharmD '[]'$  Peggyann Juba, PharmD '[]'$  Reuel Boom, PharmD   Positive urine culture Treated with Cephalexin, no further patient follow-up is required at this time per Margarita Mail, PA  Rosie Fate 05/07/2022, 11:38 AM

## 2022-05-09 ENCOUNTER — Emergency Department (HOSPITAL_BASED_OUTPATIENT_CLINIC_OR_DEPARTMENT_OTHER)
Admission: EM | Admit: 2022-05-09 | Discharge: 2022-05-09 | Disposition: A | Discharge status: Home / Self Care | Payer: PPO | Attending: Emergency Medicine | Admitting: Emergency Medicine

## 2022-05-09 ENCOUNTER — Encounter (HOSPITAL_BASED_OUTPATIENT_CLINIC_OR_DEPARTMENT_OTHER): Payer: Self-pay | Admitting: Obstetrics and Gynecology

## 2022-05-09 ENCOUNTER — Emergency Department (HOSPITAL_BASED_OUTPATIENT_CLINIC_OR_DEPARTMENT_OTHER): Payer: PPO

## 2022-05-09 ENCOUNTER — Other Ambulatory Visit: Payer: Self-pay

## 2022-05-09 DIAGNOSIS — Z7982 Long term (current) use of aspirin: Secondary | ICD-10-CM | POA: Insufficient documentation

## 2022-05-09 DIAGNOSIS — K5900 Constipation, unspecified: Secondary | ICD-10-CM | POA: Diagnosis not present

## 2022-05-09 DIAGNOSIS — Z7984 Long term (current) use of oral hypoglycemic drugs: Secondary | ICD-10-CM | POA: Insufficient documentation

## 2022-05-09 DIAGNOSIS — Z79899 Other long term (current) drug therapy: Secondary | ICD-10-CM | POA: Insufficient documentation

## 2022-05-09 DIAGNOSIS — Z7902 Long term (current) use of antithrombotics/antiplatelets: Secondary | ICD-10-CM | POA: Insufficient documentation

## 2022-05-09 DIAGNOSIS — N200 Calculus of kidney: Secondary | ICD-10-CM | POA: Diagnosis not present

## 2022-05-09 DIAGNOSIS — E1165 Type 2 diabetes mellitus with hyperglycemia: Secondary | ICD-10-CM | POA: Diagnosis not present

## 2022-05-09 DIAGNOSIS — E871 Hypo-osmolality and hyponatremia: Secondary | ICD-10-CM | POA: Diagnosis not present

## 2022-05-09 DIAGNOSIS — R739 Hyperglycemia, unspecified: Secondary | ICD-10-CM | POA: Diagnosis present

## 2022-05-09 DIAGNOSIS — K8020 Calculus of gallbladder without cholecystitis without obstruction: Secondary | ICD-10-CM | POA: Diagnosis not present

## 2022-05-09 DIAGNOSIS — N134 Hydroureter: Secondary | ICD-10-CM | POA: Diagnosis not present

## 2022-05-09 LAB — CBC
HCT: 40.6 % (ref 36.0–46.0)
Hemoglobin: 14.4 g/dL (ref 12.0–15.0)
MCH: 32.7 pg (ref 26.0–34.0)
MCHC: 35.5 g/dL (ref 30.0–36.0)
MCV: 92.1 fL (ref 80.0–100.0)
Platelets: 179 10*3/uL (ref 150–400)
RBC: 4.41 MIL/uL (ref 3.87–5.11)
RDW: 11.7 % (ref 11.5–15.5)
WBC: 8.7 10*3/uL (ref 4.0–10.5)
nRBC: 0 % (ref 0.0–0.2)

## 2022-05-09 LAB — BASIC METABOLIC PANEL
Anion gap: 12 (ref 5–15)
Anion gap: 11 (ref 5–15)
BUN: 23 mg/dL (ref 8–23)
BUN: 21 mg/dL (ref 8–23)
CO2: 25 mmol/L (ref 22–32)
CO2: 28 mmol/L (ref 22–32)
Calcium: 9.5 mg/dL (ref 8.9–10.3)
Calcium: 9.5 mg/dL (ref 8.9–10.3)
Chloride: 89 mmol/L — ABNORMAL LOW (ref 98–111)
Chloride: 91 mmol/L — ABNORMAL LOW (ref 98–111)
Creatinine, Ser: 1.33 mg/dL — ABNORMAL HIGH (ref 0.44–1.00)
Creatinine, Ser: 1.22 mg/dL — ABNORMAL HIGH (ref 0.44–1.00)
GFR, Estimated: 39 mL/min — ABNORMAL LOW (ref >60)
GFR, Estimated: 44 mL/min — ABNORMAL LOW (ref >60)
Glucose, Bld: 409 mg/dL — ABNORMAL HIGH (ref 70–99)
Glucose, Bld: 324 mg/dL — ABNORMAL HIGH (ref 70–99)
Potassium: 4 mmol/L (ref 3.5–5.1)
Potassium: 3.7 mmol/L (ref 3.5–5.1)
Sodium: 126 mmol/L — ABNORMAL LOW (ref 135–145)
Sodium: 130 mmol/L — ABNORMAL LOW (ref 135–145)

## 2022-05-09 LAB — CBG MONITORING, ED
Glucose-Capillary: 374 mg/dL — ABNORMAL HIGH (ref 70–99)
Glucose-Capillary: 334 mg/dL — ABNORMAL HIGH (ref 70–99)

## 2022-05-09 LAB — OCCULT BLOOD X 1 CARD TO LAB, STOOL: Fecal Occult Bld: NEGATIVE

## 2022-05-09 MED ORDER — FLEET ENEMA 7-19 GM/118ML RE ENEM
1.0000 | ENEMA | Freq: Once | RECTAL | Status: AC
  Administered 2022-05-09: 1 via RECTAL
  Filled 2022-05-09: qty 1

## 2022-05-09 MED ORDER — SODIUM CHLORIDE 0.9 % IV BOLUS
500.0000 mL | Freq: Once | INTRAVENOUS | Status: AC
  Administered 2022-05-09: 500 mL via INTRAVENOUS

## 2022-05-09 MED ORDER — IOHEXOL 300 MG/ML  SOLN
100.0000 mL | Freq: Once | INTRAMUSCULAR | Status: AC | PRN
  Administered 2022-05-09: 75 mL via INTRAVENOUS

## 2022-05-09 NOTE — ED Provider Notes (Signed)
Pt's care assumed at 7pm pending ct scan.   Rectal exam,  large amount of soft stool,  Rn attempted to give enema.  Pt unable to tolerate.   Ct scan shows stool  no acute.  Pt given Iv fluids  repeat sodium is 130.   Pt discussed with Dr. Wyvonnia Dusky.  Pt advised continue miralax,  repeat enema at home if needed    Doris Carr 05/09/22 [id number]    [name], MD 05/10/22 [phone]

## 2022-05-09 NOTE — ED Notes (Signed)
Pt verbalizes understanding of discharge instructions. Opportunity for questioning and answers were provided. Pt discharged from ED to home with husband.    

## 2022-05-09 NOTE — Discharge Instructions (Signed)
Continue Miralax once a day for the next 7 days then as needed

## 2022-05-09 NOTE — ED Triage Notes (Signed)
Patient reports to the ER for hyperglycemia. Patient's husband reports her sugar was 400 at home. Patient reports she also had not had a BM in x2 days which she is concerned about. Reports she recently restarted on her metformin

## 2022-05-09 NOTE — ED Provider Notes (Signed)
[facility] Provider Note   CSN: [id number] Arrival date & time: 05/09/22  1701     History  Chief Complaint  Patient presents with   Hyperglycemia    Doris Carr is a 86 y.o. female with history of diabetes who recently started metformin 1 week ago who presents to the emergency department for evaluation of constipation and elevated blood sugars.  Husband is present and provides much of the history as well.  They states that her blood sugars have been running between the 200s and 300s.  Patient states that she is not here for the blood sugar and that she is here because she has been constipated for 1 day.  They reportedly took 2 tablets of a senna-based laxative yesterday along with a suppository as she still does not have a bowel movement.  She states she has a little associated abdominal discomfort.  Patient denies chest pain, shortness of breath, fevers, chills, nausea, vomiting, numbness and tingling.  Patient does not walk well normally at home.   Hyperglycemia Associated symptoms: abdominal pain   Associated symptoms: no chest pain, no dysuria, no fever, no nausea and no vomiting        Home Medications Prior to Admission medications   Medication Sig Start Date End Date Taking? Authorizing Provider  acetaminophen (TYLENOL) 650 MG CR tablet Take 650 mg by mouth 2 (two) times daily as needed (for arthritis pain.).    [provider]  acyclovir (ZOVIRAX) 400 MG tablet Take 400 mg by mouth 2 (two) times daily. 07/03/20   [provider]  Alpha Lipoic Acid 200 MG CAPS See admin instructions.    [provider]  amLODipine (NORVASC) 5 MG tablet Take 5 mg by mouth 2 (two) times daily.    [provider]  aspirin EC 81 MG EC tablet Take 1 tablet (81 mg total) by mouth daily. 12/15/19   Geradine Girt, DO  carvedilol (COREG) 25 MG tablet Take 1 tablet (25 mg total) by mouth 2 (two) times daily. 08/05/19   Lorretta Harp,  MD  cephALEXin (KEFLEX) 500 MG capsule Take 1 capsule (500 mg total) by mouth 2 (two) times daily for 7 days. 05/03/22 05/10/22  Leigh Aurora, DO  citalopram (CELEXA) 20 MG tablet Take 20 mg by mouth daily.      [provider]  clonazePAM (KLONOPIN) 0.5 MG tablet Take 0.25-0.5 mg by mouth 2 (two) times daily. 0.25 mg in the morning, 0.5 mg in the evening    [provider]  clopidogrel (PLAVIX) 75 MG tablet Take 1 tablet (75 mg total) by mouth daily. 01/27/20   Marcial Pacas, MD  Cyanocobalamin (VITAMIN B-12 PO) Take 1 tablet by mouth daily.    [provider]  estradiol (ESTRACE) 0.5 MG tablet Take 0.5 mg by mouth daily. 10/14/20   [provider]  Evolocumab (REPATHA SURECLICK) 803 MG/ML SOAJ INJECT '140mg'$  into THE SKIN EVERY 14 DAYS 08/31/21   Lorretta Harp, MD  furosemide (LASIX) 20 MG tablet Take 20 mg by mouth.    [provider]  l-methylfolate-B6-B12 Glade Stanford) 3-35-2 MG TABS tablet 1 tablet 06/25/15   [provider]  losartan (COZAAR) 50 MG tablet TAKE ONE TABLET BY MOUTH EVERY EVENING 07/27/21   Lorretta Harp, MD  metFORMIN (GLUCOPHAGE) 500 MG tablet Take 1 tablet (500 mg total) by mouth at bedtime. 11/03/20   Delora Fuel, MD  phenol (CHLORASEPTIC) 1.4 % LIQD Use as directed 1 spray  in the mouth or throat as needed for throat irritation / pain. 12/14/19   Geradine Girt, DO  potassium chloride (KLOR-CON) 10 MEQ tablet Take 10 mEq by mouth daily. 10/14/20   [provider]  traZODone (DESYREL) 50 MG tablet Take 50 mg by mouth at bedtime. HALF A PILL AT BEDTIME    [provider]      Allergies    Ezetimibe, Fenofibrate, Gabapentin, Hydralazine, Other, Statins, Ciprofloxacin, Codeine, Darvocet [propoxyphene n-acetaminophen], Darvon, Hydrocodone, Morphine and related, Pentazocine lactate, Potassium-containing compounds, Procaine hcl, and Sulfa drugs cross reactors    Review of Systems   Review of Systems  Constitutional:   Negative for fever.  Cardiovascular:  Negative for chest pain.  Gastrointestinal:  Positive for abdominal pain and constipation. Negative for nausea and vomiting.  Genitourinary:  Negative for dysuria and hematuria.  Musculoskeletal:  Negative for back pain.  Neurological:  Negative for headaches.    Physical Exam Updated Vital Signs BP 131/78   Pulse 73   Temp 98.3 F (36.8 C) (Oral)   Resp 12   SpO2 95%  Physical Exam Vitals and nursing note reviewed.  Constitutional:      General: She is not in acute distress.    Appearance: She is not ill-appearing.  HENT:     Head: Atraumatic.  Eyes:     Conjunctiva/sclera: Conjunctivae normal.  Cardiovascular:     Rate and Rhythm: Normal rate and regular rhythm.     Pulses: Normal pulses.     Heart sounds: No murmur heard. Pulmonary:     Effort: Pulmonary effort is normal. No respiratory distress.     Breath sounds: Normal breath sounds.  Abdominal:     General: Abdomen is flat. There is no distension.     Palpations: Abdomen is soft.     Tenderness: There is no abdominal tenderness.     Comments: Diffuse, nonfocal abdominal tenderness, abdomen is otherwise soft and nondistended  Musculoskeletal:        General: Normal range of motion.     Cervical back: Normal range of motion.  Skin:    General: Skin is warm and dry.     Capillary Refill: Capillary refill takes less than 2 seconds.  Neurological:     General: No focal deficit present.     Mental Status: She is alert.     Comments: Good straight leg raise against gravity  Psychiatric:        Mood and Affect: Mood normal.     ED Results / Procedures / Treatments   Labs (all labs ordered are listed, but only abnormal results are displayed) Labs Reviewed  BASIC METABOLIC PANEL - Abnormal; Notable for the following components:      Result Value   Sodium 126 (*)    Chloride 89 (*)    Glucose, Bld 409 (*)    Creatinine, Ser 1.33 (*)    GFR, Estimated 39 (*)    All other  components within normal limits  CBG MONITORING, ED - Abnormal; Notable for the following components:   Glucose-Capillary 374 (*)    All other components within normal limits  CBC    EKG EKG Interpretation  Date/Time:  Monday May 09 2022 17:32:21 EDT Ventricular Rate:  76 PR Interval:  146 QRS Duration: 148 QT Interval:  432 QTC Calculation: 486 R Axis:   26 Text Interpretation: Sinus rhythm Consider left atrial enlargement Left bundle branch block No significant change was found Confirmed by [name] [phone])  on 05/09/2022 6:19:50 PM  Radiology No results found.  Procedures Procedures    Medications Ordered in ED Medications  sodium chloride 0.9 % bolus 500 mL (500 mLs Intravenous New Bag/Given 05/09/22 1832)    ED Course/ Medical Decision Making/ A&P                           Medical Decision Making Amount and/or Complexity of Data Reviewed Labs: ordered. Radiology: ordered.  Risk Prescription drug management.   Social determinants of health:  Social History   Socioeconomic History   Marital status: Married    Spouse name: Not on file   Number of children: 3   Years of education: college   Highest education level: Master's degree (e.g., MA, MS, MEng, MEd, MSW, MBA)  Occupational History   Occupation: Retired  Tobacco Use   Smoking status: Former    Packs/day: 0.80    Years: 15.00    Total pack years: 12.00    Types: Cigarettes    Quit date: 11/03/1984    Years since quitting: 37.5   Smokeless tobacco: Never  Vaping Use   Vaping Use: Never used  Substance and Sexual Activity   Alcohol use: No    Alcohol/week: 0.0 standard drinks of alcohol   Drug use: No   Sexual activity: Yes    Birth control/protection: Surgical, Post-menopausal    Comment: 1st intercourse 89 yo-1 partner  Other Topics Concern   Not on file  Social History Narrative   Lives at home with her husband.   Caffeine use: 1 cup per day.   Right-handed.   Social  Determinants of Health   Financial Resource Strain: Not on file  Food Insecurity: Not on file  Transportation Needs: Not on file  Physical Activity: Not on file  Stress: Not on file  Social Connections: Not on file  Intimate Partner Violence: Not on file     Initial impression:  This patient presents to the ED for concern of constipation and hyperglycemia, this involves an extensive number of treatment options, and is a complaint that carries with it a high risk of complications and morbidity.   Differentials include DKA, HHS, constipation, SBO, abdominal infection.   Comorbidities affecting care:  Per HPI  Additional history obtained: Husband, labs and discharge papers from recent ED visits  Lab Tests  I Ordered, reviewed, and interpreted labs and EKG.  The pertinent results include:  Glucose 409, normal bicarb and anion gap Sodium 126 CBC normal  Imaging Studies ordered:  I ordered imaging studies including  Pending CT abdomen and pelvis  I independently visualized and interpreted imaging and I agree with the radiologist interpretation.   EKG: Sinus rhythm  Cardiac Monitoring:  The patient was maintained on a cardiac monitor.  I personally viewed and interpreted the cardiac monitored which showed an underlying rhythm of: sinus rhythm   Medicines ordered and prescription drug management:  I ordered medication including: 518m NS  bolus   I have reviewed the patients home medicines and have made adjustments as needed   ED Course/Re-evaluation: Well-appearing and in no acute distress.  Vitals without significant abnormality.  Abdomen is soft, nondistended and very mildly tender to palpation diffusely without focal tenderness.  Labs show glucose of 409 without DKA, however her sodium was noted to be 126 decreased from 133 4 days ago.  Given her very mild abdominal tenderness palpation and complaints of constipation, I ordered CT abdomen pelvis.  She was given 500 mL  normal saline bolus to gently replete her sodium.  Patient care handed off to [employer] who will monitor CT abdomen with likely admission for hyponatremia.  Discussed case with my attending Dr. Kingsley Callander who agrees with plan of management.  Disposition:  Hyponatremia Hyperglycemia Constipation: Plan and management as described above. Discharged home in good condition.  Final Clinical Impression(s) / ED Diagnoses Final diagnoses:  Hyponatremia  Hyperglycemia  Constipation, unspecified constipation type    Rx / DC Orders ED Discharge Orders     None         Rodena Piety 05/09/22 1919    [name], MD 05/10/22 0030

## 2022-05-19 DIAGNOSIS — K5900 Constipation, unspecified: Secondary | ICD-10-CM | POA: Diagnosis not present

## 2022-05-19 DIAGNOSIS — E1142 Type 2 diabetes mellitus with diabetic polyneuropathy: Secondary | ICD-10-CM | POA: Diagnosis not present

## 2022-05-19 DIAGNOSIS — E871 Hypo-osmolality and hyponatremia: Secondary | ICD-10-CM | POA: Diagnosis not present

## 2022-05-25 DIAGNOSIS — F0390 Unspecified dementia without behavioral disturbance: Secondary | ICD-10-CM | POA: Diagnosis not present

## 2022-05-25 DIAGNOSIS — G4700 Insomnia, unspecified: Secondary | ICD-10-CM | POA: Diagnosis not present

## 2022-05-25 DIAGNOSIS — E7800 Pure hypercholesterolemia, unspecified: Secondary | ICD-10-CM | POA: Diagnosis not present

## 2022-05-25 DIAGNOSIS — I10 Essential (primary) hypertension: Secondary | ICD-10-CM | POA: Diagnosis not present

## 2022-05-25 DIAGNOSIS — E1142 Type 2 diabetes mellitus with diabetic polyneuropathy: Secondary | ICD-10-CM | POA: Diagnosis not present

## 2022-05-25 DIAGNOSIS — N183 Chronic kidney disease, stage 3 unspecified: Secondary | ICD-10-CM | POA: Diagnosis not present

## 2022-06-23 DIAGNOSIS — F419 Anxiety disorder, unspecified: Secondary | ICD-10-CM | POA: Diagnosis not present

## 2022-06-23 DIAGNOSIS — Z6823 Body mass index (BMI) 23.0-23.9, adult: Secondary | ICD-10-CM | POA: Diagnosis not present

## 2022-06-23 DIAGNOSIS — E871 Hypo-osmolality and hyponatremia: Secondary | ICD-10-CM | POA: Diagnosis not present

## 2022-06-23 DIAGNOSIS — E1142 Type 2 diabetes mellitus with diabetic polyneuropathy: Secondary | ICD-10-CM | POA: Diagnosis not present

## 2022-06-23 DIAGNOSIS — Z23 Encounter for immunization: Secondary | ICD-10-CM | POA: Diagnosis not present

## 2022-06-23 DIAGNOSIS — N183 Chronic kidney disease, stage 3 unspecified: Secondary | ICD-10-CM | POA: Diagnosis not present

## 2022-06-23 DIAGNOSIS — F0390 Unspecified dementia without behavioral disturbance: Secondary | ICD-10-CM | POA: Diagnosis not present

## 2022-06-24 DIAGNOSIS — E7800 Pure hypercholesterolemia, unspecified: Secondary | ICD-10-CM | POA: Diagnosis not present

## 2022-06-24 DIAGNOSIS — N183 Chronic kidney disease, stage 3 unspecified: Secondary | ICD-10-CM | POA: Diagnosis not present

## 2022-06-24 DIAGNOSIS — G4700 Insomnia, unspecified: Secondary | ICD-10-CM | POA: Diagnosis not present

## 2022-06-24 DIAGNOSIS — M1990 Unspecified osteoarthritis, unspecified site: Secondary | ICD-10-CM | POA: Diagnosis not present

## 2022-06-24 DIAGNOSIS — I10 Essential (primary) hypertension: Secondary | ICD-10-CM | POA: Diagnosis not present

## 2022-06-24 DIAGNOSIS — E1142 Type 2 diabetes mellitus with diabetic polyneuropathy: Secondary | ICD-10-CM | POA: Diagnosis not present

## 2022-07-18 ENCOUNTER — Other Ambulatory Visit: Payer: Self-pay | Admitting: Cardiovascular Disease

## 2022-07-30 DIAGNOSIS — M542 Cervicalgia: Secondary | ICD-10-CM | POA: Diagnosis not present

## 2022-08-03 ENCOUNTER — Ambulatory Visit: Payer: PPO | Admitting: Orthopaedic Surgery

## 2022-08-16 ENCOUNTER — Other Ambulatory Visit: Payer: Self-pay | Admitting: Cardiovascular Disease

## 2022-09-08 DIAGNOSIS — H34232 Retinal artery branch occlusion, left eye: Secondary | ICD-10-CM | POA: Diagnosis not present

## 2022-09-08 DIAGNOSIS — H04123 Dry eye syndrome of bilateral lacrimal glands: Secondary | ICD-10-CM | POA: Diagnosis not present

## 2022-09-08 DIAGNOSIS — H40013 Open angle with borderline findings, low risk, bilateral: Secondary | ICD-10-CM | POA: Diagnosis not present

## 2022-09-08 DIAGNOSIS — Z961 Presence of intraocular lens: Secondary | ICD-10-CM | POA: Diagnosis not present

## 2022-09-15 ENCOUNTER — Other Ambulatory Visit: Payer: Self-pay | Admitting: Cardiovascular Disease

## 2022-10-11 DIAGNOSIS — R928 Other abnormal and inconclusive findings on diagnostic imaging of breast: Secondary | ICD-10-CM | POA: Diagnosis not present

## 2022-10-17 ENCOUNTER — Other Ambulatory Visit: Payer: Self-pay | Admitting: Cardiovascular Disease

## 2022-11-11 DIAGNOSIS — Z03818 Encounter for observation for suspected exposure to other biological agents ruled out: Secondary | ICD-10-CM | POA: Diagnosis not present

## 2022-11-11 DIAGNOSIS — U071 COVID-19: Secondary | ICD-10-CM | POA: Diagnosis not present

## 2022-11-11 DIAGNOSIS — R051 Acute cough: Secondary | ICD-10-CM | POA: Diagnosis not present

## 2022-12-12 DIAGNOSIS — Z6822 Body mass index (BMI) 22.0-22.9, adult: Secondary | ICD-10-CM | POA: Diagnosis not present

## 2022-12-12 DIAGNOSIS — Z0000 Encounter for general adult medical examination without abnormal findings: Secondary | ICD-10-CM | POA: Diagnosis not present

## 2022-12-16 DIAGNOSIS — E1122 Type 2 diabetes mellitus with diabetic chronic kidney disease: Secondary | ICD-10-CM | POA: Diagnosis not present

## 2022-12-16 DIAGNOSIS — E7800 Pure hypercholesterolemia, unspecified: Secondary | ICD-10-CM | POA: Diagnosis not present

## 2022-12-16 DIAGNOSIS — M1990 Unspecified osteoarthritis, unspecified site: Secondary | ICD-10-CM | POA: Diagnosis not present

## 2022-12-16 DIAGNOSIS — N1832 Chronic kidney disease, stage 3b: Secondary | ICD-10-CM | POA: Diagnosis not present

## 2022-12-16 DIAGNOSIS — E1142 Type 2 diabetes mellitus with diabetic polyneuropathy: Secondary | ICD-10-CM | POA: Diagnosis not present

## 2022-12-16 DIAGNOSIS — I10 Essential (primary) hypertension: Secondary | ICD-10-CM | POA: Diagnosis not present

## 2022-12-16 DIAGNOSIS — G4700 Insomnia, unspecified: Secondary | ICD-10-CM | POA: Diagnosis not present

## 2022-12-22 DIAGNOSIS — E1142 Type 2 diabetes mellitus with diabetic polyneuropathy: Secondary | ICD-10-CM | POA: Diagnosis not present

## 2022-12-22 DIAGNOSIS — E7800 Pure hypercholesterolemia, unspecified: Secondary | ICD-10-CM | POA: Diagnosis not present

## 2022-12-22 DIAGNOSIS — I10 Essential (primary) hypertension: Secondary | ICD-10-CM | POA: Diagnosis not present

## 2022-12-28 DIAGNOSIS — N1832 Chronic kidney disease, stage 3b: Secondary | ICD-10-CM | POA: Diagnosis not present

## 2022-12-28 DIAGNOSIS — I679 Cerebrovascular disease, unspecified: Secondary | ICD-10-CM | POA: Diagnosis not present

## 2022-12-28 DIAGNOSIS — N952 Postmenopausal atrophic vaginitis: Secondary | ICD-10-CM | POA: Diagnosis not present

## 2022-12-28 DIAGNOSIS — E7800 Pure hypercholesterolemia, unspecified: Secondary | ICD-10-CM | POA: Diagnosis not present

## 2022-12-28 DIAGNOSIS — I10 Essential (primary) hypertension: Secondary | ICD-10-CM | POA: Diagnosis not present

## 2022-12-28 DIAGNOSIS — E1142 Type 2 diabetes mellitus with diabetic polyneuropathy: Secondary | ICD-10-CM | POA: Diagnosis not present

## 2022-12-28 DIAGNOSIS — F0394 Unspecified dementia, unspecified severity, with anxiety: Secondary | ICD-10-CM | POA: Diagnosis not present

## 2022-12-28 DIAGNOSIS — E1122 Type 2 diabetes mellitus with diabetic chronic kidney disease: Secondary | ICD-10-CM | POA: Diagnosis not present

## 2022-12-28 DIAGNOSIS — Z0000 Encounter for general adult medical examination without abnormal findings: Secondary | ICD-10-CM | POA: Diagnosis not present

## 2022-12-28 DIAGNOSIS — E1151 Type 2 diabetes mellitus with diabetic peripheral angiopathy without gangrene: Secondary | ICD-10-CM | POA: Diagnosis not present

## 2022-12-28 DIAGNOSIS — G4700 Insomnia, unspecified: Secondary | ICD-10-CM | POA: Diagnosis not present

## 2023-02-16 DIAGNOSIS — I10 Essential (primary) hypertension: Secondary | ICD-10-CM | POA: Diagnosis not present

## 2023-02-16 DIAGNOSIS — G4700 Insomnia, unspecified: Secondary | ICD-10-CM | POA: Diagnosis not present

## 2023-02-16 DIAGNOSIS — E7800 Pure hypercholesterolemia, unspecified: Secondary | ICD-10-CM | POA: Diagnosis not present

## 2023-02-16 DIAGNOSIS — M1990 Unspecified osteoarthritis, unspecified site: Secondary | ICD-10-CM | POA: Diagnosis not present

## 2023-02-16 DIAGNOSIS — N1832 Chronic kidney disease, stage 3b: Secondary | ICD-10-CM | POA: Diagnosis not present

## 2023-02-16 DIAGNOSIS — E1142 Type 2 diabetes mellitus with diabetic polyneuropathy: Secondary | ICD-10-CM | POA: Diagnosis not present

## 2023-02-23 ENCOUNTER — Other Ambulatory Visit: Payer: Self-pay

## 2023-02-23 ENCOUNTER — Emergency Department (HOSPITAL_COMMUNITY)
Admission: EM | Admit: 2023-02-23 | Discharge: 2023-02-24 | Disposition: A | Discharge status: Home / Self Care | Payer: PPO | Attending: Emergency Medicine | Admitting: Emergency Medicine

## 2023-02-23 ENCOUNTER — Emergency Department (HOSPITAL_COMMUNITY): Payer: PPO

## 2023-02-23 ENCOUNTER — Encounter (HOSPITAL_COMMUNITY): Payer: Self-pay | Admitting: Emergency Medicine

## 2023-02-23 DIAGNOSIS — R531 Weakness: Secondary | ICD-10-CM

## 2023-02-23 DIAGNOSIS — Z79899 Other long term (current) drug therapy: Secondary | ICD-10-CM | POA: Insufficient documentation

## 2023-02-23 DIAGNOSIS — Z7902 Long term (current) use of antithrombotics/antiplatelets: Secondary | ICD-10-CM | POA: Diagnosis not present

## 2023-02-23 DIAGNOSIS — G319 Degenerative disease of nervous system, unspecified: Secondary | ICD-10-CM | POA: Diagnosis not present

## 2023-02-23 DIAGNOSIS — I130 Hypertensive heart and chronic kidney disease with heart failure and stage 1 through stage 4 chronic kidney disease, or unspecified chronic kidney disease: Secondary | ICD-10-CM | POA: Diagnosis not present

## 2023-02-23 DIAGNOSIS — Z7984 Long term (current) use of oral hypoglycemic drugs: Secondary | ICD-10-CM | POA: Diagnosis not present

## 2023-02-23 DIAGNOSIS — E1122 Type 2 diabetes mellitus with diabetic chronic kidney disease: Secondary | ICD-10-CM | POA: Insufficient documentation

## 2023-02-23 DIAGNOSIS — Z7982 Long term (current) use of aspirin: Secondary | ICD-10-CM | POA: Diagnosis not present

## 2023-02-23 DIAGNOSIS — R42 Dizziness and giddiness: Secondary | ICD-10-CM | POA: Diagnosis not present

## 2023-02-23 DIAGNOSIS — N183 Chronic kidney disease, stage 3 unspecified: Secondary | ICD-10-CM | POA: Diagnosis not present

## 2023-02-23 DIAGNOSIS — Z87891 Personal history of nicotine dependence: Secondary | ICD-10-CM | POA: Insufficient documentation

## 2023-02-23 DIAGNOSIS — M549 Dorsalgia, unspecified: Secondary | ICD-10-CM | POA: Diagnosis not present

## 2023-02-23 DIAGNOSIS — R4182 Altered mental status, unspecified: Secondary | ICD-10-CM | POA: Diagnosis not present

## 2023-02-23 DIAGNOSIS — I10 Essential (primary) hypertension: Secondary | ICD-10-CM | POA: Diagnosis not present

## 2023-02-23 DIAGNOSIS — I5032 Chronic diastolic (congestive) heart failure: Secondary | ICD-10-CM | POA: Diagnosis not present

## 2023-02-23 DIAGNOSIS — Z8673 Personal history of transient ischemic attack (TIA), and cerebral infarction without residual deficits: Secondary | ICD-10-CM | POA: Insufficient documentation

## 2023-02-23 LAB — COMPREHENSIVE METABOLIC PANEL
ALT: 13 U/L (ref 0–44)
AST: 25 U/L (ref 15–41)
Albumin: 3.9 g/dL (ref 3.5–5.0)
Alkaline Phosphatase: 58 U/L (ref 38–126)
Anion gap: 13 (ref 5–15)
BUN: 27 mg/dL — ABNORMAL HIGH (ref 8–23)
CO2: 27 mmol/L (ref 22–32)
Calcium: 9.8 mg/dL (ref 8.9–10.3)
Chloride: 95 mmol/L — ABNORMAL LOW (ref 98–111)
Creatinine, Ser: 1.29 mg/dL — ABNORMAL HIGH (ref 0.44–1.00)
GFR, Estimated: 41 mL/min — ABNORMAL LOW (ref >60)
Glucose, Bld: 125 mg/dL — ABNORMAL HIGH (ref 70–99)
Potassium: 3.5 mmol/L (ref 3.5–5.1)
Sodium: 135 mmol/L (ref 135–145)
Total Bilirubin: 1 mg/dL (ref 0.3–1.2)
Total Protein: 7 g/dL (ref 6.5–8.1)

## 2023-02-23 LAB — CBC WITH DIFFERENTIAL/PLATELET
Abs Immature Granulocytes: 0.02 10*3/uL (ref 0.00–0.07)
Basophils Absolute: 0.1 10*3/uL (ref 0.0–0.1)
Basophils Relative: 1 %
Eosinophils Absolute: 0.2 10*3/uL (ref 0.0–0.5)
Eosinophils Relative: 2 %
HCT: 40.7 % (ref 36.0–46.0)
Hemoglobin: 14.2 g/dL (ref 12.0–15.0)
Immature Granulocytes: 0 %
Lymphocytes Relative: 16 %
Lymphs Abs: 1.3 10*3/uL (ref 0.7–4.0)
MCH: 32.8 pg (ref 26.0–34.0)
MCHC: 34.9 g/dL (ref 30.0–36.0)
MCV: 94 fL (ref 80.0–100.0)
Monocytes Absolute: 0.9 10*3/uL (ref 0.1–1.0)
Monocytes Relative: 11 %
Neutro Abs: 5.9 10*3/uL (ref 1.7–7.7)
Neutrophils Relative %: 70 %
Platelets: 201 10*3/uL (ref 150–400)
RBC: 4.33 MIL/uL (ref 3.87–5.11)
RDW: 11.5 % (ref 11.5–15.5)
WBC: 8.5 10*3/uL (ref 4.0–10.5)
nRBC: 0 % (ref 0.0–0.2)

## 2023-02-23 LAB — MAGNESIUM: Magnesium: 1.6 mg/dL — ABNORMAL LOW (ref 1.7–2.4)

## 2023-02-23 MED ORDER — MECLIZINE HCL 25 MG PO TABS
25.0000 mg | ORAL_TABLET | Freq: Once | ORAL | Status: DC
  Filled 2023-02-23: qty 1

## 2023-02-23 MED ORDER — SODIUM CHLORIDE 0.9 % IV BOLUS
1000.0000 mL | Freq: Once | INTRAVENOUS | Status: AC
  Administered 2023-02-23: 1000 mL via INTRAVENOUS

## 2023-02-23 MED ORDER — MAGNESIUM SULFATE 2 GM/50ML IV SOLN
2.0000 g | Freq: Once | INTRAVENOUS | Status: AC
Start: 2023-02-23 — End: 2023-02-24
  Administered 2023-02-23: 2 g via INTRAVENOUS
  Filled 2023-02-23: qty 50

## 2023-02-23 NOTE — ED Triage Notes (Addendum)
Pt bib [facility] from home c/o generalized weakness and dizziness for the past 2 days. Pt states she has been in the bed for the past 2 days due to the weakness. Endorses lower back pain.   BP 187/82, HR 70, Spo2 94%, CBG 124

## 2023-02-23 NOTE — ED Notes (Signed)
Patient transported to MRI 

## 2023-02-24 LAB — URINALYSIS, ROUTINE W REFLEX MICROSCOPIC
Bilirubin Urine: NEGATIVE
Glucose, UA: NEGATIVE mg/dL
Hgb urine dipstick: NEGATIVE
Ketones, ur: NEGATIVE mg/dL
Leukocytes,Ua: NEGATIVE
Nitrite: NEGATIVE
Protein, ur: NEGATIVE mg/dL
Specific Gravity, Urine: 1.006 (ref 1.005–1.030)
pH: 6 (ref 5.0–8.0)

## 2023-02-24 MED ORDER — MECLIZINE HCL 25 MG PO TABS: 25.0000 mg | ORAL_TABLET | Freq: Three times a day (TID) | ORAL | 0 refills | Status: AC | PRN

## 2023-02-24 MED ORDER — MECLIZINE HCL 25 MG PO TABS: 25.0000 mg | ORAL_TABLET | Freq: Three times a day (TID) | ORAL | 0 refills | Status: DC | PRN

## 2023-02-24 NOTE — ED Notes (Signed)
[facility] called  

## 2023-02-24 NOTE — ED Provider Notes (Signed)
[place] EMERGENCY DEPARTMENT AT [facility] Provider Note  CSN: [id number] Arrival date & time: 02/23/23 2122  Chief Complaint(s) Weakness  HPI Doris Carr is a 86 y.o. female with history of CKD, CHF, diabetes, hyperlipidemia, hypertension, peripheral vascular disease presenting to the emergency department with dizziness.  Reports dizziness described as a spinning sensation.  She reports that it happens all the time regardless of whether she moves her head or stands up.  She has been in bed for the past 2 days due to this.  She reports also she feels generalized weakness.  No focal weakness.  No speech difficulties, vision changes, trouble with coordination, trouble ambulating, vision changes, headaches, urinary symptoms.  No nausea or vomiting.  No cough.  No chest pain or abdominal pain.  She reports mild low back pain since she has been laying in bed all day.   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 She has had renal artery doppler evaluation on two different occasions, both times with no evidence for renal artery  stenosis. She has had urinary catecholeamine collection that was unremarkable. Edema with 10 mg amlodipine.    Left bundle branch block    Leg pain 05/14/2011   Lower extremity edema    CHRONIC   Lung nodule    VATS and biopsy in 2006 that was negative for malignancy   Obesity    Peripheral vascular disease (HCC)    Lower extremity evaluation (12/12): ABI 0.79 right, 0.64 left; > 50% mid left SFA stenosis;  s/p L SFA stent and stent to R CIA 11/2011; ABI's post PTA - R 0.90, L 0.96 (TBI on R 0.51, L 0.61)   Pneumonia    multiple times in childhood   PONV (postoperative nausea and vomiting)    Pre-operative cardiovascular examination 02/03/2016   S/P cardiac catheterization    Left heart cath in 6/06 with normal coronaries, EF 60%.   Syncope and collapse    Patient Active Problem List   Diagnosis Date Noted   Atrophic vaginitis 11/30/2021   Back pain 11/30/2021   Chronic sinusitis 11/30/2021   Claudication (HCC) 11/30/2021   Drug-induced myopathy 11/30/2021   Insomnia 11/30/2021   Osteoarthritis 11/30/2021   Osteoarthritis of both hands 11/30/2021   Other seborrheic keratosis 11/30/2021   Peripheral neuropathy 11/30/2021   Polyneuropathy due to type 2 diabetes mellitus (HCC) 11/30/2021   Recurrent falls 11/30/2021   Cerebrovascular accident (CVA) (HCC) 01/27/2020  Dementia with behavioral disturbance (HCC) 01/27/2020   Carotid stenosis 12/12/2019   Acute ischemic stroke (HCC) 12/11/2019   Acute CVA (cerebrovascular accident) (HCC) 12/11/2019   Transient vision disturbance 12/10/2019   Renal insufficiency 12/10/2019   Gait abnormality 11/20/2018   Weakness 11/20/2018   Right carotid bruit 04/17/2018   Pain in right knee 04/06/2018   History of total knee arthroplasty, right 11/16/2016   Pre-operative cardiovascular examination 02/03/2016   Normal coronary arteries  02/03/2016   Fecal incontinence 05/14/2013   PVD (peripheral vascular disease) (HCC) 09/14/2011    Hyperlipidemia 09/14/2011   Diastolic CHF, chronic (HCC) 06/12/2011   Leg pain 05/14/2011   HTN (hypertension) 01/10/2011   Balance problems    Diabetes mellitus with nephropathy (HCC)    Syncope and collapse    Left bundle branch block    Chronic renal insufficiency, stage III (moderate) (HCC)    Lower extremity edema    Depression    Anxiety    Obesity    Home Medication(s) Prior to Admission medications   Medication Sig Start Date End Date Taking? Authorizing Provider  acetaminophen (TYLENOL) 650 MG CR tablet Take 650 mg by mouth 2 (two) times daily as needed (for arthritis pain.).    [provider]  acyclovir (ZOVIRAX) 400 MG tablet Take 400 mg by mouth 2 (two) times daily. 07/03/20   [provider]  Alpha Lipoic Acid 200 MG CAPS See admin instructions.    [provider]  amLODipine (NORVASC) 5 MG tablet Take 5 mg by mouth 2 (two) times daily.    [provider]  aspirin EC 81 MG EC tablet Take 1 tablet (81 mg total) by mouth daily. 12/15/19   Joseph Art, DO  carvedilol (COREG) 25 MG tablet Take 1 tablet (25 mg total) by mouth 2 (two) times daily. 08/05/19   Runell Gess, MD  citalopram (CELEXA) 20 MG tablet Take 20 mg by mouth daily.      [provider]  clonazePAM (KLONOPIN) 0.5 MG tablet Take 0.25-0.5 mg by mouth 2 (two) times daily. 0.25 mg in the morning, 0.5 mg in the evening    [provider]  clopidogrel (PLAVIX) 75 MG tablet Take 1 tablet (75 mg total) by mouth daily. 01/27/20   Levert Feinstein, MD  Cyanocobalamin (VITAMIN B-12 PO) Take 1 tablet by mouth daily.    [provider]  estradiol (ESTRACE) 0.5 MG tablet Take 0.5 mg by mouth daily. 10/14/20   [provider]  Evolocumab (REPATHA SURECLICK) 140 MG/ML SOAJ Inject 140 mg into the skin every 14 (fourteen) days. 10/17/22   Runell Gess, MD  furosemide (LASIX) 20 MG tablet Take 20 mg by mouth.    [provider]   l-methylfolate-B6-B12 Latrelle Dodrill) 3-35-2 MG TABS tablet 1 tablet 06/25/15   [provider]  losartan (COZAAR) 50 MG tablet TAKE ONE TABLET BY MOUTH EVERY EVENING 07/27/21   Runell Gess, MD  metFORMIN (GLUCOPHAGE) 500 MG tablet Take 1 tablet (500 mg total) by mouth at bedtime. 05/06/22   Dione Booze, MD  phenol (CHLORASEPTIC) 1.4 % LIQD Use as directed 1 spray in the mouth or throat as needed for throat irritation / pain. 12/14/19   Joseph Art, DO  potassium chloride (KLOR-CON) 10 MEQ tablet Take 10 mEq by mouth daily. 10/14/20   [provider]  traZODone (DESYREL) 50 MG tablet Take 50 mg by mouth at bedtime. HALF A PILL AT BEDTIME    [provider]                                                                                                                                    Past Surgical History Past Surgical History:  Procedure Laterality Date   ABDOMINAL AORTAGRAM N/A 11/09/2011   Procedure: ABDOMINAL Ronny Flurry;  Surgeon: Tonny Bollman, MD;  Location: [facility] CATH LAB;  Service: Cardiovascular;  Laterality: N/A;   abdominal aortogram  11/09/2011   ANAL RECTAL MANOMETRY N/A 05/08/2013   Procedure: ANAL RECTAL MANOMETRY;  Surgeon: Romie Levee, MD;  Location: [facility] ENDOSCOPY;  Service: Endoscopy;  Laterality: N/A;   CARDIAC CATHETERIZATION  2006   NORMAL CORONARIES   CATARACT EXTRACTION     CERVICAL CONE BIOPSY     ENDARTERECTOMY Left 12/13/2019   Procedure: ENDARTERECTOMY CAROTID;  Surgeon: Larina Earthly, MD;  Location: [facility] OR;  Service: Vascular;  Laterality: Left;   FLEXIBLE SIGMOIDOSCOPY N/A 05/08/2013   Procedure: FLEXIBLE SIGMOIDOSCOPY;  Surgeon: Romie Levee, MD;  Location: [facility] ENDOSCOPY;  Service: Endoscopy;  Laterality: N/A;  rigid proctoscope get from o.r.   GYNECOLOGIC CRYOSURGERY     Knee Surg     Arthroscopic   PATCH ANGIOPLASTY Left 12/13/2019   Procedure: Patch Angioplasty;  Surgeon: Larina Earthly, MD;  Location: [facility] OR;  Service: Vascular;  Laterality:  Left;   RECTAL ULTRASOUND N/A 05/08/2013   Procedure: RECTAL ULTRASOUND;  Surgeon: Romie Levee, MD;  Location: [facility] ENDOSCOPY;  Service: Endoscopy;  Laterality: N/A;   Stints in leg arteries     THORACOTOMY     THYMIC CYST REMOVAL     TONSILLECTOMY     TOTAL KNEE ARTHROPLASTY Right 11/16/2016   Procedure: RIGHT TOTAL KNEE ARTHROPLASTY;  Surgeon: Ranee Gosselin, MD;  Location: [facility] ORS;  Service: Orthopedics;  Laterality: Right;  block   VAGINAL HYSTERECTOMY  1985   WITH A/P REPAIR   Family History Family History  Problem Relation Age of Onset   Alzheimer's disease Mother    Hypertension Mother    Heart disease Mother    Alzheimer's disease Father    Diabetes Paternal Grandfather     Social History Social History   Tobacco Use   Smoking status: Former    Packs/day: 0.80    Years: 15.00    Additional pack years: 0.00    Total pack years: 12.00    Types: Cigarettes    Quit date: 11/03/1984    Years since quitting: 38.3   Smokeless tobacco: Never  Vaping Use   Vaping Use: Never used  Substance Use Topics   Alcohol use: No    Alcohol/week: 0.0 standard drinks of alcohol   Drug use: No   Allergies Ezetimibe, Fenofibrate, Gabapentin, Hydralazine, Other, Statins, Ciprofloxacin, Codeine, Darvocet [propoxyphene n-acetaminophen], Darvon, Hydrocodone, Morphine and codeine, Pentazocine lactate, Potassium-containing compounds, Procaine hcl, and Sulfa drugs cross reactors  Review of Systems Review of Systems  All other systems reviewed and are  negative.   Physical Exam Vital Signs  I have reviewed the triage vital signs BP (!) 147/101   Pulse 66   Temp 98.2 F (36.8 C) (Oral)   Resp 15   Ht 5\' 5"  (1.651 m)   Wt 72.1 kg   SpO2 97%   BMI 26.46 kg/m  Physical Exam Vitals and nursing note reviewed.  Constitutional:      General: She is not in acute distress.    Appearance: She is well-developed.  HENT:     Head: Normocephalic and atraumatic.     Mouth/Throat:     Mouth:  Mucous membranes are moist.  Eyes:     Pupils: Pupils are equal, round, and reactive to light.  Cardiovascular:     Rate and Rhythm: Normal rate and regular rhythm.     Heart sounds: No murmur heard. Pulmonary:     Effort: Pulmonary effort is normal. No respiratory distress.     Breath sounds: Normal breath sounds.  Abdominal:     General: Abdomen is flat.     Palpations: Abdomen is soft.     Tenderness: There is no abdominal tenderness. There is no right CVA tenderness or left CVA tenderness.  Musculoskeletal:        General: No tenderness.     Right lower leg: No edema.     Left lower leg: No edema.     Comments: No midline C/T/L spine tenderness  Skin:    General: Skin is warm and dry.  Neurological:     General: No focal deficit present.     Mental Status: She is alert. Mental status is at baseline.     Comments: Cranial nerves II through XII intact, strength 5 out of 5 in the bilateral upper and lower extremities, no sensory deficit to light touch, no dysmetria on finger-nose-finger testing  Psychiatric:        Mood and Affect: Mood normal.        Behavior: Behavior normal.     ED Results and Treatments Labs (all labs ordered are listed, but only abnormal results are displayed) Labs Reviewed  COMPREHENSIVE METABOLIC PANEL - Abnormal; Notable for the following components:      Result Value   Chloride 95 (*)    Glucose, Bld 125 (*)    BUN 27 (*)    Creatinine, Ser 1.29 (*)    GFR, Estimated 41 (*)    All other components within normal limits  MAGNESIUM - Abnormal; Notable for the following components:   Magnesium 1.6 (*)    All other components within normal limits  CBC WITH DIFFERENTIAL/PLATELET  URINALYSIS, ROUTINE W REFLEX MICROSCOPIC                                                                                                                          Radiology MR BRAIN WO CONTRAST  Result Date: 02/23/2023 CLINICAL DATA:  Altered mental status EXAM: MRI  HEAD WITHOUT CONTRAST TECHNIQUE: Multiplanar, multiecho  pulse sequences of the brain and surrounding structures were obtained without intravenous contrast. COMPARISON:  None Available. FINDINGS: Brain: No acute infarct, mass effect or extra-axial collection. No acute or chronic hemorrhage. There is confluent hyperintense T2-weighted signal within the white matter. There is advanced atrophy. The midline structures are normal. Vascular: Major flow voids are preserved. Skull and upper cervical spine: Normal calvarium and skull base. Visualized upper cervical spine and soft tissues are normal. Sinuses/Orbits:Small amount of right mastoid fluid.  Normal orbits. IMPRESSION: 1. No acute intracranial abnormality. 2. Advanced atrophy and findings of chronic small vessel ischemia. Electronically Signed   By: Deatra Robinson M.D.   On: 02/23/2023 23:54   DG Chest Port 1 View  Result Date: 02/23/2023 CLINICAL DATA:  Weakness EXAM: PORTABLE CHEST 1 VIEW COMPARISON:  Chest radiograph 05/05/2022 FINDINGS: Stable cardiomediastinal silhouette. Aortic atherosclerotic calcification. No focal consolidation, pleural effusion, or pneumothorax. No displaced rib fractures. IMPRESSION: No active disease. Calcified tortuous aorta. Electronically Signed   By: Minerva Fester M.D.   On: 02/23/2023 23:47    Pertinent labs & imaging results that were available during my care of the patient were reviewed by me and considered in my medical decision making (see MDM for details).  Medications Ordered in ED Medications  meclizine (ANTIVERT) tablet 25 mg (25 mg Oral Patient Refused/Not Given 02/23/23 2327)  sodium chloride 0.9 % bolus 1,000 mL (1,000 mLs Intravenous New Bag/Given 02/23/23 2220)  magnesium sulfate IVPB 2 g 50 mL (2 g Intravenous New Bag/Given 02/23/23 2324)                                                                                                                                     Procedures Procedures  (including  critical care time)  Medical Decision Making / ED Course   MDM:  87 year old female presenting to the emergency department with vertigo and weakness.  Patient well-appearing, physical exam reassuring with no midline back tenderness or CVA tenderness,, normal neurologic exam.  Given age and risk factors, concern for possible stroke, obtained MRI which was negative for stroke.  Suspect possible peripheral vertigo.  Will treat with meclizine.  Unclear cause of generalized weakness, could be related to immobility and deconditioning given patient has been lying in bed, suspect this is also the cause of the patient's back pain.  Laboratory testing notable for hypomagnesemia but otherwise reassuring, creatinine seems similar to baseline.  Will check urinalysis as well.  Signed out to oncoming provider pending reassessment and urinalysis.      Additional history obtained: -Additional history obtained from ems and spouse -External records from outside source obtained and reviewed including: Chart review including previous notes, labs, imaging, consultation notes including prior ER visit for hyponatremia   Lab Tests: -I ordered, reviewed, and interpreted labs.   The pertinent results include:   Labs Reviewed  COMPREHENSIVE METABOLIC PANEL - Abnormal; Notable for the following components:  Result Value   Chloride 95 (*)    Glucose, Bld 125 (*)    BUN 27 (*)    Creatinine, Ser 1.29 (*)    GFR, Estimated 41 (*)    All other components within normal limits  MAGNESIUM - Abnormal; Notable for the following components:   Magnesium 1.6 (*)    All other components within normal limits  CBC WITH DIFFERENTIAL/PLATELET  URINALYSIS, ROUTINE W REFLEX MICROSCOPIC    Notable for evidence of CKD, at basline. Mild hypomagnesemia   EKG   EKG Interpretation  Date/Time:  Thursday Feb 23 2023 21:33:06 EDT Ventricular Rate:  70 PR Interval:  144 QRS Duration: 157 QT Interval:  453 QTC  Calculation: 489 R Axis:   -7 Text Interpretation: Sinus rhythm Left bundle branch block Confirmed by Alvino Blood ([id number]) on 02/23/2023 10:06:02 PM         Imaging Studies ordered: I ordered imaging studies including MRI brain On my interpretation imaging demonstrates no acute process I independently visualized and interpreted imaging. I agree with the radiologist interpretation   Medicines ordered and prescription drug management: Meds ordered this encounter  Medications   sodium chloride 0.9 % bolus 1,000 mL   meclizine (ANTIVERT) tablet 25 mg   magnesium sulfate IVPB 2 g 50 mL    -I have reviewed the patients home medicines and have made adjustments as needed    Cardiac Monitoring: The patient was maintained on a cardiac monitor.  I personally viewed and interpreted the cardiac monitored which showed an underlying rhythm of: NSR  Social Determinants of Health:  Diagnosis or treatment significantly limited by social determinants of health: former smoker   Reevaluation: After the interventions noted above, I reevaluated the patient and found that their symptoms have stayed the same  Co morbidities that complicate the patient evaluation  Past Medical History:  Diagnosis Date   Anxiety    Arthritis    osteoarthritis   Back pain    sees a neurosurgeon   Balance problems    Balance problems    Chronic diastolic heart failure (HCC)    Echo (8/12) with EF 60-65%, moderate LV hypertrophy, mild LVH   Chronic renal insufficiency, stage III (moderate) (HCC)    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 She has had renal artery doppler evaluation on two different occasions, both times with no evidence for renal artery stenosis. She has had urinary catecholeamine collection that was unremarkable. Edema with 10 mg amlodipine.    Left bundle branch block    Leg pain 05/14/2011   Lower extremity edema    CHRONIC   Lung nodule    VATS and biopsy in 2006 that was negative for malignancy   Obesity    Peripheral vascular disease (HCC)    Lower extremity evaluation (12/12): ABI 0.79 right, 0.64 left; > 50% mid left SFA  stenosis;  s/p L SFA stent and stent to R CIA 11/2011; ABI's post PTA - R 0.90, L 0.96 (TBI on R 0.51, L 0.61)   Pneumonia    multiple times in childhood   PONV (postoperative nausea and vomiting)    Pre-operative cardiovascular examination 02/03/2016   S/P cardiac catheterization    Left heart cath in 6/06 with normal coronaries, EF 60%.   Syncope and collapse       Dispostion: Disposition decision including need for hospitalization was considered, and patient disposition pending at time of sign out.    Final Clinical Impression(s) / ED Diagnoses Final diagnoses:  Vertigo  Weakness     This chart was dictated using voice recognition software.  Despite best efforts to proofread,  errors can occur which can change the documentation meaning.    Lonell Grandchild, MD 02/24/23 [phone]

## 2023-02-24 NOTE — Discharge Instructions (Addendum)
You were seen today for generalized weakness and dizziness.  You may have had some vertigo.  He will be discharged with meclizine.  Your lab work and imaging was otherwise reassuring.

## 2023-03-02 DIAGNOSIS — G629 Polyneuropathy, unspecified: Secondary | ICD-10-CM | POA: Diagnosis not present

## 2023-03-02 DIAGNOSIS — R2689 Other abnormalities of gait and mobility: Secondary | ICD-10-CM | POA: Diagnosis not present

## 2023-03-02 DIAGNOSIS — G319 Degenerative disease of nervous system, unspecified: Secondary | ICD-10-CM | POA: Diagnosis not present

## 2023-03-02 DIAGNOSIS — R531 Weakness: Secondary | ICD-10-CM | POA: Diagnosis not present

## 2023-03-02 DIAGNOSIS — R262 Difficulty in walking, not elsewhere classified: Secondary | ICD-10-CM | POA: Diagnosis not present

## 2023-03-02 DIAGNOSIS — R42 Dizziness and giddiness: Secondary | ICD-10-CM | POA: Diagnosis not present

## 2023-03-02 DIAGNOSIS — Z7401 Bed confinement status: Secondary | ICD-10-CM | POA: Diagnosis not present

## 2023-03-02 DIAGNOSIS — F0280 Dementia in other diseases classified elsewhere without behavioral disturbance: Secondary | ICD-10-CM | POA: Diagnosis not present

## 2023-03-06 DIAGNOSIS — K5900 Constipation, unspecified: Secondary | ICD-10-CM | POA: Diagnosis not present

## 2023-03-06 DIAGNOSIS — I129 Hypertensive chronic kidney disease with stage 1 through stage 4 chronic kidney disease, or unspecified chronic kidney disease: Secondary | ICD-10-CM | POA: Diagnosis not present

## 2023-03-06 DIAGNOSIS — Z7984 Long term (current) use of oral hypoglycemic drugs: Secondary | ICD-10-CM | POA: Diagnosis not present

## 2023-03-06 DIAGNOSIS — G319 Degenerative disease of nervous system, unspecified: Secondary | ICD-10-CM | POA: Diagnosis not present

## 2023-03-06 DIAGNOSIS — Z9181 History of falling: Secondary | ICD-10-CM | POA: Diagnosis not present

## 2023-03-06 DIAGNOSIS — M19041 Primary osteoarthritis, right hand: Secondary | ICD-10-CM | POA: Diagnosis not present

## 2023-03-06 DIAGNOSIS — E1142 Type 2 diabetes mellitus with diabetic polyneuropathy: Secondary | ICD-10-CM | POA: Diagnosis not present

## 2023-03-06 DIAGNOSIS — M19042 Primary osteoarthritis, left hand: Secondary | ICD-10-CM | POA: Diagnosis not present

## 2023-03-06 DIAGNOSIS — I447 Left bundle-branch block, unspecified: Secondary | ICD-10-CM | POA: Diagnosis not present

## 2023-03-06 DIAGNOSIS — F0393 Unspecified dementia, unspecified severity, with mood disturbance: Secondary | ICD-10-CM | POA: Diagnosis not present

## 2023-03-06 DIAGNOSIS — M549 Dorsalgia, unspecified: Secondary | ICD-10-CM | POA: Diagnosis not present

## 2023-03-06 DIAGNOSIS — E1122 Type 2 diabetes mellitus with diabetic chronic kidney disease: Secondary | ICD-10-CM | POA: Diagnosis not present

## 2023-03-06 DIAGNOSIS — N1832 Chronic kidney disease, stage 3b: Secondary | ICD-10-CM | POA: Diagnosis not present

## 2023-03-06 DIAGNOSIS — N952 Postmenopausal atrophic vaginitis: Secondary | ICD-10-CM | POA: Diagnosis not present

## 2023-03-06 DIAGNOSIS — E1151 Type 2 diabetes mellitus with diabetic peripheral angiopathy without gangrene: Secondary | ICD-10-CM | POA: Diagnosis not present

## 2023-03-06 DIAGNOSIS — Z7401 Bed confinement status: Secondary | ICD-10-CM | POA: Diagnosis not present

## 2023-03-06 DIAGNOSIS — F0394 Unspecified dementia, unspecified severity, with anxiety: Secondary | ICD-10-CM | POA: Diagnosis not present

## 2023-03-06 DIAGNOSIS — Z87891 Personal history of nicotine dependence: Secondary | ICD-10-CM | POA: Diagnosis not present

## 2023-03-06 DIAGNOSIS — G720 Drug-induced myopathy: Secondary | ICD-10-CM | POA: Diagnosis not present

## 2023-03-06 DIAGNOSIS — E7800 Pure hypercholesterolemia, unspecified: Secondary | ICD-10-CM | POA: Diagnosis not present

## 2023-03-06 DIAGNOSIS — Z7902 Long term (current) use of antithrombotics/antiplatelets: Secondary | ICD-10-CM | POA: Diagnosis not present

## 2023-03-06 DIAGNOSIS — F32A Depression, unspecified: Secondary | ICD-10-CM | POA: Diagnosis not present

## 2023-03-06 DIAGNOSIS — G4700 Insomnia, unspecified: Secondary | ICD-10-CM | POA: Diagnosis not present

## 2023-03-28 DIAGNOSIS — M19041 Primary osteoarthritis, right hand: Secondary | ICD-10-CM | POA: Diagnosis not present

## 2023-03-28 DIAGNOSIS — E1142 Type 2 diabetes mellitus with diabetic polyneuropathy: Secondary | ICD-10-CM | POA: Diagnosis not present

## 2023-03-28 DIAGNOSIS — I447 Left bundle-branch block, unspecified: Secondary | ICD-10-CM | POA: Diagnosis not present

## 2023-03-28 DIAGNOSIS — F0393 Unspecified dementia, unspecified severity, with mood disturbance: Secondary | ICD-10-CM | POA: Diagnosis not present

## 2023-03-28 DIAGNOSIS — E1151 Type 2 diabetes mellitus with diabetic peripheral angiopathy without gangrene: Secondary | ICD-10-CM | POA: Diagnosis not present

## 2023-03-28 DIAGNOSIS — M19042 Primary osteoarthritis, left hand: Secondary | ICD-10-CM | POA: Diagnosis not present

## 2023-03-28 DIAGNOSIS — F0394 Unspecified dementia, unspecified severity, with anxiety: Secondary | ICD-10-CM | POA: Diagnosis not present

## 2023-03-28 DIAGNOSIS — M549 Dorsalgia, unspecified: Secondary | ICD-10-CM | POA: Diagnosis not present

## 2023-03-28 DIAGNOSIS — E1122 Type 2 diabetes mellitus with diabetic chronic kidney disease: Secondary | ICD-10-CM | POA: Diagnosis not present

## 2023-03-28 DIAGNOSIS — I129 Hypertensive chronic kidney disease with stage 1 through stage 4 chronic kidney disease, or unspecified chronic kidney disease: Secondary | ICD-10-CM | POA: Diagnosis not present

## 2023-03-28 DIAGNOSIS — G319 Degenerative disease of nervous system, unspecified: Secondary | ICD-10-CM | POA: Diagnosis not present

## 2023-04-17 DIAGNOSIS — E1122 Type 2 diabetes mellitus with diabetic chronic kidney disease: Secondary | ICD-10-CM | POA: Diagnosis not present

## 2023-04-17 DIAGNOSIS — G4700 Insomnia, unspecified: Secondary | ICD-10-CM | POA: Diagnosis not present

## 2023-04-17 DIAGNOSIS — E7800 Pure hypercholesterolemia, unspecified: Secondary | ICD-10-CM | POA: Diagnosis not present

## 2023-04-17 DIAGNOSIS — I10 Essential (primary) hypertension: Secondary | ICD-10-CM | POA: Diagnosis not present

## 2023-04-17 DIAGNOSIS — M1990 Unspecified osteoarthritis, unspecified site: Secondary | ICD-10-CM | POA: Diagnosis not present

## 2023-04-27 DIAGNOSIS — M549 Dorsalgia, unspecified: Secondary | ICD-10-CM | POA: Diagnosis not present

## 2023-04-27 DIAGNOSIS — E1142 Type 2 diabetes mellitus with diabetic polyneuropathy: Secondary | ICD-10-CM | POA: Diagnosis not present

## 2023-04-27 DIAGNOSIS — E1151 Type 2 diabetes mellitus with diabetic peripheral angiopathy without gangrene: Secondary | ICD-10-CM | POA: Diagnosis not present

## 2023-04-27 DIAGNOSIS — M19042 Primary osteoarthritis, left hand: Secondary | ICD-10-CM | POA: Diagnosis not present

## 2023-04-27 DIAGNOSIS — E1122 Type 2 diabetes mellitus with diabetic chronic kidney disease: Secondary | ICD-10-CM | POA: Diagnosis not present

## 2023-04-27 DIAGNOSIS — F0394 Unspecified dementia, unspecified severity, with anxiety: Secondary | ICD-10-CM | POA: Diagnosis not present

## 2023-04-27 DIAGNOSIS — M19041 Primary osteoarthritis, right hand: Secondary | ICD-10-CM | POA: Diagnosis not present

## 2023-04-27 DIAGNOSIS — I447 Left bundle-branch block, unspecified: Secondary | ICD-10-CM | POA: Diagnosis not present

## 2023-04-27 DIAGNOSIS — I129 Hypertensive chronic kidney disease with stage 1 through stage 4 chronic kidney disease, or unspecified chronic kidney disease: Secondary | ICD-10-CM | POA: Diagnosis not present

## 2023-04-27 DIAGNOSIS — F0393 Unspecified dementia, unspecified severity, with mood disturbance: Secondary | ICD-10-CM | POA: Diagnosis not present

## 2023-04-27 DIAGNOSIS — G319 Degenerative disease of nervous system, unspecified: Secondary | ICD-10-CM | POA: Diagnosis not present

## 2023-05-28 DIAGNOSIS — M19042 Primary osteoarthritis, left hand: Secondary | ICD-10-CM | POA: Diagnosis not present

## 2023-05-28 DIAGNOSIS — E1151 Type 2 diabetes mellitus with diabetic peripheral angiopathy without gangrene: Secondary | ICD-10-CM | POA: Diagnosis not present

## 2023-05-28 DIAGNOSIS — E1122 Type 2 diabetes mellitus with diabetic chronic kidney disease: Secondary | ICD-10-CM | POA: Diagnosis not present

## 2023-05-28 DIAGNOSIS — E1142 Type 2 diabetes mellitus with diabetic polyneuropathy: Secondary | ICD-10-CM | POA: Diagnosis not present

## 2023-05-28 DIAGNOSIS — G319 Degenerative disease of nervous system, unspecified: Secondary | ICD-10-CM | POA: Diagnosis not present

## 2023-05-28 DIAGNOSIS — F0394 Unspecified dementia, unspecified severity, with anxiety: Secondary | ICD-10-CM | POA: Diagnosis not present

## 2023-05-28 DIAGNOSIS — F0393 Unspecified dementia, unspecified severity, with mood disturbance: Secondary | ICD-10-CM | POA: Diagnosis not present

## 2023-05-28 DIAGNOSIS — I447 Left bundle-branch block, unspecified: Secondary | ICD-10-CM | POA: Diagnosis not present

## 2023-05-28 DIAGNOSIS — M549 Dorsalgia, unspecified: Secondary | ICD-10-CM | POA: Diagnosis not present

## 2023-05-28 DIAGNOSIS — I129 Hypertensive chronic kidney disease with stage 1 through stage 4 chronic kidney disease, or unspecified chronic kidney disease: Secondary | ICD-10-CM | POA: Diagnosis not present

## 2023-05-28 DIAGNOSIS — M19041 Primary osteoarthritis, right hand: Secondary | ICD-10-CM | POA: Diagnosis not present

## 2023-06-28 DIAGNOSIS — F0393 Unspecified dementia, unspecified severity, with mood disturbance: Secondary | ICD-10-CM | POA: Diagnosis not present

## 2023-06-28 DIAGNOSIS — E1122 Type 2 diabetes mellitus with diabetic chronic kidney disease: Secondary | ICD-10-CM | POA: Diagnosis not present

## 2023-06-28 DIAGNOSIS — G319 Degenerative disease of nervous system, unspecified: Secondary | ICD-10-CM | POA: Diagnosis not present

## 2023-06-28 DIAGNOSIS — E1151 Type 2 diabetes mellitus with diabetic peripheral angiopathy without gangrene: Secondary | ICD-10-CM | POA: Diagnosis not present

## 2023-06-28 DIAGNOSIS — M19041 Primary osteoarthritis, right hand: Secondary | ICD-10-CM | POA: Diagnosis not present

## 2023-06-28 DIAGNOSIS — M19042 Primary osteoarthritis, left hand: Secondary | ICD-10-CM | POA: Diagnosis not present

## 2023-06-28 DIAGNOSIS — I129 Hypertensive chronic kidney disease with stage 1 through stage 4 chronic kidney disease, or unspecified chronic kidney disease: Secondary | ICD-10-CM | POA: Diagnosis not present

## 2023-06-28 DIAGNOSIS — M549 Dorsalgia, unspecified: Secondary | ICD-10-CM | POA: Diagnosis not present

## 2023-06-28 DIAGNOSIS — I447 Left bundle-branch block, unspecified: Secondary | ICD-10-CM | POA: Diagnosis not present

## 2023-06-28 DIAGNOSIS — F0394 Unspecified dementia, unspecified severity, with anxiety: Secondary | ICD-10-CM | POA: Diagnosis not present

## 2023-06-28 DIAGNOSIS — E1142 Type 2 diabetes mellitus with diabetic polyneuropathy: Secondary | ICD-10-CM | POA: Diagnosis not present

## 2023-07-28 DIAGNOSIS — F0393 Unspecified dementia, unspecified severity, with mood disturbance: Secondary | ICD-10-CM | POA: Diagnosis not present

## 2023-07-28 DIAGNOSIS — M19042 Primary osteoarthritis, left hand: Secondary | ICD-10-CM | POA: Diagnosis not present

## 2023-07-28 DIAGNOSIS — E1142 Type 2 diabetes mellitus with diabetic polyneuropathy: Secondary | ICD-10-CM | POA: Diagnosis not present

## 2023-07-28 DIAGNOSIS — E1151 Type 2 diabetes mellitus with diabetic peripheral angiopathy without gangrene: Secondary | ICD-10-CM | POA: Diagnosis not present

## 2023-07-28 DIAGNOSIS — I129 Hypertensive chronic kidney disease with stage 1 through stage 4 chronic kidney disease, or unspecified chronic kidney disease: Secondary | ICD-10-CM | POA: Diagnosis not present

## 2023-07-28 DIAGNOSIS — G319 Degenerative disease of nervous system, unspecified: Secondary | ICD-10-CM | POA: Diagnosis not present

## 2023-07-28 DIAGNOSIS — M19041 Primary osteoarthritis, right hand: Secondary | ICD-10-CM | POA: Diagnosis not present

## 2023-07-28 DIAGNOSIS — F0394 Unspecified dementia, unspecified severity, with anxiety: Secondary | ICD-10-CM | POA: Diagnosis not present

## 2023-07-28 DIAGNOSIS — E1122 Type 2 diabetes mellitus with diabetic chronic kidney disease: Secondary | ICD-10-CM | POA: Diagnosis not present

## 2023-07-28 DIAGNOSIS — I447 Left bundle-branch block, unspecified: Secondary | ICD-10-CM | POA: Diagnosis not present

## 2023-07-28 DIAGNOSIS — M549 Dorsalgia, unspecified: Secondary | ICD-10-CM | POA: Diagnosis not present

## 2023-08-09 DIAGNOSIS — M549 Dorsalgia, unspecified: Secondary | ICD-10-CM | POA: Diagnosis not present

## 2023-08-09 DIAGNOSIS — F0390 Unspecified dementia without behavioral disturbance: Secondary | ICD-10-CM | POA: Diagnosis not present

## 2023-08-09 DIAGNOSIS — M1990 Unspecified osteoarthritis, unspecified site: Secondary | ICD-10-CM | POA: Diagnosis not present

## 2023-08-10 ENCOUNTER — Other Ambulatory Visit (HOSPITAL_COMMUNITY): Payer: Self-pay

## 2023-08-10 ENCOUNTER — Other Ambulatory Visit (HOSPITAL_BASED_OUTPATIENT_CLINIC_OR_DEPARTMENT_OTHER): Payer: Self-pay

## 2023-08-10 ENCOUNTER — Other Ambulatory Visit: Payer: Self-pay

## 2023-08-10 ENCOUNTER — Other Ambulatory Visit: Payer: Self-pay | Admitting: Cardiovascular Disease

## 2023-08-10 DIAGNOSIS — I739 Peripheral vascular disease, unspecified: Secondary | ICD-10-CM

## 2023-08-10 DIAGNOSIS — I6529 Occlusion and stenosis of unspecified carotid artery: Secondary | ICD-10-CM

## 2023-08-10 MED ORDER — METFORMIN HCL 500 MG PO TABS
500.0000 mg | ORAL_TABLET | Freq: Two times a day (BID) | ORAL | 1 refills | Status: AC
  Filled 2023-08-10: qty 180, 90d supply, fill #0
  Filled 2023-09-04 – 2023-09-08 (×2): qty 60, 30d supply, fill #0
  Filled 2023-10-06 – 2023-10-16 (×3): qty 60, 30d supply, fill #1
  Filled 2023-11-06 – 2023-11-10 (×4): qty 60, 30d supply, fill #2
  Filled 2023-11-27 – 2023-12-05 (×2): qty 60, 30d supply, fill #3
  Filled 2023-12-11 – 2024-01-03 (×2): qty 60, 30d supply, fill #4

## 2023-08-10 MED ORDER — CHLORASEPTIC 1.4 % MT LIQD
OROMUCOSAL | 0 refills | Status: AC
  Filled 2023-08-10: qty 177, 90d supply, fill #0

## 2023-08-10 MED ORDER — CARVEDILOL 25 MG PO TABS
25.0000 mg | ORAL_TABLET | Freq: Two times a day (BID) | ORAL | 1 refills | Status: AC
  Filled 2023-08-10: qty 180, 90d supply, fill #0
  Filled 2023-09-04: qty 60, 30d supply, fill #0
  Filled 2023-09-04: qty 180, 90d supply, fill #0
  Filled 2023-09-08: qty 60, 30d supply, fill #0
  Filled 2023-10-06 – 2023-10-17 (×4): qty 60, 30d supply, fill #1
  Filled 2023-11-06 – 2023-11-10 (×4): qty 60, 30d supply, fill #2
  Filled 2023-11-27 – 2023-12-05 (×2): qty 60, 30d supply, fill #3
  Filled 2023-12-11 – 2024-01-03 (×2): qty 60, 30d supply, fill #4

## 2023-08-10 MED ORDER — CLOPIDOGREL BISULFATE 75 MG PO TABS
75.0000 mg | ORAL_TABLET | Freq: Every day | ORAL | 1 refills | Status: AC
  Filled 2023-08-10: qty 90, 90d supply, fill #0
  Filled 2023-09-04 – 2023-09-08 (×2): qty 30, 30d supply, fill #0
  Filled 2023-10-06 – 2023-10-17 (×4): qty 30, 30d supply, fill #1
  Filled 2023-11-06 – 2023-11-10 (×4): qty 30, 30d supply, fill #2
  Filled 2023-11-27 – 2023-12-05 (×2): qty 30, 30d supply, fill #3
  Filled 2023-12-11 – 2024-01-03 (×2): qty 30, 30d supply, fill #4

## 2023-08-10 MED ORDER — LOSARTAN POTASSIUM 50 MG PO TABS
50.0000 mg | ORAL_TABLET | Freq: Every day | ORAL | 1 refills | Status: AC
  Filled 2023-08-10: qty 90, 90d supply, fill #0
  Filled 2023-09-04 – 2023-09-08 (×2): qty 30, 30d supply, fill #0
  Filled 2023-10-06 – 2023-10-16 (×3): qty 30, 30d supply, fill #1
  Filled 2023-11-06 – 2023-11-10 (×4): qty 30, 30d supply, fill #2
  Filled 2023-11-27 – 2023-12-05 (×2): qty 30, 30d supply, fill #3
  Filled 2023-12-11 – 2024-01-03 (×2): qty 30, 30d supply, fill #4

## 2023-08-11 ENCOUNTER — Other Ambulatory Visit (HOSPITAL_COMMUNITY): Payer: Self-pay

## 2023-08-11 MED ORDER — REPATHA SURECLICK 140 MG/ML ~~LOC~~ SOAJ
1.0000 | SUBCUTANEOUS | 0 refills | Status: AC
  Filled 2023-08-11: qty 6, 84d supply, fill #0
  Filled 2023-09-07: qty 2, 28d supply, fill #0
  Filled 2023-10-21: qty 6, 84d supply, fill #0

## 2023-08-15 ENCOUNTER — Other Ambulatory Visit (HOSPITAL_COMMUNITY): Payer: Self-pay

## 2023-08-28 DIAGNOSIS — M549 Dorsalgia, unspecified: Secondary | ICD-10-CM | POA: Diagnosis not present

## 2023-08-28 DIAGNOSIS — E1122 Type 2 diabetes mellitus with diabetic chronic kidney disease: Secondary | ICD-10-CM | POA: Diagnosis not present

## 2023-08-28 DIAGNOSIS — M19042 Primary osteoarthritis, left hand: Secondary | ICD-10-CM | POA: Diagnosis not present

## 2023-08-28 DIAGNOSIS — E1142 Type 2 diabetes mellitus with diabetic polyneuropathy: Secondary | ICD-10-CM | POA: Diagnosis not present

## 2023-08-28 DIAGNOSIS — F0393 Unspecified dementia, unspecified severity, with mood disturbance: Secondary | ICD-10-CM | POA: Diagnosis not present

## 2023-08-28 DIAGNOSIS — F0394 Unspecified dementia, unspecified severity, with anxiety: Secondary | ICD-10-CM | POA: Diagnosis not present

## 2023-08-28 DIAGNOSIS — I447 Left bundle-branch block, unspecified: Secondary | ICD-10-CM | POA: Diagnosis not present

## 2023-08-28 DIAGNOSIS — M19041 Primary osteoarthritis, right hand: Secondary | ICD-10-CM | POA: Diagnosis not present

## 2023-08-28 DIAGNOSIS — I129 Hypertensive chronic kidney disease with stage 1 through stage 4 chronic kidney disease, or unspecified chronic kidney disease: Secondary | ICD-10-CM | POA: Diagnosis not present

## 2023-08-28 DIAGNOSIS — E1151 Type 2 diabetes mellitus with diabetic peripheral angiopathy without gangrene: Secondary | ICD-10-CM | POA: Diagnosis not present

## 2023-08-28 DIAGNOSIS — G319 Degenerative disease of nervous system, unspecified: Secondary | ICD-10-CM | POA: Diagnosis not present

## 2023-09-04 ENCOUNTER — Other Ambulatory Visit (HOSPITAL_COMMUNITY): Payer: Self-pay

## 2023-09-04 ENCOUNTER — Other Ambulatory Visit: Payer: Self-pay

## 2023-09-05 ENCOUNTER — Other Ambulatory Visit: Payer: Self-pay

## 2023-09-05 ENCOUNTER — Other Ambulatory Visit (HOSPITAL_COMMUNITY): Payer: Self-pay

## 2023-09-06 ENCOUNTER — Other Ambulatory Visit: Payer: Self-pay

## 2023-09-06 ENCOUNTER — Other Ambulatory Visit (HOSPITAL_COMMUNITY): Payer: Self-pay

## 2023-09-07 ENCOUNTER — Other Ambulatory Visit (HOSPITAL_COMMUNITY): Payer: Self-pay

## 2023-09-07 ENCOUNTER — Other Ambulatory Visit: Payer: Self-pay

## 2023-09-07 MED ORDER — AMLODIPINE BESYLATE 5 MG PO TABS
5.0000 mg | ORAL_TABLET | Freq: Two times a day (BID) | ORAL | 10 refills | Status: AC
  Filled 2023-09-07 – 2023-09-08 (×2): qty 60, 30d supply, fill #0
  Filled 2023-10-06 – 2023-10-17 (×4): qty 60, 30d supply, fill #1
  Filled 2023-11-06 – 2023-11-10 (×4): qty 60, 30d supply, fill #2
  Filled 2023-11-27 – 2023-12-05 (×2): qty 60, 30d supply, fill #3
  Filled 2023-12-11 – 2024-01-30 (×4): qty 60, 30d supply, fill #4
  Filled 2024-02-16 – 2024-02-22 (×3): qty 60, 30d supply, fill #5
  Filled 2024-04-02: qty 60, 30d supply, fill #6

## 2023-09-07 MED ORDER — CLOPIDOGREL BISULFATE 75 MG PO TABS: 75.0000 mg | ORAL_TABLET | Freq: Every day | ORAL | 0 refills | Status: AC

## 2023-09-07 MED ORDER — ESCITALOPRAM OXALATE 10 MG PO TABS
5.0000 mg | ORAL_TABLET | Freq: Every day | ORAL | 1 refills | Status: AC
  Filled 2023-09-07 – 2023-10-16 (×9): qty 45, 90d supply, fill #0
  Filled 2023-11-06 – 2024-01-03 (×3): qty 45, 90d supply, fill #1

## 2023-09-07 MED ORDER — FUROSEMIDE 20 MG PO TABS
20.0000 mg | ORAL_TABLET | Freq: Two times a day (BID) | ORAL | 0 refills | Status: DC
  Filled 2023-09-07 – 2023-09-08 (×2): qty 60, 30d supply, fill #0
  Filled 2023-10-06 – 2023-10-16 (×3): qty 60, 30d supply, fill #1
  Filled 2023-11-06 – 2023-11-10 (×4): qty 60, 30d supply, fill #2
  Filled 2023-11-27 – 2023-12-05 (×2): qty 60, 30d supply, fill #3
  Filled 2023-12-11: qty 60, 30d supply, fill #4
  Filled ????-??-??: fill #4

## 2023-09-07 MED ORDER — REPATHA 140 MG/ML ~~LOC~~ SOSY
PREFILLED_SYRINGE | SUBCUTANEOUS | 3 refills | Status: AC
  Filled 2023-09-22: qty 2, 28d supply, fill #0

## 2023-09-07 MED ORDER — LOSARTAN POTASSIUM 50 MG PO TABS: 50.0000 mg | ORAL_TABLET | Freq: Every evening | ORAL | 0 refills | Status: AC

## 2023-09-07 MED ORDER — DONEPEZIL HCL 5 MG PO TABS
5.0000 mg | ORAL_TABLET | Freq: Every day | ORAL | 0 refills | Status: DC
  Filled 2023-09-07 – 2023-09-08 (×2): qty 30, 30d supply, fill #0
  Filled 2023-10-06 – 2023-10-16 (×3): qty 30, 30d supply, fill #1
  Filled 2023-11-06 – 2023-11-10 (×4): qty 30, 30d supply, fill #2
  Filled 2023-11-27 – 2023-12-05 (×2): qty 30, 30d supply, fill #3
  Filled 2023-12-11: qty 30, 30d supply, fill #4
  Filled ????-??-??: fill #4

## 2023-09-07 MED ORDER — CLONAZEPAM 0.5 MG PO TABS
0.5000 mg | ORAL_TABLET | Freq: Every day | ORAL | 5 refills | Status: AC | PRN
  Filled 2023-09-07: qty 30, 30d supply, fill #0

## 2023-09-07 MED ORDER — METFORMIN HCL 500 MG PO TABS: 500.0000 mg | ORAL_TABLET | Freq: Two times a day (BID) | ORAL | 0 refills | Status: AC

## 2023-09-07 MED ORDER — REPAGLINIDE 0.5 MG PO TABS
0.5000 mg | ORAL_TABLET | Freq: Two times a day (BID) | ORAL | 0 refills | Status: DC
  Filled 2023-09-07 – 2023-09-08 (×2): qty 60, 30d supply, fill #0
  Filled 2023-10-06 – 2023-10-16 (×3): qty 60, 30d supply, fill #1
  Filled 2023-11-06 – 2023-11-10 (×4): qty 60, 30d supply, fill #2
  Filled 2023-11-27 – 2023-12-08 (×3): qty 60, 30d supply, fill #3
  Filled 2023-12-11: qty 60, 30d supply, fill #4
  Filled ????-??-??: fill #4

## 2023-09-07 MED ORDER — ESTRADIOL 0.5 MG PO TABS
0.5000 mg | ORAL_TABLET | Freq: Every day | ORAL | 0 refills | Status: DC
  Filled 2023-09-07: qty 90, 90d supply, fill #0
  Filled 2023-09-22: qty 30, 30d supply, fill #0
  Filled 2023-10-23: qty 30, 30d supply, fill #1
  Filled 2023-11-21: qty 30, 30d supply, fill #2
  Filled 2023-12-21 (×2): qty 30, 30d supply, fill #3

## 2023-09-07 MED ORDER — ESCITALOPRAM OXALATE 10 MG PO TABS
5.0000 mg | ORAL_TABLET | Freq: Every day | ORAL | 0 refills | Status: AC
  Filled 2023-09-22 (×2): qty 15, 30d supply, fill #0

## 2023-09-07 MED ORDER — POTASSIUM CHLORIDE ER 10 MEQ PO TBCR
10.0000 meq | EXTENDED_RELEASE_TABLET | Freq: Two times a day (BID) | ORAL | 0 refills | Status: DC
  Filled 2023-09-07 – 2023-09-08 (×2): qty 60, 30d supply, fill #0
  Filled 2023-10-06 – 2023-10-16 (×3): qty 60, 30d supply, fill #1
  Filled 2023-11-06 – 2023-11-10 (×4): qty 60, 30d supply, fill #2
  Filled 2023-11-27 – 2023-12-08 (×4): qty 60, 30d supply, fill #3
  Filled 2023-12-11: qty 60, 30d supply, fill #4
  Filled ????-??-??: fill #4

## 2023-09-07 MED ORDER — CARVEDILOL 25 MG PO TABS: 25.0000 mg | ORAL_TABLET | Freq: Two times a day (BID) | ORAL | 0 refills | Status: AC

## 2023-09-07 MED ORDER — MEMANTINE HCL 10 MG PO TABS
10.0000 mg | ORAL_TABLET | Freq: Two times a day (BID) | ORAL | 0 refills | Status: DC
  Filled 2023-09-07 – 2023-09-08 (×2): qty 60, 30d supply, fill #0
  Filled 2023-10-06 – 2023-10-16 (×3): qty 60, 30d supply, fill #1
  Filled 2023-11-06 – 2023-11-10 (×4): qty 60, 30d supply, fill #2
  Filled 2023-11-27 – 2023-12-05 (×2): qty 60, 30d supply, fill #3
  Filled 2023-12-11: qty 60, 30d supply, fill #4
  Filled ????-??-??: fill #4

## 2023-09-08 ENCOUNTER — Other Ambulatory Visit: Payer: Self-pay

## 2023-09-08 ENCOUNTER — Other Ambulatory Visit (HOSPITAL_COMMUNITY): Payer: Self-pay

## 2023-09-18 ENCOUNTER — Other Ambulatory Visit (HOSPITAL_COMMUNITY): Payer: Self-pay

## 2023-09-18 ENCOUNTER — Other Ambulatory Visit: Payer: Self-pay

## 2023-09-19 ENCOUNTER — Other Ambulatory Visit: Payer: Self-pay

## 2023-09-19 ENCOUNTER — Other Ambulatory Visit (HOSPITAL_COMMUNITY): Payer: Self-pay

## 2023-09-20 ENCOUNTER — Other Ambulatory Visit (HOSPITAL_COMMUNITY): Payer: Self-pay

## 2023-09-20 ENCOUNTER — Other Ambulatory Visit: Payer: Self-pay

## 2023-09-21 ENCOUNTER — Other Ambulatory Visit (HOSPITAL_COMMUNITY): Payer: Self-pay

## 2023-09-22 ENCOUNTER — Other Ambulatory Visit: Payer: Self-pay

## 2023-09-22 ENCOUNTER — Other Ambulatory Visit (HOSPITAL_COMMUNITY): Payer: Self-pay

## 2023-09-23 ENCOUNTER — Other Ambulatory Visit (HOSPITAL_COMMUNITY): Payer: Self-pay

## 2023-09-27 DIAGNOSIS — E1151 Type 2 diabetes mellitus with diabetic peripheral angiopathy without gangrene: Secondary | ICD-10-CM | POA: Diagnosis not present

## 2023-09-27 DIAGNOSIS — M549 Dorsalgia, unspecified: Secondary | ICD-10-CM | POA: Diagnosis not present

## 2023-09-27 DIAGNOSIS — G319 Degenerative disease of nervous system, unspecified: Secondary | ICD-10-CM | POA: Diagnosis not present

## 2023-09-27 DIAGNOSIS — M19041 Primary osteoarthritis, right hand: Secondary | ICD-10-CM | POA: Diagnosis not present

## 2023-09-27 DIAGNOSIS — I129 Hypertensive chronic kidney disease with stage 1 through stage 4 chronic kidney disease, or unspecified chronic kidney disease: Secondary | ICD-10-CM | POA: Diagnosis not present

## 2023-09-27 DIAGNOSIS — E1122 Type 2 diabetes mellitus with diabetic chronic kidney disease: Secondary | ICD-10-CM | POA: Diagnosis not present

## 2023-09-27 DIAGNOSIS — F0394 Unspecified dementia, unspecified severity, with anxiety: Secondary | ICD-10-CM | POA: Diagnosis not present

## 2023-09-27 DIAGNOSIS — E1142 Type 2 diabetes mellitus with diabetic polyneuropathy: Secondary | ICD-10-CM | POA: Diagnosis not present

## 2023-09-27 DIAGNOSIS — F0393 Unspecified dementia, unspecified severity, with mood disturbance: Secondary | ICD-10-CM | POA: Diagnosis not present

## 2023-09-27 DIAGNOSIS — I447 Left bundle-branch block, unspecified: Secondary | ICD-10-CM | POA: Diagnosis not present

## 2023-09-27 DIAGNOSIS — M19042 Primary osteoarthritis, left hand: Secondary | ICD-10-CM | POA: Diagnosis not present

## 2023-10-06 ENCOUNTER — Other Ambulatory Visit: Payer: Self-pay

## 2023-10-11 ENCOUNTER — Other Ambulatory Visit: Payer: Self-pay

## 2023-10-16 ENCOUNTER — Other Ambulatory Visit: Payer: Self-pay

## 2023-10-16 ENCOUNTER — Other Ambulatory Visit (HOSPITAL_COMMUNITY): Payer: Self-pay

## 2023-10-17 ENCOUNTER — Other Ambulatory Visit: Payer: Self-pay

## 2023-10-21 ENCOUNTER — Other Ambulatory Visit (HOSPITAL_COMMUNITY): Payer: Self-pay

## 2023-10-23 ENCOUNTER — Other Ambulatory Visit: Payer: Self-pay

## 2023-10-23 ENCOUNTER — Other Ambulatory Visit (HOSPITAL_COMMUNITY): Payer: Self-pay

## 2023-10-26 ENCOUNTER — Other Ambulatory Visit (HOSPITAL_COMMUNITY): Payer: Self-pay

## 2023-10-27 ENCOUNTER — Other Ambulatory Visit (HOSPITAL_COMMUNITY): Payer: Self-pay

## 2023-11-02 ENCOUNTER — Telehealth: Payer: Self-pay

## 2023-11-02 ENCOUNTER — Other Ambulatory Visit (HOSPITAL_COMMUNITY): Payer: Self-pay

## 2023-11-02 NOTE — Telephone Encounter (Signed)
Pharmacy Patient Advocate Encounter   Received notification from CoverMyMeds that prior authorization for REPATHA is required/requested.   Insurance verification completed.   The patient is insured through [facility] ADVANT[employer]/RX ADVANCE .   Per test claim: Refill too soon. PA is not needed at this time. Medication was filled 10/26/23. Next eligible fill date is 12/28/23.

## 2023-11-06 ENCOUNTER — Other Ambulatory Visit: Payer: Self-pay

## 2023-11-08 ENCOUNTER — Other Ambulatory Visit: Payer: Self-pay

## 2023-11-09 ENCOUNTER — Other Ambulatory Visit: Payer: Self-pay

## 2023-11-10 ENCOUNTER — Other Ambulatory Visit: Payer: Self-pay

## 2023-11-13 ENCOUNTER — Other Ambulatory Visit (HOSPITAL_COMMUNITY): Payer: Self-pay

## 2023-11-13 ENCOUNTER — Other Ambulatory Visit: Payer: Self-pay

## 2023-11-21 ENCOUNTER — Other Ambulatory Visit (HOSPITAL_COMMUNITY): Payer: Self-pay

## 2023-11-21 ENCOUNTER — Other Ambulatory Visit: Payer: Self-pay

## 2023-11-27 ENCOUNTER — Other Ambulatory Visit: Payer: Self-pay

## 2023-12-05 ENCOUNTER — Other Ambulatory Visit: Payer: Self-pay

## 2023-12-07 ENCOUNTER — Other Ambulatory Visit (HOSPITAL_COMMUNITY): Payer: Self-pay

## 2023-12-07 ENCOUNTER — Other Ambulatory Visit: Payer: Self-pay

## 2023-12-08 ENCOUNTER — Other Ambulatory Visit (HOSPITAL_COMMUNITY): Payer: Self-pay

## 2023-12-08 ENCOUNTER — Other Ambulatory Visit: Payer: Self-pay

## 2023-12-11 ENCOUNTER — Other Ambulatory Visit: Payer: Self-pay

## 2023-12-21 ENCOUNTER — Other Ambulatory Visit (HOSPITAL_COMMUNITY): Payer: Self-pay

## 2023-12-21 ENCOUNTER — Other Ambulatory Visit: Payer: Self-pay

## 2023-12-26 DIAGNOSIS — I129 Hypertensive chronic kidney disease with stage 1 through stage 4 chronic kidney disease, or unspecified chronic kidney disease: Secondary | ICD-10-CM | POA: Diagnosis not present

## 2023-12-26 DIAGNOSIS — M549 Dorsalgia, unspecified: Secondary | ICD-10-CM | POA: Diagnosis not present

## 2023-12-26 DIAGNOSIS — M19042 Primary osteoarthritis, left hand: Secondary | ICD-10-CM | POA: Diagnosis not present

## 2023-12-26 DIAGNOSIS — F0393 Unspecified dementia, unspecified severity, with mood disturbance: Secondary | ICD-10-CM | POA: Diagnosis not present

## 2023-12-26 DIAGNOSIS — F0394 Unspecified dementia, unspecified severity, with anxiety: Secondary | ICD-10-CM | POA: Diagnosis not present

## 2023-12-26 DIAGNOSIS — I447 Left bundle-branch block, unspecified: Secondary | ICD-10-CM | POA: Diagnosis not present

## 2023-12-26 DIAGNOSIS — G319 Degenerative disease of nervous system, unspecified: Secondary | ICD-10-CM | POA: Diagnosis not present

## 2023-12-26 DIAGNOSIS — E1122 Type 2 diabetes mellitus with diabetic chronic kidney disease: Secondary | ICD-10-CM | POA: Diagnosis not present

## 2023-12-26 DIAGNOSIS — E1151 Type 2 diabetes mellitus with diabetic peripheral angiopathy without gangrene: Secondary | ICD-10-CM | POA: Diagnosis not present

## 2023-12-26 DIAGNOSIS — M19041 Primary osteoarthritis, right hand: Secondary | ICD-10-CM | POA: Diagnosis not present

## 2023-12-26 DIAGNOSIS — E1142 Type 2 diabetes mellitus with diabetic polyneuropathy: Secondary | ICD-10-CM | POA: Diagnosis not present

## 2023-12-28 ENCOUNTER — Other Ambulatory Visit (HOSPITAL_COMMUNITY): Payer: Self-pay

## 2023-12-28 DIAGNOSIS — F419 Anxiety disorder, unspecified: Secondary | ICD-10-CM | POA: Diagnosis not present

## 2023-12-28 DIAGNOSIS — G319 Degenerative disease of nervous system, unspecified: Secondary | ICD-10-CM | POA: Diagnosis not present

## 2023-12-28 DIAGNOSIS — I10 Essential (primary) hypertension: Secondary | ICD-10-CM | POA: Diagnosis not present

## 2023-12-28 DIAGNOSIS — I739 Peripheral vascular disease, unspecified: Secondary | ICD-10-CM | POA: Diagnosis not present

## 2023-12-28 DIAGNOSIS — E7800 Pure hypercholesterolemia, unspecified: Secondary | ICD-10-CM | POA: Diagnosis not present

## 2023-12-28 DIAGNOSIS — G720 Drug-induced myopathy: Secondary | ICD-10-CM | POA: Diagnosis not present

## 2023-12-28 DIAGNOSIS — G4700 Insomnia, unspecified: Secondary | ICD-10-CM | POA: Diagnosis not present

## 2023-12-28 DIAGNOSIS — N1832 Chronic kidney disease, stage 3b: Secondary | ICD-10-CM | POA: Diagnosis not present

## 2023-12-28 DIAGNOSIS — Z0000 Encounter for general adult medical examination without abnormal findings: Secondary | ICD-10-CM | POA: Diagnosis not present

## 2023-12-28 DIAGNOSIS — E1122 Type 2 diabetes mellitus with diabetic chronic kidney disease: Secondary | ICD-10-CM | POA: Diagnosis not present

## 2023-12-29 ENCOUNTER — Other Ambulatory Visit (HOSPITAL_COMMUNITY): Payer: Self-pay

## 2023-12-29 ENCOUNTER — Other Ambulatory Visit: Payer: Self-pay

## 2023-12-29 MED ORDER — DONEPEZIL HCL 5 MG PO TABS
5.0000 mg | ORAL_TABLET | Freq: Every day | ORAL | 4 refills | Status: AC
  Filled 2023-12-29: qty 90, 90d supply, fill #0
  Filled 2024-01-03: qty 30, 30d supply, fill #0
  Filled 2024-01-22 – 2024-01-30 (×2): qty 30, 30d supply, fill #1
  Filled 2024-02-16 – 2024-02-22 (×3): qty 30, 30d supply, fill #2
  Filled 2024-04-02: qty 30, 30d supply, fill #3
  Filled 2024-05-02: qty 30, 30d supply, fill #4
  Filled 2024-06-04: qty 30, 30d supply, fill #5
  Filled 2024-07-04 – 2024-07-09 (×2): qty 30, 30d supply, fill #6
  Filled 2024-07-31 – 2024-08-01 (×2): qty 30, 30d supply, fill #7
  Filled 2024-08-22 – 2024-08-26 (×2): qty 30, 30d supply, fill #8
  Filled 2024-10-06: qty 30, 30d supply, fill #9
  Filled 2024-11-05: qty 30, 30d supply, fill #10

## 2023-12-29 MED ORDER — LOSARTAN POTASSIUM 50 MG PO TABS
50.0000 mg | ORAL_TABLET | Freq: Every day | ORAL | 4 refills | Status: AC
  Filled 2023-12-29: qty 90, 90d supply, fill #0
  Filled 2024-01-03: qty 30, 30d supply, fill #0
  Filled 2024-01-22 – 2024-01-30 (×2): qty 30, 30d supply, fill #1
  Filled 2024-02-16 – 2024-02-22 (×3): qty 30, 30d supply, fill #2
  Filled 2024-04-02: qty 30, 30d supply, fill #3
  Filled 2024-05-02: qty 30, 30d supply, fill #4
  Filled 2024-06-04: qty 30, 30d supply, fill #5
  Filled 2024-07-04 – 2024-07-09 (×2): qty 30, 30d supply, fill #6
  Filled 2024-07-31 – 2024-08-01 (×2): qty 30, 30d supply, fill #7
  Filled 2024-08-22 – 2024-08-26 (×2): qty 30, 30d supply, fill #8
  Filled 2024-10-06: qty 30, 30d supply, fill #9
  Filled 2024-11-05: qty 30, 30d supply, fill #10

## 2023-12-29 MED ORDER — POTASSIUM CHLORIDE ER 10 MEQ PO TBCR
10.0000 meq | EXTENDED_RELEASE_TABLET | Freq: Two times a day (BID) | ORAL | 4 refills | Status: AC
  Filled 2023-12-29: qty 180, 90d supply, fill #0
  Filled 2024-01-03: qty 60, 30d supply, fill #0
  Filled 2024-01-22 – 2024-01-30 (×2): qty 60, 30d supply, fill #1
  Filled 2024-02-16 – 2024-02-22 (×2): qty 60, 30d supply, fill #2
  Filled 2024-04-02: qty 60, 30d supply, fill #3
  Filled 2024-05-02: qty 60, 30d supply, fill #4
  Filled 2024-06-04: qty 60, 30d supply, fill #5
  Filled 2024-07-04 – 2024-07-09 (×2): qty 60, 30d supply, fill #6
  Filled 2024-07-31 – 2024-08-01 (×2): qty 60, 30d supply, fill #7
  Filled 2024-08-22 – 2024-08-26 (×2): qty 60, 30d supply, fill #8
  Filled 2024-10-06: qty 60, 30d supply, fill #9
  Filled 2024-11-05: qty 60, 30d supply, fill #10

## 2023-12-29 MED ORDER — FUROSEMIDE 20 MG PO TABS
20.0000 mg | ORAL_TABLET | Freq: Two times a day (BID) | ORAL | 4 refills | Status: AC
  Filled 2023-12-29: qty 180, 90d supply, fill #0
  Filled 2024-01-03: qty 60, 30d supply, fill #0
  Filled 2024-01-22 – 2024-01-30 (×2): qty 60, 30d supply, fill #1
  Filled 2024-02-16 – 2024-02-22 (×3): qty 60, 30d supply, fill #2
  Filled 2024-04-02: qty 60, 30d supply, fill #3
  Filled 2024-05-02: qty 60, 30d supply, fill #4
  Filled 2024-06-04: qty 60, 30d supply, fill #5
  Filled 2024-07-04 – 2024-07-09 (×2): qty 60, 30d supply, fill #6
  Filled 2024-07-31 – 2024-08-01 (×2): qty 60, 30d supply, fill #7
  Filled 2024-08-22 – 2024-08-26 (×2): qty 60, 30d supply, fill #8
  Filled 2024-10-06: qty 60, 30d supply, fill #9
  Filled 2024-11-05: qty 60, 30d supply, fill #10

## 2023-12-29 MED ORDER — ESTRADIOL 0.5 MG PO TABS
0.5000 mg | ORAL_TABLET | Freq: Every day | ORAL | 4 refills | Status: AC
  Filled 2023-12-29: qty 90, 90d supply, fill #0
  Filled 2024-01-03: qty 30, 30d supply, fill #0
  Filled 2024-01-13 (×2): qty 90, 90d supply, fill #0
  Filled 2024-04-02: qty 30, 30d supply, fill #1
  Filled 2024-05-02: qty 30, 30d supply, fill #2
  Filled 2024-06-04: qty 30, 30d supply, fill #3
  Filled 2024-06-22 – 2024-06-27 (×2): qty 30, 30d supply, fill #4
  Filled 2024-07-26: qty 30, 30d supply, fill #5
  Filled 2024-08-22 – 2024-08-28 (×3): qty 30, 30d supply, fill #6
  Filled 2024-09-25: qty 30, 30d supply, fill #7
  Filled 2024-10-25: qty 30, 30d supply, fill #8

## 2023-12-29 MED ORDER — AMLODIPINE BESYLATE 5 MG PO TABS
5.0000 mg | ORAL_TABLET | Freq: Two times a day (BID) | ORAL | 4 refills | Status: AC
  Filled 2023-12-29: qty 180, 90d supply, fill #0
  Filled 2024-01-03: qty 60, 30d supply, fill #0
  Filled 2024-01-22 – 2024-05-02 (×3): qty 60, 30d supply, fill #1
  Filled 2024-06-04: qty 60, 30d supply, fill #2
  Filled 2024-07-04 – 2024-07-09 (×2): qty 60, 30d supply, fill #3
  Filled 2024-07-31 – 2024-08-01 (×2): qty 60, 30d supply, fill #4
  Filled 2024-08-22 – 2024-08-26 (×2): qty 60, 30d supply, fill #5
  Filled 2024-10-06: qty 60, 30d supply, fill #6
  Filled 2024-11-05: qty 60, 30d supply, fill #7
  Filled ????-??-??: fill #1

## 2023-12-29 MED ORDER — CLOPIDOGREL BISULFATE 75 MG PO TABS
75.0000 mg | ORAL_TABLET | Freq: Every day | ORAL | 4 refills | Status: AC
  Filled 2023-12-29: qty 90, 90d supply, fill #0
  Filled 2024-01-03: qty 30, 30d supply, fill #0
  Filled 2024-01-22 – 2024-01-30 (×2): qty 30, 30d supply, fill #1
  Filled 2024-02-16 – 2024-02-22 (×3): qty 30, 30d supply, fill #2
  Filled 2024-04-02: qty 30, 30d supply, fill #3
  Filled 2024-05-02: qty 30, 30d supply, fill #4
  Filled 2024-06-04: qty 30, 30d supply, fill #5
  Filled 2024-07-04 – 2024-07-09 (×2): qty 30, 30d supply, fill #6
  Filled 2024-07-31 – 2024-08-01 (×2): qty 30, 30d supply, fill #7
  Filled 2024-08-22 – 2024-08-26 (×2): qty 30, 30d supply, fill #8
  Filled 2024-10-06: qty 30, 30d supply, fill #9
  Filled 2024-11-05: qty 30, 30d supply, fill #10

## 2023-12-29 MED ORDER — MEMANTINE HCL 10 MG PO TABS
10.0000 mg | ORAL_TABLET | Freq: Two times a day (BID) | ORAL | 4 refills | Status: AC
  Filled 2023-12-29: qty 180, 90d supply, fill #0
  Filled 2024-01-03: qty 60, 30d supply, fill #0
  Filled 2024-01-22 – 2024-01-30 (×2): qty 60, 30d supply, fill #1
  Filled 2024-02-16 – 2024-02-22 (×3): qty 60, 30d supply, fill #2
  Filled 2024-04-02: qty 60, 30d supply, fill #3
  Filled 2024-05-02: qty 60, 30d supply, fill #4
  Filled 2024-06-04: qty 60, 30d supply, fill #5
  Filled 2024-07-04 – 2024-07-09 (×2): qty 60, 30d supply, fill #6
  Filled 2024-07-31 – 2024-08-01 (×2): qty 60, 30d supply, fill #7
  Filled 2024-08-22 – 2024-08-26 (×2): qty 60, 30d supply, fill #8
  Filled 2024-10-06: qty 60, 30d supply, fill #9
  Filled 2024-11-05: qty 60, 30d supply, fill #10

## 2023-12-29 MED ORDER — CLONAZEPAM 0.5 MG PO TABS
0.2500 mg | ORAL_TABLET | Freq: Every day | ORAL | 2 refills | Status: DC
  Filled 2023-12-29 – 2024-01-12 (×2): qty 30, 30d supply, fill #0
  Filled 2024-02-12: qty 30, 30d supply, fill #1
  Filled 2024-03-11: qty 30, 30d supply, fill #2

## 2023-12-29 MED ORDER — TRAZODONE HCL 50 MG PO TABS
25.0000 mg | ORAL_TABLET | Freq: Every evening | ORAL | 4 refills | Status: AC | PRN
  Filled 2023-12-29 – 2024-01-03 (×2): qty 45, 90d supply, fill #0
  Filled 2024-06-04: qty 45, 90d supply, fill #1

## 2023-12-29 MED ORDER — METFORMIN HCL 500 MG PO TABS
500.0000 mg | ORAL_TABLET | Freq: Two times a day (BID) | ORAL | 4 refills | Status: AC
  Filled 2023-12-29: qty 180, 90d supply, fill #0
  Filled 2024-01-03: qty 60, 30d supply, fill #0
  Filled 2024-01-22 – 2024-01-30 (×2): qty 60, 30d supply, fill #1
  Filled 2024-02-16 – 2024-02-22 (×3): qty 60, 30d supply, fill #2
  Filled 2024-04-02: qty 60, 30d supply, fill #3
  Filled 2024-05-02: qty 60, 30d supply, fill #4
  Filled 2024-06-04: qty 60, 30d supply, fill #5
  Filled 2024-07-04 – 2024-07-09 (×2): qty 60, 30d supply, fill #6
  Filled 2024-07-31 – 2024-08-01 (×2): qty 60, 30d supply, fill #7
  Filled 2024-08-22 – 2024-08-26 (×2): qty 60, 30d supply, fill #8
  Filled 2024-10-06: qty 60, 30d supply, fill #9
  Filled 2024-11-05: qty 60, 30d supply, fill #10

## 2023-12-29 MED ORDER — ESCITALOPRAM OXALATE 5 MG PO TABS
5.0000 mg | ORAL_TABLET | Freq: Every day | ORAL | 4 refills | Status: AC
  Filled 2023-12-29: qty 90, 90d supply, fill #0
  Filled 2024-01-03: qty 30, 30d supply, fill #0
  Filled 2024-01-22 – 2024-01-30 (×2): qty 30, 30d supply, fill #1
  Filled 2024-02-16 – 2024-02-22 (×3): qty 30, 30d supply, fill #2
  Filled 2024-04-02: qty 30, 30d supply, fill #3
  Filled 2024-05-02: qty 30, 30d supply, fill #4
  Filled 2024-06-04: qty 30, 30d supply, fill #5
  Filled 2024-07-04 – 2024-07-09 (×2): qty 30, 30d supply, fill #6
  Filled 2024-07-31 – 2024-08-01 (×2): qty 30, 30d supply, fill #7
  Filled 2024-08-22 – 2024-08-26 (×2): qty 30, 30d supply, fill #8
  Filled 2024-10-06: qty 30, 30d supply, fill #9
  Filled 2024-11-05: qty 30, 30d supply, fill #10

## 2023-12-29 MED ORDER — CARVEDILOL 25 MG PO TABS
25.0000 mg | ORAL_TABLET | Freq: Two times a day (BID) | ORAL | 4 refills | Status: AC
  Filled 2023-12-29: qty 180, 90d supply, fill #0
  Filled 2024-01-03: qty 60, 30d supply, fill #0
  Filled 2024-01-22 – 2024-01-30 (×2): qty 60, 30d supply, fill #1
  Filled 2024-02-16 – 2024-02-22 (×3): qty 60, 30d supply, fill #2
  Filled 2024-04-02: qty 60, 30d supply, fill #3
  Filled 2024-05-02: qty 60, 30d supply, fill #4
  Filled 2024-06-04: qty 60, 30d supply, fill #5
  Filled 2024-07-04 – 2024-07-09 (×2): qty 60, 30d supply, fill #6
  Filled 2024-07-31 – 2024-08-01 (×2): qty 60, 30d supply, fill #7
  Filled 2024-08-22 – 2024-08-26 (×2): qty 60, 30d supply, fill #8
  Filled 2024-10-06: qty 60, 30d supply, fill #9
  Filled 2024-11-05: qty 60, 30d supply, fill #10

## 2023-12-29 MED ORDER — REPAGLINIDE 0.5 MG PO TABS
0.5000 mg | ORAL_TABLET | Freq: Two times a day (BID) | ORAL | 4 refills | Status: AC
  Filled 2023-12-29: qty 180, 90d supply, fill #0
  Filled 2024-01-03: qty 60, 30d supply, fill #0
  Filled 2024-01-22 – 2024-01-30 (×2): qty 60, 30d supply, fill #1
  Filled 2024-02-16 – 2024-02-22 (×2): qty 60, 30d supply, fill #2
  Filled 2024-04-02: qty 60, 30d supply, fill #3
  Filled 2024-05-02: qty 60, 30d supply, fill #4
  Filled 2024-06-04: qty 60, 30d supply, fill #5
  Filled 2024-07-04 – 2024-07-09 (×2): qty 60, 30d supply, fill #6
  Filled 2024-07-31 – 2024-08-01 (×2): qty 60, 30d supply, fill #7
  Filled 2024-08-22 – 2024-08-26 (×2): qty 60, 30d supply, fill #8
  Filled 2024-10-06: qty 60, 30d supply, fill #9
  Filled 2024-11-05: qty 60, 30d supply, fill #10

## 2023-12-30 ENCOUNTER — Other Ambulatory Visit (HOSPITAL_COMMUNITY): Payer: Self-pay

## 2024-01-03 ENCOUNTER — Other Ambulatory Visit (HOSPITAL_COMMUNITY): Payer: Self-pay

## 2024-01-03 ENCOUNTER — Other Ambulatory Visit: Payer: Self-pay

## 2024-01-04 ENCOUNTER — Other Ambulatory Visit: Payer: Self-pay

## 2024-01-05 ENCOUNTER — Other Ambulatory Visit (HOSPITAL_COMMUNITY): Payer: Self-pay

## 2024-01-12 ENCOUNTER — Other Ambulatory Visit (HOSPITAL_COMMUNITY): Payer: Self-pay

## 2024-01-13 ENCOUNTER — Other Ambulatory Visit (HOSPITAL_COMMUNITY): Payer: Self-pay

## 2024-01-22 ENCOUNTER — Other Ambulatory Visit: Payer: Self-pay

## 2024-01-26 DIAGNOSIS — G319 Degenerative disease of nervous system, unspecified: Secondary | ICD-10-CM | POA: Diagnosis not present

## 2024-01-26 DIAGNOSIS — I447 Left bundle-branch block, unspecified: Secondary | ICD-10-CM | POA: Diagnosis not present

## 2024-01-26 DIAGNOSIS — E1122 Type 2 diabetes mellitus with diabetic chronic kidney disease: Secondary | ICD-10-CM | POA: Diagnosis not present

## 2024-01-26 DIAGNOSIS — E1142 Type 2 diabetes mellitus with diabetic polyneuropathy: Secondary | ICD-10-CM | POA: Diagnosis not present

## 2024-01-26 DIAGNOSIS — M19041 Primary osteoarthritis, right hand: Secondary | ICD-10-CM | POA: Diagnosis not present

## 2024-01-26 DIAGNOSIS — M19042 Primary osteoarthritis, left hand: Secondary | ICD-10-CM | POA: Diagnosis not present

## 2024-01-26 DIAGNOSIS — M549 Dorsalgia, unspecified: Secondary | ICD-10-CM | POA: Diagnosis not present

## 2024-01-26 DIAGNOSIS — I129 Hypertensive chronic kidney disease with stage 1 through stage 4 chronic kidney disease, or unspecified chronic kidney disease: Secondary | ICD-10-CM | POA: Diagnosis not present

## 2024-01-26 DIAGNOSIS — F0393 Unspecified dementia, unspecified severity, with mood disturbance: Secondary | ICD-10-CM | POA: Diagnosis not present

## 2024-01-26 DIAGNOSIS — E1151 Type 2 diabetes mellitus with diabetic peripheral angiopathy without gangrene: Secondary | ICD-10-CM | POA: Diagnosis not present

## 2024-01-26 DIAGNOSIS — F0394 Unspecified dementia, unspecified severity, with anxiety: Secondary | ICD-10-CM | POA: Diagnosis not present

## 2024-01-30 ENCOUNTER — Other Ambulatory Visit: Payer: Self-pay

## 2024-02-01 ENCOUNTER — Other Ambulatory Visit (HOSPITAL_COMMUNITY): Payer: Self-pay

## 2024-02-05 ENCOUNTER — Other Ambulatory Visit: Payer: Self-pay

## 2024-02-12 ENCOUNTER — Other Ambulatory Visit (HOSPITAL_COMMUNITY): Payer: Self-pay

## 2024-02-12 ENCOUNTER — Other Ambulatory Visit: Payer: Self-pay

## 2024-02-16 ENCOUNTER — Other Ambulatory Visit (HOSPITAL_COMMUNITY): Payer: Self-pay

## 2024-02-16 ENCOUNTER — Other Ambulatory Visit: Payer: Self-pay

## 2024-02-22 ENCOUNTER — Other Ambulatory Visit: Payer: Self-pay

## 2024-02-23 ENCOUNTER — Other Ambulatory Visit: Payer: Self-pay

## 2024-02-25 DIAGNOSIS — G319 Degenerative disease of nervous system, unspecified: Secondary | ICD-10-CM | POA: Diagnosis not present

## 2024-02-25 DIAGNOSIS — I129 Hypertensive chronic kidney disease with stage 1 through stage 4 chronic kidney disease, or unspecified chronic kidney disease: Secondary | ICD-10-CM | POA: Diagnosis not present

## 2024-02-25 DIAGNOSIS — E1151 Type 2 diabetes mellitus with diabetic peripheral angiopathy without gangrene: Secondary | ICD-10-CM | POA: Diagnosis not present

## 2024-02-25 DIAGNOSIS — E1142 Type 2 diabetes mellitus with diabetic polyneuropathy: Secondary | ICD-10-CM | POA: Diagnosis not present

## 2024-02-25 DIAGNOSIS — E1122 Type 2 diabetes mellitus with diabetic chronic kidney disease: Secondary | ICD-10-CM | POA: Diagnosis not present

## 2024-02-25 DIAGNOSIS — M549 Dorsalgia, unspecified: Secondary | ICD-10-CM | POA: Diagnosis not present

## 2024-02-25 DIAGNOSIS — M19041 Primary osteoarthritis, right hand: Secondary | ICD-10-CM | POA: Diagnosis not present

## 2024-02-25 DIAGNOSIS — I447 Left bundle-branch block, unspecified: Secondary | ICD-10-CM | POA: Diagnosis not present

## 2024-02-25 DIAGNOSIS — F0394 Unspecified dementia, unspecified severity, with anxiety: Secondary | ICD-10-CM | POA: Diagnosis not present

## 2024-02-25 DIAGNOSIS — M19042 Primary osteoarthritis, left hand: Secondary | ICD-10-CM | POA: Diagnosis not present

## 2024-02-25 DIAGNOSIS — F0393 Unspecified dementia, unspecified severity, with mood disturbance: Secondary | ICD-10-CM | POA: Diagnosis not present

## 2024-02-27 ENCOUNTER — Other Ambulatory Visit: Payer: Self-pay

## 2024-02-29 ENCOUNTER — Other Ambulatory Visit: Payer: Self-pay

## 2024-03-11 ENCOUNTER — Other Ambulatory Visit: Payer: Self-pay

## 2024-03-11 ENCOUNTER — Other Ambulatory Visit (HOSPITAL_COMMUNITY): Payer: Self-pay

## 2024-03-13 ENCOUNTER — Other Ambulatory Visit: Payer: Self-pay

## 2024-03-27 DIAGNOSIS — F0393 Unspecified dementia, unspecified severity, with mood disturbance: Secondary | ICD-10-CM | POA: Diagnosis not present

## 2024-03-27 DIAGNOSIS — E1142 Type 2 diabetes mellitus with diabetic polyneuropathy: Secondary | ICD-10-CM | POA: Diagnosis not present

## 2024-03-27 DIAGNOSIS — F0394 Unspecified dementia, unspecified severity, with anxiety: Secondary | ICD-10-CM | POA: Diagnosis not present

## 2024-03-27 DIAGNOSIS — I129 Hypertensive chronic kidney disease with stage 1 through stage 4 chronic kidney disease, or unspecified chronic kidney disease: Secondary | ICD-10-CM | POA: Diagnosis not present

## 2024-03-27 DIAGNOSIS — G319 Degenerative disease of nervous system, unspecified: Secondary | ICD-10-CM | POA: Diagnosis not present

## 2024-03-27 DIAGNOSIS — E1122 Type 2 diabetes mellitus with diabetic chronic kidney disease: Secondary | ICD-10-CM | POA: Diagnosis not present

## 2024-03-27 DIAGNOSIS — M19042 Primary osteoarthritis, left hand: Secondary | ICD-10-CM | POA: Diagnosis not present

## 2024-03-27 DIAGNOSIS — M19041 Primary osteoarthritis, right hand: Secondary | ICD-10-CM | POA: Diagnosis not present

## 2024-03-27 DIAGNOSIS — M549 Dorsalgia, unspecified: Secondary | ICD-10-CM | POA: Diagnosis not present

## 2024-03-27 DIAGNOSIS — E1151 Type 2 diabetes mellitus with diabetic peripheral angiopathy without gangrene: Secondary | ICD-10-CM | POA: Diagnosis not present

## 2024-03-27 DIAGNOSIS — I447 Left bundle-branch block, unspecified: Secondary | ICD-10-CM | POA: Diagnosis not present

## 2024-03-28 ENCOUNTER — Other Ambulatory Visit (HOSPITAL_COMMUNITY): Payer: Self-pay

## 2024-03-28 ENCOUNTER — Other Ambulatory Visit: Payer: Self-pay | Admitting: Cardiovascular Disease

## 2024-03-28 DIAGNOSIS — I739 Peripheral vascular disease, unspecified: Secondary | ICD-10-CM

## 2024-03-28 DIAGNOSIS — I6529 Occlusion and stenosis of unspecified carotid artery: Secondary | ICD-10-CM

## 2024-04-02 ENCOUNTER — Other Ambulatory Visit: Payer: Self-pay

## 2024-04-03 ENCOUNTER — Other Ambulatory Visit (HOSPITAL_COMMUNITY): Payer: Self-pay

## 2024-04-06 ENCOUNTER — Other Ambulatory Visit (HOSPITAL_COMMUNITY): Payer: Self-pay

## 2024-04-08 ENCOUNTER — Other Ambulatory Visit: Payer: Self-pay

## 2024-04-08 ENCOUNTER — Other Ambulatory Visit (HOSPITAL_COMMUNITY): Payer: Self-pay

## 2024-04-08 MED ORDER — CLONAZEPAM 0.5 MG PO TABS
0.2500 mg | ORAL_TABLET | Freq: Every day | ORAL | 2 refills | Status: DC | PRN
  Filled 2024-04-08: qty 30, 30d supply, fill #0
  Filled 2024-05-07: qty 30, 30d supply, fill #1

## 2024-04-09 ENCOUNTER — Other Ambulatory Visit: Payer: Self-pay

## 2024-04-11 ENCOUNTER — Other Ambulatory Visit (HOSPITAL_COMMUNITY): Payer: Self-pay

## 2024-04-11 DIAGNOSIS — M79671 Pain in right foot: Secondary | ICD-10-CM | POA: Diagnosis not present

## 2024-04-11 MED ORDER — DICLOFENAC SODIUM 1 % EX GEL
2.0000 g | Freq: Four times a day (QID) | CUTANEOUS | 0 refills | Status: AC
  Filled 2024-04-11: qty 100, 13d supply, fill #0

## 2024-04-12 ENCOUNTER — Other Ambulatory Visit (HOSPITAL_COMMUNITY): Payer: Self-pay

## 2024-04-12 ENCOUNTER — Ambulatory Visit (HOSPITAL_COMMUNITY)
Admission: RE | Admit: 2024-04-12 | Discharge: 2024-04-12 | Disposition: A | Discharge status: Home / Self Care | Source: Ambulatory Visit | Attending: Vascular Surgery | Admitting: Vascular Surgery

## 2024-04-12 ENCOUNTER — Other Ambulatory Visit (HOSPITAL_COMMUNITY): Payer: Self-pay | Admitting: Medical

## 2024-04-12 DIAGNOSIS — M79671 Pain in right foot: Secondary | ICD-10-CM | POA: Diagnosis not present

## 2024-04-19 ENCOUNTER — Other Ambulatory Visit (HOSPITAL_COMMUNITY): Payer: Self-pay

## 2024-04-19 ENCOUNTER — Other Ambulatory Visit: Payer: Self-pay

## 2024-04-22 DIAGNOSIS — E7800 Pure hypercholesterolemia, unspecified: Secondary | ICD-10-CM | POA: Diagnosis not present

## 2024-04-22 DIAGNOSIS — E1142 Type 2 diabetes mellitus with diabetic polyneuropathy: Secondary | ICD-10-CM | POA: Diagnosis not present

## 2024-04-25 ENCOUNTER — Other Ambulatory Visit: Payer: Self-pay

## 2024-04-30 ENCOUNTER — Other Ambulatory Visit: Payer: Self-pay

## 2024-04-30 ENCOUNTER — Other Ambulatory Visit (HOSPITAL_COMMUNITY): Payer: Self-pay

## 2024-04-30 MED ORDER — CLONAZEPAM 0.5 MG PO TABS
0.5000 mg | ORAL_TABLET | Freq: Two times a day (BID) | ORAL | 2 refills | Status: AC | PRN
  Filled 2024-04-30 – 2024-05-08 (×2): qty 60, 30d supply, fill #0
  Filled 2024-06-05: qty 60, 30d supply, fill #1

## 2024-04-30 MED ORDER — REPATHA 140 MG/ML ~~LOC~~ SOSY
140.0000 mg | PREFILLED_SYRINGE | SUBCUTANEOUS | 3 refills | Status: AC
  Filled 2024-04-30: qty 6, 84d supply, fill #0
  Filled 2024-07-12 – 2024-07-25 (×2): qty 6, 84d supply, fill #1
  Filled 2024-10-12: qty 6, 84d supply, fill #2

## 2024-05-01 ENCOUNTER — Other Ambulatory Visit: Payer: Self-pay

## 2024-05-02 ENCOUNTER — Other Ambulatory Visit: Payer: Self-pay

## 2024-05-02 ENCOUNTER — Other Ambulatory Visit (HOSPITAL_COMMUNITY): Payer: Self-pay

## 2024-05-03 ENCOUNTER — Other Ambulatory Visit: Payer: Self-pay

## 2024-05-07 ENCOUNTER — Other Ambulatory Visit (HOSPITAL_COMMUNITY): Payer: Self-pay

## 2024-05-08 ENCOUNTER — Other Ambulatory Visit (HOSPITAL_COMMUNITY): Payer: Self-pay

## 2024-05-09 ENCOUNTER — Other Ambulatory Visit: Payer: Self-pay

## 2024-05-24 ENCOUNTER — Other Ambulatory Visit: Payer: Self-pay

## 2024-06-04 ENCOUNTER — Other Ambulatory Visit: Payer: Self-pay

## 2024-06-04 ENCOUNTER — Other Ambulatory Visit (HOSPITAL_COMMUNITY): Payer: Self-pay

## 2024-06-05 ENCOUNTER — Other Ambulatory Visit: Payer: Self-pay

## 2024-06-22 ENCOUNTER — Other Ambulatory Visit (HOSPITAL_COMMUNITY): Payer: Self-pay

## 2024-06-24 ENCOUNTER — Other Ambulatory Visit: Payer: Self-pay

## 2024-07-04 ENCOUNTER — Other Ambulatory Visit (HOSPITAL_COMMUNITY): Payer: Self-pay

## 2024-07-04 ENCOUNTER — Other Ambulatory Visit: Payer: Self-pay

## 2024-07-04 DIAGNOSIS — F419 Anxiety disorder, unspecified: Secondary | ICD-10-CM | POA: Diagnosis not present

## 2024-07-04 DIAGNOSIS — I10 Essential (primary) hypertension: Secondary | ICD-10-CM | POA: Diagnosis not present

## 2024-07-04 DIAGNOSIS — E1122 Type 2 diabetes mellitus with diabetic chronic kidney disease: Secondary | ICD-10-CM | POA: Diagnosis not present

## 2024-07-04 DIAGNOSIS — Z23 Encounter for immunization: Secondary | ICD-10-CM | POA: Diagnosis not present

## 2024-07-04 DIAGNOSIS — R29898 Other symptoms and signs involving the musculoskeletal system: Secondary | ICD-10-CM | POA: Diagnosis not present

## 2024-07-04 MED ORDER — CLONAZEPAM 0.5 MG PO TABS
0.5000 mg | ORAL_TABLET | Freq: Two times a day (BID) | ORAL | 5 refills | Status: AC | PRN
  Filled 2024-07-04: qty 60, 30d supply, fill #0
  Filled 2024-08-14: qty 60, 30d supply, fill #1
  Filled 2024-09-12: qty 60, 30d supply, fill #2

## 2024-07-05 ENCOUNTER — Other Ambulatory Visit: Payer: Self-pay

## 2024-07-05 ENCOUNTER — Other Ambulatory Visit (HOSPITAL_COMMUNITY): Payer: Self-pay

## 2024-07-09 ENCOUNTER — Other Ambulatory Visit: Payer: Self-pay

## 2024-07-10 ENCOUNTER — Other Ambulatory Visit: Payer: Self-pay

## 2024-07-12 ENCOUNTER — Other Ambulatory Visit: Payer: Self-pay

## 2024-07-12 ENCOUNTER — Other Ambulatory Visit (HOSPITAL_COMMUNITY): Payer: Self-pay

## 2024-07-15 ENCOUNTER — Other Ambulatory Visit: Payer: Self-pay

## 2024-07-16 ENCOUNTER — Other Ambulatory Visit (HOSPITAL_COMMUNITY): Payer: Self-pay

## 2024-07-17 ENCOUNTER — Other Ambulatory Visit: Payer: Self-pay

## 2024-07-22 ENCOUNTER — Other Ambulatory Visit (HOSPITAL_COMMUNITY): Payer: Self-pay

## 2024-07-25 ENCOUNTER — Other Ambulatory Visit (HOSPITAL_COMMUNITY): Payer: Self-pay

## 2024-07-26 ENCOUNTER — Other Ambulatory Visit: Payer: Self-pay

## 2024-07-26 ENCOUNTER — Other Ambulatory Visit (HOSPITAL_COMMUNITY): Payer: Self-pay

## 2024-07-27 ENCOUNTER — Other Ambulatory Visit (HOSPITAL_COMMUNITY): Payer: Self-pay

## 2024-07-29 ENCOUNTER — Other Ambulatory Visit (HOSPITAL_COMMUNITY): Payer: Self-pay

## 2024-07-29 MED ORDER — TRAZODONE HCL 50 MG PO TABS
25.0000 mg | ORAL_TABLET | Freq: Every day | ORAL | 1 refills | Status: AC
  Filled 2024-07-29 – 2024-08-27 (×5): qty 45, 90d supply, fill #0

## 2024-07-31 ENCOUNTER — Other Ambulatory Visit: Payer: Self-pay

## 2024-08-01 ENCOUNTER — Other Ambulatory Visit (HOSPITAL_COMMUNITY): Payer: Self-pay

## 2024-08-01 ENCOUNTER — Other Ambulatory Visit: Payer: Self-pay

## 2024-08-07 ENCOUNTER — Other Ambulatory Visit (HOSPITAL_COMMUNITY): Payer: Self-pay

## 2024-08-14 ENCOUNTER — Other Ambulatory Visit (HOSPITAL_COMMUNITY): Payer: Self-pay

## 2024-08-14 ENCOUNTER — Other Ambulatory Visit: Payer: Self-pay

## 2024-08-15 ENCOUNTER — Other Ambulatory Visit (HOSPITAL_COMMUNITY): Payer: Self-pay

## 2024-08-20 ENCOUNTER — Other Ambulatory Visit: Payer: Self-pay

## 2024-08-22 ENCOUNTER — Other Ambulatory Visit: Payer: Self-pay

## 2024-08-26 ENCOUNTER — Other Ambulatory Visit (HOSPITAL_COMMUNITY): Payer: Self-pay

## 2024-08-27 ENCOUNTER — Other Ambulatory Visit (HOSPITAL_COMMUNITY): Payer: Self-pay

## 2024-08-27 ENCOUNTER — Other Ambulatory Visit: Payer: Self-pay

## 2024-08-28 ENCOUNTER — Other Ambulatory Visit: Payer: Self-pay

## 2024-08-28 ENCOUNTER — Other Ambulatory Visit (HOSPITAL_COMMUNITY): Payer: Self-pay

## 2024-09-02 ENCOUNTER — Other Ambulatory Visit (HOSPITAL_COMMUNITY): Payer: Self-pay

## 2024-09-12 ENCOUNTER — Other Ambulatory Visit (HOSPITAL_COMMUNITY): Payer: Self-pay

## 2024-09-12 ENCOUNTER — Other Ambulatory Visit: Payer: Self-pay

## 2024-09-13 ENCOUNTER — Other Ambulatory Visit (HOSPITAL_COMMUNITY): Payer: Self-pay

## 2024-09-16 ENCOUNTER — Other Ambulatory Visit (HOSPITAL_COMMUNITY): Payer: Self-pay

## 2024-09-16 MED ORDER — CLONAZEPAM 0.5 MG PO TABS
0.5000 mg | ORAL_TABLET | Freq: Two times a day (BID) | ORAL | 5 refills | Status: AC
  Filled 2024-09-16 – 2024-10-14 (×2): qty 62, 31d supply, fill #0

## 2024-09-17 ENCOUNTER — Other Ambulatory Visit (HOSPITAL_COMMUNITY): Payer: Self-pay

## 2024-09-25 ENCOUNTER — Other Ambulatory Visit: Payer: Self-pay

## 2024-10-02 ENCOUNTER — Other Ambulatory Visit: Payer: Self-pay

## 2024-10-07 ENCOUNTER — Other Ambulatory Visit (HOSPITAL_COMMUNITY): Payer: Self-pay

## 2024-10-07 ENCOUNTER — Other Ambulatory Visit: Payer: Self-pay

## 2024-10-08 ENCOUNTER — Other Ambulatory Visit: Payer: Self-pay

## 2024-10-09 ENCOUNTER — Other Ambulatory Visit (HOSPITAL_COMMUNITY): Payer: Self-pay

## 2024-10-11 ENCOUNTER — Other Ambulatory Visit (HOSPITAL_COMMUNITY): Payer: Self-pay | Admitting: Family Medicine

## 2024-10-11 DIAGNOSIS — K921 Melena: Secondary | ICD-10-CM

## 2024-10-11 DIAGNOSIS — N1832 Chronic kidney disease, stage 3b: Secondary | ICD-10-CM

## 2024-10-11 DIAGNOSIS — K5790 Diverticulosis of intestine, part unspecified, without perforation or abscess without bleeding: Secondary | ICD-10-CM

## 2024-10-12 ENCOUNTER — Other Ambulatory Visit (HOSPITAL_COMMUNITY): Payer: Self-pay

## 2024-10-14 ENCOUNTER — Ambulatory Visit (HOSPITAL_COMMUNITY)
Admission: RE | Admit: 2024-10-14 | Discharge: 2024-10-14 | Disposition: A | Discharge status: Home / Self Care | Source: Ambulatory Visit | Attending: Family Medicine | Admitting: Family Medicine

## 2024-10-14 ENCOUNTER — Other Ambulatory Visit: Payer: Self-pay

## 2024-10-14 ENCOUNTER — Other Ambulatory Visit (HOSPITAL_COMMUNITY): Payer: Self-pay

## 2024-10-14 DIAGNOSIS — K921 Melena: Secondary | ICD-10-CM | POA: Insufficient documentation

## 2024-10-14 DIAGNOSIS — K5790 Diverticulosis of intestine, part unspecified, without perforation or abscess without bleeding: Secondary | ICD-10-CM | POA: Insufficient documentation

## 2024-10-14 DIAGNOSIS — N1832 Chronic kidney disease, stage 3b: Secondary | ICD-10-CM | POA: Insufficient documentation

## 2024-10-15 ENCOUNTER — Other Ambulatory Visit (HOSPITAL_COMMUNITY): Payer: Self-pay

## 2024-10-17 ENCOUNTER — Other Ambulatory Visit: Payer: Self-pay

## 2024-10-17 ENCOUNTER — Other Ambulatory Visit (HOSPITAL_COMMUNITY): Payer: Self-pay

## 2024-10-17 MED ORDER — FERROUS SULFATE 325 (65 FE) MG PO TABS
325.0000 mg | ORAL_TABLET | Freq: Every day | ORAL | 3 refills | Status: AC
  Filled 2024-10-17: qty 90, 90d supply, fill #0
  Filled 2024-10-17: qty 30, 30d supply, fill #0
  Filled 2024-11-05: qty 30, 30d supply, fill #1

## 2024-10-25 ENCOUNTER — Other Ambulatory Visit (HOSPITAL_COMMUNITY): Payer: Self-pay

## 2024-11-05 ENCOUNTER — Other Ambulatory Visit: Payer: Self-pay
